# Patient Record
Sex: Female | Born: 1985
Health system: Southern US, Community
[De-identification: ages and names within clinical notes are randomized; demographics above are authoritative.]

## PROBLEM LIST (undated history)

## (undated) ENCOUNTER — Inpatient Hospital Stay (HOSPITAL_COMMUNITY): Payer: Self-pay

## (undated) DIAGNOSIS — K8020 Calculus of gallbladder without cholecystitis without obstruction: Secondary | ICD-10-CM

## (undated) DIAGNOSIS — F419 Anxiety disorder, unspecified: Secondary | ICD-10-CM

## (undated) DIAGNOSIS — O34219 Maternal care for unspecified type scar from previous cesarean delivery: Secondary | ICD-10-CM

## (undated) DIAGNOSIS — K5090 Crohn's disease, unspecified, without complications: Secondary | ICD-10-CM

## (undated) DIAGNOSIS — M2040 Other hammer toe(s) (acquired), unspecified foot: Secondary | ICD-10-CM

## (undated) DIAGNOSIS — Z98891 History of uterine scar from previous surgery: Secondary | ICD-10-CM

## (undated) DIAGNOSIS — R079 Chest pain, unspecified: Secondary | ICD-10-CM

## (undated) HISTORY — DX: Chest pain, unspecified: R07.9

## (undated) HISTORY — DX: Crohn's disease, unspecified, without complications: K50.90

---
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
## 2006-05-07 ENCOUNTER — Emergency Department (HOSPITAL_COMMUNITY): Admission: EM | Admit: 2006-05-07 | Discharge: 2006-05-08 | Payer: Self-pay | Admitting: Emergency Medicine

## 2006-05-09 ENCOUNTER — Emergency Department (HOSPITAL_COMMUNITY): Admission: EM | Admit: 2006-05-09 | Discharge: 2006-05-10 | Payer: Self-pay | Admitting: Emergency Medicine

## 2006-05-30 ENCOUNTER — Emergency Department (HOSPITAL_COMMUNITY): Admission: EM | Admit: 2006-05-30 | Discharge: 2006-05-30 | Payer: Self-pay | Admitting: Emergency Medicine

## 2006-09-07 ENCOUNTER — Emergency Department (HOSPITAL_COMMUNITY): Admission: EM | Admit: 2006-09-07 | Discharge: 2006-09-07 | Payer: Self-pay | Admitting: Emergency Medicine

## 2007-03-18 ENCOUNTER — Inpatient Hospital Stay (HOSPITAL_COMMUNITY): Admission: AD | Admit: 2007-03-18 | Discharge: 2007-03-18 | Payer: Self-pay | Admitting: Gynecology

## 2007-03-21 ENCOUNTER — Inpatient Hospital Stay (HOSPITAL_COMMUNITY): Admission: AD | Admit: 2007-03-21 | Discharge: 2007-03-21 | Payer: Self-pay | Admitting: Obstetrics & Gynecology

## 2009-06-21 ENCOUNTER — Inpatient Hospital Stay (HOSPITAL_COMMUNITY): Admission: AD | Admit: 2009-06-21 | Discharge: 2009-06-21 | Payer: Self-pay | Admitting: Obstetrics & Gynecology

## 2009-06-25 ENCOUNTER — Inpatient Hospital Stay (HOSPITAL_COMMUNITY): Admission: AD | Admit: 2009-06-25 | Discharge: 2009-06-25 | Payer: Self-pay | Admitting: Obstetrics & Gynecology

## 2009-06-25 ENCOUNTER — Emergency Department (HOSPITAL_COMMUNITY): Admission: EM | Admit: 2009-06-25 | Discharge: 2009-06-25 | Payer: Self-pay | Admitting: Emergency Medicine

## 2009-07-01 ENCOUNTER — Inpatient Hospital Stay (HOSPITAL_COMMUNITY): Admission: AD | Admit: 2009-07-01 | Discharge: 2009-07-01 | Payer: Self-pay | Admitting: Obstetrics & Gynecology

## 2009-07-08 ENCOUNTER — Inpatient Hospital Stay (HOSPITAL_COMMUNITY): Admission: AD | Admit: 2009-07-08 | Discharge: 2009-07-08 | Payer: Self-pay | Admitting: Obstetrics & Gynecology

## 2009-08-24 ENCOUNTER — Encounter: Payer: Self-pay | Admitting: Obstetrics and Gynecology

## 2009-08-24 ENCOUNTER — Ambulatory Visit: Payer: Self-pay | Admitting: Obstetrics and Gynecology

## 2009-08-24 LAB — CONVERTED CEMR LAB
Hemoglobin: 12.6 g/dL (ref 12.0–15.0)
Platelets: 268 10*3/uL (ref 150–400)
RBC: 4.37 M/uL (ref 3.87–5.11)
WBC: 6 10*3/uL (ref 4.0–10.5)
Yeast Wet Prep HPF POC: NONE SEEN
hCG, Beta Chain, Quant, S: <2 milliintl units/mL

## 2010-04-10 ENCOUNTER — Inpatient Hospital Stay (HOSPITAL_COMMUNITY): Admission: AD | Admit: 2010-04-10 | Discharge: 2010-04-10 | Payer: Self-pay | Admitting: Family Medicine

## 2010-05-01 ENCOUNTER — Inpatient Hospital Stay (HOSPITAL_COMMUNITY): Admission: AD | Admit: 2010-05-01 | Discharge: 2010-05-01 | Payer: Self-pay | Admitting: Obstetrics & Gynecology

## 2010-06-24 ENCOUNTER — Ambulatory Visit: Payer: Self-pay | Admitting: Nurse Practitioner

## 2010-06-24 ENCOUNTER — Inpatient Hospital Stay (HOSPITAL_COMMUNITY): Admission: AD | Admit: 2010-06-24 | Discharge: 2010-06-24 | Payer: Self-pay | Admitting: Obstetrics

## 2010-08-08 ENCOUNTER — Inpatient Hospital Stay (HOSPITAL_COMMUNITY): Admission: AD | Admit: 2010-08-08 | Discharge: 2010-08-08 | Payer: Self-pay | Admitting: Obstetrics

## 2010-09-02 ENCOUNTER — Inpatient Hospital Stay (HOSPITAL_COMMUNITY): Admission: AD | Admit: 2010-09-02 | Discharge: 2010-09-02 | Payer: Self-pay | Admitting: Obstetrics

## 2010-09-02 ENCOUNTER — Ambulatory Visit: Payer: Self-pay | Admitting: Gynecology

## 2010-10-09 ENCOUNTER — Inpatient Hospital Stay (HOSPITAL_COMMUNITY): Admission: AD | Admit: 2010-10-09 | Discharge: 2010-10-09 | Payer: Self-pay | Admitting: Obstetrics

## 2010-10-10 ENCOUNTER — Inpatient Hospital Stay (HOSPITAL_COMMUNITY): Admission: AD | Admit: 2010-10-10 | Discharge: 2010-10-12 | Payer: Self-pay | Admitting: Obstetrics

## 2011-02-28 LAB — CBC
HCT: 30.6 % — ABNORMAL LOW (ref 36.0–46.0)
HCT: 33.9 % — ABNORMAL LOW (ref 36.0–46.0)
Hemoglobin: 10.3 g/dL — ABNORMAL LOW (ref 12.0–15.0)
Hemoglobin: 11.5 g/dL — ABNORMAL LOW (ref 12.0–15.0)
MCH: 30.6 pg (ref 26.0–34.0)
MCH: 30.6 pg (ref 26.0–34.0)
MCHC: 33.7 g/dL (ref 30.0–36.0)
MCV: 90.3 fL (ref 78.0–100.0)
MCV: 90.8 fL (ref 78.0–100.0)
Platelets: 178 10*3/uL (ref 150–400)
Platelets: 205 10*3/uL (ref 150–400)
RBC: 3.37 MIL/uL — ABNORMAL LOW (ref 3.87–5.11)
RBC: 3.76 MIL/uL — ABNORMAL LOW (ref 3.87–5.11)
RDW: 13.2 % (ref 11.5–15.5)
WBC: 12.3 10*3/uL — ABNORMAL HIGH (ref 4.0–10.5)
WBC: 9.2 10*3/uL (ref 4.0–10.5)

## 2011-03-01 LAB — URINALYSIS, ROUTINE W REFLEX MICROSCOPIC
Glucose, UA: NEGATIVE mg/dL
Specific Gravity, Urine: 1.015 (ref 1.005–1.030)
pH: 6.5 (ref 5.0–8.0)

## 2011-03-01 LAB — GC/CHLAMYDIA PROBE AMP, GENITAL: GC Probe Amp, Genital: NEGATIVE

## 2011-03-01 LAB — WET PREP, GENITAL: Trich, Wet Prep: NONE SEEN

## 2011-03-02 LAB — URINALYSIS, ROUTINE W REFLEX MICROSCOPIC
Glucose, UA: NEGATIVE mg/dL
Ketones, ur: >80 mg/dL — AB
Protein, ur: NEGATIVE mg/dL
Urobilinogen, UA: 0.2 mg/dL (ref 0.0–1.0)
pH: 6 (ref 5.0–8.0)

## 2011-03-02 LAB — WET PREP, GENITAL
Trich, Wet Prep: NONE SEEN
Yeast Wet Prep HPF POC: NONE SEEN

## 2011-03-02 LAB — FETAL FIBRONECTIN: Fetal Fibronectin: NEGATIVE

## 2011-03-04 LAB — CBC
Hemoglobin: 10.1 g/dL — ABNORMAL LOW (ref 12.0–15.0)
MCH: 31.4 pg (ref 26.0–34.0)
MCV: 93.5 fL (ref 78.0–100.0)
RBC: 3.22 MIL/uL — ABNORMAL LOW (ref 3.87–5.11)
WBC: 8.5 10*3/uL (ref 4.0–10.5)

## 2011-03-04 LAB — URINALYSIS, ROUTINE W REFLEX MICROSCOPIC
Bilirubin Urine: NEGATIVE
Hgb urine dipstick: NEGATIVE
Ketones, ur: NEGATIVE mg/dL
Nitrite: NEGATIVE
Protein, ur: NEGATIVE mg/dL
Specific Gravity, Urine: >1.03 — ABNORMAL HIGH (ref 1.005–1.030)

## 2011-03-04 LAB — WET PREP, GENITAL: Clue Cells Wet Prep HPF POC: NONE SEEN

## 2011-03-04 LAB — GC/CHLAMYDIA PROBE AMP, GENITAL
Chlamydia, DNA Probe: NEGATIVE
GC Probe Amp, Genital: NEGATIVE

## 2011-03-05 LAB — URINALYSIS, ROUTINE W REFLEX MICROSCOPIC
Bilirubin Urine: NEGATIVE
Hgb urine dipstick: NEGATIVE
Ketones, ur: >80 mg/dL — AB
Specific Gravity, Urine: >1.03 — ABNORMAL HIGH (ref 1.005–1.030)
pH: 6 (ref 5.0–8.0)

## 2011-03-05 LAB — CBC
Hemoglobin: 11.8 g/dL — ABNORMAL LOW (ref 12.0–15.0)
MCHC: 34.6 g/dL (ref 30.0–36.0)
Platelets: 225 10*3/uL (ref 150–400)
RDW: 12.3 % (ref 11.5–15.5)

## 2011-03-05 LAB — ABO/RH: ABO/RH(D): A POS

## 2011-03-05 LAB — GC/CHLAMYDIA PROBE AMP, GENITAL: Chlamydia, DNA Probe: NEGATIVE

## 2011-03-05 LAB — WET PREP, GENITAL

## 2011-03-06 LAB — CBC
Hemoglobin: 11.9 g/dL — ABNORMAL LOW (ref 12.0–15.0)
MCV: 92.1 fL (ref 78.0–100.0)
RBC: 3.86 MIL/uL — ABNORMAL LOW (ref 3.87–5.11)
WBC: 8.6 10*3/uL (ref 4.0–10.5)

## 2011-03-06 LAB — URINALYSIS, ROUTINE W REFLEX MICROSCOPIC
Hgb urine dipstick: NEGATIVE
Nitrite: NEGATIVE
Protein, ur: NEGATIVE mg/dL
Specific Gravity, Urine: 1.01 (ref 1.005–1.030)
Urobilinogen, UA: 0.2 mg/dL (ref 0.0–1.0)

## 2011-03-06 LAB — WET PREP, GENITAL: Trich, Wet Prep: NONE SEEN

## 2011-03-06 LAB — POCT PREGNANCY, URINE: Preg Test, Ur: POSITIVE

## 2011-03-25 LAB — CBC
HCT: 33.5 % — ABNORMAL LOW (ref 36.0–46.0)
Hemoglobin: 11.7 g/dL — ABNORMAL LOW (ref 12.0–15.0)
Hemoglobin: 11.8 g/dL — ABNORMAL LOW (ref 12.0–15.0)
MCHC: 33.8 g/dL (ref 30.0–36.0)
MCHC: 34.9 g/dL (ref 30.0–36.0)
MCV: 88.8 fL (ref 78.0–100.0)
MCV: 89.1 fL (ref 78.0–100.0)
Platelets: 216 10*3/uL (ref 150–400)
RBC: 3.77 MIL/uL — ABNORMAL LOW (ref 3.87–5.11)
RBC: 3.92 MIL/uL (ref 3.87–5.11)
RDW: 14.2 % (ref 11.5–15.5)
RDW: 14.2 % (ref 11.5–15.5)
WBC: 6.3 10*3/uL (ref 4.0–10.5)

## 2011-03-25 LAB — BASIC METABOLIC PANEL
CO2: 19 mEq/L (ref 19–32)
Calcium: 9.6 mg/dL (ref 8.4–10.5)
Chloride: 109 mEq/L (ref 96–112)
Creatinine, Ser: 0.58 mg/dL (ref 0.4–1.2)
GFR calc Af Amer: >60 mL/min (ref >60)
Glucose, Bld: 94 mg/dL (ref 70–99)
Sodium: 138 mEq/L (ref 135–145)

## 2011-03-25 LAB — WET PREP, GENITAL
Trich, Wet Prep: NONE SEEN
Trich, Wet Prep: NONE SEEN
Yeast Wet Prep HPF POC: NONE SEEN
Yeast Wet Prep HPF POC: NONE SEEN

## 2011-03-25 LAB — URINALYSIS, ROUTINE W REFLEX MICROSCOPIC
Bilirubin Urine: NEGATIVE
Glucose, UA: NEGATIVE mg/dL
Glucose, UA: NEGATIVE mg/dL
Ketones, ur: NEGATIVE mg/dL
Nitrite: NEGATIVE
Protein, ur: NEGATIVE mg/dL
Specific Gravity, Urine: 1.025 (ref 1.005–1.030)
Urobilinogen, UA: 1 mg/dL (ref 0.0–1.0)
pH: 6 (ref 5.0–8.0)

## 2011-03-25 LAB — DIFFERENTIAL
Basophils Absolute: 0 10*3/uL (ref 0.0–0.1)
Basophils Relative: 0 % (ref 0–1)
Eosinophils Absolute: 0 10*3/uL (ref 0.0–0.7)
Monocytes Absolute: 0.5 10*3/uL (ref 0.1–1.0)
Monocytes Relative: 4 % (ref 3–12)
Neutro Abs: 9 10*3/uL — ABNORMAL HIGH (ref 1.7–7.7)

## 2011-03-25 LAB — GC/CHLAMYDIA PROBE AMP, GENITAL
Chlamydia, DNA Probe: NEGATIVE
GC Probe Amp, Genital: NEGATIVE
GC Probe Amp, Genital: NEGATIVE

## 2011-03-25 LAB — ABO/RH: ABO/RH(D): A POS

## 2011-03-25 LAB — COMPREHENSIVE METABOLIC PANEL
Albumin: 4.4 g/dL (ref 3.5–5.2)
BUN: 4 mg/dL — ABNORMAL LOW (ref 6–23)
Creatinine, Ser: 0.66 mg/dL (ref 0.4–1.2)
Glucose, Bld: 78 mg/dL (ref 70–99)
Total Protein: 7.4 g/dL (ref 6.0–8.3)

## 2011-03-25 LAB — HCG, QUANTITATIVE, PREGNANCY
hCG, Beta Chain, Quant, S: 115 m[IU]/mL — ABNORMAL HIGH (ref <5)
hCG, Beta Chain, Quant, S: 43615 m[IU]/mL — ABNORMAL HIGH (ref <5)

## 2011-03-25 LAB — POCT PREGNANCY, URINE: Preg Test, Ur: POSITIVE

## 2011-03-25 LAB — URINE MICROSCOPIC-ADD ON

## 2012-01-28 ENCOUNTER — Inpatient Hospital Stay (HOSPITAL_COMMUNITY)
Admission: AD | Admit: 2012-01-28 | Discharge: 2012-01-28 | Disposition: A | Discharge status: Home / Self Care | Payer: Medicaid Other | Source: Ambulatory Visit | Attending: Obstetrics | Admitting: Obstetrics

## 2012-01-28 ENCOUNTER — Inpatient Hospital Stay (HOSPITAL_COMMUNITY): Payer: Medicaid Other

## 2012-01-28 ENCOUNTER — Encounter (HOSPITAL_COMMUNITY): Payer: Self-pay | Admitting: *Deleted

## 2012-01-28 DIAGNOSIS — O26899 Other specified pregnancy related conditions, unspecified trimester: Secondary | ICD-10-CM

## 2012-01-28 DIAGNOSIS — R51 Headache: Secondary | ICD-10-CM | POA: Insufficient documentation

## 2012-01-28 DIAGNOSIS — R5381 Other malaise: Secondary | ICD-10-CM | POA: Insufficient documentation

## 2012-01-28 DIAGNOSIS — O99891 Other specified diseases and conditions complicating pregnancy: Secondary | ICD-10-CM | POA: Insufficient documentation

## 2012-01-28 DIAGNOSIS — Z1389 Encounter for screening for other disorder: Secondary | ICD-10-CM

## 2012-01-28 DIAGNOSIS — N898 Other specified noninflammatory disorders of vagina: Secondary | ICD-10-CM | POA: Insufficient documentation

## 2012-01-28 DIAGNOSIS — R102 Pelvic and perineal pain: Secondary | ICD-10-CM

## 2012-01-28 DIAGNOSIS — Z3490 Encounter for supervision of normal pregnancy, unspecified, unspecified trimester: Secondary | ICD-10-CM

## 2012-01-28 DIAGNOSIS — O98819 Other maternal infectious and parasitic diseases complicating pregnancy, unspecified trimester: Secondary | ICD-10-CM | POA: Insufficient documentation

## 2012-01-28 DIAGNOSIS — N949 Unspecified condition associated with female genital organs and menstrual cycle: Secondary | ICD-10-CM | POA: Insufficient documentation

## 2012-01-28 DIAGNOSIS — R109 Unspecified abdominal pain: Secondary | ICD-10-CM | POA: Insufficient documentation

## 2012-01-28 DIAGNOSIS — A599 Trichomoniasis, unspecified: Secondary | ICD-10-CM | POA: Insufficient documentation

## 2012-01-28 LAB — URINALYSIS, ROUTINE W REFLEX MICROSCOPIC
Bilirubin Urine: NEGATIVE
Glucose, UA: NEGATIVE mg/dL
Hgb urine dipstick: NEGATIVE
Protein, ur: NEGATIVE mg/dL
Urobilinogen, UA: 2 mg/dL — ABNORMAL HIGH (ref 0.0–1.0)

## 2012-01-28 LAB — CBC
HCT: 34.1 % — ABNORMAL LOW (ref 36.0–46.0)
Hemoglobin: 11.5 g/dL — ABNORMAL LOW (ref 12.0–15.0)
RBC: 3.98 MIL/uL (ref 3.87–5.11)

## 2012-01-28 LAB — ABO/RH: ABO/RH(D): A POS

## 2012-01-28 LAB — DIFFERENTIAL
Lymphocytes Relative: 47 % — ABNORMAL HIGH (ref 12–46)
Lymphs Abs: 3.6 10*3/uL (ref 0.7–4.0)
Monocytes Absolute: 0.5 10*3/uL (ref 0.1–1.0)
Monocytes Relative: 7 % (ref 3–12)
Neutro Abs: 3.5 10*3/uL (ref 1.7–7.7)
Neutrophils Relative %: 45 % (ref 43–77)

## 2012-01-28 LAB — WET PREP, GENITAL: Clue Cells Wet Prep HPF POC: NONE SEEN

## 2012-01-28 MED ORDER — METRONIDAZOLE 500 MG PO TABS: 500.0000 mg | ORAL_TABLET | Freq: Two times a day (BID) | ORAL | Status: AC

## 2012-01-28 NOTE — Discharge Instructions (Signed)
Trichomoniasis Trichomoniasis is an infection, caused by the Trichomonas organism, that affects both women and men. In women, the outer female genitalia and the vagina are affected. In men, the penis is mainly affected, but the prostate and other reproductive organs can also be involved. Trichomoniasis is a sexually transmitted disease (STD) and is most often passed to another person through sexual contact. The majority of people who get trichomoniasis do so from a sexual encounter and are also at risk for other STDs. CAUSES   Sexual intercourse with an infected partner.   It can be present in swimming pools or hot tubs.  SYMPTOMS   Abnormal gray-green frothy vaginal discharge in women.   Vaginal itching and irritation in women.   Itching and irritation of the area outside the vagina in women.   Penile discharge with or without pain in males.   Inflammation of the urethra (urethritis), causing painful urination.   Bleeding after sexual intercourse.  RELATED COMPLICATIONS  Pelvic inflammatory disease.   Infection of the uterus (endometritis).   Infertility.   Tubal (ectopic) pregnancy.   It can be associated with other STDs, including gonorrhea and chlamydia, hepatitis B, and HIV.  COMPLICATIONS DURING PREGNANCY  Early (premature) delivery.   Premature rupture of the membranes (PROM).   Low birth weight.  DIAGNOSIS   Visualization of Trichomonas under the microscope from the vagina discharge.   Ph of the vagina greater than 4.5, tested with a test tape.   Trich Rapid Test.   Culture of the organism, but this is not usually needed.   It may be found on a Pap test.   Having a "strawberry cervix,"which means the cervix looks very red like a strawberry.  TREATMENT   You may be given medication to fight the infection. Inform your caregiver if you could be or are pregnant. Some medications used to treat the infection should not be taken during pregnancy.    Over-the-counter medications or creams to decrease itching or irritation may be recommended.   Your sexual partner will need to be treated if infected.  HOME CARE INSTRUCTIONS   Take all medication prescribed by your caregiver.   Take over-the-counter medication for itching or irritation as directed by your caregiver.   Do not have sexual intercourse while you have the infection.   Do not douche or wear tampons.   Discuss your infection with your partner, as your partner may have acquired the infection from you. Or, your partner may have been the person who transmitted the infection to you.   Have your sex partner examined and treated if necessary.   Practice safe, informed, and protected sex.   See your caregiver for other STD testing.  SEEK MEDICAL CARE IF:   You still have symptoms after you finish the medication.   You have an oral temperature above 102 F (38.9 C).   You develop belly (abdominal) pain.   You have pain when you urinate.   You have bleeding after sexual intercourse.   You develop a rash.   The medication makes you sick or makes you throw up (vomit).  Document Released: 05/29/2001 Document Revised: 08/15/2011 Document Reviewed: 06/24/2009 [employer] Patient Information 2012 [employer]. 

## 2012-01-28 NOTE — ED Provider Notes (Signed)
History     CSN: [id number]  Arrival date & time 01/28/12  1855   None     Chief Complaint  Patient presents with  . Abdominal Pain    HPI Katherine Lindsey is a 26 y.o. female who presents to [facility] for low abdominal pain that started 2 days ago. Was getting Depo Provera for birth control, last injection was 9/13. Irregular periods so not sure how far pregnant. Positive HPT last week.  Current sex partner x 2 years. No history of STD's. Last pap smear less than one year and was normal. Plans care with Dr. Gaynell Face.  History reviewed. No pertinent past medical history.  History reviewed. No pertinent past surgical history.  History reviewed. No pertinent family history.  History  Substance Use Topics  . Smoking status: Never Smoker   . Smokeless tobacco: Never Used  . Alcohol Use: No    OB History    Grav Para Term Preterm Abortions TAB SAB Ect Mult Living   3 1   1  1   1       Review of Systems  Constitutional: Positive for fatigue. Negative for fever, chills and diaphoresis.  HENT: Negative for ear pain, congestion, sore throat, facial swelling, neck pain, neck stiffness, dental problem and sinus pressure.   Eyes: Negative for photophobia, pain and discharge.  Respiratory: Negative for cough, chest tightness and wheezing.   Cardiovascular: Negative.   Gastrointestinal: Positive for abdominal pain. Negative for nausea, vomiting, diarrhea, constipation and abdominal distention.  Genitourinary: Positive for vaginal discharge and pelvic pain. Negative for dysuria, urgency, frequency, flank pain and difficulty urinating. Vaginal bleeding: spotting yesterday.  Musculoskeletal: Negative for myalgias, back pain and gait problem.  Skin: Negative for color change and rash.  Neurological: Positive for headaches. Negative for dizziness, speech difficulty, weakness, light-headedness and numbness.  Psychiatric/Behavioral: Negative for confusion and agitation. The patient is not  nervous/anxious.     Allergies  Review of patient's allergies indicates no known allergies.  Home Medications  No current outpatient prescriptions on file.  BP 123/59  Pulse 70  Temp(Src) 98.9 F (37.2 C) (Oral)  Resp 18  Ht 5\' 8"  (1.727 m)  Wt 155 lb 4 oz (70.421 kg)  BMI 23.61 kg/m2  LMP 12/24/2011  Physical Exam  Nursing note and vitals reviewed. Constitutional: She is oriented to person, place, and time. She appears well-developed and well-nourished.  HENT:  Head: Normocephalic.  Eyes: EOM are normal.  Neck: Neck supple.  Cardiovascular: Normal rate.   Pulmonary/Chest: Effort normal.  Abdominal: Soft. There is no tenderness.       Unable to reproduce the pain the patient has experienced earlier.  Genitourinary:       External genitalia without lesions. White discharge vaginal vault. Cervix closed, long, no CMT, no adnexal tenderness or mass palpated. Uterus slightly enlarged.  Musculoskeletal: Normal range of motion.  Neurological: She is alert and oriented to person, place, and time. No cranial nerve deficit.  Skin: Skin is warm and dry.  Psychiatric: She has a normal mood and affect. Her behavior is normal. Judgment and thought content normal.   Assessment: First trimester pregnancy with abdominal pain  Plan:  Labs, ultrasound    ED Course: care turned over to [facility], [facility] @ 21:37 pm. Patient awaiting ultrasound.  Procedures  MDM          Kerrie Buffalo, NP 01/28/12 2138  I have assumed care of this pt from [facility], FNP.  US shows  single IUP with cardiac activity at 6.0wks with an EDC of 09/22/12.  Results for orders placed during the hospital encounter of 01/28/12 (from the past 24 hour(s))  URINALYSIS, ROUTINE W REFLEX MICROSCOPIC     Status: Abnormal   Collection Time   01/28/12  7:06 PM      Component Value Range   Color, Urine YELLOW  YELLOW    APPearance CLEAR  CLEAR    Specific Gravity, Urine 1.020  1.005 - 1.030    pH 7.5  5.0 - 8.0     Glucose, UA NEGATIVE  NEGATIVE (mg/dL)   Hgb urine dipstick NEGATIVE  NEGATIVE    Bilirubin Urine NEGATIVE  NEGATIVE    Ketones, ur NEGATIVE  NEGATIVE (mg/dL)   Protein, ur NEGATIVE  NEGATIVE (mg/dL)   Urobilinogen, UA 2.0 (*) 0.0 - 1.0 (mg/dL)   Nitrite NEGATIVE  NEGATIVE    Leukocytes, UA NEGATIVE  NEGATIVE   POCT PREGNANCY, URINE     Status: Abnormal   Collection Time   01/28/12  7:51 PM      Component Value Range   Preg Test, Ur POSITIVE (*) NEGATIVE   WET PREP, GENITAL     Status: Abnormal   Collection Time   01/28/12  9:00 PM      Component Value Range   Yeast Wet Prep HPF POC NONE SEEN  NONE SEEN    Trich, Wet Prep MANY (*) NONE SEEN    Clue Cells Wet Prep HPF POC NONE SEEN  NONE SEEN    WBC, Wet Prep HPF POC MANY (*) NONE SEEN   HCG, QUANTITATIVE, PREGNANCY     Status: Abnormal   Collection Time   01/28/12  9:07 PM      Component Value Range   hCG, Beta Chain, Quant, S 11820 (*) <5 (mIU/mL)  CBC     Status: Abnormal   Collection Time   01/28/12  9:12 PM      Component Value Range   WBC 7.7  4.0 - 10.5 (K/uL)   RBC 3.98  3.87 - 5.11 (MIL/uL)   Hemoglobin 11.5 (*) 12.0 - 15.0 (g/dL)   HCT 72.5 (*) 36.6 - 46.0 (%)   MCV 85.7  78.0 - 100.0 (fL)   MCH 28.9  26.0 - 34.0 (pg)   MCHC 33.7  30.0 - 36.0 (g/dL)   RDW 44.0  34.7 - 42.5 (%)   Platelets 241  150 - 400 (K/uL)  DIFFERENTIAL     Status: Abnormal   Collection Time   01/28/12  9:12 PM      Component Value Range   Neutrophils Relative 45  43 - 77 (%)   Neutro Abs 3.5  1.7 - 7.7 (K/uL)   Lymphocytes Relative 47 (*) 12 - 46 (%)   Lymphs Abs 3.6  0.7 - 4.0 (K/uL)   Monocytes Relative 7  3 - 12 (%)   Monocytes Absolute 0.5  0.1 - 1.0 (K/uL)   Eosinophils Relative 1  0 - 5 (%)   Eosinophils Absolute 0.1  0.0 - 0.7 (K/uL)   Basophils Relative 0  0 - 1 (%)   Basophils Absolute 0.0  0.0 - 0.1 (K/uL)  ABO/RH     Status: Normal   Collection Time   01/28/12  9:12 PM      Component Value Range   ABO/RH(D) A POS         A/P: IUP: discussed with pt at length. She will begin care with Dr. Gaynell Face. Discussed  diet, activity, risks, and precautions.  Trich: discussed with pt at length. Will tx with Flagyl. Warned of antabuse reaction. Discussed safe sex practices.  Henrietta Hoover, [place] 01/28/12 2215

## 2012-01-28 NOTE — Progress Notes (Signed)
Pt states she had +UPT.

## 2012-01-28 NOTE — Progress Notes (Signed)
Pt c/o lower abdominal cramping x1 week.

## 2012-02-15 HISTORY — PX: INDUCED ABORTION: SHX677

## 2012-03-23 ENCOUNTER — Emergency Department (HOSPITAL_COMMUNITY)
Admission: EM | Admit: 2012-03-23 | Discharge: 2012-03-23 | Disposition: A | Discharge status: Home / Self Care | Payer: Medicaid Other | Attending: Emergency Medicine | Admitting: Emergency Medicine

## 2012-03-23 ENCOUNTER — Emergency Department (HOSPITAL_COMMUNITY): Payer: Medicaid Other

## 2012-03-23 ENCOUNTER — Encounter (HOSPITAL_COMMUNITY): Payer: Self-pay | Admitting: *Deleted

## 2012-03-23 DIAGNOSIS — N760 Acute vaginitis: Secondary | ICD-10-CM | POA: Insufficient documentation

## 2012-03-23 DIAGNOSIS — B9689 Other specified bacterial agents as the cause of diseases classified elsewhere: Secondary | ICD-10-CM | POA: Insufficient documentation

## 2012-03-23 DIAGNOSIS — A499 Bacterial infection, unspecified: Secondary | ICD-10-CM | POA: Insufficient documentation

## 2012-03-23 DIAGNOSIS — R109 Unspecified abdominal pain: Secondary | ICD-10-CM | POA: Insufficient documentation

## 2012-03-23 DIAGNOSIS — IMO0001 Reserved for inherently not codable concepts without codable children: Secondary | ICD-10-CM | POA: Insufficient documentation

## 2012-03-23 LAB — WET PREP, GENITAL
Trich, Wet Prep: NONE SEEN
Yeast Wet Prep HPF POC: NONE SEEN

## 2012-03-23 MED ORDER — ACETAMINOPHEN 325 MG PO TABS
650.0000 mg | ORAL_TABLET | Freq: Once | ORAL | Status: AC
  Administered 2012-03-23: 650 mg via ORAL
  Filled 2012-03-23: qty 2

## 2012-03-23 MED ORDER — METRONIDAZOLE 500 MG PO TABS: 500.0000 mg | ORAL_TABLET | Freq: Two times a day (BID) | ORAL | Status: AC

## 2012-03-23 NOTE — ED Provider Notes (Signed)
Medical screening examination/treatment/procedure(s) were performed by non-physician practitioner and as supervising physician I was immediately available for consultation/collaboration.  Ethelda Chick, MD 03/23/12 [phone]

## 2012-03-23 NOTE — ED Provider Notes (Signed)
0700  port received from [facility] PA. Patient is awaiting ultrasound of the abdomen endovaginal ultrasound to evaluate post abortion 2 weeks ago. Patient of Dr. Gaynell Face coming in with lower abdominal pain and nonspecific pain to her upper extremities. GC chlamydia also pending. We'll decide patient when results come.  10:30 patient diagnosed with bacterial vaginitis. Given Rx for Flagyl. Ultrasound results discussed with Dr. Patria Mane. Patient will followup with Dr. Gaynell Face as soon as possible. For pain.  Jethro Bastos, NP 03/23/12 1144

## 2012-03-23 NOTE — ED Provider Notes (Addendum)
History     CSN: [id number]  Arrival date & time 03/23/12  0130   First MD Initiated Contact with Patient 03/23/12 [phone]      Chief Complaint  Patient presents with  . Abdominal Pain    "pt states she had an abortion two weeks ago"  . Generalized Body Aches    (Consider location/radiation/quality/duration/timing/severity/associated sxs/prior treatment) HPI Comments: Patient reports that she had an elective abortion 2 weeks ago done at [facility].  She comes in today stating that she has had intermittent abdominal pain for the past 3 days.  She describes the pain as a "tingling" pain.  She reports that she had some vaginal bleeding and passage of clots for the first 4-5 days after the abortion, but no vaginal bleeding since that time.  She also reports yellowish color vaginal discharge over the past couple of days.  Denies nausea, vomiting, and diarrhea.  Denies dysuria or hematuria.  Denies fever.  She is also complaining of generalized body aches over the past couple of days.  Patient is a 26 y.o. female presenting with abdominal pain. The history is provided by the patient.  Abdominal Pain The primary symptoms of the illness include abdominal pain and vaginal discharge. The primary symptoms of the illness do not include fever, nausea, vomiting, dysuria or vaginal bleeding.  The vaginal discharge is not associated with dysuria.  Symptoms associated with the illness do not include chills or hematuria.    No past medical history on file.  Past Surgical History  Procedure Date  . Induced abortion     2 weeks ago    History reviewed. No pertinent family history.  History  Substance Use Topics  . Smoking status: Never Smoker   . Smokeless tobacco: Never Used  . Alcohol Use: No    OB History    Grav Para Term Preterm Abortions TAB SAB Ect Mult Living   4 1   1  1   1       Review of Systems  Constitutional: Negative for fever and chills.  Gastrointestinal: Positive  for abdominal pain and abdominal distention. Negative for nausea and vomiting.  Genitourinary: Positive for vaginal discharge. Negative for dysuria, hematuria and vaginal bleeding.  Neurological: Negative for dizziness, syncope and light-headedness.    Allergies  Review of patient's allergies indicates no known allergies.  Home Medications   Current Outpatient Rx  Name Route Sig Dispense Refill  . IBUPROFEN 200 MG PO TABS Oral Take 400 mg by mouth every 6 (six) hours as needed. Patient used this medication for stomach pain, and a headache.      BP 120/71  Pulse 62  Temp(Src) 98.4 F (36.9 C) (Oral)  Resp 20  SpO2 98%  LMP 03/17/2012  Breastfeeding? Unknown  Physical Exam  Nursing note and vitals reviewed. Constitutional: She appears well-developed and well-nourished. No distress.  HENT:  Head: Normocephalic and atraumatic.  Cardiovascular: Normal rate, regular rhythm and normal heart sounds.   Pulmonary/Chest: Effort normal and breath sounds normal. No respiratory distress.  Abdominal: Soft. Bowel sounds are normal. She exhibits no distension and no mass. There is no tenderness. There is no rebound and no guarding.  Genitourinary: Uterus normal. Uterus is not tender. Cervix exhibits discharge. Cervix exhibits no motion tenderness. Right adnexum displays no mass, no tenderness and no fullness. Left adnexum displays no mass and no tenderness.       Cervical os closed  Musculoskeletal: Normal range of motion.  Neurological: She is alert.  Skin: Skin is warm and dry. She is not diaphoretic.  Psychiatric: She has a normal mood and affect.    ED Course  Procedures (including critical care time)   Labs Reviewed  GC/CHLAMYDIA PROBE AMP, GENITAL  WET PREP, GENITAL   No results found.   No diagnosis found.  7:25 AM Patient signed out to Remi Haggard who assumes care of patient.  Patient awaiting ultrasound.  MDM  Patient with intermittent abdominal pain s/p elective  abortion.  Pelvic exam unremarkable.  Patient currently awaiting ultrasound to evaluate for any products of conception.        Pascal Lux Holstein, PA-C 03/23/12 0716  Magnus Sinning, PA-C 03/23/12 0725

## 2012-03-23 NOTE — ED Provider Notes (Signed)
11:02 AM The patient presented with complaints of aches everywhere and tingling in her fingertips and occasional intermittent abdominal pain.  She is nontender in her abdomen currently.  She reports no abnormal bleeding.  She has an OB/GYN.  Her ultrasound findings today are very nonspecific and this does not appear clinically to be retained products of conception as her abortion was March 18 and she is otherwise well-appearing.  Her vital signs are normal.  She will call her OB/GYN for followup in the next several days.  She's been instructed to return to the ER for new or worsening symptoms including high fevers or development of worsening abdominal pain.  Results for orders placed during the hospital encounter of 03/23/12  WET PREP, GENITAL      Component Value Range   Yeast Wet Prep HPF POC NONE SEEN  NONE SEEN    Trich, Wet Prep NONE SEEN  NONE SEEN    Clue Cells Wet Prep HPF POC MANY (*) NONE SEEN    WBC, Wet Prep HPF POC FEW (*) NONE SEEN    US Transvaginal Non-ob  03/23/2012  *RADIOLOGY REPORT*  Clinical Data: Abdominal pain.  Elective abortion 2 weeks ago.  TRANSABDOMINAL AND TRANSVAGINAL ULTRASOUND OF PELVIS  Technique:  Both transabdominal and transvaginal ultrasound examinations of the pelvis were performed.  Transabdominal technique was performed for global imaging of the pelvis including uterus, ovaries, adnexal regions, and pelvic cul-de-sac.  It was necessary to proceed with endovaginal exam following the transabdominal exam to visualize the ovaries and endometrium.  Comparison:  None.  Findings: Uterus:  The uterus appears normal measuring 9.9 x 4.3 x 5.2 cm.  Endometrium: The endometrium measures 18 mm in thickness. Echogenic debris and fluid is noted within the endometrial cavity. This measures 0.8 x 1.6 cm.  Right ovary: Normal appearance/no adnexal mass  Left ovary: Normal appearance/no adnexal mass  Other Findings:  No free fluid  IMPRESSION:  1.  There is a low density fluid and  echogenic material within the uterine cavity.  Cannot rule out retained products of conception. 2.  Examination is otherwise normal.  Original Report Authenticated By: Rosealee Albee, M.D.   US Pelvis Complete  03/23/2012  *RADIOLOGY REPORT*  Clinical Data: Abdominal pain.  Elective abortion 2 weeks ago.  TRANSABDOMINAL AND TRANSVAGINAL ULTRASOUND OF PELVIS  Technique:  Both transabdominal and transvaginal ultrasound examinations of the pelvis were performed.  Transabdominal technique was performed for global imaging of the pelvis including uterus, ovaries, adnexal regions, and pelvic cul-de-sac.  It was necessary to proceed with endovaginal exam following the transabdominal exam to visualize the ovaries and endometrium.  Comparison:  None.  Findings: Uterus:  The uterus appears normal measuring 9.9 x 4.3 x 5.2 cm.  Endometrium: The endometrium measures 18 mm in thickness. Echogenic debris and fluid is noted within the endometrial cavity. This measures 0.8 x 1.6 cm.  Right ovary: Normal appearance/no adnexal mass  Left ovary: Normal appearance/no adnexal mass  Other Findings:  No free fluid  IMPRESSION:  1.  There is a low density fluid and echogenic material within the uterine cavity.  Cannot rule out retained products of conception. 2.  Examination is otherwise normal.  Original Report Authenticated By: Rosealee Albee, M.D.    Lyanne Co, MD 03/23/12 1104

## 2012-03-23 NOTE — Discharge Instructions (Signed)
Ms Fleek your u/s today was normal.  The labs show you have bacterial vaginosis.  We will treat this with flagyl.  Follow up with the gyn of your choice.  Free std clinic at the [facility] or [facility] in 5-7 days to be rechecked.  Do not have intercourse until rechecked. Have partner checked at clinic as well.    Bacterial Vaginosis Bacterial vaginosis is an infection of the vagina. A healthy vagina has many kinds of good germs (bacteria). Sometimes the number of good germs can change. This allows bad germs to move in and cause an infection. You may be given medicine (antibiotics) to treat the infection. Or, you may not need treatment at all. HOME CARE  Take your medicine as told. Finish them even if you start to feel better.   Do not have sex until you finish your medicine.   Do not douche.   Practice safe sex.   Tell your sex partner that you have an infection. They should see their doctor for treatment if they have problems.  GET HELP RIGHT AWAY IF:  You do not get better after 3 days of treatment.   You have grey fluid (discharge) coming from your vagina.   You have pain.   You have a temperature of 102 F (38.9 C) or higher.  MAKE SURE YOU:   Understand these instructions.   Will watch your condition.   Will get help right away if you are not doing well or get worse.  Document Released: 09/11/2008 Document Revised: 11/22/2011 Document Reviewed: 09/11/2008 [facility] Patient Information 2012 [place], [place].

## 2012-03-23 NOTE — ED Provider Notes (Signed)
Medical screening examination/treatment/procedure(s) were conducted as a shared visit with non-physician practitioner(s) and myself.  I personally evaluated the patient during the encounter    Please see my other note  Lyanne Co, MD 03/23/12 1148

## 2012-03-23 NOTE — ED Provider Notes (Signed)
Medical screening examination/treatment/procedure(s) were performed by non-physician practitioner and as supervising physician I was immediately available for consultation/collaboration.  Ethelda Chick, MD 03/23/12 [phone]

## 2012-03-24 ENCOUNTER — Encounter (HOSPITAL_COMMUNITY): Payer: Self-pay | Admitting: *Deleted

## 2012-03-24 ENCOUNTER — Emergency Department (HOSPITAL_COMMUNITY)
Admission: EM | Admit: 2012-03-24 | Discharge: 2012-03-24 | Disposition: A | Discharge status: Home / Self Care | Payer: Medicaid Other | Attending: Emergency Medicine | Admitting: Emergency Medicine

## 2012-03-24 DIAGNOSIS — M549 Dorsalgia, unspecified: Secondary | ICD-10-CM

## 2012-03-24 DIAGNOSIS — M545 Low back pain, unspecified: Secondary | ICD-10-CM | POA: Insufficient documentation

## 2012-03-24 DIAGNOSIS — M546 Pain in thoracic spine: Secondary | ICD-10-CM | POA: Insufficient documentation

## 2012-03-24 LAB — GC/CHLAMYDIA PROBE AMP, GENITAL
Chlamydia, DNA Probe: NEGATIVE
GC Probe Amp, Genital: NEGATIVE

## 2012-03-24 LAB — URINE MICROSCOPIC-ADD ON

## 2012-03-24 LAB — HCG, QUANTITATIVE, PREGNANCY: hCG, Beta Chain, Quant, S: 12 m[IU]/mL — ABNORMAL HIGH (ref <5)

## 2012-03-24 LAB — URINALYSIS, ROUTINE W REFLEX MICROSCOPIC
Glucose, UA: NEGATIVE mg/dL
Hgb urine dipstick: NEGATIVE
Ketones, ur: 15 mg/dL — AB
Protein, ur: 30 mg/dL — AB

## 2012-03-24 MED ORDER — IBUPROFEN 800 MG PO TABS: 800.0000 mg | ORAL_TABLET | Freq: Three times a day (TID) | ORAL | Status: AC

## 2012-03-24 MED ORDER — OXYCODONE-ACETAMINOPHEN 5-325 MG PO TABS
2.0000 | ORAL_TABLET | Freq: Once | ORAL | Status: AC
  Administered 2012-03-24: 1 via ORAL
  Filled 2012-03-24: qty 2

## 2012-03-24 MED ORDER — OXYCODONE-ACETAMINOPHEN 5-325 MG PO TABS: 1.0000 | ORAL_TABLET | Freq: Four times a day (QID) | ORAL | Status: AC | PRN

## 2012-03-24 NOTE — Discharge Instructions (Signed)
Back Pain, Adult Low back pain is very common. About 1 in 5 people have back pain.The cause of low back pain is rarely dangerous. The pain often gets better over time.About half of people with a sudden onset of back pain feel better in just 2 weeks. About 8 in 10 people feel better by 6 weeks.  CAUSES Some common causes of back pain include:  Strain of the muscles or ligaments supporting the spine.   Wear and tear (degeneration) of the spinal discs.   Arthritis.   Direct injury to the back.  DIAGNOSIS Most of the time, the direct cause of low back pain is not known.However, back pain can be treated effectively even when the exact cause of the pain is unknown.Answering your caregiver's questions about your overall health and symptoms is one of the most accurate ways to make sure the cause of your pain is not dangerous. If your caregiver needs more information, he or she may order lab work or imaging tests (X-rays or MRIs).However, even if imaging tests show changes in your back, this usually does not require surgery. HOME CARE INSTRUCTIONS For many people, back pain returns.Since low back pain is rarely dangerous, it is often a condition that people can learn to manageon their own.   Remain active. It is stressful on the back to sit or stand in one place. Do not sit, drive, or stand in one place for more than 30 minutes at a time. Take short walks on level surfaces as soon as pain allows.Try to increase the length of time you walk each day.   Do not stay in bed.Resting more than 1 or 2 days can delay your recovery.   Do not avoid exercise or work.Your body is made to move.It is not dangerous to be active, even though your back may hurt.Your back will likely heal faster if you return to being active before your pain is gone.   Pay attention to your body when you bend and lift. Many people have less discomfortwhen lifting if they bend their knees, keep the load close to their  bodies,and avoid twisting. Often, the most comfortable positions are those that put less stress on your recovering back.   Find a comfortable position to sleep. Use a firm mattress and lie on your side with your knees slightly bent. If you lie on your back, put a pillow under your knees.   Only take over-the-counter or prescription medicines as directed by your caregiver. Over-the-counter medicines to reduce pain and inflammation are often the most helpful.Your caregiver may prescribe muscle relaxant drugs.These medicines help dull your pain so you can more quickly return to your normal activities and healthy exercise.   Put ice on the injured area.   Put ice in a plastic bag.   Place a towel between your skin and the bag.   Leave the ice on for 15 to 20 minutes, 3 to 4 times a day for the first 2 to 3 days. After that, ice and heat may be alternated to reduce pain and spasms.   Ask your caregiver about trying back exercises and gentle massage. This may be of some benefit.   Avoid feeling anxious or stressed.Stress increases muscle tension and can worsen back pain.It is important to recognize when you are anxious or stressed and learn ways to manage it.Exercise is a great option.  SEEK MEDICAL CARE IF:  You have pain that is not relieved with rest or medicine.   You have   pain that does not improve in 1 week.   You have new symptoms.   You are generally not feeling well.  SEEK IMMEDIATE MEDICAL CARE IF:   You have pain that radiates from your back into your legs.   You develop new bowel or bladder control problems.   You have unusual weakness or numbness in your arms or legs.   You develop nausea or vomiting.   You develop abdominal pain.   You feel faint.  Document Released: 12/03/2005 Document Revised: 11/22/2011 Document Reviewed: 04/23/2011 [facility] Patient Information 2012 [place], [place].     It is important for you to follow up with Dr. Gaynell Face regarding your  ultrasound yesterday.  Take pain medication as needed.  Do not drive or operate heavy machinery while taking pain medication.

## 2012-03-24 NOTE — ED Notes (Addendum)
C/o back pain, sx onset 1700, also some nausea and abd pain, (denies: vd, fever or urinary sx). Seen at [facility] yesterday and dx'd with vaginitis. Here this morning d/t increased pain. "Back pain is new this am".

## 2012-03-24 NOTE — ED Notes (Signed)
Pt c/o sharp back pain since this afternoon described as sharp and constant. Also c/o intermittent abdominal pain. Denies injury. EDP at bedside for eval

## 2012-03-24 NOTE — ED Provider Notes (Signed)
History     CSN: [id number]  Arrival date & time 03/24/12  4098   First MD Initiated Contact with Patient 03/24/12 [phone]      Chief Complaint  Patient presents with  . Back Pain    (Consider location/radiation/quality/duration/timing/severity/associated sxs/prior treatment) HPI Comments: Patient comes in today with a chief complaint of back pain.  Pain located both mid and lower back pain.  She describes the pain as a deep sharp pain.  She took ibuprofen for the pain, which she did not feel helped.  PMH significant for Elective Abortion 03/03/12.  She was seen in the ED last evening and at that time an transvaginal ultrasound was done, which states that retained products of conception could not be excluded.  She was instructed to follow up with her Gynecologist right away.  Patient reports that she had vaginal bleeding for a couple of days after the abortion, but no vaginal bleeding since that time.  She denies any abdominal pain, vomiting, or passage of vaginal tissue.    The history is provided by the patient.    History reviewed. No pertinent past medical history.  Past Surgical History  Procedure Date  . Induced abortion     2 weeks ago    History reviewed. No pertinent family history.  History  Substance Use Topics  . Smoking status: Never Smoker   . Smokeless tobacco: Never Used  . Alcohol Use: No    OB History    Grav Para Term Preterm Abortions TAB SAB Ect Mult Living   4 1   1  1   1       Review of Systems  Constitutional: Negative for fever, chills and diaphoresis.  Gastrointestinal: Negative for nausea, vomiting and diarrhea.  Genitourinary: Negative for dysuria, hematuria, decreased urine volume, vaginal bleeding, difficulty urinating and vaginal pain.  Musculoskeletal: Positive for back pain. Negative for gait problem.  Skin: Negative for rash.    Allergies  Review of patient's allergies indicates no known allergies.  Home Medications   Current  Outpatient Rx  Name Route Sig Dispense Refill  . IBUPROFEN 200 MG PO TABS Oral Take 400 mg by mouth every 6 (six) hours as needed. Patient used this medication for stomach pain, and a headache.    Marland Kitchen METRONIDAZOLE 500 MG PO TABS Oral Take 1 tablet (500 mg total) by mouth 2 (two) times daily. 14 tablet 0    BP 109/62  Pulse 60  Temp(Src) 98.3 F (36.8 C) (Oral)  Resp 20  SpO2 100%  LMP 11/20/2011  Physical Exam  Nursing note and vitals reviewed. Constitutional: She appears well-developed and well-nourished. No distress.  HENT:  Head: Normocephalic and atraumatic.  Cardiovascular: Normal rate, regular rhythm and normal heart sounds.   Pulmonary/Chest: Effort normal and breath sounds normal.  Abdominal: Soft. Bowel sounds are normal. She exhibits no distension and no mass. There is no tenderness. There is no rebound and no guarding.  Genitourinary: Uterus normal. There is no rash, tenderness or lesion on the right labia. There is no rash, tenderness or lesion on the left labia. Cervix exhibits no motion tenderness. Right adnexum displays no mass and no tenderness. Left adnexum displays no mass and no tenderness. No tenderness or bleeding around the vagina. No vaginal discharge found.  Musculoskeletal: Normal range of motion.       Generalized tenderness to palpation of the entire back.  Neurological: She is alert.  Skin: Skin is warm and dry. She is not diaphoretic.  Psychiatric: She has a normal mood and affect.    ED Course  Procedures (including critical care time)   Labs Reviewed  URINALYSIS, ROUTINE W REFLEX MICROSCOPIC  HCG, QUANTITATIVE, PREGNANCY   US Transvaginal Non-ob  03/23/2012  *RADIOLOGY REPORT*  Clinical Data: Abdominal pain.  Elective abortion 2 weeks ago.  TRANSABDOMINAL AND TRANSVAGINAL ULTRASOUND OF PELVIS  Technique:  Both transabdominal and transvaginal ultrasound examinations of the pelvis were performed.  Transabdominal technique was performed for global  imaging of the pelvis including uterus, ovaries, adnexal regions, and pelvic cul-de-sac.  It was necessary to proceed with endovaginal exam following the transabdominal exam to visualize the ovaries and endometrium.  Comparison:  None.  Findings: Uterus:  The uterus appears normal measuring 9.9 x 4.3 x 5.2 cm.  Endometrium: The endometrium measures 18 mm in thickness. Echogenic debris and fluid is noted within the endometrial cavity. This measures 0.8 x 1.6 cm.  Right ovary: Normal appearance/no adnexal mass  Left ovary: Normal appearance/no adnexal mass  Other Findings:  No free fluid  IMPRESSION:  1.  There is a low density fluid and echogenic material within the uterine cavity.  Cannot rule out retained products of conception. 2.  Examination is otherwise normal.  Original Report Authenticated By: Rosealee Albee, M.D.   US Pelvis Complete  03/23/2012  *RADIOLOGY REPORT*  Clinical Data: Abdominal pain.  Elective abortion 2 weeks ago.  TRANSABDOMINAL AND TRANSVAGINAL ULTRASOUND OF PELVIS  Technique:  Both transabdominal and transvaginal ultrasound examinations of the pelvis were performed.  Transabdominal technique was performed for global imaging of the pelvis including uterus, ovaries, adnexal regions, and pelvic cul-de-sac.  It was necessary to proceed with endovaginal exam following the transabdominal exam to visualize the ovaries and endometrium.  Comparison:  None.  Findings: Uterus:  The uterus appears normal measuring 9.9 x 4.3 x 5.2 cm.  Endometrium: The endometrium measures 18 mm in thickness. Echogenic debris and fluid is noted within the endometrial cavity. This measures 0.8 x 1.6 cm.  Right ovary: Normal appearance/no adnexal mass  Left ovary: Normal appearance/no adnexal mass  Other Findings:  No free fluid  IMPRESSION:  1.  There is a low density fluid and echogenic material within the uterine cavity.  Cannot rule out retained products of conception. 2.  Examination is otherwise normal.  Original  Report Authenticated By: Rosealee Albee, M.D.     No diagnosis found.    MDM  Patient presenting with generalized back pain.  Patient s/p elective abortion on 03/03/12.  She was evaluated in the ED last evening and an ultrasound was performed to evaluate for retained products of conception.  Ultrasound results stated that this could not be excluded.  Patient instructed to follow up with Gynecology right away.  Patient denies any recent fever, vaginal bleeding, or vomiting.  Today there was no tenderness or obvious bleeding on bimanual exam.  Beta hcg was performed, which was 12.  Discussed patient with Dr. Anitra Lauth.  Patient given Rx for short course of pain medication and instruucted to follow up with GYN.        Pascal Lux [place], PA-C 03/25/12 1614  Pascal Lux [place], PA-C 03/25/12 1615

## 2012-03-24 NOTE — ED Notes (Signed)
Pt given discharge and follow up instructions without further questions. Discussed prescriptions. Pt verbalized understanding that she is unable to drive while taking meds. Denies further questions or needs at this time

## 2012-03-24 NOTE — ED Notes (Signed)
Pt refused 2nd percocet tablet stating "i don't want to be too drowsy" 2nd tablet wasted with heather, rn

## 2012-03-26 NOTE — ED Provider Notes (Signed)
Medical screening examination/treatment/procedure(s) were performed by non-physician practitioner and as supervising physician I was immediately available for consultation/collaboration.   Ithzel Fedorchak, MD 03/26/12 2349 

## 2012-04-09 ENCOUNTER — Encounter (HOSPITAL_COMMUNITY): Payer: Self-pay | Admitting: *Deleted

## 2012-04-09 ENCOUNTER — Emergency Department (HOSPITAL_COMMUNITY)
Admission: EM | Admit: 2012-04-09 | Discharge: 2012-04-09 | Disposition: A | Discharge status: Home / Self Care | Payer: Medicaid Other | Attending: Emergency Medicine | Admitting: Emergency Medicine

## 2012-04-09 DIAGNOSIS — M549 Dorsalgia, unspecified: Secondary | ICD-10-CM | POA: Insufficient documentation

## 2012-04-09 DIAGNOSIS — M62838 Other muscle spasm: Secondary | ICD-10-CM | POA: Insufficient documentation

## 2012-04-09 MED ORDER — DIAZEPAM 5 MG PO TABS
5.0000 mg | ORAL_TABLET | Freq: Once | ORAL | Status: DC
  Filled 2012-04-09: qty 1

## 2012-04-09 MED ORDER — CYCLOBENZAPRINE HCL 10 MG PO TABS: ORAL_TABLET | ORAL | Status: DC

## 2012-04-09 MED ORDER — ACETAMINOPHEN 325 MG PO TABS
650.0000 mg | ORAL_TABLET | Freq: Once | ORAL | Status: AC
  Administered 2012-04-09: 650 mg via ORAL
  Filled 2012-04-09: qty 2

## 2012-04-09 NOTE — ED Provider Notes (Signed)
Medical screening examination/treatment/procedure(s) were performed by non-physician practitioner and as supervising physician I was immediately available for consultation/collaboration.  Ivor Kishi, MD 04/09/12 1930 

## 2012-04-09 NOTE — ED Provider Notes (Signed)
History     CSN: [id number]  Arrival date & time 04/09/12  1806   First MD Initiated Contact with Patient 04/09/12 1852      Chief Complaint  Patient presents with  . Back Pain    (Consider location/radiation/quality/duration/timing/severity/associated sxs/prior treatment) HPI  Patient presents to emergency department complaining of acute onset back pain and muscle spasm. Patient states that just a few hours prior to arrival she was in her home and had acute onset tightening and pain from her upper back down to her lower back. Patient states the pain was like a muscle spasm "grabbing and made me lay on the ground and not move." Patient states pain is aggravated by movement. Patient states that severe pain decreased over a hour period but she has mild to moderate ongoing "achiness" in her entire back. Patient states pain is aggravated by touch and movement. Patient states that this pain is similar to pain that she had last week and the week prior. Patient states she stands on her feet all day for work but denies known injury to her back. Patient states that she has no known medical problems and takes no medicine on regular basis. She denies radiation of pain, extremity numbness/tingling/weakness, chest pain, shortness of breath, abdominal pain, difficulty ambulating, dysuria, hematuria, vaginal discharge, or flank pain. Patient states that she took 800 mg of ibuprofen at 5 PM with some improvement in symptoms.  History reviewed. No pertinent past medical history.  Past Surgical History  Procedure Date  . Induced abortion     2 weeks ago    History reviewed. No pertinent family history.  History  Substance Use Topics  . Smoking status: Never Smoker   . Smokeless tobacco: Never Used  . Alcohol Use: No    OB History    Grav Para Term Preterm Abortions TAB SAB Ect Mult Living   4 1   1  1   1       Review of Systems  All other systems reviewed and are negative.    Allergies    Review of patient's allergies indicates no known allergies.  Home Medications   Current Outpatient Rx  Name Route Sig Dispense Refill  . IBUPROFEN 200 MG PO TABS Oral Take 400 mg by mouth every 6 (six) hours as needed. For back pain.    . CYCLOBENZAPRINE HCL 10 MG PO TABS  Take one tablet by mouth every 6 hours as needed for muscle relaxation 20 tablet 0    BP 116/61  Pulse 55  Temp(Src) 98.1 F (36.7 C) (Oral)  Resp 18  SpO2 100%  LMP 11/20/2011  Physical Exam  Nursing note and vitals reviewed. Constitutional: She is oriented to person, place, and time. She appears well-developed and well-nourished. No distress.  HENT:  Head: Normocephalic and atraumatic.  Eyes: Conjunctivae are normal.  Neck: Normal range of motion. Neck supple.  Cardiovascular: Normal rate, regular rhythm, normal heart sounds and intact distal pulses.  Exam reveals no gallop and no friction rub.   No murmur heard. Pulmonary/Chest: Effort normal and breath sounds normal. No respiratory distress. She has no wheezes. She has no rales. She exhibits no tenderness.  Abdominal: Soft. Bowel sounds are normal. She exhibits no distension and no mass. There is no tenderness. There is no rebound and no guarding.  Musculoskeletal: Normal range of motion. She exhibits tenderness. She exhibits no edema.       Tenderness to palpation of soft tissue of entire back with some  muscle spasticity but no crepitus or skin changes. No midline spinal tenderness to palpation. Full range of motion of bilateral upper extremities and lower extremities with no pain with 5 out of 5 strength. Normal reflexes bilaterally.  Neurological: She is alert and oriented to person, place, and time.  Skin: Skin is warm and dry. No rash noted. She is not diaphoretic. No erythema.  Psychiatric: She has a normal mood and affect.    ED Course  Procedures (including critical care time)  By mouth Valium  Labs Reviewed - No data to display No results  found.   1. Muscle spasm   2. Back pain       MDM  Soft tissue tenderness to palpation and muscle spasticity without skin changes or crepitus. Patient has no signs or symptoms of central cord compression or cauda equina. Symptoms have dramatically improved since onset. Patient is no known medical problems and takes no medicines on a regular basis. Patient is agreeable to following up with her primary care provider in the next one to 2 weeks for recheck of ongoing recurrent symptoms but to return to emergency department for any emergent changing or worsening symptoms. Abdomen soft and nontender. She denies urinary symptoms.        Jenness [place], [place] 04/09/12 1916

## 2012-04-09 NOTE — ED Notes (Signed)
Reports sudden onset of back pain, lower back pain and radiates into upper back, denies injury to back, denies urinary symptoms. No acute distress noted at triage.

## 2012-04-09 NOTE — Discharge Instructions (Signed)
You may continue to use ibuprofen for inflammation and pain using extra strength Tylenol for  breakthrough pain. Take Flexeril as directed for muscle relaxation. Followup with your primary care provider in the next 1 to 2 weeks for recheck of ongoing back pain. You may also use ice for additional spasm and pain relief. Return to emergency department for any emergent changing or worsening symptoms.  Back Pain, Adult Low back pain is very common. About 1 in 5 people have back pain.The cause of low back pain is rarely dangerous. The pain often gets better over time.About half of people with a sudden onset of back pain feel better in just 2 weeks. About 8 in 10 people feel better by 6 weeks.  CAUSES Some common causes of back pain include:  Strain of the muscles or ligaments supporting the spine.   Wear and tear (degeneration) of the spinal discs.   Arthritis.   Direct injury to the back.  DIAGNOSIS Most of the time, the direct cause of low back pain is not known.However, back pain can be treated effectively even when the exact cause of the pain is unknown.Answering your caregiver's questions about your overall health and symptoms is one of the most accurate ways to make sure the cause of your pain is not dangerous. If your caregiver needs more information, he or she may order lab work or imaging tests (X-rays or MRIs).However, even if imaging tests show changes in your back, this usually does not require surgery. HOME CARE INSTRUCTIONS For many people, back pain returns.Since low back pain is rarely dangerous, it is often a condition that people can learn to [facility] their own.   Remain active. It is stressful on the back to sit or stand in one place. Do not sit, drive, or stand in one place for more than 30 minutes at a time. Take short walks on level surfaces as soon as pain allows.Try to increase the length of time you walk each day.   Do not stay in bed.Resting more than 1 or 2 days  can delay your recovery.   Do not avoid exercise or work.Your body is made to move.It is not dangerous to be active, even though your back may hurt.Your back will likely heal faster if you return to being active before your pain is gone.   Pay attention to your body when you bend and lift. Many people have less discomfortwhen lifting if they bend their knees, keep the load close to their bodies,and avoid twisting. Often, the most comfortable positions are those that put less stress on your recovering back.   Find a comfortable position to sleep. Use a firm mattress and lie on your side with your knees slightly bent. If you lie on your back, put a pillow under your knees.   Only take over-the-counter or prescription medicines as directed by your caregiver. Over-the-counter medicines to reduce pain and inflammation are often the most helpful.Your caregiver may prescribe muscle relaxant drugs.These medicines help dull your pain so you can more quickly return to your normal activities and healthy exercise.   Put ice on the injured area.   Put ice in a plastic bag.   Place a towel between your skin and the bag.   Leave the ice on for 15 to 20 minutes, 3 to 4 times a day for the first 2 to 3 days. After that, ice and heat may be alternated to reduce pain and spasms.   Ask your caregiver about trying  back exercises and gentle massage. This may be of some benefit.   Avoid feeling anxious or stressed.Stress increases muscle tension and can worsen back pain.It is important to recognize when you are anxious or stressed and learn ways to manage it.Exercise is a great option.  SEEK MEDICAL CARE IF:  You have pain that is not relieved with rest or medicine.   You have pain that does not improve in 1 week.   You have new symptoms.   You are generally not feeling well.  SEEK IMMEDIATE MEDICAL CARE IF:   You have pain that radiates from your back into your legs.   You develop new bowel or  bladder control problems.   You have unusual weakness or numbness in your arms or legs.   You develop nausea or vomiting.   You develop abdominal pain.   You feel faint.  Document Released: 12/03/2005 Document Revised: 11/22/2011 Document Reviewed: 04/23/2011 [facility] Patient Information 2012 [place], [place].  Muscle Cramps Muscle cramps are due to sudden involuntary muscle contraction. This means you have no control over the tightening of a muscle (or muscles). Often there are no obvious causes. Muscle cramps may occur with overexertion. They may also occur with chilling of the muscles. An example of a muscle chilling activity is swimming. It is uncommon for cramps to be due to a serious underlying disorder. In most cases, muscle cramps improve (or leave) within minutes. CAUSES  Some common causes are: Injury.  Infections, especially viral.  Abnormal levels of the salts and ions in your blood (electrolytes). This could happen if you are taking water pills (diuretics).  Blood vessel disease where not enough blood is getting to the muscles (intermittent claudication).  Some uncommon causes are: Side effects of some medicine (such as lithium).  Alcohol abuse.  Diseases where there is soreness (inflammation) of the muscular system.  HOME CARE INSTRUCTIONS  It may be helpful to massage, stretch, and relax the affected muscle.  Taking a dose of over-the-counter diphenhydramine is helpful for night leg cramps.  SEEK MEDICAL CARE IF:  Cramps are frequent and not relieved with medicine. MAKE SURE YOU:  Understand these instructions.  Will watch your condition.  Will get help right away if you are not doing well or get worse.  Document Released: 05/25/2002 Document Revised: 11/22/2011 Document Reviewed: 11/24/2008 [facility] Patient Information 2012 [place], [place].

## 2012-04-09 NOTE — ED Notes (Signed)
Patient states had an abortion on march 18, states since having the ab she has had "episodes" where her whole back hurts . States comes in waves. States her menses have been wnl and regular. Denies vag discharge or urinary sx. Denies cough or fever. Lungs clear. Denies injury or heavy lifting . States bowels are moving wnl. States did not have these episodes until after the ab. States with 1 episode she broke into a sweat and had to take her clothes off

## 2012-06-09 ENCOUNTER — Ambulatory Visit: Payer: Medicaid Other | Admitting: Advanced Practice Midwife

## 2012-07-04 ENCOUNTER — Emergency Department (HOSPITAL_COMMUNITY)
Admission: EM | Admit: 2012-07-04 | Discharge: 2012-07-04 | Disposition: A | Discharge status: Home / Self Care | Payer: Medicaid Other | Attending: Emergency Medicine | Admitting: Emergency Medicine

## 2012-07-04 ENCOUNTER — Encounter (HOSPITAL_COMMUNITY): Payer: Self-pay | Admitting: *Deleted

## 2012-07-04 DIAGNOSIS — M549 Dorsalgia, unspecified: Secondary | ICD-10-CM

## 2012-07-04 DIAGNOSIS — M546 Pain in thoracic spine: Secondary | ICD-10-CM | POA: Insufficient documentation

## 2012-07-04 LAB — URINALYSIS, ROUTINE W REFLEX MICROSCOPIC
Bilirubin Urine: NEGATIVE
Ketones, ur: NEGATIVE mg/dL
Nitrite: NEGATIVE
Urobilinogen, UA: 0.2 mg/dL (ref 0.0–1.0)
pH: 6 (ref 5.0–8.0)

## 2012-07-04 LAB — URINE MICROSCOPIC-ADD ON

## 2012-07-04 MED ORDER — IBUPROFEN 800 MG PO TABS: 800.0000 mg | ORAL_TABLET | Freq: Three times a day (TID) | ORAL | Status: AC

## 2012-07-04 MED ORDER — KETOROLAC TROMETHAMINE 60 MG/2ML IM SOLN
60.0000 mg | Freq: Once | INTRAMUSCULAR | Status: AC
  Administered 2012-07-04: 60 mg via INTRAMUSCULAR
  Filled 2012-07-04: qty 2

## 2012-07-04 MED ORDER — CYCLOBENZAPRINE HCL 10 MG PO TABS: 10.0000 mg | ORAL_TABLET | Freq: Two times a day (BID) | ORAL | Status: AC | PRN

## 2012-07-04 NOTE — ED Notes (Addendum)
Pt is here with mid back pain (flank) which began 2 days ago and was not associated with any injury.  Pt denies any numbness or tingling in her legs.  Pt denies any sob with this, pain does not increase when she takes a deep breath or with movement.

## 2012-07-04 NOTE — ED Notes (Signed)
Pt denies any pain or questions upon discharge. 

## 2012-07-04 NOTE — ED Provider Notes (Signed)
History     CSN: [id number]  Arrival date & time 07/04/12  0113   First MD Initiated Contact with Patient 07/04/12 0501      Chief Complaint  Patient presents with  . Back Pain    (Consider location/radiation/quality/duration/timing/severity/associated sxs/prior treatment) HPI Back pain and feels like muscle spasms. History provided by patient. Had similar symptoms about a month ago and was evaluated in emergency department given medications to help. She took ibuprofen and Percocet which she ran out of medications symptoms returned. She's having trouble sleeping due to symptoms feels like she is cramping up. No trauma. Works at [employer] Has been trying heat without relief. No neck pain. No fevers. No rash. No recent illness. For difficulty urinating. No low back pain. Location MID bilateral back. No weakness or numbness. No difficulty ambulating. No family history of spinal problems. No primary care physician. Requesting referrals. History reviewed. No pertinent past medical history.  Past Surgical History  Procedure Date  . Induced abortion     2 weeks ago    No family history on file.  History  Substance Use Topics  . Smoking status: Never Smoker   . Smokeless tobacco: Never Used  . Alcohol Use: No    OB History    Grav Para Term Preterm Abortions TAB SAB Ect Mult Living   4 1   1  1   1       Review of Systems  Constitutional: Negative for fever and chills.  HENT: Negative for neck pain and neck stiffness.   Eyes: Negative for pain.  Respiratory: Negative for shortness of breath.   Cardiovascular: Negative for chest pain.  Gastrointestinal: Negative for abdominal pain.  Genitourinary: Negative for dysuria.  Musculoskeletal: Positive for back pain.  Skin: Negative for rash.  Neurological: Negative for headaches.  All other systems reviewed and are negative.    Allergies  Review of patient's allergies indicates no known allergies.  Home Medications    Current Outpatient Rx  Name Route Sig Dispense Refill  . IBUPROFEN 200 MG PO TABS Oral Take 400 mg by mouth every 6 (six) hours as needed. For back pain.      BP 117/61  Pulse 58  Temp 97.8 F (36.6 C) (Oral)  Resp 16  SpO2 100%  LMP 06/25/2012  Physical Exam  Constitutional: She is oriented to person, place, and time. She appears well-developed and well-nourished.  HENT:  Head: Normocephalic and atraumatic.  Eyes: Conjunctivae and EOM are normal. Pupils are equal, round, and reactive to light.  Neck: Trachea normal. Neck supple. No thyromegaly present.  Cardiovascular: Normal rate, regular rhythm, S1 normal, S2 normal and normal pulses.     No systolic murmur is present   No diastolic murmur is present  Pulses:      Radial pulses are 2+ on the right side, and 2+ on the left side.  Pulmonary/Chest: Effort normal and breath sounds normal. She has no wheezes. She has no rhonchi. She has no rales. She exhibits no tenderness.  Abdominal: Soft. Normal appearance and bowel sounds are normal. There is no tenderness. There is no CVA tenderness and negative Murphy's sign.  Musculoskeletal:       No midline cervical thoracic or lumbar tenderness. Does have bilateral parathoracic tenderness with mild spasm. No upper extremity or lower extremity weakness or deficits. Grips, triceps biceps and dorsi plantar flexion equal and intact.   Neurological: She is alert and oriented to person, place, and time. She has normal  strength. No cranial nerve deficit or sensory deficit. GCS eye subscore is 4. GCS verbal subscore is 5. GCS motor subscore is 6.  Skin: Skin is warm and dry. No rash noted. She is not diaphoretic.  Psychiatric: Her speech is normal.       Cooperative and appropriate    ED Course  Procedures (including critical care time)  Results for orders placed during the hospital encounter of 07/04/12  URINALYSIS, ROUTINE W REFLEX MICROSCOPIC      Component Value Range   Color, Urine  YELLOW  YELLOW   APPearance CLEAR  CLEAR   Specific Gravity, Urine 1.035 (*) 1.005 - 1.030   pH 6.0  5.0 - 8.0   Glucose, UA NEGATIVE  NEGATIVE mg/dL   Hgb urine dipstick NEGATIVE  NEGATIVE   Bilirubin Urine NEGATIVE  NEGATIVE   Ketones, ur NEGATIVE  NEGATIVE mg/dL   Protein, ur 161 (*) NEGATIVE mg/dL   Urobilinogen, UA 0.2  0.0 - 1.0 mg/dL   Nitrite NEGATIVE  NEGATIVE   Leukocytes, UA NEGATIVE  NEGATIVE  POCT PREGNANCY, URINE      Component Value Range   Preg Test, Ur NEGATIVE  NEGATIVE  URINE MICROSCOPIC-ADD ON      Component Value Range   Squamous Epithelial / LPF RARE  RARE   WBC, UA 0-2  <3 WBC/hpf   RBC / HPF 0-2  <3 RBC/hpf   Bacteria, UA RARE  RARE   Casts HYALINE CASTS (*) NEGATIVE   Urine-Other MUCOUS PRESENT     'IM Toradol  Recheck after medications pain resolved patient felt much better. Exam unchanged.  Back pain likely musculoskeletal in nature. Prescriptions provided and plan outpatient followup. MDM     Nursing notes reviewed. Old records reviewed. Vital signs reviewed. Urinalysis obtained and reviewed as above. Outpatient referral provided.       Sunnie Nielsen, MD 07/04/12 [phone]

## 2012-07-04 NOTE — ED Notes (Signed)
Patient with back pain, generalized through out her back.  Patient states that it has gotten worse in the last few days.  Patient states that it has been going on for two days duration.  Patient did take some ibuprofen with no relief.

## 2012-07-04 NOTE — ED Notes (Signed)
Pt describes this pain as in bilateral flank areas of back with radiation to abdomen and chest

## 2012-07-14 ENCOUNTER — Ambulatory Visit: Payer: Medicaid Other | Admitting: Family

## 2012-07-29 ENCOUNTER — Encounter (HOSPITAL_COMMUNITY): Payer: Self-pay | Admitting: Emergency Medicine

## 2012-07-29 ENCOUNTER — Emergency Department (HOSPITAL_COMMUNITY)
Admission: EM | Admit: 2012-07-29 | Discharge: 2012-07-29 | Disposition: A | Discharge status: Home / Self Care | Payer: Medicaid Other | Attending: Emergency Medicine | Admitting: Emergency Medicine

## 2012-07-29 DIAGNOSIS — G8929 Other chronic pain: Secondary | ICD-10-CM | POA: Insufficient documentation

## 2012-07-29 DIAGNOSIS — R197 Diarrhea, unspecified: Secondary | ICD-10-CM | POA: Insufficient documentation

## 2012-07-29 DIAGNOSIS — M549 Dorsalgia, unspecified: Secondary | ICD-10-CM

## 2012-07-29 DIAGNOSIS — R112 Nausea with vomiting, unspecified: Secondary | ICD-10-CM | POA: Insufficient documentation

## 2012-07-29 LAB — URINALYSIS, DIPSTICK ONLY
Glucose, UA: NEGATIVE mg/dL
Leukocytes, UA: NEGATIVE
Protein, ur: 30 mg/dL — AB

## 2012-07-29 LAB — POCT I-STAT, CHEM 8
Calcium, Ion: 1.25 mmol/L — ABNORMAL HIGH (ref 1.12–1.23)
Chloride: 105 mEq/L (ref 96–112)
HCT: 41 % (ref 36.0–46.0)
Hemoglobin: 13.9 g/dL (ref 12.0–15.0)
TCO2: 23 mmol/L (ref 0–100)

## 2012-07-29 LAB — POCT PREGNANCY, URINE: Preg Test, Ur: NEGATIVE

## 2012-07-29 MED ORDER — ONDANSETRON 4 MG PO TBDP
ORAL_TABLET | ORAL | Status: AC
  Filled 2012-07-29: qty 1

## 2012-07-29 MED ORDER — HYDROCODONE-ACETAMINOPHEN 5-325 MG PO TABS
2.0000 | ORAL_TABLET | Freq: Once | ORAL | Status: AC
  Administered 2012-07-29: 2 via ORAL
  Filled 2012-07-29: qty 2

## 2012-07-29 MED ORDER — ONDANSETRON 4 MG PO TBDP
4.0000 mg | ORAL_TABLET | Freq: Once | ORAL | Status: AC
  Administered 2012-07-29: 4 mg via ORAL

## 2012-07-29 NOTE — ED Notes (Signed)
Back pain, with nausea/vomiting/diarrhea-- started at 2am . Hurts to stand, no known injury

## 2012-07-29 NOTE — ED Provider Notes (Signed)
History     CSN: [id number]  Arrival date & time 07/29/12  0945   First MD Initiated Contact with Patient 07/29/12 1046      Chief Complaint  Patient presents with  . Back Pain  . Nausea  . Diarrhea    (Consider location/radiation/quality/duration/timing/severity/associated sxs/prior treatment) HPI Complains of midthoracic back pain since March 2013 constant becoming worse this morning. Treated with ibuprofen and Flexeril this morning without relief. After taking ibuprofen she developed vomiting 3 episodes of diarrhea 3 episodes. Denies nausea at present. No fever no cough no shortness of breath no chest pain no abdominal pain no other complaint pain is not made better or worse by anything is moderate at present, nonradiating History reviewed. No pertinent past medical history.  Past Surgical History  Procedure Date  . Induced abortion     2 weeks ago    No family history on file.  History  Substance Use Topics  . Smoking status: Never Smoker   . Smokeless tobacco: Never Used  . Alcohol Use: No    OB History    Grav Para Term Preterm Abortions TAB SAB Ect Mult Living   4 1   1  1   1       Review of Systems  Constitutional: Negative.   HENT: Negative.   Respiratory: Negative.   Cardiovascular: Negative.   Gastrointestinal: Positive for vomiting and diarrhea.  Musculoskeletal: Positive for back pain.  Skin: Negative.   Neurological: Negative.   Hematological: Negative.   Psychiatric/Behavioral: Negative.   All other systems reviewed and are negative.    Allergies  Review of patient's allergies indicates no known allergies.  Home Medications   Current Outpatient Rx  Name Route Sig Dispense Refill  . CYCLOBENZAPRINE HCL 10 MG PO TABS Oral Take 10 mg by mouth 2 (two) times daily as needed. Spasms    . IBUPROFEN 200 MG PO TABS Oral Take 600 mg by mouth every 6 (six) hours as needed. For back pain.    . IBUPROFEN 800 MG PO TABS Oral Take 800 mg by mouth  every 8 (eight) hours as needed. Back pain      BP 119/70  Pulse 61  Temp 97.5 F (36.4 C) (Oral)  Resp 16  SpO2 100%  LMP 07/14/2012  Physical Exam  Nursing note and vitals reviewed. Constitutional: She appears well-developed and well-nourished.  HENT:  Head: Normocephalic and atraumatic.  Eyes: Conjunctivae are normal. Pupils are equal, round, and reactive to light.  Neck: Neck supple. No tracheal deviation present. No thyromegaly present.  Cardiovascular: Normal rate and regular rhythm.   No murmur heard. Pulmonary/Chest: Effort normal and breath sounds normal.  Abdominal: Soft. Bowel sounds are normal. She exhibits no distension. There is no tenderness.  Musculoskeletal: Normal range of motion. She exhibits no edema and no tenderness.       No point tenderness along spine. Pain at the thoracolumbar area when she sits up from a supine position  Neurological: She is alert. She has normal reflexes. Coordination normal.       Motor strength 5 over 5 overall gait normal  Skin: Skin is warm and dry. No rash noted.  Psychiatric: She has a normal mood and affect.    ED Course  Procedures (including critical care time) 4:40 PM feels improved after treatment with hydrocosoodne-apap Labs Reviewed  URINALYSIS, DIPSTICK ONLY - Abnormal; Notable for the following:    Ketones, ur TRACE (*)     Protein, ur 30 (*)  All other components within normal limits  POCT I-STAT, CHEM 8 - Abnormal; Notable for the following:    Calcium, Ion 1.25 (*)     All other components within normal limits  POCT PREGNANCY, URINE   No results found.   No diagnosis found.  Results for orders placed during the hospital encounter of 07/29/12  URINALYSIS, DIPSTICK ONLY      Component Value Range   Specific Gravity, Urine 1.029  1.005 - 1.030   pH 6.5  5.0 - 8.0   Glucose, UA NEGATIVE  NEGATIVE mg/dL   Hgb urine dipstick NEGATIVE  NEGATIVE   Bilirubin Urine NEGATIVE  NEGATIVE   Ketones, ur TRACE  (*) NEGATIVE mg/dL   Protein, ur 30 (*) NEGATIVE mg/dL   Urobilinogen, UA 1.0  0.0 - 1.0 mg/dL   Nitrite NEGATIVE  NEGATIVE   Leukocytes, UA NEGATIVE  NEGATIVE  POCT PREGNANCY, URINE      Component Value Range   Preg Test, Ur NEGATIVE  NEGATIVE  POCT I-STAT, CHEM 8      Component Value Range   Sodium 140  135 - 145 mEq/L   Potassium 3.6  3.5 - 5.1 mEq/L   Chloride 105  96 - 112 mEq/L   BUN 10  6 - 23 mg/dL   Creatinine, Ser 5.78  0.50 - 1.10 mg/dL   Glucose, Bld 86  70 - 99 mg/dL   Calcium, Ion 4.69 (*) 1.12 - 1.23 mmol/L   TCO2 23  0 - 100 mmol/L   Hemoglobin 13.9  12.0 - 15.0 g/dL   HCT 62.9  52.8 - 41.3 %   No results found.   MDM  Plan Tylenol when necessary pain avoid ibuprofen. Ibuprofen may have contributed to vomiting. Follow  Diagnosis #1 chronic back pain #2 nausea and vomiting and diarrhea        Doug Sou, MD 07/29/12 1246

## 2012-07-29 NOTE — ED Notes (Signed)
PT vomiting in room. Dr. Shela Commons informed and instructed to wait to see how pt respond first.

## 2012-08-03 ENCOUNTER — Emergency Department (HOSPITAL_COMMUNITY)
Admission: EM | Admit: 2012-08-03 | Discharge: 2012-08-03 | Disposition: A | Discharge status: Home / Self Care | Payer: Medicaid Other | Attending: Emergency Medicine | Admitting: Emergency Medicine

## 2012-08-03 ENCOUNTER — Emergency Department (HOSPITAL_COMMUNITY): Payer: Medicaid Other

## 2012-08-03 ENCOUNTER — Encounter (HOSPITAL_COMMUNITY): Payer: Self-pay

## 2012-08-03 DIAGNOSIS — R109 Unspecified abdominal pain: Secondary | ICD-10-CM

## 2012-08-03 DIAGNOSIS — K8020 Calculus of gallbladder without cholecystitis without obstruction: Secondary | ICD-10-CM | POA: Insufficient documentation

## 2012-08-03 DIAGNOSIS — R112 Nausea with vomiting, unspecified: Secondary | ICD-10-CM | POA: Insufficient documentation

## 2012-08-03 LAB — COMPREHENSIVE METABOLIC PANEL
ALT: 11 U/L (ref 0–35)
AST: 17 U/L (ref 0–37)
Alkaline Phosphatase: 66 U/L (ref 39–117)
CO2: 25 mEq/L (ref 19–32)
GFR calc Af Amer: >90 mL/min (ref >90)
GFR calc non Af Amer: >90 mL/min (ref >90)
Glucose, Bld: 99 mg/dL (ref 70–99)
Potassium: 3.8 mEq/L (ref 3.5–5.1)
Sodium: 137 mEq/L (ref 135–145)
Total Protein: 8.1 g/dL (ref 6.0–8.3)

## 2012-08-03 LAB — URINALYSIS, ROUTINE W REFLEX MICROSCOPIC
Bilirubin Urine: NEGATIVE
Hgb urine dipstick: NEGATIVE
Protein, ur: NEGATIVE mg/dL
Urobilinogen, UA: 1 mg/dL (ref 0.0–1.0)

## 2012-08-03 LAB — POCT I-STAT, CHEM 8
BUN: 6 mg/dL (ref 6–23)
Chloride: 105 mEq/L (ref 96–112)
HCT: 40 % (ref 36.0–46.0)
Sodium: 140 mEq/L (ref 135–145)

## 2012-08-03 LAB — CBC WITH DIFFERENTIAL/PLATELET
Basophils Absolute: 0 10*3/uL (ref 0.0–0.1)
HCT: 37.2 % (ref 36.0–46.0)
Lymphocytes Relative: 18 % (ref 12–46)
Neutro Abs: 6 10*3/uL (ref 1.7–7.7)
Neutrophils Relative %: 77 % (ref 43–77)
Platelets: 246 10*3/uL (ref 150–400)
RDW: 12.5 % (ref 11.5–15.5)
WBC: 7.8 10*3/uL (ref 4.0–10.5)

## 2012-08-03 LAB — PREGNANCY, URINE: Preg Test, Ur: NEGATIVE

## 2012-08-03 LAB — LIPASE, BLOOD: Lipase: 25 U/L (ref 11–59)

## 2012-08-03 MED ORDER — HYDROMORPHONE HCL PF 1 MG/ML IJ SOLN
1.0000 mg | Freq: Once | INTRAMUSCULAR | Status: AC
  Administered 2012-08-03: 1 mg via INTRAVENOUS
  Filled 2012-08-03: qty 1

## 2012-08-03 MED ORDER — ONDANSETRON HCL 4 MG/2ML IJ SOLN: 4.0000 mg | Freq: Once | INTRAMUSCULAR | Status: DC

## 2012-08-03 MED ORDER — ONDANSETRON HCL 4 MG/2ML IJ SOLN
4.0000 mg | Freq: Once | INTRAMUSCULAR | Status: AC
  Administered 2012-08-03: 4 mg via INTRAVENOUS
  Filled 2012-08-03: qty 2

## 2012-08-03 MED ORDER — OXYCODONE-ACETAMINOPHEN 5-325 MG PO TABS: 2.0000 | ORAL_TABLET | ORAL | Status: AC | PRN

## 2012-08-03 MED ORDER — ONDANSETRON HCL 4 MG PO TABS: 4.0000 mg | ORAL_TABLET | Freq: Four times a day (QID) | ORAL | Status: AC

## 2012-08-03 MED ORDER — HYDROMORPHONE HCL PF 1 MG/ML IJ SOLN: 1.0000 mg | Freq: Once | INTRAMUSCULAR | Status: DC

## 2012-08-03 MED ORDER — SODIUM CHLORIDE 0.9 % IV BOLUS (SEPSIS)
1000.0000 mL | Freq: Once | INTRAVENOUS | Status: AC
  Administered 2012-08-03: 1000 mL via INTRAVENOUS

## 2012-08-03 MED ORDER — SODIUM CHLORIDE 0.9 % IV BOLUS (SEPSIS): 1000.0000 mL | Freq: Once | INTRAVENOUS | Status: DC

## 2012-08-03 NOTE — ED Provider Notes (Signed)
History     CSN: [id number]  Arrival date & time 08/03/12  0534   First MD Initiated Contact with Patient 08/03/12 [phone]      Chief Complaint  Patient presents with  . Flank Pain    (Consider location/radiation/quality/duration/timing/severity/associated sxs/prior treatment) Patient is a 26 y.o. female presenting with abdominal pain. The history is provided by the patient. No language interpreter was used.  Abdominal Pain The primary symptoms of the illness include abdominal pain, nausea, vomiting and diarrhea. The primary symptoms of the illness do not include fever, shortness of breath, dysuria, vaginal discharge or vaginal bleeding. The current episode started more than 2 days ago. The onset of the illness was gradual. The problem has been gradually worsening.  The patient states that she believes she is currently not pregnant. The patient has had a change in bowel habit. Additional symptoms associated with the illness include hematuria and back pain. Symptoms associated with the illness do not include chills, diaphoresis, constipation or frequency. Significant associated medical issues do not include GERD, diabetes, gallstones, liver disease or substance abuse.   26 year old female coming in for the fourth time in 4 months complaining of R flank pain and right upper quadrant pain. States the pain started around 12 midnight and when she laid down to go to sleep after she ate she had flank pain that radiated to her right upper quadrant. States she has vomited had several times and  Had diarrhea with this. She's been treated for back pain in the past couple times she came to the ER after induced abortion in April.  Denies vaginal discharge or bleeding.  There was no blood in her urine. Will proceed with the ultrasound of the abdomen to rule out cholecystitis.   Patient works at [employer]. She has been taking vicodin and flexeril with no relief.  + Murphy sign    History reviewed. No pertinent  past medical history.  Past Surgical History  Procedure Date  . Induced abortion     2 weeks ago    History reviewed. No pertinent family history.  History  Substance Use Topics  . Smoking status: Never Smoker   . Smokeless tobacco: Never Used  . Alcohol Use: No    OB History    Grav Para Term Preterm Abortions TAB SAB Ect Mult Living   4 1   1  1   1       Review of Systems  Constitutional: Negative.  Negative for fever, chills and diaphoresis.  HENT: Negative.   Eyes: Negative.   Respiratory: Negative.  Negative for shortness of breath.   Cardiovascular: Negative.   Gastrointestinal: Positive for nausea, vomiting, abdominal pain and diarrhea. Negative for constipation, blood in stool and abdominal distention.  Genitourinary: Positive for hematuria and flank pain. Negative for dysuria, frequency, vaginal bleeding, vaginal discharge, difficulty urinating and dyspareunia.  Musculoskeletal: Positive for back pain.  Neurological: Negative.  Negative for dizziness and light-headedness.  Psychiatric/Behavioral: Negative.   All other systems reviewed and are negative.    Allergies  Review of patient's allergies indicates no known allergies.  Home Medications   Current Outpatient Rx  Name Route Sig Dispense Refill  . CYCLOBENZAPRINE HCL 10 MG PO TABS Oral Take 10 mg by mouth 2 (two) times daily as needed. Spasms    . IBUPROFEN 200 MG PO TABS Oral Take 600 mg by mouth every 6 (six) hours as needed. For back pain.      BP 128/64  Pulse 60  Temp 98.8 F (37.1 C) (Oral)  Resp 16  SpO2 100%  LMP 07/20/2012  Physical Exam  Nursing note and vitals reviewed. Constitutional: She is oriented to person, place, and time. She appears well-developed and well-nourished.  HENT:  Head: Normocephalic and atraumatic.  Eyes: Conjunctivae and EOM are normal. Pupils are equal, round, and reactive to light.  Neck: Normal range of motion. Neck supple.  Cardiovascular: Normal rate.     Pulmonary/Chest: Effort normal and breath sounds normal. No respiratory distress.  Abdominal: Soft.  Musculoskeletal: Normal range of motion. She exhibits no edema and no tenderness.  Neurological: She is alert and oriented to person, place, and time. She has normal reflexes.  Skin: Skin is warm and dry.  Psychiatric: She has a normal mood and affect.    ED Course  Procedures (including critical care time)   0915am Patient reassessed.  U/s results reviewed by myself .  Nursing notes reviewed. Gallbladder sludge and stones.   Labs Reviewed  POCT I-STAT, CHEM 8 - Abnormal; Notable for the following:    Glucose, Bld 100 (*)     Calcium, Ion 1.26 (*)     All other components within normal limits  CBC WITH DIFFERENTIAL  COMPREHENSIVE METABOLIC PANEL  LIPASE, BLOOD   No results found.   No diagnosis found.    MDM  26 yo female with intermittant R flank/ruq pain x 4 months.  U/s shows gallbladder dz. Pain and nausea controlled in ER. Rx for percocet and zofran.   Patient will follow up with [facility]  this week. I discussed with patient s & s that  she will return to the ER for including  n/v/ pain that  is  uncontrolled.  No blood in urine.  No kidney stones on u/s.     Liver enzymes unremarkable.  Lipase normal.          Remi Haggard, NP 08/03/12 [phone]

## 2012-08-03 NOTE — ED Provider Notes (Signed)
Medical screening examination/treatment/procedure(s) were performed by non-physician practitioner and as supervising physician I was immediately available for consultation/collaboration.   Hanley Seamen, MD 08/03/12 [phone]

## 2012-08-03 NOTE — ED Notes (Signed)
Pt states multiple cases of Rt flank pain undiagnosed.  Diarrhea present.  Nausea x 2 days with vomiting 2 days ago.

## 2012-08-08 ENCOUNTER — Encounter (HOSPITAL_COMMUNITY): Payer: Self-pay | Admitting: Emergency Medicine

## 2012-08-08 ENCOUNTER — Emergency Department (HOSPITAL_COMMUNITY)
Admission: EM | Admit: 2012-08-08 | Discharge: 2012-08-09 | Disposition: A | Discharge status: Home / Self Care | Payer: Medicaid Other | Attending: Emergency Medicine | Admitting: Emergency Medicine

## 2012-08-08 DIAGNOSIS — R1011 Right upper quadrant pain: Secondary | ICD-10-CM | POA: Insufficient documentation

## 2012-08-08 DIAGNOSIS — K8020 Calculus of gallbladder without cholecystitis without obstruction: Secondary | ICD-10-CM | POA: Insufficient documentation

## 2012-08-08 DIAGNOSIS — K8050 Calculus of bile duct without cholangitis or cholecystitis without obstruction: Secondary | ICD-10-CM

## 2012-08-08 HISTORY — DX: Calculus of gallbladder without cholecystitis without obstruction: K80.20

## 2012-08-08 LAB — CBC WITH DIFFERENTIAL/PLATELET
Basophils Absolute: 0 10*3/uL (ref 0.0–0.1)
Basophils Relative: 0 % (ref 0–1)
Eosinophils Relative: 0 % (ref 0–5)
HCT: 39.2 % (ref 36.0–46.0)
MCHC: 36 g/dL (ref 30.0–36.0)
MCV: 82.2 fL (ref 78.0–100.0)
Monocytes Absolute: 0.6 10*3/uL (ref 0.1–1.0)
RDW: 12.5 % (ref 11.5–15.5)

## 2012-08-08 LAB — COMPREHENSIVE METABOLIC PANEL
AST: 112 U/L — ABNORMAL HIGH (ref 0–37)
Albumin: 5.3 g/dL — ABNORMAL HIGH (ref 3.5–5.2)
Calcium: 10.3 mg/dL (ref 8.4–10.5)
Creatinine, Ser: 0.62 mg/dL (ref 0.50–1.10)

## 2012-08-08 NOTE — ED Notes (Signed)
Pt states she is having pain on her right side and in her back  Pt states last time she was here she had an ultrasound that showed gallstones   Pt states it feels like something inside tearing at her  Pt has nausea and diarrhea today  States had some vomiting a couple days ago

## 2012-08-08 NOTE — ED Notes (Signed)
Pt sts pain in UQ abd pain. Pt sts that she believes she is having a gallstones attack.

## 2012-08-08 NOTE — ED Notes (Signed)
Pt states she has an appt with dr Maisie Fus at [facility] next week

## 2012-08-09 ENCOUNTER — Emergency Department (HOSPITAL_COMMUNITY): Payer: Medicaid Other

## 2012-08-09 DIAGNOSIS — K8020 Calculus of gallbladder without cholecystitis without obstruction: Secondary | ICD-10-CM

## 2012-08-09 LAB — URINE MICROSCOPIC-ADD ON

## 2012-08-09 LAB — URINALYSIS, ROUTINE W REFLEX MICROSCOPIC
Glucose, UA: NEGATIVE mg/dL
Leukocytes, UA: NEGATIVE
Protein, ur: 30 mg/dL — AB
pH: 6 (ref 5.0–8.0)

## 2012-08-09 LAB — PREGNANCY, URINE: Preg Test, Ur: NEGATIVE

## 2012-08-09 MED ORDER — OXYCODONE-ACETAMINOPHEN 5-325 MG PO TABS
ORAL_TABLET | ORAL | Status: AC
Start: 2012-08-09 — End: 2012-08-19

## 2012-08-09 MED ORDER — ONDANSETRON HCL 4 MG PO TABS: 4.0000 mg | ORAL_TABLET | Freq: Three times a day (TID) | ORAL | Status: AC | PRN

## 2012-08-09 MED ORDER — SODIUM CHLORIDE 0.9 % IV SOLN: INTRAVENOUS | Status: DC

## 2012-08-09 MED ORDER — MORPHINE SULFATE 4 MG/ML IJ SOLN: 4.0000 mg | INTRAMUSCULAR | Status: DC | PRN

## 2012-08-09 MED ORDER — ONDANSETRON HCL 4 MG/2ML IJ SOLN: 4.0000 mg | INTRAMUSCULAR | Status: DC | PRN

## 2012-08-09 NOTE — ED Notes (Signed)
Patient transported to Ultrasound 

## 2012-08-09 NOTE — ED Notes (Signed)
MD at bedside. 

## 2012-08-09 NOTE — Consult Note (Signed)
Reason for Consult:cholelithiasis Referring Physician: Dr. Mcmanus  Katherine Lindsey is an 26 y.o. female.  HPI: this patient presents to the emergency room for evaluation of abdominal pain and back pain. She says that her current episode of pain began this afternoon after he hotdog for lunch. She says it began as back pain which feels "sharp" and radiates to her right side. She says that she's been having back pain since March but has been having more frequent episodes of abdominal pain and side pain as well as the back pain more recently. She is presented to the emergency room several times recently for similar complaints. She had some nausea and vomiting last week but not today with this episode. She had an ultrasound which demonstrated cholelithiasis without evidence of choledocholithiasis or evidence of cholecystitis. She is currently pain-free while talking to her. She says her bowels are normal and denies any blood in the stools or melena she denies any heartburn or urinary symptoms. She denies any fevers or chills. She had been seen previously in the emergency room and was diagnosed with cholelithiasis than. She was scheduled to see one of my partners in the clinic later this week for consultation and surgical evaluation.  Past Medical History  Diagnosis Date  . Gallstones     Past Surgical History  Procedure Date  . Induced abortion     2 weeks ago    History reviewed. No pertinent family history.  Social History:  reports that she has never smoked. She has never used smokeless tobacco. She reports that she does not drink alcohol or use illicit drugs.  Allergies: No Known Allergies  Medications: I have reviewed the patient's current medications.  Results for orders placed during the hospital encounter of 08/08/12 (from the past 48 hour(s))  CBC WITH DIFFERENTIAL     Status: Normal   Collection Time   08/08/12 11:14 PM      Component Value Range Comment   WBC 8.8  4.0 -  10.5 K/uL    RBC 4.77  3.87 - 5.11 MIL/uL    Hemoglobin 14.1  12.0 - 15.0 g/dL    HCT 39.2  36.0 - 46.0 %    MCV 82.2  78.0 - 100.0 fL    MCH 29.6  26.0 - 34.0 pg    MCHC 36.0  30.0 - 36.0 g/dL    RDW 12.5  11.5 - 15.5 %    Platelets 259  150 - 400 K/uL    Neutrophils Relative 75  43 - 77 %    Neutro Abs 6.6  1.7 - 7.7 K/uL    Lymphocytes Relative 19  12 - 46 %    Lymphs Abs 1.7  0.7 - 4.0 K/uL    Monocytes Relative 6  3 - 12 %    Monocytes Absolute 0.6  0.1 - 1.0 K/uL    Eosinophils Relative 0  0 - 5 %    Eosinophils Absolute 0.0  0.0 - 0.7 K/uL    Basophils Relative 0  0 - 1 %    Basophils Absolute 0.0  0.0 - 0.1 K/uL   COMPREHENSIVE METABOLIC PANEL     Status: Abnormal   Collection Time   08/08/12 11:14 PM      Component Value Range Comment   Sodium 135  135 - 145 mEq/L    Potassium 3.8  3.5 - 5.1 mEq/L    Chloride 99  96 - 112 mEq/L    CO2 24  19 -   32 mEq/L    Glucose, Bld 99  70 - 99 mg/dL    BUN 9  6 - 23 mg/dL    Creatinine, Ser 0.62  0.50 - 1.10 mg/dL    Calcium 10.3  8.4 - 10.5 mg/dL    Total Protein 9.2 (*) 6.0 - 8.3 g/dL    Albumin 5.3 (*) 3.5 - 5.2 g/dL    AST 112 (*) 0 - 37 U/L    ALT 61 (*) 0 - 35 U/L    Alkaline Phosphatase 96  39 - 117 U/L    Total Bilirubin 0.7  0.3 - 1.2 mg/dL    GFR calc non Af Amer >90  >90 mL/min    GFR calc Af Amer >90  >90 mL/min   LIPASE, BLOOD     Status: Normal   Collection Time   08/08/12 11:14 PM      Component Value Range Comment   Lipase 28  11 - 59 U/L   URINALYSIS, ROUTINE W REFLEX MICROSCOPIC     Status: Abnormal   Collection Time   08/08/12 11:53 PM      Component Value Range Comment   Color, Urine YELLOW  YELLOW    APPearance CLEAR  CLEAR    Specific Gravity, Urine 1.018  1.005 - 1.030    pH 6.0  5.0 - 8.0    Glucose, UA NEGATIVE  NEGATIVE mg/dL    Hgb urine dipstick NEGATIVE  NEGATIVE    Bilirubin Urine NEGATIVE  NEGATIVE    Ketones, ur 40 (*) NEGATIVE mg/dL    Protein, ur 30 (*) NEGATIVE mg/dL    Urobilinogen,  UA 1.0  0.0 - 1.0 mg/dL    Nitrite NEGATIVE  NEGATIVE    Leukocytes, UA NEGATIVE  NEGATIVE   PREGNANCY, URINE     Status: Normal   Collection Time   08/08/12 11:53 PM      Component Value Range Comment   Preg Test, Ur NEGATIVE  NEGATIVE   URINE MICROSCOPIC-ADD ON     Status: Abnormal   Collection Time   08/08/12 11:53 PM      Component Value Range Comment   Squamous Epithelial / LPF FEW (*) RARE    WBC, UA 0-2  <3 WBC/hpf    Urine-Other MUCOUS PRESENT       Us Abdomen Limited  08/09/2012  *RADIOLOGY REPORT*  Clinical Data: Abdominal pain  LIMITED ABDOMINAL ULTRASOUND  Comparison:  08/03/2012  Findings: Multiple gallstones and sludge.  No gallbladder wall thickening.  Negative sonographic Murphy's sign.  Normal caliber CBD, measuring 4 mm.  The distal most aspect of the duct is obscured by overlying bowel gas artifact.  Normal sonographic appearance to the liver.  IMPRESSION: Cholelithiasis and sludge.  No sonographic evidence for acute cholecystitis.  The distal CBD is obscured.  Where visualized, the CBD is of normal caliber.   Original Report Authenticated By: ANDREW J. DELGAIZO, M.D.     All other review of systems negative or noncontributory except as stated in the HPI  Blood pressure 113/62, pulse 64, temperature 97.6 F (36.4 C), temperature source Oral, resp. rate 22, last menstrual period 07/20/2012, SpO2 100.00%. General appearance: alert, cooperative and no distress Head: Normocephalic, without obvious abnormality, atraumatic Eyes: negative Neck: no JVD and supple, symmetrical, trachea midline Back: symmetric, no curvature. ROM normal. No CVA tenderness. Resp: clear to auscultation bilaterally Cardio: regular rate and rhythm, S1, S2 normal, no murmur, click, rub or gallop GI: soft, non-tender; bowel sounds normal; no   masses,  no organomegaly and no Murphy's and no tenderness now Extremities: extremities normal, atraumatic, no cyanosis or edema Pulses: 2+ and symmetric Skin:  Skin color, texture, turgor normal. No rashes or lesions Neurologic: Grossly normal  Assessment/Plan: Symptomatic cholelithiasis I think that her symptoms are most likely consistent with symptomatic cholelithiasis. Since this has been recurring frequently, I offered to admit her to the hospital and perform cholecystectomy as soon as available possibly later today. We discussed the surgery and the options and the risks and the benefits. The risks of infection, bleeding, pain, persistent symptoms, scarring, injury to bowel or bile ducts, retained stone, diarrhea, need for additional procedures, and need for open surgery discussed with the patient. She is understanding and would like to proceed with surgery. However, she says that she "needs time to prepare herself" and would like to schedule surgery. Since she is currently pain-free and looks well with no at its of cholecystitis on imaging, labs, or exam, I think it's reasonable to schedule this electively. Since she does not want to have the surgery later today, I will send a message to my office and have them schedule her for elective cholecystectomy as soon as available. I recommended that she return to the emergency room if she has persistent symptoms with unrelenting pain or fevers or chills or jaundice.   Jie Stickels DAVID 08/09/2012, 4:11 AM       

## 2012-08-09 NOTE — ED Provider Notes (Signed)
History     CSN: [id number]  Arrival date & time 08/08/12  2125   First MD Initiated Contact with Patient 08/09/12 0009      Chief Complaint  Patient presents with  . Abdominal Pain    HPI Pt was seen at 0020.  Per pt, c/o gradual onset and persistence of constant RUQ abd "pain" since approx 1500 yesterday PTA.  Pt has had intermittent RUQ pain for the past several months, has worsened over the past several days and become constant since yesterday.  States the pain radiates into her right back area.  Has been associated with multiple intermittent episodes of N/V/D.  Pt states her pain worsened today after she ate eggs with butter sausage for breakfast and ate hotdog for lunch.  Pt states she took percocet without relief.  Pt has been eval in the ED 5 days ago for same, dx cholelithiasis, and is scheduled to follow up with General Surgery this week.  Denies fevers, no rash, no CP/SOB, no cough, no black or blood in stools or emesis.       Past Medical History  Diagnosis Date  . Gallstones     Past Surgical History  Procedure Date  . Induced abortion     2 weeks ago    History  Substance Use Topics  . Smoking status: Never Smoker   . Smokeless tobacco: Never Used  . Alcohol Use: No    OB History    Grav Para Term Preterm Abortions TAB SAB Ect Mult Living   4 1   1  1   1       Review of Systems ROS: Statement: All systems negative except as marked or noted in the HPI; Constitutional: Negative for fever and chills. ; ; Eyes: Negative for eye pain, redness and discharge. ; ; ENMT: Negative for ear pain, hoarseness, nasal congestion, sinus pressure and sore throat. ; ; Cardiovascular: Negative for chest pain, palpitations, diaphoresis, dyspnea and peripheral edema. ; ; Respiratory: Negative for cough, wheezing and stridor. ; ; Gastrointestinal: +abd pain, N/V/D. Negative for blood in stool, hematemesis, jaundice and rectal bleeding. . ; ; Genitourinary: Negative for dysuria,  flank pain and hematuria. ; ; Musculoskeletal: Negative for back pain and neck pain. Negative for swelling and trauma.; ; Skin: Negative for pruritus, rash, abrasions, blisters, bruising and skin lesion.; ; Neuro: Negative for headache, lightheadedness and neck stiffness. Negative for weakness, altered level of consciousness , altered mental status, extremity weakness, paresthesias, involuntary movement, seizure and syncope.        Allergies  Review of patient's allergies indicates no known allergies.  Home Medications   Current Outpatient Rx  Name Route Sig Dispense Refill  . IBUPROFEN 200 MG PO TABS Oral Take 600 mg by mouth every 6 (six) hours as needed. For back pain.    Marland Kitchen ONDANSETRON HCL 4 MG PO TABS Oral Take 1 tablet (4 mg total) by mouth every 6 (six) hours. 12 tablet 0  . OXYCODONE-ACETAMINOPHEN 5-325 MG PO TABS Oral Take 2 tablets by mouth every 4 (four) hours as needed for pain. 15 tablet 0    BP 132/73  Pulse 79  Temp 97.6 F (36.4 C) (Oral)  Resp 20  SpO2 100%  LMP 07/20/2012  Physical Exam 0025: Physical examination:  Nursing notes reviewed; Vital signs and O2 SAT reviewed;  Constitutional: Well developed, Well nourished, Well hydrated, Uncomfortable appearing; Head:  Normocephalic, atraumatic; Eyes: EOMI, PERRL, No scleral icterus; ENMT: Mouth and pharynx normal,  Mucous membranes moist; Neck: Supple, Full range of motion, No lymphadenopathy; Cardiovascular: Regular rate and rhythm, No murmur, rub, or gallop; Respiratory: Breath sounds clear & equal bilaterally, No rales, rhonchi, wheezes.  Speaking full sentences with ease, Normal respiratory effort/excursion; Chest: Nontender, Movement normal; Abdomen: Soft, +RUQ and mid-epigastric areas tender to palp.  No rebound. Nondistended, Normal bowel sounds;; Extremities: Pulses normal, No tenderness, No edema, No calf edema or asymmetry.; Neuro: AA&Ox3, Major CN grossly intact.  Speech clear. No gross focal motor or sensory  deficits in extremities.; Skin: Color normal, Warm, Dry.   ED Course  Procedures    MDM  MDM Reviewed: previous chart, nursing note and vitals Reviewed previous: labs and ultrasound Interpretation: labs and ultrasound     Results for orders placed during the hospital encounter of 08/08/12  CBC WITH DIFFERENTIAL      Component Value Range   WBC 8.8  4.0 - 10.5 K/uL   RBC 4.77  3.87 - 5.11 MIL/uL   Hemoglobin 14.1  12.0 - 15.0 g/dL   HCT 16.1  09.6 - 04.5 %   MCV 82.2  78.0 - 100.0 fL   MCH 29.6  26.0 - 34.0 pg   MCHC 36.0  30.0 - 36.0 g/dL   RDW 40.9  81.1 - 91.4 %   Platelets 259  150 - 400 K/uL   Neutrophils Relative 75  43 - 77 %   Neutro Abs 6.6  1.7 - 7.7 K/uL   Lymphocytes Relative 19  12 - 46 %   Lymphs Abs 1.7  0.7 - 4.0 K/uL   Monocytes Relative 6  3 - 12 %   Monocytes Absolute 0.6  0.1 - 1.0 K/uL   Eosinophils Relative 0  0 - 5 %   Eosinophils Absolute 0.0  0.0 - 0.7 K/uL   Basophils Relative 0  0 - 1 %   Basophils Absolute 0.0  0.0 - 0.1 K/uL  COMPREHENSIVE METABOLIC PANEL      Component Value Range   Sodium 135  135 - 145 mEq/L   Potassium 3.8  3.5 - 5.1 mEq/L   Chloride 99  96 - 112 mEq/L   CO2 24  19 - 32 mEq/L   Glucose, Bld 99  70 - 99 mg/dL   BUN 9  6 - 23 mg/dL   Creatinine, Ser 7.82  0.50 - 1.10 mg/dL   Calcium 95.6  8.4 - 21.3 mg/dL   Total Protein 9.2 (*) 6.0 - 8.3 g/dL   Albumin 5.3 (*) 3.5 - 5.2 g/dL   AST 086 (*) 0 - 37 U/L   ALT 61 (*) 0 - 35 U/L   Alkaline Phosphatase 96  39 - 117 U/L   Total Bilirubin 0.7  0.3 - 1.2 mg/dL   GFR calc non Af Amer >90  >90 mL/min   GFR calc Af Amer >90  >90 mL/min  LIPASE, BLOOD      Component Value Range   Lipase 28  11 - 59 U/L  URINALYSIS, ROUTINE W REFLEX MICROSCOPIC      Component Value Range   Color, Urine YELLOW  YELLOW   APPearance CLEAR  CLEAR   Specific Gravity, Urine 1.018  1.005 - 1.030   pH 6.0  5.0 - 8.0   Glucose, UA NEGATIVE  NEGATIVE mg/dL   Hgb urine dipstick NEGATIVE  NEGATIVE     Bilirubin Urine NEGATIVE  NEGATIVE   Ketones, ur 40 (*) NEGATIVE mg/dL   Protein, ur 30 (*)  NEGATIVE mg/dL   Urobilinogen, UA 1.0  0.0 - 1.0 mg/dL   Nitrite NEGATIVE  NEGATIVE   Leukocytes, UA NEGATIVE  NEGATIVE  PREGNANCY, URINE      Component Value Range   Preg Test, Ur NEGATIVE  NEGATIVE  URINE MICROSCOPIC-ADD ON      Component Value Range   Squamous Epithelial / LPF FEW (*) RARE   WBC, UA 0-2  <3 WBC/hpf   Urine-Other MUCOUS PRESENT      US Abdomen Limited 08/09/2012  *RADIOLOGY REPORT*  Clinical Data: Abdominal pain  LIMITED ABDOMINAL ULTRASOUND  Comparison:  08/03/2012  Findings: Multiple gallstones and sludge.  No gallbladder wall thickening.  Negative sonographic Murphy's sign.  Normal caliber CBD, measuring 4 mm.  The distal most aspect of the duct is obscured by overlying bowel gas artifact.  Normal sonographic appearance to the liver.  IMPRESSION: Cholelithiasis and sludge.  No sonographic evidence for acute cholecystitis.  The distal CBD is obscured.  Where visualized, the CBD is of normal caliber.   Original Report Authenticated By: Waneta Martins, M.D.    Results for MELANY, WIESMAN (MRN [id number]) as of 08/10/2012 18:18  Ref. Range 08/03/2012 06:18 08/08/2012 23:14  AST Latest Range: 0-37 U/L 17 112 (H)  ALT Latest Range: 0-35 U/L 11 61 (H)    0230:  Pain improved after meds. No vomiting or stooling while in the ED.  Pt non-compliant with diet and pt has been seen in the ED twice in the past 5 days for same.  Pt states she is scheduled for "pre-op testing" this Thursday to "have my GB taken out."  Dx testing d/w pt and family.  Questions answered.  Verb understanding, agreeable to talk with General Surgeon in the ED.  T/C to Dr. Biagio Quint, case discussed, including:  HPI, pertinent PM/SHx, VS/PE, dx testing, ED course and treatment:  Agreeable to consult in the ED for possible admit.   0400: General Surgeon Dr. Darylene Price eval:  Pt now does not want to be admitted to have  cholecystectomy because she "needs time to prepare myself for it."  Surgeon to f/u this week, requests EDP to d/c.       Laray Anger, DO 08/10/12 Rickey Primus

## 2012-08-11 ENCOUNTER — Telehealth (INDEPENDENT_AMBULATORY_CARE_PROVIDER_SITE_OTHER): Payer: Self-pay | Admitting: General Surgery

## 2012-08-11 NOTE — Telephone Encounter (Signed)
Patient called in saying she spoke to Dr. Biagio Quint while at the hospital and was told that she does not have to come in to our office to be seen in order to have surgery scheduled since he saw her at the hospital. Confirmed she has an appointment with Dr. Maisie Fus on 08/14/12 and advised to keep that appointment. Advised that because of insurance and that I did not see a notation from Dr. Biagio Quint in her chart she needs to plan on coming in on 08/14/12 to make sure that she is compliant with her insurance. Advised her that the message will be forwarded to Dr. Biagio Quint, Dr. Maisie Fus and Dr. Delice Lesch nurse to determine what is next. Patient having abdominal pain due to gallstones.

## 2012-08-12 ENCOUNTER — Other Ambulatory Visit (INDEPENDENT_AMBULATORY_CARE_PROVIDER_SITE_OTHER): Payer: Self-pay | Admitting: General Surgery

## 2012-08-12 ENCOUNTER — Telehealth (INDEPENDENT_AMBULATORY_CARE_PROVIDER_SITE_OTHER): Payer: Self-pay | Admitting: General Surgery

## 2012-08-12 DIAGNOSIS — K8020 Calculus of gallbladder without cholecystitis without obstruction: Secondary | ICD-10-CM

## 2012-08-12 NOTE — Telephone Encounter (Signed)
Patient called in wanting to obtain status of any discussion between Dr. Biagio Quint and Dr. Maisie Fus. Advised Dr. Biagio Quint in the office this afternoon and the message from yesterday was already forwarded, will request he contact her this afternoon between patients. Patient agreed.

## 2012-08-13 ENCOUNTER — Ambulatory Visit (INDEPENDENT_AMBULATORY_CARE_PROVIDER_SITE_OTHER): Payer: Medicaid Other | Admitting: General Surgery

## 2012-08-14 ENCOUNTER — Ambulatory Visit (INDEPENDENT_AMBULATORY_CARE_PROVIDER_SITE_OTHER): Payer: Medicaid Other | Admitting: General Surgery

## 2012-08-15 ENCOUNTER — Ambulatory Visit (INDEPENDENT_AMBULATORY_CARE_PROVIDER_SITE_OTHER): Payer: Medicaid Other | Admitting: General Surgery

## 2012-08-25 ENCOUNTER — Ambulatory Visit (INDEPENDENT_AMBULATORY_CARE_PROVIDER_SITE_OTHER): Payer: Medicaid Other | Admitting: General Surgery

## 2012-08-26 ENCOUNTER — Encounter (HOSPITAL_COMMUNITY): Payer: Self-pay

## 2012-08-26 ENCOUNTER — Encounter (HOSPITAL_COMMUNITY)
Admission: RE | Admit: 2012-08-26 | Discharge: 2012-08-26 | Disposition: A | Discharge status: Home / Self Care | Payer: Medicaid Other | Source: Ambulatory Visit | Attending: General Surgery | Admitting: General Surgery

## 2012-08-26 VITALS — HR 70 | Temp 98.5°F | Resp 20 | Ht 68.0 in | Wt 141.3 lb

## 2012-08-26 DIAGNOSIS — K8020 Calculus of gallbladder without cholecystitis without obstruction: Secondary | ICD-10-CM

## 2012-08-26 LAB — CBC
Platelets: 246 10*3/uL (ref 150–400)
RBC: 4.24 MIL/uL (ref 3.87–5.11)
RDW: 12.7 % (ref 11.5–15.5)
WBC: 5 10*3/uL (ref 4.0–10.5)

## 2012-08-26 LAB — SURGICAL PCR SCREEN
MRSA, PCR: NEGATIVE
Staphylococcus aureus: NEGATIVE

## 2012-08-26 LAB — HCG, SERUM, QUALITATIVE: Preg, Serum: NEGATIVE

## 2012-08-26 NOTE — Pre-Procedure Instructions (Signed)
20 Katherine Lindsey  08/26/2012   Your procedure is scheduled on:  September 12  Report to [facility] at 05:30 AM.  Call this number if you have problems the morning of surgery: [phone]   Remember:   Do not eat or drink food:After Midnight.  Take these medicines the morning of surgery with A SIP OF WATER: Percocet    STOP Ibuprofen  Do not wear jewelry, make-up or nail polish.  Do not wear lotions, powders, or perfumes. You may wear deodorant.  Do not shave 48 hours prior to surgery. Men may shave face and neck.  Do not bring valuables to the hospital.  Contacts, dentures or bridgework may not be worn into surgery.  Leave suitcase in the car. After surgery it may be brought to your room.  For patients admitted to the hospital, checkout time is 11:00 AM the day of discharge.   Patients discharged the day of surgery will not be allowed to drive home.  Name and phone number of your driver: Charlott Rakes   Special Instructions: CHG Shower Use Special Wash: 1/2 bottle night before surgery and 1/2 bottle morning of surgery.   Please read over the following fact sheets that you were given: Pain Booklet, Coughing and Deep Breathing and Surgical Site Infection Prevention

## 2012-08-26 NOTE — Progress Notes (Signed)
Called and left patient message to call back concerning PAT visit

## 2012-08-27 MED ORDER — CEFAZOLIN SODIUM-DEXTROSE 2-3 GM-% IV SOLR
2.0000 g | INTRAVENOUS | Status: AC
  Administered 2012-08-28: 2 g via INTRAVENOUS
  Filled 2012-08-27: qty 50

## 2012-08-28 ENCOUNTER — Encounter (HOSPITAL_COMMUNITY)
Admission: RE | Disposition: A | Discharge status: Home / Self Care | Payer: Self-pay | Source: Ambulatory Visit | Attending: General Surgery

## 2012-08-28 ENCOUNTER — Encounter (HOSPITAL_COMMUNITY): Payer: Self-pay | Admitting: *Deleted

## 2012-08-28 ENCOUNTER — Encounter (HOSPITAL_COMMUNITY): Payer: Self-pay | Admitting: Anesthesiology

## 2012-08-28 ENCOUNTER — Ambulatory Visit (HOSPITAL_COMMUNITY): Payer: Medicaid Other

## 2012-08-28 ENCOUNTER — Ambulatory Visit (HOSPITAL_COMMUNITY): Payer: Medicaid Other | Admitting: Anesthesiology

## 2012-08-28 ENCOUNTER — Ambulatory Visit (HOSPITAL_COMMUNITY)
Admission: RE | Admit: 2012-08-28 | Discharge: 2012-08-28 | Disposition: A | Discharge status: Home / Self Care | Payer: Medicaid Other | Source: Ambulatory Visit | Attending: General Surgery | Admitting: General Surgery

## 2012-08-28 DIAGNOSIS — Z01812 Encounter for preprocedural laboratory examination: Secondary | ICD-10-CM | POA: Insufficient documentation

## 2012-08-28 DIAGNOSIS — K8020 Calculus of gallbladder without cholecystitis without obstruction: Secondary | ICD-10-CM | POA: Insufficient documentation

## 2012-08-28 DIAGNOSIS — K8010 Calculus of gallbladder with chronic cholecystitis without obstruction: Secondary | ICD-10-CM

## 2012-08-28 HISTORY — PX: CHOLECYSTECTOMY: SHX55

## 2012-08-28 SURGERY — LAPAROSCOPIC CHOLECYSTECTOMY WITH INTRAOPERATIVE CHOLANGIOGRAM
Anesthesia: General | Wound class: Contaminated

## 2012-08-28 MED ORDER — MEPERIDINE HCL 25 MG/ML IJ SOLN
INTRAMUSCULAR | Status: AC
  Administered 2012-08-28: 12.5 mg via INTRAVENOUS
  Filled 2012-08-28: qty 1

## 2012-08-28 MED ORDER — ACETAMINOPHEN 10 MG/ML IV SOLN
INTRAVENOUS | Status: AC
  Filled 2012-08-28: qty 100

## 2012-08-28 MED ORDER — LIDOCAINE HCL (CARDIAC) 20 MG/ML IV SOLN
INTRAVENOUS | Status: DC | PRN
  Administered 2012-08-28: 100 mg via INTRAVENOUS

## 2012-08-28 MED ORDER — FENTANYL CITRATE 0.05 MG/ML IJ SOLN
25.0000 ug | INTRAMUSCULAR | Status: DC | PRN
  Administered 2012-08-28 (×2): 25 ug via INTRAVENOUS

## 2012-08-28 MED ORDER — FENTANYL CITRATE 0.05 MG/ML IJ SOLN
INTRAMUSCULAR | Status: DC | PRN
  Administered 2012-08-28 (×3): 50 ug via INTRAVENOUS
  Administered 2012-08-28: 150 ug via INTRAVENOUS
  Administered 2012-08-28: 50 ug via INTRAVENOUS

## 2012-08-28 MED ORDER — OXYCODONE-ACETAMINOPHEN 5-325 MG PO TABS: 1.0000 | ORAL_TABLET | ORAL | Status: AC | PRN

## 2012-08-28 MED ORDER — OXYCODONE-ACETAMINOPHEN 5-325 MG PO TABS
ORAL_TABLET | ORAL | Status: AC
  Filled 2012-08-28: qty 1

## 2012-08-28 MED ORDER — SODIUM CHLORIDE 0.9 % IR SOLN
Status: DC | PRN
  Administered 2012-08-28: 1000 mL

## 2012-08-28 MED ORDER — ACETAMINOPHEN 10 MG/ML IV SOLN
INTRAVENOUS | Status: DC | PRN
  Administered 2012-08-28: 1000 mg via INTRAVENOUS

## 2012-08-28 MED ORDER — LIDOCAINE-EPINEPHRINE (PF) 1 %-1:200000 IJ SOLN
INTRAMUSCULAR | Status: AC
  Filled 2012-08-28: qty 10

## 2012-08-28 MED ORDER — LACTATED RINGERS IV SOLN: INTRAVENOUS | Status: DC

## 2012-08-28 MED ORDER — FENTANYL CITRATE 0.05 MG/ML IJ SOLN
INTRAMUSCULAR | Status: AC
  Filled 2012-08-28: qty 2

## 2012-08-28 MED ORDER — 0.9 % SODIUM CHLORIDE (POUR BTL) OPTIME
TOPICAL | Status: DC | PRN
  Administered 2012-08-28: 2000 mL

## 2012-08-28 MED ORDER — NEOSTIGMINE METHYLSULFATE 1 MG/ML IJ SOLN
INTRAMUSCULAR | Status: DC | PRN
  Administered 2012-08-28: 5 mg via INTRAVENOUS

## 2012-08-28 MED ORDER — BUPIVACAINE HCL (PF) 0.25 % IJ SOLN
INTRAMUSCULAR | Status: AC
  Filled 2012-08-28: qty 30

## 2012-08-28 MED ORDER — LACTATED RINGERS IV SOLN
INTRAVENOUS | Status: DC | PRN
  Administered 2012-08-28 (×3): via INTRAVENOUS

## 2012-08-28 MED ORDER — SODIUM CHLORIDE 0.9 % IV SOLN
INTRAVENOUS | Status: DC | PRN
  Administered 2012-08-28: 09:00:00

## 2012-08-28 MED ORDER — ROCURONIUM BROMIDE 100 MG/10ML IV SOLN
INTRAVENOUS | Status: DC | PRN
  Administered 2012-08-28: 40 mg via INTRAVENOUS

## 2012-08-28 MED ORDER — OXYCODONE-ACETAMINOPHEN 5-325 MG PO TABS
1.0000 | ORAL_TABLET | ORAL | Status: DC | PRN
  Administered 2012-08-28 (×2): 1 via ORAL

## 2012-08-28 MED ORDER — DEXAMETHASONE SODIUM PHOSPHATE 4 MG/ML IJ SOLN
INTRAMUSCULAR | Status: DC | PRN
  Administered 2012-08-28: 8 mg via INTRAVENOUS

## 2012-08-28 MED ORDER — ONDANSETRON HCL 4 MG/2ML IJ SOLN
INTRAMUSCULAR | Status: DC | PRN
  Administered 2012-08-28 (×2): 4 mg via INTRAVENOUS

## 2012-08-28 MED ORDER — LIDOCAINE-EPINEPHRINE (PF) 1 %-1:200000 IJ SOLN
INTRAMUSCULAR | Status: DC | PRN
  Administered 2012-08-28: 09:00:00 via SUBCUTANEOUS

## 2012-08-28 MED ORDER — MEPERIDINE HCL 25 MG/ML IJ SOLN
6.2500 mg | INTRAMUSCULAR | Status: DC | PRN
  Administered 2012-08-28: 12.5 mg via INTRAVENOUS

## 2012-08-28 MED ORDER — PROMETHAZINE HCL 25 MG/ML IJ SOLN: 6.2500 mg | INTRAMUSCULAR | Status: DC | PRN

## 2012-08-28 MED ORDER — GLYCOPYRROLATE 0.2 MG/ML IJ SOLN
INTRAMUSCULAR | Status: DC | PRN
  Administered 2012-08-28: .8 mg via INTRAVENOUS

## 2012-08-28 MED ORDER — PROPOFOL 10 MG/ML IV BOLUS
INTRAVENOUS | Status: DC | PRN
  Administered 2012-08-28: 150 mg via INTRAVENOUS
  Administered 2012-08-28 (×2): 50 mg via INTRAVENOUS

## 2012-08-28 MED ORDER — MIDAZOLAM HCL 5 MG/5ML IJ SOLN
INTRAMUSCULAR | Status: DC | PRN
  Administered 2012-08-28: 2 mg via INTRAVENOUS

## 2012-08-28 SURGICAL SUPPLY — 51 items
ADH SKN CLS APL DERMABOND .7 (GAUZE/BANDAGES/DRESSINGS) ×1
APPLIER CLIP ROT 10 11.4 M/L (STAPLE) ×2
APR CLP MED LRG 11.4X10 (STAPLE) ×1
BAG SPEC RTRVL LRG 6X4 10 (ENDOMECHANICALS) ×1
BLADE SURG ROTATE 9660 (MISCELLANEOUS) IMPLANT
CANISTER SUCTION 2500CC (MISCELLANEOUS) ×2 IMPLANT
CATH REDDICK CHOLANGI 4FR 50CM (CATHETERS) ×2 IMPLANT
CHLORAPREP W/TINT 26ML (MISCELLANEOUS) ×2 IMPLANT
CLIP APPLIE ROT 10 11.4 M/L (STAPLE) ×1 IMPLANT
CLOTH BEACON ORANGE TIMEOUT ST (SAFETY) ×2 IMPLANT
COVER SURGICAL LIGHT HANDLE (MISCELLANEOUS) ×2 IMPLANT
DECANTER SPIKE VIAL GLASS SM (MISCELLANEOUS) ×4 IMPLANT
DERMABOND ADVANCED (GAUZE/BANDAGES/DRESSINGS) ×1
DERMABOND ADVANCED .7 DNX12 (GAUZE/BANDAGES/DRESSINGS) ×1 IMPLANT
DRAPE C-ARM 42X72 X-RAY (DRAPES) ×2 IMPLANT
ELECT CAUTERY BLADE 6.4 (BLADE) ×2 IMPLANT
ELECT REM PT RETURN 9FT ADLT (ELECTROSURGICAL) ×2
ELECTRODE REM PT RTRN 9FT ADLT (ELECTROSURGICAL) ×1 IMPLANT
ENDOLOOP SUT PDS II  0 18 (SUTURE) ×1
ENDOLOOP SUT PDS II 0 18 (SUTURE) IMPLANT
GLOVE BIOGEL PI IND STRL 6.5 (GLOVE) IMPLANT
GLOVE BIOGEL PI IND STRL 7.0 (GLOVE) IMPLANT
GLOVE BIOGEL PI IND STRL 7.5 (GLOVE) IMPLANT
GLOVE BIOGEL PI INDICATOR 6.5 (GLOVE) ×1
GLOVE BIOGEL PI INDICATOR 7.0 (GLOVE) ×1
GLOVE BIOGEL PI INDICATOR 7.5 (GLOVE) ×1
GLOVE SURG SS PI 6.5 STRL IVOR (GLOVE) ×1 IMPLANT
GLOVE SURG SS PI 7.0 STRL IVOR (GLOVE) ×1 IMPLANT
GLOVE SURG SS PI 7.5 STRL IVOR (GLOVE) ×4 IMPLANT
GOWN PREVENTION PLUS XLARGE (GOWN DISPOSABLE) ×2 IMPLANT
GOWN STRL NON-REIN LRG LVL3 (GOWN DISPOSABLE) ×5 IMPLANT
IV CATH 14GX2 1/4 (CATHETERS) ×2 IMPLANT
KIT BASIN OR (CUSTOM PROCEDURE TRAY) ×2 IMPLANT
KIT ROOM TURNOVER OR (KITS) ×2 IMPLANT
NS IRRIG 1000ML POUR BTL (IV SOLUTION) ×2 IMPLANT
PAD ARMBOARD 7.5X6 YLW CONV (MISCELLANEOUS) ×2 IMPLANT
PENCIL BUTTON HOLSTER BLD 10FT (ELECTRODE) ×2 IMPLANT
POUCH SPECIMEN RETRIEVAL 10MM (ENDOMECHANICALS) ×2 IMPLANT
SCISSORS LAP 5X35 DISP (ENDOMECHANICALS) IMPLANT
SET IRRIG TUBING LAPAROSCOPIC (IRRIGATION / IRRIGATOR) ×2 IMPLANT
SLEEVE ENDOPATH XCEL 5M (ENDOMECHANICALS) ×2 IMPLANT
SPECIMEN JAR SMALL (MISCELLANEOUS) ×2 IMPLANT
SUT MNCRL AB 4-0 PS2 18 (SUTURE) ×4 IMPLANT
SUT VICRYL 0 UR6 27IN ABS (SUTURE) ×2 IMPLANT
TOWEL OR 17X24 6PK STRL BLUE (TOWEL DISPOSABLE) ×2 IMPLANT
TOWEL OR 17X26 10 PK STRL BLUE (TOWEL DISPOSABLE) ×2 IMPLANT
TRAY FOLEY CATH 14FR (SET/KITS/TRAYS/PACK) IMPLANT
TRAY LAPAROSCOPIC (CUSTOM PROCEDURE TRAY) ×2 IMPLANT
TROCAR BALLN 12MMX100 BLUNT (TROCAR) ×2 IMPLANT
TROCAR XCEL NON-BLD 11X100MML (ENDOMECHANICALS) ×2 IMPLANT
TROCAR XCEL NON-BLD 5MMX100MML (ENDOMECHANICALS) ×2 IMPLANT

## 2012-08-28 NOTE — H&P (View-Only) (Signed)
Reason for Consult:cholelithiasis Referring Physician: Dr. Audie Clear is an 26 y.o. female.  HPI: this patient presents to the emergency room for evaluation of abdominal pain and back pain. She says that her current episode of pain began this afternoon after he hotdog for lunch. She says it began as back pain which feels "sharp" and radiates to her right side. She says that she's been having back pain since March but has been having more frequent episodes of abdominal pain and side pain as well as the back pain more recently. She is presented to the emergency room several times recently for similar complaints. She had some nausea and vomiting last week but not today with this episode. She had an ultrasound which demonstrated cholelithiasis without evidence of choledocholithiasis or evidence of cholecystitis. She is currently pain-free while talking to her. She says her bowels are normal and denies any blood in the stools or melena she denies any heartburn or urinary symptoms. She denies any fevers or chills. She had been seen previously in the emergency room and was diagnosed with cholelithiasis than. She was scheduled to see one of my partners in the clinic later this week for consultation and surgical evaluation.  Past Medical History  Diagnosis Date  . Gallstones     Past Surgical History  Procedure Date  . Induced abortion     2 weeks ago    History reviewed. No pertinent family history.  Social History:  reports that she has never smoked. She has never used smokeless tobacco. She reports that she does not drink alcohol or use illicit drugs.  Allergies: No Known Allergies  Medications: I have reviewed the patient's current medications.  Results for orders placed during the hospital encounter of 08/08/12 (from the past 48 hour(s))  CBC WITH DIFFERENTIAL     Status: Normal   Collection Time   08/08/12 11:14 PM      Component Value Range Comment   WBC 8.8  4.0 -  10.5 K/uL    RBC 4.77  3.87 - 5.11 MIL/uL    Hemoglobin 14.1  12.0 - 15.0 g/dL    HCT 46.9  62.9 - 52.8 %    MCV 82.2  78.0 - 100.0 fL    MCH 29.6  26.0 - 34.0 pg    MCHC 36.0  30.0 - 36.0 g/dL    RDW 41.3  24.4 - 01.0 %    Platelets 259  150 - 400 K/uL    Neutrophils Relative 75  43 - 77 %    Neutro Abs 6.6  1.7 - 7.7 K/uL    Lymphocytes Relative 19  12 - 46 %    Lymphs Abs 1.7  0.7 - 4.0 K/uL    Monocytes Relative 6  3 - 12 %    Monocytes Absolute 0.6  0.1 - 1.0 K/uL    Eosinophils Relative 0  0 - 5 %    Eosinophils Absolute 0.0  0.0 - 0.7 K/uL    Basophils Relative 0  0 - 1 %    Basophils Absolute 0.0  0.0 - 0.1 K/uL   COMPREHENSIVE METABOLIC PANEL     Status: Abnormal   Collection Time   08/08/12 11:14 PM      Component Value Range Comment   Sodium 135  135 - 145 mEq/L    Potassium 3.8  3.5 - 5.1 mEq/L    Chloride 99  96 - 112 mEq/L    CO2 24  19 -  32 mEq/L    Glucose, Bld 99  70 - 99 mg/dL    BUN 9  6 - 23 mg/dL    Creatinine, Ser 9.14  0.50 - 1.10 mg/dL    Calcium 78.2  8.4 - 10.5 mg/dL    Total Protein 9.2 (*) 6.0 - 8.3 g/dL    Albumin 5.3 (*) 3.5 - 5.2 g/dL    AST 956 (*) 0 - 37 U/L    ALT 61 (*) 0 - 35 U/L    Alkaline Phosphatase 96  39 - 117 U/L    Total Bilirubin 0.7  0.3 - 1.2 mg/dL    GFR calc non Af Amer >90  >90 mL/min    GFR calc Af Amer >90  >90 mL/min   LIPASE, BLOOD     Status: Normal   Collection Time   08/08/12 11:14 PM      Component Value Range Comment   Lipase 28  11 - 59 U/L   URINALYSIS, ROUTINE W REFLEX MICROSCOPIC     Status: Abnormal   Collection Time   08/08/12 11:53 PM      Component Value Range Comment   Color, Urine YELLOW  YELLOW    APPearance CLEAR  CLEAR    Specific Gravity, Urine 1.018  1.005 - 1.030    pH 6.0  5.0 - 8.0    Glucose, UA NEGATIVE  NEGATIVE mg/dL    Hgb urine dipstick NEGATIVE  NEGATIVE    Bilirubin Urine NEGATIVE  NEGATIVE    Ketones, ur 40 (*) NEGATIVE mg/dL    Protein, ur 30 (*) NEGATIVE mg/dL    Urobilinogen,  UA 1.0  0.0 - 1.0 mg/dL    Nitrite NEGATIVE  NEGATIVE    Leukocytes, UA NEGATIVE  NEGATIVE   PREGNANCY, URINE     Status: Normal   Collection Time   08/08/12 11:53 PM      Component Value Range Comment   Preg Test, Ur NEGATIVE  NEGATIVE   URINE MICROSCOPIC-ADD ON     Status: Abnormal   Collection Time   08/08/12 11:53 PM      Component Value Range Comment   Squamous Epithelial / LPF FEW (*) RARE    WBC, UA 0-2  <3 WBC/hpf    Urine-Other MUCOUS PRESENT       US Abdomen Limited  08/09/2012  *RADIOLOGY REPORT*  Clinical Data: Abdominal pain  LIMITED ABDOMINAL ULTRASOUND  Comparison:  08/03/2012  Findings: Multiple gallstones and sludge.  No gallbladder wall thickening.  Negative sonographic Murphy's sign.  Normal caliber CBD, measuring 4 mm.  The distal most aspect of the duct is obscured by overlying bowel gas artifact.  Normal sonographic appearance to the liver.  IMPRESSION: Cholelithiasis and sludge.  No sonographic evidence for acute cholecystitis.  The distal CBD is obscured.  Where visualized, the CBD is of normal caliber.   Original Report Authenticated By: Waneta Martins, M.D.     All other review of systems negative or noncontributory except as stated in the HPI  Blood pressure 113/62, pulse 64, temperature 97.6 F (36.4 C), temperature source Oral, resp. rate 22, last menstrual period 07/20/2012, SpO2 100.00%. General appearance: alert, cooperative and no distress Head: Normocephalic, without obvious abnormality, atraumatic Eyes: negative Neck: no JVD and supple, symmetrical, trachea midline Back: symmetric, no curvature. ROM normal. No CVA tenderness. Resp: clear to auscultation bilaterally Cardio: regular rate and rhythm, S1, S2 normal, no murmur, click, rub or gallop GI: soft, non-tender; bowel sounds normal; no  masses,  no organomegaly and no Murphy's and no tenderness now Extremities: extremities normal, atraumatic, no cyanosis or edema Pulses: 2+ and symmetric Skin:  Skin color, texture, turgor normal. No rashes or lesions Neurologic: Grossly normal  Assessment/Plan: Symptomatic cholelithiasis I think that her symptoms are most likely consistent with symptomatic cholelithiasis. Since this has been recurring frequently, I offered to admit her to the hospital and perform cholecystectomy as soon as available possibly later today. We discussed the surgery and the options and the risks and the benefits. The risks of infection, bleeding, pain, persistent symptoms, scarring, injury to bowel or bile ducts, retained stone, diarrhea, need for additional procedures, and need for open surgery discussed with the patient. She is understanding and would like to proceed with surgery. However, she says that she "needs time to prepare herself" and would like to schedule surgery. Since she is currently pain-free and looks well with no at its of cholecystitis on imaging, labs, or exam, I think it's reasonable to schedule this electively. Since she does not want to have the surgery later today, I will send a message to my office and have them schedule her for elective cholecystectomy as soon as available. I recommended that she return to the emergency room if she has persistent symptoms with unrelenting pain or fevers or chills or jaundice.   [name] DAVID 08/09/2012, 4:11 AM

## 2012-08-28 NOTE — Preoperative (Signed)
Beta Blockers   Reason not to administer Beta Blockers:Not Applicable 

## 2012-08-28 NOTE — Op Note (Signed)
NAME:  Katherine Lindsey, Katherine Lindsey NO.:  [id number]  MEDICAL RECORD NO.:  [id number]  LOCATION:  [facility]                         FACILITY:  [facility]  PHYSICIAN:  [name], MD       DATE OF BIRTH:  May 31, 1986  DATE OF PROCEDURE:  08/28/2012 DATE OF DISCHARGE:                              OPERATIVE REPORT   PREOPERATIVE DIAGNOSIS:  Symptomatic cholelithiasis.  POSTOPERATIVE DIAGNOSIS:  Symptomatic cholelithiasis.  PROCEDURE:  Laparoscopic cholecystectomy with intraoperative cholangiogram.  SURGEON:  [name], MD  ASSISTANT:  None.  ANESTHESIA:  General endotracheal anesthesia with 20 mL of 1% lidocaine with epinephrine.  FLUIDS:  2 L crystalloid.  ESTIMATED BLOOD LOSS:  Minimal.  DRAINS:  None.  SPECIMENS:  Gallbladder and contents sent to Pathology for permanent sectioning.  COMPLICATIONS:  None apparent.  INDICATION FOR PROCEDURE:  Katherine Lindsey is a 26 year old female with several episodes of back pain and upper abdominal discomfort.  She has actually been to the emergency room twice before for evaluation.  She was offered cholecystectomy at her last visit in the emergency room, but she declined and wanted to pursue this electively.  Since then, she has had 1 episode of similar discomfort.  OPERATIVE DETAILS:  Katherine Lindsey was seen and evaluated in the preoperative area and risks and benefits of procedure were again discussed in lay terms.  Informed consent was obtained.  Prophylactic antibiotics were given.  She was taken to the operating room, placed on the table in supine position.  General endotracheal tube anesthesia was obtained.  Her abdomen was prepped and draped in a standard surgical fashion.  Infraumbilical incision was made in the skin and dissection carried down to the umbilical stalk.  Umbilical stalk was elevated and sharply incised.  Peritoneum was entered bluntly and a 12-mm balloon port was placed and pneumoperitoneum was obtained.   Laparoscope was introduced.  There was no evidence of bowel injury upon entry.  An 11-mm epigastric port and two 5-mm right upper quadrant ports were placed under direct visualization and the gallbladder was retracted cephalad. She also had some adhesions from the liver to the abdominal wall consistent with Fitz-Hugh-Curtis syndrome.  The gallbladder was retracted cephalad and the omental adhesions were taken down bluntly. Then I skeletonized the cystic artery which was easily identified coursing up on the gallbladder.  We dissected around the artery above the lymph node of Calot and clips were placed on the artery and the artery was divided.  This opened up the triangle of Calot and I was able to skeletonize the cystic duct and visualized the single cystic duct coursing up on the gallbladder and liver parenchyma through the triangle of Calot.  Clips placed on the gallbladder side and a cystic ductotomy was made.  Cholangiogram catheter was passed through the abdominal wall through a 14-gauge angiocatheter and cholangiogram was performed after milking of some gallstones retrograde through the cystic duct back up into the gallbladder prior to clipping.  Cholangiogram demonstrated free flow of bile into the duodenum and normal right and left hepatic ducts without any filling defects in the common bile duct.  Catheter was removed and a clip was placed on the cystic duct stump and 0 PDS  Endoloop was also placed around the cystic duct stump.  Then the gallbladder was removed from the gallbladder fossa using Bovie electrocautery.  Gallbladder was entered during the dissection and she had a fairly intrahepatic gallbladder.  There was some spillage of bile and this was suctioned at the end of the case.  Gallbladder was completely removed from the gallbladder fossa and  placed in an EndoCatch bag and it was removed from the umbilical port site and passed off the table and sent to Pathology for  permanent sectioning.  She did have several small gallstones in the gallbladder.  The right upper quadrant was inspected for hemostasis which was noted to be adequate. Some clips were placed on the omentum which was taken down from the gallbladder.  Very small amount of oozing and this seemed to control the bleeding.  The right upper quadrant was irrigated with 2 L of sterile saline solution and irrigation returned clear.  Gallbladder fossa was noted to be hemostatic and clips were in good position.  The omentum was hemostatic.  I suctioned fluid from the pelvis and the remainder of the abdomen appeared hemostatic without any evidence of bowel injury.  The right upper quadrant trocars were removed under direct visualization until the abdominal wall was noted to be hemostatic.  The umbilical trocar was removed and the fascia was approximated with interrupted 0 Vicryl sutures in open fashion.  The gas was removed and the epigastric trocar was removed and the skin incisions were injected with 20 mL of 1% lidocaine with epinephrine and 0.25% Marcaine in a 50:50 mixture.  Skin edges were approximated with 4-0 Monocryl subcuticular suture.  Skin was washed and dried and Dermabond was applied.  All sponge, needle and instrument counts were correct at the end of the case.  The patient tolerated the procedure well without apparent complications.          ______________________________ [name], MD     BL/MEDQ  D:  08/28/2012  T:  08/28/2012  Job:  [id number]

## 2012-08-28 NOTE — Transfer of Care (Signed)
Immediate Anesthesia Transfer of Care Note  Patient: Katherine Lindsey  Procedure(s) Performed: Procedure(s) (LRB) with comments: LAPAROSCOPIC CHOLECYSTECTOMY WITH INTRAOPERATIVE CHOLANGIOGRAM (N/A)  Patient Location: PACU  Anesthesia Type: General  Level of Consciousness: sedated and patient cooperative  Airway & Oxygen Therapy: Patient Spontanous Breathing and Patient connected to face mask oxygen  Post-op Assessment: Report given to PACU RN, Post -op Vital signs reviewed and stable and Patient moving all extremities X 4  Post vital signs: Reviewed and stable  Complications: No apparent anesthesia complications

## 2012-08-28 NOTE — Anesthesia Preprocedure Evaluation (Addendum)
Anesthesia Evaluation  Patient identified by MRN, date of birth, ID band Patient awake    Reviewed: Allergy & Precautions, H&P , NPO status , Patient's Chart, lab work & pertinent test results  Airway Mallampati: II TM Distance: >3 FB Neck ROM: Full    Dental No notable dental hx. (+) Dental Advisory Given and Teeth Intact   Pulmonary neg pulmonary ROS,  breath sounds clear to auscultation  Pulmonary exam normal       Cardiovascular negative cardio ROS  Rhythm:Regular Rate:Normal     Neuro/Psych negative neurological ROS  negative psych ROS   GI/Hepatic negative GI ROS, Neg liver ROS,   Endo/Other  negative endocrine ROS  Renal/GU negative Renal ROS  negative genitourinary   Musculoskeletal negative musculoskeletal ROS (+)   Abdominal   Peds negative pediatric ROS (+)  Hematology negative hematology ROS (+)   Anesthesia Other Findings   Reproductive/Obstetrics negative OB ROS                          Anesthesia Physical Anesthesia Plan  ASA: II  Anesthesia Plan: General   Post-op Pain Management:    Induction: Intravenous  Airway Management Planned: Oral ETT  Additional Equipment:   Intra-op Plan:   Post-operative Plan: Extubation in OR  Informed Consent: I have reviewed the patients History and Physical, chart, labs and discussed the procedure including the risks, benefits and alternatives for the proposed anesthesia with the patient or authorized representative who has indicated his/her understanding and acceptance.   Dental advisory given  Plan Discussed with: CRNA  Anesthesia Plan Comments:         Anesthesia Quick Evaluation

## 2012-08-28 NOTE — Anesthesia Postprocedure Evaluation (Signed)
  Anesthesia Post-op Note  Patient: Katherine Lindsey  Procedure(s) Performed: Procedure(s) (LRB): LAPAROSCOPIC CHOLECYSTECTOMY WITH INTRAOPERATIVE CHOLANGIOGRAM (N/A)  Patient Location: PACU  Anesthesia Type: General  Level of Consciousness: awake and alert   Airway and Oxygen Therapy: Patient Spontanous Breathing  Post-op Pain: mild  Post-op Assessment: Post-op Vital signs reviewed, Patient's Cardiovascular Status Stable, Respiratory Function Stable, Patent Airway and No signs of Nausea or vomiting  Post-op Vital Signs: stable  Complications: No apparent anesthesia complications

## 2012-08-28 NOTE — Brief Op Note (Signed)
08/28/2012  9:37 AM  PATIENT:  Katherine Lindsey  26 y.o. female  PRE-OPERATIVE DIAGNOSIS:  GALLSTONES  POST-OPERATIVE DIAGNOSIS:  GALLSTONES  PROCEDURE:  Procedure(s) (LRB) with comments: LAPAROSCOPIC CHOLECYSTECTOMY WITH INTRAOPERATIVE CHOLANGIOGRAM (N/A)  SURGEON:  Surgeon(s) and Role:    * [name], DO - Primary  PHYSICIAN ASSISTANT:   ASSISTANTS: none   ANESTHESIA:   general  EBL:  Total I/O In: 2000 [I.V.:2000] Out: -   BLOOD ADMINISTERED:none  DRAINS: none   LOCAL MEDICATIONS USED:  MARCAINE    and LIDOCAINE   SPECIMEN:  Source of Specimen:  gallbladder  DISPOSITION OF SPECIMEN:  PATHOLOGY  COUNTS:  YES  TOURNIQUET:  * No tourniquets in log *  DICTATION: .Other Dictation: Dictation Number [id number]  PLAN OF CARE: Discharge to home after PACU  PATIENT DISPOSITION:  PACU - hemodynamically stable.   Delay start of Pharmacological VTE agent (>24hrs) due to surgical blood loss or risk of bleeding: no

## 2012-08-28 NOTE — Interval H&P Note (Signed)
History and Physical Interval Note:  08/28/2012 7:21 AM  Katherine Lindsey  has presented today for surgery, with the diagnosis of GALLSTONES  The various methods of treatment have been discussed with the patient and family. After consideration of risks, benefits and other options for treatment, the patient has consented to  Procedure(s) (LRB) with comments: LAPAROSCOPIC CHOLECYSTECTOMY WITH INTRAOPERATIVE CHOLANGIOGRAM (N/A) as a surgical intervention .  The patient's history has been reviewed, patient examined, no change in status, stable for surgery.  I have reviewed the patient's chart and labs.  Questions were answered to the patient's satisfaction.  She was seen and evaluated in the preop area.  She denies any changes from our last visit in the ER.  She has had one episode of discomfort since then but it was less severe than the others.  We again discussed the risks of the procedure.  The risks of infection, bleeding, pain, persistent symptoms, scarring, injury to bowel or bile ducts, retained stone, diarrhea, need for additional procedures, and need for open surgery discussed with the patient.  She desires to proceed with laparoscopic cholecystectomy, possible open, possible IOC.    [name] DAVID

## 2012-08-28 NOTE — Anesthesia Procedure Notes (Signed)
Procedure Name: Intubation Date/Time: 08/28/2012 7:40 AM Performed by: Sherie Don Pre-anesthesia Checklist: Patient identified, Emergency Drugs available, Suction available, Patient being monitored and Timeout performed Patient Re-evaluated:Patient Re-evaluated prior to inductionOxygen Delivery Method: Circle system utilized Preoxygenation: Pre-oxygenation with 100% oxygen Intubation Type: IV induction Ventilation: Mask ventilation without difficulty Laryngoscope Size: Mac and 3 Grade View: Grade I Tube type: Oral Tube size: 7.0 mm Number of attempts: 1 Airway Equipment and Method: Stylet Placement Confirmation: ETT inserted through vocal cords under direct vision,  positive ETCO2 and breath sounds checked- equal and bilateral Secured at: 23 cm Tube secured with: Tape Dental Injury: Teeth and Oropharynx as per pre-operative assessment

## 2012-09-01 ENCOUNTER — Encounter (HOSPITAL_COMMUNITY): Payer: Self-pay | Admitting: General Surgery

## 2012-09-05 ENCOUNTER — Ambulatory Visit (INDEPENDENT_AMBULATORY_CARE_PROVIDER_SITE_OTHER): Payer: Medicaid Other | Admitting: General Surgery

## 2012-09-12 ENCOUNTER — Ambulatory Visit (INDEPENDENT_AMBULATORY_CARE_PROVIDER_SITE_OTHER): Payer: Medicaid Other | Admitting: General Surgery

## 2012-09-12 ENCOUNTER — Encounter (INDEPENDENT_AMBULATORY_CARE_PROVIDER_SITE_OTHER): Payer: Self-pay | Admitting: General Surgery

## 2012-09-12 VITALS — BP 126/70 | HR 59 | Temp 98.6°F | Resp 18 | Ht 68.0 in | Wt 141.0 lb

## 2012-09-12 DIAGNOSIS — Z4889 Encounter for other specified surgical aftercare: Secondary | ICD-10-CM

## 2012-09-12 DIAGNOSIS — Z5189 Encounter for other specified aftercare: Secondary | ICD-10-CM

## 2012-09-12 NOTE — Progress Notes (Signed)
Subjective:     Patient ID: Katherine Lindsey, female   DOB: 07/16/1986, 26 y.o.   MRN: [id number]  HPI This patient follows up status post microscopic cholecystectomy for symptomatic cholelithiasis. She has not had any episodes of the severe pain that she had preoperatively but she still has some persistent back pain which is similar to pain that she had preoperatively. Her pathology was benign. She has not had any food intolerances in has not had any diarrhea in fact she says that she has been more constipated than loose. She denies any fevers or chills  Review of Systems     Objective:   Physical Exam No distress and nontoxic-appearing sitting comfortably in the bed Her incisions are healing well without sign of infection. Her abdomen is soft and nontender on exam it is nondistended    Assessment:     Status post laparoscopic cholecystectomy She seems to be doing well without any evidence of any postoperative complications. I am uncertain as to the cause of her back pain but she says that this is similar to preoperatively. She may have a separate issue other than the gallbladder. I recommended that she followup with her regular physician for evaluation of the back pain. Her bowels should regulate out to normal.    Plan:     She seems to be doing fairly well and appropriate for this stage postoperatively. I recommended that she follow up with me in a few weeks if her symptoms persist otherwise she can follow up on a when necessary basis

## 2012-10-08 ENCOUNTER — Ambulatory Visit (INDEPENDENT_AMBULATORY_CARE_PROVIDER_SITE_OTHER): Payer: Medicaid Other | Admitting: Obstetrics & Gynecology

## 2012-10-08 ENCOUNTER — Encounter: Payer: Self-pay | Admitting: Advanced Practice Midwife

## 2012-10-08 VITALS — BP 128/75 | HR 73 | Temp 97.1°F | Ht 67.5 in | Wt 138.1 lb

## 2012-10-08 DIAGNOSIS — Z3201 Encounter for pregnancy test, result positive: Secondary | ICD-10-CM

## 2012-10-08 MED ORDER — PRENATAL VITAMINS 0.8 MG PO TABS: 1.0000 | ORAL_TABLET | Freq: Every day | ORAL | Status: DC

## 2012-10-08 NOTE — Progress Notes (Signed)
  Subjective:    Patient ID: Katherine Lindsey, female    DOB: 09-19-1986, 26 y.o.   MRN: [id number]  HPIPatient's last menstrual period was 09/12/2012. [id number] Here for annual. Last took OCP in Sept. Pos pregnancy test in clinic now.    Review of Systems No pain, bleeding, nausea.    Objective:   Physical Exam  Constitutional: She appears well-developed and well-nourished.  Psychiatric: She has a normal mood and affect. Her behavior is normal.   Filed Vitals:   10/08/12 1600  BP: 128/75  Pulse: 73  Temp: 97.1 F (36.2 C)          Assessment & Plan:  Early pregnancy. Discussed options for [facility] including [facility]. She saw Dr. Gaynell Face last pregnancy  ARNOLD,JAMES 10/08/2012 4:21 PM

## 2012-10-08 NOTE — Progress Notes (Signed)
Here for yearly exam/pap. C/o intermittent sharp pains in abdomen/pelvis for about a month. C/o leg cramps at times. Would like to restart depoprovera

## 2012-10-17 ENCOUNTER — Inpatient Hospital Stay (HOSPITAL_COMMUNITY)
Admission: AD | Admit: 2012-10-17 | Discharge: 2012-10-17 | Disposition: A | Discharge status: Home / Self Care | Payer: Medicaid Other | Source: Ambulatory Visit | Attending: Obstetrics & Gynecology | Admitting: Obstetrics & Gynecology

## 2012-10-17 ENCOUNTER — Inpatient Hospital Stay (HOSPITAL_COMMUNITY): Payer: Medicaid Other

## 2012-10-17 ENCOUNTER — Encounter (HOSPITAL_COMMUNITY): Payer: Self-pay | Admitting: *Deleted

## 2012-10-17 DIAGNOSIS — N939 Abnormal uterine and vaginal bleeding, unspecified: Secondary | ICD-10-CM | POA: Diagnosis present

## 2012-10-17 DIAGNOSIS — Z3490 Encounter for supervision of normal pregnancy, unspecified, unspecified trimester: Secondary | ICD-10-CM

## 2012-10-17 DIAGNOSIS — O26859 Spotting complicating pregnancy, unspecified trimester: Secondary | ICD-10-CM | POA: Insufficient documentation

## 2012-10-17 HISTORY — DX: Abnormal uterine and vaginal bleeding, unspecified: N93.9

## 2012-10-17 LAB — CBC
HCT: 36.2 % (ref 36.0–46.0)
Hemoglobin: 12.4 g/dL (ref 12.0–15.0)
MCV: 87.2 fL (ref 78.0–100.0)
RBC: 4.15 MIL/uL (ref 3.87–5.11)
RDW: 13.5 % (ref 11.5–15.5)
WBC: 7.2 10*3/uL (ref 4.0–10.5)

## 2012-10-17 NOTE — MAU Note (Signed)
Pt states had +upt at clinic 10/08/12, spotting began last night, has intermittent back pain. Has been feeling dizzy and light headed at times. Intermittent nausea.

## 2012-10-17 NOTE — MAU Note (Signed)
Pt had labs, u/s, and seen by CNM and d/c home.

## 2012-10-17 NOTE — MAU Provider Note (Signed)
  History     CSN: [id number]  Arrival date and time: 10/17/12 1519   None     Chief Complaint  Patient presents with  . Vaginal Bleeding  . Back Pain   HPI  Pt is [redacted] weeks pregnant [id number] with LMP 09/12/2012.  She was seen in the OP clinic on 10/08/12 for AEXwith positive pregnancy test  Past Medical History  Diagnosis Date  . Gallstones     Past Surgical History  Procedure Date  . Induced abortion 02/2012  . Cholecystectomy 08/28/2012    Procedure: LAPAROSCOPIC CHOLECYSTECTOMY WITH INTRAOPERATIVE CHOLANGIOGRAM;  Surgeon: [name], DO;  Location: [facility] OR;  Service: General;  Laterality: N/A;    No family history on file.  History  Substance Use Topics  . Smoking status: Never Smoker   . Smokeless tobacco: Never Used  . Alcohol Use: Yes     report 1 drink every two weeks or so    Allergies: No Known Allergies  Prescriptions prior to admission  Medication Sig Dispense Refill  . oxyCODONE-acetaminophen (PERCOCET) 5-325 MG per tablet Take 1 tablet by mouth every 4 (four) hours as needed.      . Prenatal Multivit-Min-Fe-FA (PRENATAL VITAMINS) 0.8 MG tablet Take 1 tablet by mouth daily.  30 tablet  12    ROS Physical Exam   Blood pressure 127/66, pulse 72, temperature 97.8 F (36.6 C), temperature source Oral, resp. rate 16, height 5' 7.5" (1.715 m), last menstrual period 09/12/2012.  Physical Exam  MAU Course  Procedures   Assessment and Plan    LINEBERRY,SUSAN 10/17/2012, 4:52 PM   O: BP 127/66  Pulse 72  Temp 97.8 F (36.6 C) (Oral)  Resp 16  Ht 5' 7.5" (1.715 m)  LMP 09/12/2012  General: pt is alert, oriented, and in no apparent distress  Pelvic exam deferred--pt needs to leave MAU before exam completed.  Results for orders placed during the hospital encounter of 10/17/12 (from the past 24 hour(s))  CBC     Status: Normal   Collection Time   10/17/12  3:42 PM      Component Value Range   WBC 7.2  4.0 - 10.5 K/uL   RBC 4.15  3.87 - 5.11  MIL/uL   Hemoglobin 12.4  12.0 - 15.0 g/dL   HCT 14.7  82.9 - 56.2 %   MCV 87.2  78.0 - 100.0 fL   MCH 29.9  26.0 - 34.0 pg   MCHC 34.3  30.0 - 36.0 g/dL   RDW 13.0  86.5 - 78.4 %   Platelets 273  150 - 400 K/uL  HCG, QUANTITATIVE, PREGNANCY     Status: Abnormal   Collection Time   10/17/12  3:42 PM      Component Value Range   hCG, Beta Chain, Quant, S 2485 (*) <5 mIU/mL   A: IUP [redacted]w[redacted]d by LMP, confirmed by U/S Vaginal spotting  P: D/C home Drink plenty of fluids Pregnancy verification letter given List of providers given and pt encouraged to begin early prenatal care Bleeding precautions given Return to MAU as needed  Sharen Counter Certified Nurse-Midwife

## 2012-10-27 ENCOUNTER — Telehealth (INDEPENDENT_AMBULATORY_CARE_PROVIDER_SITE_OTHER): Payer: Self-pay

## 2012-10-27 NOTE — Telephone Encounter (Signed)
Operative report faxed to Dr. Bruna Potter @ [phone]

## 2012-10-27 NOTE — Telephone Encounter (Signed)
Message copied by Maryan Puls on Mon Oct 27, 2012  5:23 PM ------      Message from: Marin Shutter      Created: Thu Oct 23, 2012  1:49 PM      Regarding: Dr. Biagio Quint       Martha/Dr. Blount's office called and would like the sx notes 9/12. Fax # [phone]. Thx

## 2012-11-26 ENCOUNTER — Encounter (HOSPITAL_COMMUNITY): Payer: Self-pay

## 2012-11-26 ENCOUNTER — Inpatient Hospital Stay (HOSPITAL_COMMUNITY)
Admission: AD | Admit: 2012-11-26 | Discharge: 2012-11-26 | Disposition: A | Discharge status: Home / Self Care | Payer: Medicaid Other | Source: Ambulatory Visit | Attending: Obstetrics & Gynecology | Admitting: Obstetrics & Gynecology

## 2012-11-26 DIAGNOSIS — O26899 Other specified pregnancy related conditions, unspecified trimester: Secondary | ICD-10-CM

## 2012-11-26 DIAGNOSIS — O99891 Other specified diseases and conditions complicating pregnancy: Secondary | ICD-10-CM | POA: Insufficient documentation

## 2012-11-26 DIAGNOSIS — R109 Unspecified abdominal pain: Secondary | ICD-10-CM | POA: Insufficient documentation

## 2012-11-26 LAB — URINALYSIS, ROUTINE W REFLEX MICROSCOPIC
Ketones, ur: NEGATIVE mg/dL
Leukocytes, UA: NEGATIVE
Nitrite: NEGATIVE
Protein, ur: NEGATIVE mg/dL
Urobilinogen, UA: 0.2 mg/dL (ref 0.0–1.0)

## 2012-11-26 MED ORDER — ACETAMINOPHEN 325 MG PO TABS
650.0000 mg | ORAL_TABLET | Freq: Once | ORAL | Status: AC
  Administered 2012-11-26: 650 mg via ORAL
  Filled 2012-11-26: qty 2

## 2012-11-26 NOTE — MAU Note (Signed)
Patient states she started having left sided pain last night that is worse today. Denies any bleeding or discharge. Has had nausea but no vomiting.

## 2012-11-26 NOTE — MAU Provider Note (Signed)
Attestation of Attending Supervision of Advanced Practitioner (PA/CNM/NP): Evaluation and management procedures were performed by the Advanced Practitioner under my supervision and collaboration.  I have reviewed the Advanced Practitioner's note and chart, and I agree with the management and plan.  Latiesha Harada, MD, FACOG Attending Obstetrician & Gynecologist Faculty Practice, [facility]  

## 2012-11-26 NOTE — MAU Provider Note (Signed)
History     CSN: [id number]  Arrival date and time: 11/26/12 1031   First Provider Initiated Contact with Patient 11/26/12 1144      Chief Complaint  Patient presents with  . Abdominal Pain   HPI Comments: Katherine Lindsey is a 26 y.o. [phone] female with an estimated gestational age of [redacted]w[redacted]d by LMP of 09/12/12 who is here today with a one day history of left sided abdominal pain. The pain started last night and is worse this morning, she describes it as a dull constant ache with intermittent worsening and suprapubic cramping. She also reports nausea.. No urinary complaints, had a regular BM this morning. Has been able to eat some, but says she has less of an appetite. She denies any fever, chills, dysuria, or  vaginal discharge or bleeding. Has not been with a new partner recently.  No complications with her previous pregnancy. She is scheduled for her first prenatal appt. in the [facility] clinic on 12/17.      Past Medical History  Diagnosis Date  . Gallstones     Past Surgical History  Procedure Date  . Induced abortion 02/2012  . Cholecystectomy 08/28/2012    Procedure: LAPAROSCOPIC CHOLECYSTECTOMY WITH INTRAOPERATIVE CHOLANGIOGRAM;  Surgeon: [name], DO;  Location: [facility] OR;  Service: General;  Laterality: N/A;  . Dilation and curettage of uterus     Family History  Problem Relation Age of Onset  . Other Neg Hx     History  Substance Use Topics  . Smoking status: Never Smoker   . Smokeless tobacco: Never Used  . Alcohol Use: No    Allergies: No Known Allergies  Prescriptions prior to admission  Medication Sig Dispense Refill  . ibuprofen (ADVIL,MOTRIN) 200 MG tablet Take 400 mg by mouth every 6 (six) hours as needed. Takes for pain      . Prenatal Multivit-Min-Fe-FA (PRENATAL VITAMINS) 0.8 MG tablet Take 1 tablet by mouth daily.  30 tablet  12    Review of Systems  Constitutional: Negative for fever and chills.  Gastrointestinal: Positive for  nausea and abdominal pain (left sided, with intermittent suprapubic cramping). Negative for heartburn, vomiting, diarrhea and constipation.  Genitourinary: Negative for dysuria, urgency, frequency and hematuria.  Neurological: Positive for dizziness (on and off). Negative for headaches.   Physical Exam   Blood pressure 128/64, pulse 81, temperature 98.2 F (36.8 C), temperature source Oral, resp. rate 16, height 5\' 7"  (1.702 m), weight 62.234 kg (137 lb 3.2 oz), last menstrual period 09/12/2012, SpO2 100.00%.  Physical Exam  Nursing note and vitals reviewed. Constitutional: She is oriented to person, place, and time. She appears well-developed and well-nourished. No distress.  GI: Soft. She exhibits no distension and no mass. There is tenderness (very mild left sided tenderness). There is no guarding.  Genitourinary:       Cervical check revealed a long, thick, closed cervix.  Neurological: She is alert and oriented to person, place, and time.  Skin: Skin is warm and dry.  Psychiatric: She has a normal mood and affect. Her behavior is normal.     Results for orders placed during the hospital encounter of 11/26/12 (from the past 24 hour(s))  URINALYSIS, ROUTINE W REFLEX MICROSCOPIC     Status: Normal   Collection Time   11/26/12 11:00 AM      Component Value Range   Color, Urine YELLOW  YELLOW   APPearance CLEAR  CLEAR   Specific Gravity, Urine 1.025  1.005 -  1.030   pH 6.0  5.0 - 8.0   Glucose, UA NEGATIVE  NEGATIVE mg/dL   Hgb urine dipstick NEGATIVE  NEGATIVE   Bilirubin Urine NEGATIVE  NEGATIVE   Ketones, ur NEGATIVE  NEGATIVE mg/dL   Protein, ur NEGATIVE  NEGATIVE mg/dL   Urobilinogen, UA 0.2  0.0 - 1.0 mg/dL   Nitrite NEGATIVE  NEGATIVE   Leukocytes, UA NEGATIVE  NEGATIVE    MAU Course  Procedures Fetal heartbeat checked Cervical check performed with Nolene Bernheim, NP   Assessment and Plan  25 y.o. [phone] with estimated gestational age of [redacted]w[redacted]d with abdominal  pain  Plan:    Tylenol 650 mg given in MAU  Encouraged patient to drink plenty of fluids as she may be dehydrated  Encouraged patient to take Tylenol, not Ibuprofen for pain  Patient will be discharged home today, first prenatal appt. In clinic on the 17th  I have supervised the student and agree with the exam and documentation.  Nolene Bernheim, RN, FNP  [name] 11/26/2012, 12:23 PM

## 2012-11-26 NOTE — MAU Note (Signed)
Pt states left sided lower abdominal pain since last pm, denies abnormal vaginal discharge or bleeding.

## 2012-12-02 ENCOUNTER — Encounter: Payer: Medicaid Other | Admitting: Obstetrics & Gynecology

## 2012-12-22 ENCOUNTER — Encounter: Payer: Self-pay | Admitting: Obstetrics and Gynecology

## 2012-12-22 ENCOUNTER — Encounter: Payer: Self-pay | Admitting: *Deleted

## 2012-12-22 ENCOUNTER — Other Ambulatory Visit (HOSPITAL_COMMUNITY)
Admission: RE | Admit: 2012-12-22 | Discharge: 2012-12-22 | Disposition: A | Discharge status: Home / Self Care | Payer: Medicaid Other | Source: Ambulatory Visit | Attending: Obstetrics and Gynecology | Admitting: Obstetrics and Gynecology

## 2012-12-22 ENCOUNTER — Ambulatory Visit (INDEPENDENT_AMBULATORY_CARE_PROVIDER_SITE_OTHER): Payer: Medicaid Other | Admitting: Obstetrics and Gynecology

## 2012-12-22 VITALS — BP 126/73 | Wt 135.6 lb

## 2012-12-22 DIAGNOSIS — Z01419 Encounter for gynecological examination (general) (routine) without abnormal findings: Secondary | ICD-10-CM | POA: Insufficient documentation

## 2012-12-22 DIAGNOSIS — Z3490 Encounter for supervision of normal pregnancy, unspecified, unspecified trimester: Secondary | ICD-10-CM

## 2012-12-22 DIAGNOSIS — Z3480 Encounter for supervision of other normal pregnancy, unspecified trimester: Secondary | ICD-10-CM

## 2012-12-22 DIAGNOSIS — Z113 Encounter for screening for infections with a predominantly sexual mode of transmission: Secondary | ICD-10-CM | POA: Insufficient documentation

## 2012-12-22 LAB — POCT URINALYSIS DIP (DEVICE)
Bilirubin Urine: NEGATIVE
Glucose, UA: NEGATIVE mg/dL
Hgb urine dipstick: NEGATIVE
Specific Gravity, Urine: 1.025 (ref 1.005–1.030)
Urobilinogen, UA: 0.2 mg/dL (ref 0.0–1.0)
pH: 6 (ref 5.0–8.0)

## 2012-12-22 NOTE — Progress Notes (Signed)
   Subjective:    Katherine Lindsey is a [id number] [redacted]w[redacted]d being seen today for her first obstetrical visit.  Her obstetrical history is significant for previous term pregnancy without complications. Patient does not intend to breast feed. Pregnancy history fully reviewed.  Patient reports no complaints.  Filed Vitals:   12/22/12 1015  BP: 126/73  Weight: 135 lb 9.6 oz (61.508 kg)    HISTORY: OB History    Grav Para Term Preterm Abortions TAB SAB Ect Mult Living   4 1 1  2 1 1   1      # Outc Date GA Lbr Len/2nd Wgt Sex Del Anes PTL Lv   1 TRM 10/11 [redacted]w[redacted]d   F SVD   Yes   2 TAB            3 SAB            4 CUR              Past Medical History  Diagnosis Date  . Gallstones    Past Surgical History  Procedure Date  . Induced abortion 02/2012  . Cholecystectomy 08/28/2012    Procedure: LAPAROSCOPIC CHOLECYSTECTOMY WITH INTRAOPERATIVE CHOLANGIOGRAM;  Surgeon: [name], DO;  Location: [facility] OR;  Service: General;  Laterality: N/A;   Family History  Problem Relation Age of Onset  . Other Neg Hx      Exam    Uterus:     Pelvic Exam:    Perineum: No Hemorrhoids, Normal Perineum   Vulva: normal   Vagina:  normal mucosa, normal discharge   pH:    Cervix: closed and long   Adnexa: no mass, fullness, tenderness   Bony Pelvis: android  System: Breast:  normal appearance, no masses or tenderness   Skin: normal coloration and turgor, no rashes    Neurologic: oriented, grossly non-focal   Extremities: normal strength, tone, and muscle mass   HEENT extra ocular movement intact   Mouth/Teeth mucous membranes moist, pharynx normal without lesions   Neck supple and no masses   Cardiovascular: regular rate and rhythm   Respiratory:  chest clear, no wheezing, crepitations, rhonchi, normal symmetric air entry   Abdomen: soft, non-tender; bowel sounds normal; no masses,  no organomegaly   Urinary:       Assessment:    Pregnancy: [id number] Patient Active Problem List    Diagnosis  . Positive pregnancy test  . Normal IUP (intrauterine pregnancy) on prenatal ultrasound  . Vaginal spotting        Plan:     Initial labs drawn. Prenatal vitamins. Problem list reviewed and updated. Genetic Screening discussed Quad Screen: requested.  Ultrasound discussed; fetal survey: requested.  Follow up in 4 weeks. 50% of 30 min visit spent on counseling and coordination of care.     Anikin Prosser 12/22/2012

## 2012-12-22 NOTE — Progress Notes (Signed)
Pulse: 78 Having trouble with her PNV, makes her nauseated. Discussed recommended weight gain 25-35lbs.

## 2012-12-22 NOTE — Progress Notes (Addendum)
Nutrition Note: 1st visit consult Pt has gained 0.6# @ [redacted]w[redacted]d, which is < expected. Pt reports eating only 2x/d due to decreased appetite. Pt reports drinking water & milk daily. Pt is not taking PNV due to N&V when taking PNV but reports no N&V at other times. Pt received written & verbal education on general nutrition during pregnancy. Disc tips to increase appetite & encouraged pt to consume protein with all meals & snacks. Encouraged 2 chewable multivitamins/d.  Disc importance of BF. Disc wt gain goals of 25-35# or 1#/wk.  Pt agrees to take equivalent of PNV & try to eat more often.  Pt does not receive WIC but plans to apply. Pt is unsure about BF. F/u in 4-6 wks Blondell Reveal, MS, RD, LDN

## 2012-12-23 LAB — OBSTETRIC PANEL
Antibody Screen: NEGATIVE
Basophils Absolute: 0 10*3/uL (ref 0.0–0.1)
Basophils Relative: 0 % (ref 0–1)
HCT: 34.8 % — ABNORMAL LOW (ref 36.0–46.0)
MCHC: 34.8 g/dL (ref 30.0–36.0)
Monocytes Absolute: 0.4 10*3/uL (ref 0.1–1.0)
Neutro Abs: 3.9 10*3/uL (ref 1.7–7.7)
Neutrophils Relative %: 63 % (ref 43–77)
Platelets: 263 10*3/uL (ref 150–400)
RDW: 13.3 % (ref 11.5–15.5)
WBC: 6.1 10*3/uL (ref 4.0–10.5)

## 2012-12-24 LAB — CULTURE, OB URINE: Colony Count: 45000

## 2012-12-24 LAB — HEMOGLOBINOPATHY EVALUATION
Hgb A2 Quant: 2.7 % (ref 2.2–3.2)
Hgb F Quant: 0 % (ref 0.0–2.0)
Hgb S Quant: 0 %

## 2013-01-21 ENCOUNTER — Encounter: Payer: Medicaid Other | Admitting: Family Medicine

## 2013-01-28 ENCOUNTER — Encounter: Payer: Medicaid Other | Admitting: Family Medicine

## 2013-02-04 ENCOUNTER — Ambulatory Visit (INDEPENDENT_AMBULATORY_CARE_PROVIDER_SITE_OTHER): Payer: Medicaid Other | Admitting: Family Medicine

## 2013-02-04 VITALS — BP 113/68 | Temp 97.8°F | Wt 142.0 lb

## 2013-02-04 DIAGNOSIS — N898 Other specified noninflammatory disorders of vagina: Secondary | ICD-10-CM

## 2013-02-04 DIAGNOSIS — Z3400 Encounter for supervision of normal first pregnancy, unspecified trimester: Secondary | ICD-10-CM

## 2013-02-04 DIAGNOSIS — N939 Abnormal uterine and vaginal bleeding, unspecified: Secondary | ICD-10-CM

## 2013-02-04 DIAGNOSIS — Z3402 Encounter for supervision of normal first pregnancy, second trimester: Secondary | ICD-10-CM

## 2013-02-04 LAB — POCT URINALYSIS DIP (DEVICE)
Bilirubin Urine: NEGATIVE
Hgb urine dipstick: NEGATIVE
Ketones, ur: NEGATIVE mg/dL
pH: 6.5 (ref 5.0–8.0)

## 2013-02-04 NOTE — Patient Instructions (Addendum)
Pregnancy - Second Trimester The second trimester of pregnancy (3 to 6 months) is a period of rapid growth for you and your baby. At the end of the sixth month, your baby is about 9 inches long and weighs 1 1/2 pounds. You will begin to feel the baby move between 18 and 20 weeks of the pregnancy. This is called quickening. Weight gain is faster. A clear fluid (colostrum) may leak out of your breasts. You may feel small contractions of the womb (uterus). This is known as false labor or Braxton-Hicks contractions. This is like a practice for labor when the baby is ready to be born. Usually, the problems with morning sickness have usually passed by the end of your first trimester. Some women develop small dark blotches (called cholasma, mask of pregnancy) on their face that usually goes away after the baby is born. Exposure to the sun makes the blotches worse. Acne may also develop in some pregnant women and pregnant women who have acne, may find that it goes away. PRENATAL EXAMS  Blood work may continue to be done during prenatal exams. These tests are done to check on your health and the probable health of your baby. Blood work is used to follow your blood levels (hemoglobin). Anemia (low hemoglobin) is common during pregnancy. Iron and vitamins are given to help prevent this. You will also be checked for diabetes between 24 and 28 weeks of the pregnancy. Some of the previous blood tests may be repeated.  The size of the uterus is measured during each visit. This is to make sure that the baby is continuing to grow properly according to the dates of the pregnancy.  Your blood pressure is checked every prenatal visit. This is to make sure you are not getting toxemia.  Your urine is checked to make sure you do not have an infection, diabetes or protein in the urine.  Your weight is checked often to make sure gains are happening at the suggested rate. This is to ensure that both you and your baby are  growing normally.  Sometimes, an ultrasound is performed to confirm the proper growth and development of the baby. This is a test which bounces harmless sound waves off the baby so your caregiver can more accurately determine due dates. Sometimes, a specialized test is done on the amniotic fluid surrounding the baby. This test is called an amniocentesis. The amniotic fluid is obtained by sticking a needle into the belly (abdomen). This is done to check the chromosomes in instances where there is a concern about possible genetic problems with the baby. It is also sometimes done near the end of pregnancy if an early delivery is required. In this case, it is done to help make sure the baby's lungs are mature enough for the baby to live outside of the womb. CHANGES OCCURING IN THE SECOND TRIMESTER OF PREGNANCY Your body goes through many changes during pregnancy. They vary from person to person. Talk to your caregiver about changes you notice that you are concerned about.  During the second trimester, you will likely have an increase in your appetite. It is normal to have cravings for certain foods. This varies from person to person and pregnancy to pregnancy.  Your lower abdomen will begin to bulge.  You may have to urinate more often because the uterus and baby are pressing on your bladder. It is also common to get more bladder infections during pregnancy (pain with urination). You can help this by   drinking lots of fluids and emptying your bladder before and after intercourse.  You may begin to get stretch marks on your hips, abdomen, and breasts. These are normal changes in the body during pregnancy. There are no exercises or medications to take that prevent this change.  You may begin to develop swollen and bulging veins (varicose veins) in your legs. Wearing support hose, elevating your feet for 15 minutes, 3 to 4 times a day and limiting salt in your diet helps lessen the problem.  Heartburn may  develop as the uterus grows and pushes up against the stomach. Antacids recommended by your caregiver helps with this problem. Also, eating smaller meals 4 to 5 times a day helps.  Constipation can be treated with a stool softener or adding bulk to your diet. Drinking lots of fluids, vegetables, fruits, and whole grains are helpful.  Exercising is also helpful. If you have been very active up until your pregnancy, most of these activities can be continued during your pregnancy. If you have been less active, it is helpful to start an exercise program such as walking.  Hemorrhoids (varicose veins in the rectum) may develop at the end of the second trimester. Warm sitz baths and hemorrhoid cream recommended by your caregiver helps hemorrhoid problems.  Backaches may develop during this time of your pregnancy. Avoid heavy lifting, wear low heal shoes and practice good posture to help with backache problems.  Some pregnant women develop tingling and numbness of their hand and fingers because of swelling and tightening of ligaments in the wrist (carpel tunnel syndrome). This goes away after the baby is born.  As your breasts enlarge, you may have to get a bigger bra. Get a comfortable, cotton, support bra. Do not get a nursing bra until the last month of the pregnancy if you will be nursing the baby.  You may get a dark line from your belly button to the pubic area called the linea nigra.  You may develop rosy cheeks because of increase blood flow to the face.  You may develop spider looking lines of the face, neck, arms and chest. These go away after the baby is born. HOME CARE INSTRUCTIONS   It is extremely important to avoid all smoking, herbs, alcohol, and unprescribed drugs during your pregnancy. These chemicals affect the formation and growth of the baby. Avoid these chemicals throughout the pregnancy to ensure the delivery of a healthy infant.  Most of your home care instructions are the same  as suggested for the first trimester of your pregnancy. Keep your caregiver's appointments. Follow your caregiver's instructions regarding medication use, exercise and diet.  During pregnancy, you are providing food for you and your baby. Continue to eat regular, well-balanced meals. Choose foods such as meat, fish, milk and other low fat dairy products, vegetables, fruits, and whole-grain breads and cereals. Your caregiver will tell you of the ideal weight gain.  A physical sexual relationship may be continued up until near the end of pregnancy if there are no other problems. Problems could include early (premature) leaking of amniotic fluid from the membranes, vaginal bleeding, abdominal pain, or other medical or pregnancy problems.  Exercise regularly if there are no restrictions. Check with your caregiver if you are unsure of the safety of some of your exercises. The greatest weight gain will occur in the last 2 trimesters of pregnancy. Exercise will help you:  Control your weight.  Get you in shape for labor and delivery.  Lose weight   after you have the baby.  Wear a good support or jogging bra for breast tenderness during pregnancy. This may help if worn during sleep. Pads or tissues may be used in the bra if you are leaking colostrum.  Do not use hot tubs, steam rooms or saunas throughout the pregnancy.  Wear your seat belt at all times when driving. This protects you and your baby if you are in an accident.  Avoid raw meat, uncooked cheese, cat litter boxes and soil used by cats. These carry germs that can cause birth defects in the baby.  The second trimester is also a good time to visit your dentist for your dental health if this has not been done yet. Getting your teeth cleaned is OK. Use a soft toothbrush. Brush gently during pregnancy.  It is easier to loose urine during pregnancy. Tightening up and strengthening the pelvic muscles will help with this problem. Practice stopping  your urination while you are going to the bathroom. These are the same muscles you need to strengthen. It is also the muscles you would use as if you were trying to stop from passing gas. You can practice tightening these muscles up 10 times a set and repeating this about 3 times per day. Once you know what muscles to tighten up, do not perform these exercises during urination. It is more likely to contribute to an infection by backing up the urine.  Ask for help if you have financial, counseling or nutritional needs during pregnancy. Your caregiver will be able to offer counseling for these needs as well as refer you for other special needs.  Your skin may become oily. If so, wash your face with mild soap, use non-greasy moisturizer and oil or cream based makeup. MEDICATIONS AND DRUG USE IN PREGNANCY  Take prenatal vitamins as directed. The vitamin should contain 1 milligram of folic acid. Keep all vitamins out of reach of children. Only a couple vitamins or tablets containing iron may be fatal to a baby or young child when ingested.  Avoid use of all medications, including herbs, over-the-counter medications, not prescribed or suggested by your caregiver. Only take over-the-counter or prescription medicines for pain, discomfort, or fever as directed by your caregiver. Do not use aspirin.  Let your caregiver also know about herbs you may be using.  Alcohol is related to a number of birth defects. This includes fetal alcohol syndrome. All alcohol, in any form, should be avoided completely. Smoking will cause low birth rate and premature babies.  Street or illegal drugs are very harmful to the baby. They are absolutely forbidden. A baby born to an addicted mother will be addicted at birth. The baby will go through the same withdrawal an adult does. SEEK MEDICAL CARE IF:  You have any concerns or worries during your pregnancy. It is better to call with your questions if you feel they cannot wait,  rather than worry about them. SEEK IMMEDIATE MEDICAL CARE IF:   An unexplained oral temperature above 102 F (38.9 C) develops, or as your caregiver suggests.  You have leaking of fluid from the vagina (birth canal). If leaking membranes are suspected, take your temperature and tell your caregiver of this when you call.  There is vaginal spotting, bleeding, or passing clots. Tell your caregiver of the amount and how many pads are used. Light spotting in pregnancy is common, especially following intercourse.  You develop a bad smelling vaginal discharge with a change in the color from clear   to white.  You continue to feel sick to your stomach (nauseated) and have no relief from remedies suggested. You vomit blood or coffee ground-like materials.  You lose more than 2 pounds of weight or gain more than 2 pounds of weight over 1 week, or as suggested by your caregiver.  You notice swelling of your face, hands, feet, or legs.  You get exposed to German measles and have never had them.  You are exposed to fifth disease or chickenpox.  You develop belly (abdominal) pain. Round ligament discomfort is a common non-cancerous (benign) cause of abdominal pain in pregnancy. Your caregiver still must evaluate you.  You develop a bad headache that does not go away.  You develop fever, diarrhea, pain with urination, or shortness of breath.  You develop visual problems, blurry, or double vision.  You fall or are in a car accident or any kind of trauma.  There is mental or physical violence at home. Document Released: 11/27/2001 Document Revised: 02/25/2012 Document Reviewed: 06/01/2009 [employer] Patient Information 2013 [employer].  Breastfeeding Deciding to breastfeed is one of the best choices you can make for you and your baby. The information that follows gives a brief overview of the benefits of breastfeeding as well as common topics surrounding breastfeeding. BENEFITS OF  BREASTFEEDING For the baby  The first milk (colostrum) helps the baby's digestive system function better.   There are antibodies in the mother's milk that help the baby fight off infections.   The baby has a lower incidence of asthma, allergies, and sudden infant death syndrome (SIDS).   The nutrients in breast milk are better for the baby than infant formulas, and breast milk helps the baby's brain grow better.   Babies who breastfeed have less gas, colic, and constipation.  For the mother  Breastfeeding helps develop a very special bond between the mother and her baby.   Breastfeeding is convenient, always available at the correct temperature, and costs nothing.   Breastfeeding burns calories in the mother and helps her lose weight that was gained during pregnancy.   Breastfeeding makes the uterus contract back down to normal size faster and slows bleeding following delivery.   Breastfeeding mothers have a lower risk of developing breast cancer.  BREASTFEEDING FREQUENCY  A healthy, full-term baby may breastfeed as often as every hour or space his or her feedings to every 3 hours.   Watch your baby for signs of hunger. Nurse your baby if he or she shows signs of hunger. How often you nurse will vary from baby to baby.   Nurse as often as the baby requests, or when you feel the need to reduce the fullness of your breasts.   Awaken the baby if it has been 3 4 hours since the last feeding.   Frequent feeding will help the mother make more milk and will help prevent problems, such as sore nipples and engorgement of the breasts.  BABY'S POSITION AT THE BREAST  Whether lying down or sitting, be sure that the baby's tummy is facing your tummy.   Support the breast with 4 fingers underneath the breast and the thumb above. Make sure your fingers are well away from the nipple and baby's mouth.   Stroke the baby's lips gently with your finger or nipple.   When the  baby's mouth is open wide enough, place all of your nipple and as much of the areola as possible into your baby's mouth.   Pull the baby in   close so the tip of the nose and the baby's cheeks touch the breast during the feeding.  FEEDINGS AND SUCTION  The length of each feeding varies from baby to baby and from feeding to feeding.   The baby must suck about 2 3 minutes for your milk to get to him or her. This is called a "let down." For this reason, allow the baby to feed on each breast as long as he or she wants. Your baby will end the feeding when he or she has received the right balance of nutrients.   To break the suction, put your finger into the corner of the baby's mouth and slide it between his or her gums before removing your breast from his or her mouth. This will help prevent sore nipples.  HOW TO TELL WHETHER YOUR BABY IS GETTING ENOUGH BREAST MILK. Wondering whether or not your baby is getting enough milk is a common concern among mothers. You can be assured that your baby is getting enough milk if:   Your baby is actively sucking and you hear swallowing.   Your baby seems relaxed and satisfied after a feeding.   Your baby nurses at least 8 12 times in a 24 hour time period. Nurse your baby until he or she unlatches or falls asleep at the first breast (at least 10 20 minutes), then offer the second side.   Your baby is wetting 5 6 disposable diapers (6 8 cloth diapers) in a 24 hour period by 5 6 days of age.   Your baby is having at least 3 4 stools every 24 hours for the first 6 weeks. The stool should be soft and yellow.   Your baby should gain 4 7 ounces per week after he or she is 4 days old.   Your breasts feel softer after nursing.  REDUCING BREAST ENGORGEMENT  In the first week after your baby is born, you may experience signs of breast engorgement. When breasts are engorged, they feel heavy, warm, full, and may be tender to the touch. You can reduce  engorgement if you:   Nurse frequently, every 2 3 hours. Mothers who breastfeed early and often have fewer problems with engorgement.   Place light ice packs on your breasts for 10 20 minutes between feedings. This reduces swelling. Wrap the ice packs in a lightweight towel to protect your skin. Bags of frozen vegetables work well for this purpose.   Take a warm shower or apply warm, moist heat to your breast for 5 10 minutes just before each feeding. This increases circulation and helps the milk flow.   Gently massage your breast before and during the feeding. Using your finger tips, massage from the chest wall towards your nipple in a circular motion.   Make sure that the baby empties at least one breast at every feeding before switching sides.   Use a breast pump to empty the breasts if your baby is sleepy or not nursing well. You may also want to pump if you are returning to work oryou feel you are getting engorged.   Avoid bottle feeds, pacifiers, or supplemental feedings of water or juice in place of breastfeeding. Breast milk is all the food your baby needs. It is not necessary for your baby to have water or formula. In fact, to help your breasts make more milk, it is best not to give your baby supplemental feedings during the early weeks.   Be sure the baby is latched   on and positioned properly while breastfeeding.   Wear a supportive bra, avoiding underwire styles.   Eat a balanced diet with enough fluids.   Rest often, relax, and take your prenatal vitamins to prevent fatigue, stress, and anemia.  If you follow these suggestions, your engorgement should improve in 24 48 hours. If you are still experiencing difficulty, call your lactation consultant or caregiver.  CARING FOR YOURSELF Take care of your breasts  Bathe or shower daily.   Avoid using soap on your nipples.   Start feedings on your left breast at one feeding and on your right breast at the next  feeding.   You will notice an increase in your milk supply 2 5 days after delivery. You may feel some discomfort from engorgement, which makes your breasts very firm and often tender. Engorgement "peaks" out within 24 48 hours. In the meantime, apply warm moist towels to your breasts for 5 10 minutes before feeding. Gentle massage and expression of some milk before feeding will soften your breasts, making it easier for your baby to latch on.   Wear a well-fitting nursing bra, and air dry your nipples for a 3 4minutes after each feeding.   Only use cotton bra pads.   Only use pure lanolin on your nipples after nursing. You do not need to wash it off before feeding the baby again. Another option is to express a few drops of breast milk and gently massage it into your nipples.  Take care of yourself  Eat well-balanced meals and nutritious snacks.   Drinking milk, fruit juice, and water to satisfy your thirst (about 8 glasses a day).   Get plenty of rest.  Avoid foods that you notice affect the baby in a bad way.  SEEK MEDICAL CARE IF:   You have difficulty with breastfeeding and need help.   You have a hard, red, sore area on your breast that is accompanied by a fever.   Your baby is too sleepy to eat well or is having trouble sleeping.   Your baby is wetting less than 6 diapers a day, by 5 days of age.   Your baby's skin or white part of his or her eyes is more yellow than it was in the hospital.   You feel depressed.  Document Released: 12/03/2005 Document Revised: 06/03/2012 Document Reviewed: 03/02/2012 [employer] Patient Information 2013 [employer].  

## 2013-02-04 NOTE — Progress Notes (Signed)
Pulse: 71

## 2013-02-04 NOTE — Progress Notes (Signed)
Patient reports that for a month and a half she has been feeling dizzy upon standing, no fainting or LOC. She also complains of SOB after mild exertion. Recovers well. No history of asthma. No vaginal bleeding or discharge. No contractions no abdominal pains. No nausea or vomiting. No headaches no dysuria. Will increase water intake.

## 2013-02-06 ENCOUNTER — Encounter: Payer: Self-pay | Admitting: Family Medicine

## 2013-02-06 ENCOUNTER — Ambulatory Visit (HOSPITAL_COMMUNITY)
Admission: RE | Admit: 2013-02-06 | Discharge: 2013-02-06 | Disposition: A | Discharge status: Home / Self Care | Payer: Medicaid Other | Source: Ambulatory Visit | Attending: Family Medicine | Admitting: Family Medicine

## 2013-02-06 DIAGNOSIS — Z1389 Encounter for screening for other disorder: Secondary | ICD-10-CM | POA: Insufficient documentation

## 2013-02-06 DIAGNOSIS — Z363 Encounter for antenatal screening for malformations: Secondary | ICD-10-CM | POA: Insufficient documentation

## 2013-02-06 DIAGNOSIS — Z3402 Encounter for supervision of normal first pregnancy, second trimester: Secondary | ICD-10-CM

## 2013-02-06 DIAGNOSIS — O358XX0 Maternal care for other (suspected) fetal abnormality and damage, not applicable or unspecified: Secondary | ICD-10-CM | POA: Insufficient documentation

## 2013-02-06 NOTE — Progress Notes (Signed)
Agree with student note. Recommend good hydration. No other significant concerns. Quad screen and anatomy scan ordered today.

## 2013-02-11 ENCOUNTER — Encounter: Payer: Self-pay | Admitting: *Deleted

## 2013-02-11 ENCOUNTER — Encounter: Payer: Self-pay | Admitting: Family Medicine

## 2013-03-04 ENCOUNTER — Encounter: Payer: Medicaid Other | Admitting: Obstetrics and Gynecology

## 2013-03-11 ENCOUNTER — Encounter: Payer: Medicaid Other | Admitting: Obstetrics and Gynecology

## 2013-04-08 ENCOUNTER — Other Ambulatory Visit: Payer: Self-pay | Admitting: Family

## 2013-04-08 ENCOUNTER — Encounter: Payer: Self-pay | Admitting: Obstetrics & Gynecology

## 2013-04-08 ENCOUNTER — Ambulatory Visit (INDEPENDENT_AMBULATORY_CARE_PROVIDER_SITE_OTHER): Payer: Medicaid Other | Admitting: Obstetrics & Gynecology

## 2013-04-08 VITALS — BP 120/64 | Wt 145.0 lb

## 2013-04-08 DIAGNOSIS — Z3490 Encounter for supervision of normal pregnancy, unspecified, unspecified trimester: Secondary | ICD-10-CM

## 2013-04-08 DIAGNOSIS — Z3400 Encounter for supervision of normal first pregnancy, unspecified trimester: Secondary | ICD-10-CM

## 2013-04-08 LAB — POCT URINALYSIS DIP (DEVICE)
Ketones, ur: NEGATIVE mg/dL
Protein, ur: NEGATIVE mg/dL
Specific Gravity, Urine: 1.02 (ref 1.005–1.030)
pH: 7 (ref 5.0–8.0)

## 2013-04-08 LAB — CBC
HCT: 31.3 % — ABNORMAL LOW (ref 36.0–46.0)
MCH: 29.3 pg (ref 26.0–34.0)
MCHC: 33.2 g/dL (ref 30.0–36.0)
MCV: 88.2 fL (ref 78.0–100.0)
RDW: 14.4 % (ref 11.5–15.5)

## 2013-04-08 NOTE — Progress Notes (Signed)
Pt with no problems.  +FM, No VB, no ctx, No LOF  1hr GTT, RPR, hct, HIV  today

## 2013-04-08 NOTE — Progress Notes (Signed)
Patient reports nose bleeds.  Sometimes when patient is at work she gets light headed and has hot flashes.  Feels like she might faint.  She rests and drinks water and it sometimes makes her feel better.

## 2013-04-08 NOTE — Patient Instructions (Addendum)
Glucose Tolerance Test During Pregnancy The glucose tolerance test (GTT) or 3-hour glucose test can be used to determine if a woman has diabetes that first begins or is first recognized during pregnancy (gestational diabetes). Typically, a GTT is done after you have had a 1-hour glucose test with results that indicate you possibly have gestational diabetes.  The test takes about 3 hours. There will be a series of blood tests after you drink the sugar water solution. You must remain at the testing location to make sure that your blood is drawn on time.  LET YOUR CAREGIVER KNOW ABOUT:  Allergies to food or medicine.  Medicines taken, including vitamins, herbs, eyedrops, over-the-counter medicines, and creams.  Any recent illnesses or infections. BEFORE THE PROCEDURE The GTT is a fasting test, meaning you must stop eating for a certain amount of time. The test will be the most accurate if you have not eaten for 8 12 hours before the test. For this reason, it is recommended that you have this test done in the morning before you have breakfast. PROCEDURE  Do not eat or drink anything but water during the test. When you arrive at the lab, a sample of your blood is taken to get your fasting blood glucose level. After your fasting glucose level is determined, you will be given a sugar water solution to drink. You will be asked to wait in a certain area until your next blood test. The blood tests are done each hour for 3 hours. Stay close to the lab so your blood samples can be taken on time. This is important. If the blood samples are not taken on time, the test will need to be done again on another day.  AFTER THE PROCEDURE  You can eat and drink as usual.   Ask when your test results will be ready. Make sure you get your test results. A positive test is considered when two of the four blood test values are equal or above the normal blood glucose level. Document Released: 06/03/2012 Document Reviewed:  03/27/2012 [employer] Patient Information 2013 [employer].   

## 2013-04-09 LAB — GLUCOSE TOLERANCE, 1 HOUR (50G) W/O FASTING: Glucose, 1 Hour GTT: 85 mg/dL (ref 70–140)

## 2013-04-09 LAB — HIV ANTIBODY (ROUTINE TESTING W REFLEX): HIV: NONREACTIVE

## 2013-04-10 ENCOUNTER — Encounter: Payer: Self-pay | Admitting: Obstetrics & Gynecology

## 2013-04-10 DIAGNOSIS — O99013 Anemia complicating pregnancy, third trimester: Secondary | ICD-10-CM | POA: Insufficient documentation

## 2013-04-14 ENCOUNTER — Telehealth: Payer: Self-pay | Admitting: *Deleted

## 2013-04-14 MED ORDER — FERROUS SULFATE 325 (65 FE) MG PO TABS: 325.0000 mg | ORAL_TABLET | Freq: Two times a day (BID) | ORAL | Status: DC

## 2013-04-14 NOTE — Telephone Encounter (Addendum)
Message copied by Jill Side on Tue Apr 14, 2013  3:17 PM ------      Message from: Willodean Rosenthal      Created: Fri Apr 10, 2013 12:07 PM       Please call pt.  She needs to take FeSO4 bid due to anemia in pregnancy.            Thx,            clh-S ------Called pt and left message on her personal voice mail that her test results indicate she has low iron level in her blood. Please begin taking iron supplement twice daily. A prescription has been sent to your pharmacy.

## 2013-04-24 ENCOUNTER — Encounter (HOSPITAL_COMMUNITY): Payer: Self-pay | Admitting: *Deleted

## 2013-04-24 ENCOUNTER — Emergency Department (HOSPITAL_COMMUNITY)
Admission: EM | Admit: 2013-04-24 | Discharge: 2013-04-24 | Disposition: A | Discharge status: Home / Self Care | Payer: Medicaid Other | Attending: Emergency Medicine | Admitting: Emergency Medicine

## 2013-04-24 DIAGNOSIS — R51 Headache: Secondary | ICD-10-CM | POA: Insufficient documentation

## 2013-04-24 DIAGNOSIS — Z8719 Personal history of other diseases of the digestive system: Secondary | ICD-10-CM | POA: Insufficient documentation

## 2013-04-24 DIAGNOSIS — O331 Maternal care for disproportion due to generally contracted pelvis: Secondary | ICD-10-CM | POA: Insufficient documentation

## 2013-04-24 DIAGNOSIS — T148XXA Other injury of unspecified body region, initial encounter: Secondary | ICD-10-CM

## 2013-04-24 DIAGNOSIS — O9989 Other specified diseases and conditions complicating pregnancy, childbirth and the puerperium: Secondary | ICD-10-CM | POA: Insufficient documentation

## 2013-04-24 DIAGNOSIS — M549 Dorsalgia, unspecified: Secondary | ICD-10-CM | POA: Insufficient documentation

## 2013-04-24 DIAGNOSIS — Y92410 Unspecified street and highway as the place of occurrence of the external cause: Secondary | ICD-10-CM | POA: Insufficient documentation

## 2013-04-24 DIAGNOSIS — Y9389 Activity, other specified: Secondary | ICD-10-CM | POA: Insufficient documentation

## 2013-04-24 MED ORDER — ACETAMINOPHEN 325 MG PO TABS
650.0000 mg | ORAL_TABLET | Freq: Once | ORAL | Status: AC
  Administered 2013-04-24: 650 mg via ORAL
  Filled 2013-04-24: qty 2
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
## 2013-04-24 NOTE — ED Notes (Signed)
Dammen, PA at bedside evaluating patient. Rapid Response OB nurse paged.

## 2013-04-24 NOTE — ED Notes (Signed)
Monitoring by [facility] nurse complete. Patient asleep on stretcher in no acute distress.

## 2013-04-24 NOTE — ED Provider Notes (Signed)
Medical screening examination/treatment/procedure(s) were performed by non-physician practitioner and as supervising physician I was immediately available for consultation/collaboration.  John-Adam Derl Abalos, M.D.   John-Adam Shahin Knierim, MD 04/24/13 [id number] 

## 2013-04-24 NOTE — ED Notes (Signed)
Rapid response OB nurse Olegario Messier at bedside.

## 2013-04-24 NOTE — ED Notes (Signed)
Patient's ride called for transportation home.
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
## 2013-04-24 NOTE — ED Notes (Signed)
Patient ambulated to bathroom with OB nurse for stand-by assist; gait steady.

## 2013-04-24 NOTE — Progress Notes (Signed)
Dr. Emelda Fear notified of pt @ [facility] and pt status. Pt requires 4hr monitoring, per Dr. Emelda Fear. Discussed w/EDMD; will complete monitoring @ [facility].
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
## 2013-04-29 ENCOUNTER — Ambulatory Visit (INDEPENDENT_AMBULATORY_CARE_PROVIDER_SITE_OTHER): Payer: Medicaid Other | Admitting: Obstetrics and Gynecology

## 2013-04-29 VITALS — BP 120/72 | Temp 97.1°F | Wt 159.0 lb

## 2013-04-29 DIAGNOSIS — D509 Iron deficiency anemia, unspecified: Secondary | ICD-10-CM

## 2013-04-29 DIAGNOSIS — O99019 Anemia complicating pregnancy, unspecified trimester: Secondary | ICD-10-CM

## 2013-04-29 DIAGNOSIS — R55 Syncope and collapse: Secondary | ICD-10-CM

## 2013-04-29 DIAGNOSIS — O99013 Anemia complicating pregnancy, third trimester: Secondary | ICD-10-CM

## 2013-04-29 LAB — POCT URINALYSIS DIP (DEVICE)
Ketones, ur: NEGATIVE mg/dL
Protein, ur: 30 mg/dL — AB
Specific Gravity, Urine: 1.025 (ref 1.005–1.030)
pH: 6.5 (ref 5.0–8.0)

## 2013-04-29 NOTE — Patient Instructions (Signed)
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
## 2013-04-29 NOTE — Progress Notes (Signed)
Iron defic anemia> discussed diet and FeSO4 1 tab bid.

## 2013-04-29 NOTE — Progress Notes (Signed)
Pulse- 74 Patient reports occasional contractions & pelvic pressure

## 2013-05-06 ENCOUNTER — Inpatient Hospital Stay (HOSPITAL_COMMUNITY)
Admission: AD | Admit: 2013-05-06 | Discharge: 2013-05-06 | Disposition: A | Discharge status: Home / Self Care | Payer: Medicaid Other | Source: Ambulatory Visit | Attending: Family Medicine | Admitting: Family Medicine

## 2013-05-06 ENCOUNTER — Encounter (HOSPITAL_COMMUNITY): Payer: Self-pay | Admitting: *Deleted

## 2013-05-06 DIAGNOSIS — O99013 Anemia complicating pregnancy, third trimester: Secondary | ICD-10-CM

## 2013-05-06 DIAGNOSIS — M549 Dorsalgia, unspecified: Secondary | ICD-10-CM | POA: Insufficient documentation

## 2013-05-06 DIAGNOSIS — O99891 Other specified diseases and conditions complicating pregnancy: Secondary | ICD-10-CM | POA: Insufficient documentation

## 2013-05-06 DIAGNOSIS — O99019 Anemia complicating pregnancy, unspecified trimester: Secondary | ICD-10-CM

## 2013-05-06 LAB — URINALYSIS, ROUTINE W REFLEX MICROSCOPIC
Glucose, UA: NEGATIVE mg/dL
Ketones, ur: 15 mg/dL — AB
Leukocytes, UA: NEGATIVE
pH: 6 (ref 5.0–8.0)

## 2013-05-06 MED ORDER — OXYCODONE-ACETAMINOPHEN 5-325 MG PO TABS
1.0000 | ORAL_TABLET | Freq: Once | ORAL | Status: AC
  Administered 2013-05-06: 1 via ORAL
  Filled 2013-05-06: qty 1

## 2013-05-06 MED ORDER — TRAMADOL HCL 50 MG PO TABS
100.0000 mg | ORAL_TABLET | Freq: Once | ORAL | Status: AC
  Administered 2013-05-06: 100 mg via ORAL
  Filled 2013-05-06: qty 2

## 2013-05-06 NOTE — MAU Provider Note (Signed)
  History     CSN: [id number]  Arrival date and time: 05/06/13 0436   None     Chief Complaint  Patient presents with  . Back Pain   HPI [facility] is a 27yo [id number] at 33.5wks who presents with complaint of back pain. Denies leak, bldg, dysuria, N/V/D. No reg ctx. Her preg has been followed by the [facility] and has been remarkable for echogenic focus LV.  OB History   Grav Para Term Preterm Abortions TAB SAB Ect Mult Living   4 1 1  2 1 1   1       Past Medical History  Diagnosis Date  . Gallstones     Past Surgical History  Procedure Laterality Date  . Induced abortion  02/2012  . Cholecystectomy  08/28/2012    Procedure: LAPAROSCOPIC CHOLECYSTECTOMY WITH INTRAOPERATIVE CHOLANGIOGRAM;  Surgeon: [name], DO;  Location: [facility] OR;  Service: General;  Laterality: N/A;    Family History  Problem Relation Age of Onset  . Other Neg Hx   . Diabetes Neg Hx   . Heart disease Neg Hx   . Hyperlipidemia Neg Hx   . Hypertension Neg Hx   . Stroke Neg Hx     History  Substance Use Topics  . Smoking status: Never Smoker   . Smokeless tobacco: Never Used  . Alcohol Use: No    Allergies: No Known Allergies  Prescriptions prior to admission  Medication Sig Dispense Refill  . acetaminophen (TYLENOL) 500 MG tablet Take 1,000 mg by mouth every 6 (six) hours as needed for pain.      . Prenatal Multivit-Min-Fe-FA (PRENATAL VITAMINS) 0.8 MG tablet Take 1 tablet by mouth daily.  30 tablet  12  . [DISCONTINUED] ferrous sulfate 325 (65 FE) MG tablet Take 1 tablet (325 mg total) by mouth 2 (two) times daily.  60 tablet  3    ROS Physical Exam   Blood pressure 121/61, pulse 79, temperature 98 F (36.7 C), temperature source Oral, resp. rate 18, last menstrual period 09/12/2012.  Physical Exam  Constitutional: She is oriented to person, place, and time. She appears well-developed.  HENT:  Head: Normocephalic.  Cardiovascular: Normal rate.   Respiratory: Effort normal.  GI: Soft.   FHR 125 +accels, no decels Rare ctx per toco; mild  Genitourinary: Vagina normal.  Cx C/L/post  Musculoskeletal: Normal range of motion.  Neurological: She is alert and oriented to person, place, and time.  Skin: Skin is warm and dry.  Psychiatric: She has a normal mood and affect. Her behavior is normal. Thought content normal.   Urinalysis    Component Value Date/Time   COLORURINE YELLOW 05/06/2013 0445   APPEARANCEUR CLEAR 05/06/2013 0445   LABSPEC 1.010 05/06/2013 0445   PHURINE 6.0 05/06/2013 0445   GLUCOSEU NEGATIVE 05/06/2013 0445   HGBUR NEGATIVE 05/06/2013 0445   BILIRUBINUR NEGATIVE 05/06/2013 0445   KETONESUR 15* 05/06/2013 0445   PROTEINUR NEGATIVE 05/06/2013 0445   UROBILINOGEN 1.0 05/06/2013 0445   NITRITE NEGATIVE 05/06/2013 0445   LEUKOCYTESUR NEGATIVE 05/06/2013 0445      MAU Course  Procedures  Given Tramadol without relief, then Percocet #1  Assessment and Plan  IUP at 33.5wks Back pain  Discomfort relieved with medication- ready for discharge D/C home with comfort tips rev'd F/U as scheduled at [facility] 05/06/2013, 6:44 AM

## 2013-05-06 NOTE — MAU Note (Signed)
Pt reports that she was sleeping and then awoke with back pain which comes and goes

## 2013-05-07 NOTE — MAU Provider Note (Signed)
Chart reviewed and agree with management and plan.  

## 2013-05-13 ENCOUNTER — Encounter: Payer: Self-pay | Admitting: Obstetrics and Gynecology

## 2013-05-13 ENCOUNTER — Encounter: Payer: Self-pay | Admitting: *Deleted

## 2013-05-13 ENCOUNTER — Ambulatory Visit (INDEPENDENT_AMBULATORY_CARE_PROVIDER_SITE_OTHER): Payer: Medicaid Other | Admitting: Obstetrics and Gynecology

## 2013-05-13 VITALS — BP 121/68 | Temp 98.0°F | Wt 163.5 lb

## 2013-05-13 DIAGNOSIS — O99013 Anemia complicating pregnancy, third trimester: Secondary | ICD-10-CM

## 2013-05-13 DIAGNOSIS — O99019 Anemia complicating pregnancy, unspecified trimester: Secondary | ICD-10-CM

## 2013-05-13 LAB — POCT URINALYSIS DIP (DEVICE)
Ketones, ur: NEGATIVE mg/dL
Protein, ur: NEGATIVE mg/dL
Specific Gravity, Urine: 1.02 (ref 1.005–1.030)

## 2013-05-13 NOTE — Progress Notes (Signed)
Pulse- 84 Patient reports abdominal pressure & back pain and occasional contractions

## 2013-05-13 NOTE — Progress Notes (Signed)
Seen MAU last week for LBP> was given Percocet but vomited it. Pain better now. Not feeling UCs. Encouraged to try breastfeeding.

## 2013-05-13 NOTE — Patient Instructions (Signed)
Breastfeeding A change in hormones during your pregnancy causes growth of your breast tissue and an increase in number and size of milk ducts. The hormone prolactin allows proteins, sugars, and fats from your blood supply to make breast milk in your milk-producing glands. The hormone progesterone prevents breast milk from being released before the birth of your baby. After the birth of your baby, your progesterone level decreases allowing breast milk to be released. Thoughts of your baby, as well as his or her sucking or crying, can stimulate the release of milk from the milk-producing glands. Deciding to breastfeed (nurse) is one of the best choices you can make for you and your baby. The information that follows gives a brief review of the benefits, as well as other important skills to know about breastfeeding. BENEFITS OF BREASTFEEDING For your baby  The first milk (colostrum) helps your baby's digestive system function better.   There are antibodies in your milk that help your baby fight off infections.   Your baby has a lower incidence of asthma, allergies, and sudden infant death syndrome (SIDS).   The nutrients in breast milk are better for your baby than infant formulas.  Breast milk improves your baby's brain development.   Your baby will have less gas, colic, and constipation.  Your baby is less likely to develop other conditions, such as childhood obesity, asthma, or diabetes mellitus. For you  Breastfeeding helps develop a very special bond between you and your baby.   Breastfeeding is convenient, always available at the correct temperature, and costs nothing.   Breastfeeding helps to burn calories and helps you lose the weight gained during pregnancy.   Breastfeeding makes your uterus contract back down to normal size faster and slows bleeding following delivery.   Breastfeeding mothers have a lower risk of developing osteoporosis or breast or ovarian cancer later  in life.  BREASTFEEDING FREQUENCY  A healthy, full-term baby may breastfeed as often as every hour or space his or her feedings to every 3 hours. Breastfeeding frequency will vary from baby to baby.   Newborns should be fed no less than every 2 3 hours during the day and every 4 5 hours during the night. You should breastfeed a minimum of 8 feedings in a 24 hour period.  Awaken your baby to breastfeed if it has been 3 4 hours since the last feeding.  Breastfeed when you feel the need to reduce the fullness of your breasts or when your newborn shows signs of hunger. Signs that your baby may be hungry include:  Increased alertness or activity.  Stretching.  Movement of the head from side to side.  Movement of the head and opening of the mouth when the corner of the mouth or cheek is stroked (rooting).  Increased sucking sounds, smacking lips, cooing, sighing, or squeaking.  Hand-to-mouth movements.  Increased sucking of fingers or hands.  Fussing.  Intermittent crying.  Signs of extreme hunger will require calming and consoling before you try to feed your baby. Signs of extreme hunger may include:  Restlessness.  A loud, strong cry.  Screaming.  Frequent feeding will help you make more milk and will help prevent problems, such as sore nipples and engorgement of the breasts.  BREASTFEEDING   Whether lying down or sitting, be sure that the baby's abdomen is facing your abdomen.   Support your breast with 4 fingers under your breast and your thumb above your nipple. Make sure your fingers are well away from   your nipple and your baby's mouth.   Stroke your baby's lips gently with your finger or nipple.   When your baby's mouth is open wide enough, place all of your nipple and as much of the colored area around your nipple (areola) as possible into your baby's mouth.  More areola should be visible above his or her upper lip than below his or her lower lip.  Your  baby's tongue should be between his or her lower gum and your breast.  Ensure that your baby's mouth is correctly positioned around the nipple (latched). Your baby's lips should create a seal on your breast.  Signs that your baby has effectively latched onto your nipple include:  Tugging or sucking without pain.  Swallowing heard between sucks.  Absent click or smacking sound.  Muscle movement above and in front of his or her ears with sucking.  Your baby must suck about 2 3 minutes in order to get your milk. Allow your baby to feed on each breast as long as he or she wants. Nurse your baby until he or she unlatches or falls asleep at the first breast, then offer the second breast.  Signs that your baby is full and satisfied include:  A gradual decrease in the number of sucks or complete cessation of sucking.  Falling asleep.  Extension or relaxation of his or her body.  Retention of a small amount of milk in his or her mouth.  Letting go of your breast by himself or herself.  Signs of effective breastfeeding in you include:  Breasts that have increased firmness, weight, and size prior to feeding.  Breasts that are softer after nursing.  Increased milk volume, as well as a change in milk consistency and color by the 5th day of breastfeeding.  Breast fullness relieved by breastfeeding.  Nipples are not sore, cracked, or bleeding.  If needed, break the suction by putting your finger into the corner of your baby's mouth and sliding your finger between his or her gums. Then, remove your breast from his or her mouth.  It is common for babies to spit up a small amount after a feeding.  Babies often swallow air during feeding. This can make babies fussy. Burping your baby between breasts can help with this.  Vitamin D supplements are recommended for babies who get only breast milk.  Avoid using a pacifier during your baby's first 4 6 weeks.  Avoid supplemental feedings of  water, formula, or juice in place of breastfeeding. Breast milk is all the food your baby needs. It is not necessary for your baby to have water or formula. Your breasts will make more milk if supplemental feedings are avoided during the early weeks. HOW TO TELL WHETHER YOUR BABY IS GETTING ENOUGH BREAST MILK Wondering whether or not your baby is getting enough milk is a common concern among mothers. You can be assured that your baby is getting enough milk if:   Your baby is actively sucking and you hear swallowing.   Your baby seems relaxed and satisfied after a feeding.   Your baby nurses at least 8 12 times in a 24 hour time period.  During the first 3 5 days of age:  Your baby is wetting at least 3 5 diapers in a 24 hour period. The urine should be clear and pale yellow.  Your baby is having at least 3 4 stools in a 24 hour period. The stool should be soft and yellow.  At   5 7 days of age, your baby is having at least 3 6 stools in a 24 hour period. The stool should be seedy and yellow by 5 days of age.  Your baby has a weight loss less than 7 10% during the first 3 days of age.  Your baby does not lose weight after 3 7 days of age.  Your baby gains 4 7 ounces each week after he or she is 4 days of age.  Your baby gains weight by 5 days of age and is back to birth weight within 2 weeks. ENGORGEMENT In the first week after your baby is born, you may experience extremely full breasts (engorgement). When engorged, your breasts may feel heavy, warm, or tender to the touch. Engorgement peaks within 24 48 hours after delivery of your baby.  Engorgement may be reduced by:  Continuing to breastfeed.  Increasing the frequency of breastfeeding.  Taking warm showers or applying warm, moist heat to your breasts just before each feeding. This increases circulation and helps the milk flow.   Gently massaging your breast before and during the feedings. With your fingertips, massage from  your chest wall towards your nipple in a circular motion.   Ensuring that your baby empties at least one breast at every feeding. It also helps to start the next feeding on the opposite breast.   Expressing breast milk by hand or by using a breast pump to empty the breasts if your baby is sleepy, or not nursing well. You may also want to express milk if you are returning to work oryou feel you are getting engorged.  Ensuring your baby is latched on and positioned properly while breastfeeding. If you follow these suggestions, your engorgement should improve in 24 48 hours. If you are still experiencing difficulty, call your lactation consultant or caregiver.  CARING FOR YOURSELF Take care of your breasts.  Bathe or shower daily.   Avoid using soap on your nipples.   Wear a supportive bra. Avoid wearing underwire style bras.  Air dry your nipples for a 3 4minutes after each feeding.   Use only cotton bra pads to absorb breast milk leakage. Leaking of breast milk between feedings is normal.   Use only pure lanolin on your nipples after nursing. You do not need to wash it off before feeding your baby again. Another option is to express a few drops of breast milk and gently massage that milk into your nipples.  Continue breast self-awareness checks. Take care of yourself.  Eat healthy foods. Alternate 3 meals with 3 snacks.  Avoid foods that you notice affect your baby in a bad way.  Drink milk, fruit juice, and water to satisfy your thirst (about 8 glasses a day).   Rest often, relax, and take your prenatal vitamins to prevent fatigue, stress, and anemia.  Avoid chewing and smoking tobacco.  Avoid alcohol and drug use.  Take over-the-counter and prescribed medicine only as directed by your caregiver or pharmacist. You should always check with your caregiver or pharmacist before taking any new medicine, vitamin, or herbal supplement.  Know that pregnancy is possible while  breastfeeding. If desired, talk to your caregiver about family planning and safe birth control methods that may be used while breastfeeding. SEEK MEDICAL CARE IF:   You feel like you want to stop breastfeeding or have become frustrated with breastfeeding.  You have painful breasts or nipples.  Your nipples are cracked or bleeding.  Your breasts are red, tender,   or warm.  You have a swollen area on either breast.  You have a fever or chills.  You have nausea or vomiting.  You have drainage from your nipples.  Your breasts do not become full before feedings by the 5th day after delivery.  You feel sad and depressed.  Your baby is too sleepy to eat well.  Your baby is having trouble sleeping.   Your baby is wetting less than 3 diapers in a 24 hour period.  Your baby has less than 3 stools in a 24 hour period.  Your baby's skin or the white part of his or her eyes becomes more yellow.   Your baby is not gaining weight by 5 days of age. MAKE SURE YOU:   Understand these instructions.  Will watch your condition.  Will get help right away if you are not doing well or get worse. Document Released: 12/03/2005 Document Revised: 08/27/2012 Document Reviewed: 07/09/2012 [employer] Patient Information 2014 [employer].  

## 2013-05-14 ENCOUNTER — Encounter: Payer: Self-pay | Admitting: *Deleted

## 2013-05-27 ENCOUNTER — Ambulatory Visit (INDEPENDENT_AMBULATORY_CARE_PROVIDER_SITE_OTHER): Payer: Medicaid Other | Admitting: Obstetrics and Gynecology

## 2013-05-27 VITALS — BP 132/66 | Temp 98.2°F | Wt 166.8 lb

## 2013-05-27 DIAGNOSIS — Z3483 Encounter for supervision of other normal pregnancy, third trimester: Secondary | ICD-10-CM

## 2013-05-27 DIAGNOSIS — O99019 Anemia complicating pregnancy, unspecified trimester: Secondary | ICD-10-CM

## 2013-05-27 LAB — POCT URINALYSIS DIP (DEVICE)
Bilirubin Urine: NEGATIVE
Glucose, UA: NEGATIVE mg/dL
Hgb urine dipstick: NEGATIVE
Leukocytes, UA: NEGATIVE
Nitrite: NEGATIVE

## 2013-05-27 LAB — OB RESULTS CONSOLE GBS: GBS: NEGATIVE

## 2013-05-27 NOTE — Progress Notes (Signed)
Cultures done. On vits and iron. LBP resolved.

## 2013-05-27 NOTE — Progress Notes (Signed)
Pulse: 72

## 2013-05-28 LAB — GC/CHLAMYDIA PROBE AMP
CT Probe RNA: NEGATIVE
GC Probe RNA: NEGATIVE

## 2013-05-30 LAB — CULTURE, BETA STREP (GROUP B ONLY)

## 2013-06-03 ENCOUNTER — Encounter: Payer: Self-pay | Admitting: Advanced Practice Midwife

## 2013-06-03 ENCOUNTER — Encounter: Payer: Medicaid Other | Admitting: Advanced Practice Midwife

## 2013-06-03 ENCOUNTER — Ambulatory Visit (INDEPENDENT_AMBULATORY_CARE_PROVIDER_SITE_OTHER): Payer: Medicaid Other | Admitting: Advanced Practice Midwife

## 2013-06-03 VITALS — BP 111/68 | Temp 99.1°F | Wt 169.0 lb

## 2013-06-03 DIAGNOSIS — Z3402 Encounter for supervision of normal first pregnancy, second trimester: Secondary | ICD-10-CM

## 2013-06-03 DIAGNOSIS — O99019 Anemia complicating pregnancy, unspecified trimester: Secondary | ICD-10-CM

## 2013-06-03 DIAGNOSIS — O99013 Anemia complicating pregnancy, third trimester: Secondary | ICD-10-CM

## 2013-06-03 LAB — POCT URINALYSIS DIP (DEVICE)
Bilirubin Urine: NEGATIVE
Glucose, UA: NEGATIVE mg/dL
Ketones, ur: NEGATIVE mg/dL

## 2013-06-03 NOTE — Progress Notes (Signed)
Doing ok but ready to have this baby. Reviewed signs of labor.

## 2013-06-03 NOTE — Progress Notes (Signed)
Pulse: 71

## 2013-06-03 NOTE — Progress Notes (Signed)
Pulse: 79

## 2013-06-03 NOTE — Patient Instructions (Signed)
Vaginal Delivery  Your caregiver must first be sure you are in labor. Signs of labor include:   You may pass what is called "the mucus plug" before labor begins. This is a small amount of blood stained mucus.   Regular uterine contractions.   The time between contractions get closer together.   The discomfort and pain gradually gets more intense.   Pains are mostly located in the back.   Pains get worse when walking.   The cervix (the opening of the uterus) becomes thinner (begins to efface) and opens up (dilates).  Once you are in labor and admitted into the hospital or care center, your caregiver will do the following:   A complete physical examination.   Check your vital signs (blood pressure, pulse, temperature and the fetal heart rate).   Do a vaginal examination (using a sterile glove and lubricant) to determine:   The position (presentation) of the baby (head [vertex] or buttock first).   The level (station) of the baby's head in the birth canal.   The effacement and dilatation of the cervix.   You may have your pubic hair shaved and be given an enema depending on your caregiver and the circumstance.   An electronic monitor is usually placed on your abdomen. The monitor follows the length and intensity of the contractions, as well as the baby's heart rate.   Usually, your caregiver will insert an IV in your arm with a bottle of sugar water. This is done as a precaution so that medications can be given to you quickly during labor or delivery.  NORMAL LABOR AND DELIVERY IS DIVIDED UP INTO 3 STAGES:  First Stage  This is when regular contractions begin and the cervix begins to efface and dilate. This stage can last from 3 to 15 hours. The end of the first stage is when the cervix is 100% effaced and 10 centimeters dilated. Pain medications may be given by    Injection (morphine, demerol, etc.)    Regional anesthesia (spinal, caudal or epidural, anesthetics given in different locations of the spine). Paracervical pain medication may be given, which is an injection of and anesthetic on each side of the cervix.  A pregnant woman may request to have "Natural Childbirth" which is not to have any medications or anesthesia during her labor and delivery.  Second Stage  This is when the baby comes down through the birth canal (vagina) and is born. This can take 1 to 4 hours. As the baby's head comes down through the birth canal, you may feel like you are going to have a bowel movement. You will get the urge to bear down and push until the baby is delivered. As the baby's head is being delivered, the caregiver will decide if an episiotomy (a cut in the perineum and vagina area) is needed to prevent tearing of the tissue in this area. The episiotomy is sewn up after the delivery of the baby and placenta. Sometimes a mask with nitrous oxide is given for the mother to breath during the delivery of the baby to help if there is too much pain. The end of Stage 2 is when the baby is fully delivered. Then when the umbilical cord stops pulsating it is clamped and cut.  Third Stage  The third stage begins after the baby is completely delivered and ends after the placenta (afterbirth) is delivered. This usually takes 5 to 30 minutes. After the placenta is delivered, a medication is given   either by intravenous or injection to help contract the uterus and prevent bleeding. The third stage is not painful and pain medication is usually not necessary. If an episiotomy was done, it is repaired at this time.  After the delivery, the mother is watched and monitored closely for 1 to 2 hours to make sure there is no postpartum bleeding (hemorrhage). If there is a lot of bleeding, medication is given to contract the uterus and stop the bleeding.  Document Released: 09/11/2008 Document Revised: 08/27/2012 Document Reviewed: 09/11/2008   Exit[employer] Patient Information 2014 [employer].

## 2013-06-10 ENCOUNTER — Ambulatory Visit (INDEPENDENT_AMBULATORY_CARE_PROVIDER_SITE_OTHER): Payer: Medicaid Other | Admitting: Advanced Practice Midwife

## 2013-06-10 ENCOUNTER — Encounter: Payer: Self-pay | Admitting: Advanced Practice Midwife

## 2013-06-10 VITALS — BP 118/71 | Temp 97.4°F | Wt 170.5 lb

## 2013-06-10 DIAGNOSIS — O99013 Anemia complicating pregnancy, third trimester: Secondary | ICD-10-CM

## 2013-06-10 DIAGNOSIS — O99019 Anemia complicating pregnancy, unspecified trimester: Secondary | ICD-10-CM

## 2013-06-10 LAB — POCT URINALYSIS DIP (DEVICE)
Bilirubin Urine: NEGATIVE
Glucose, UA: NEGATIVE mg/dL
Ketones, ur: NEGATIVE mg/dL
Leukocytes, UA: NEGATIVE
Specific Gravity, Urine: 1.02 (ref 1.005–1.030)

## 2013-06-10 NOTE — Patient Instructions (Signed)
Vaginal Delivery Your caregiver must first be sure you are in labor. Signs of labor include:  You may pass what is called "the mucus plug" before labor begins. This is a small amount of blood stained mucus.  Regular uterine contractions.  The time between contractions get closer together.  The discomfort and pain gradually gets more intense.  Pains are mostly located in the back.  Pains get worse when walking.  The cervix (the opening of the uterus) becomes thinner (begins to efface) and opens up (dilates). Once you are in labor and admitted into the hospital or care center, your caregiver will do the following:  A complete physical examination.  Check your vital signs (blood pressure, pulse, temperature and the fetal heart rate).  Do a vaginal examination (using a sterile glove and lubricant) to determine:  The position (presentation) of the baby (head [vertex] or buttock first).  The level (station) of the baby's head in the birth canal.  The effacement and dilatation of the cervix.  An electronic monitor is usually placed on your abdomen. The monitor follows the length and intensity of the contractions, as well as the baby's heart rate.  Usually, your caregiver will insert an IV in your arm with a bottle of sugar water. This is done as a precaution so that medications can be given to you quickly during labor or delivery. NORMAL LABOR AND DELIVERY IS DIVIDED UP INTO 3 STAGES: First Stage This is when regular contractions begin and the cervix begins to efface and dilate. This stage can last from 3 to 15 hours. The end of the first stage is when the cervix is 100% effaced and 10 centimeters dilated. Pain medications may be given by   Injection (morphine, demerol, etc.)  Regional anesthesia (spinal, caudal or epidural, anesthetics given in different locations of the spine). Paracervical pain medication may be given, which is an injection of and anesthetic on each side of the  cervix. A pregnant woman may request to have "Natural Childbirth" which is not to have any medications or anesthesia during her labor and delivery. Second Stage This is when the baby comes down through the birth canal (vagina) and is born. This can take 1 to 4 hours. As the baby's head comes down through the birth canal, you may feel like you are going to have a bowel movement. You will get the urge to bear down and push until the baby is delivered. As the baby's head is being delivered, the caregiver will decide if an episiotomy (a cut in the perineum and vagina area) is needed to prevent tearing of the tissue in this area. The episiotomy is sewn up after the delivery of the baby and placenta. Sometimes a mask with nitrous oxide is given for the mother to breath during the delivery of the baby to help if there is too much pain. The end of Stage 2 is when the baby is fully delivered. Then when the umbilical cord stops pulsating it is clamped and cut. Third Stage The third stage begins after the baby is completely delivered and ends after the placenta (afterbirth) is delivered. This usually takes 5 to 30 minutes. After the placenta is delivered, a medication is given either by intravenous or injection to help contract the uterus and prevent bleeding. The third stage is not painful and pain medication is usually not necessary. If an episiotomy was done, it is repaired at this time. After the delivery, the mother is watched and monitored closely for   1 to 2 hours to make sure there is no postpartum bleeding (hemorrhage). If there is a lot of bleeding, medication is given to contract the uterus and stop the bleeding. Document Released: 09/11/2008 Document Revised: 08/27/2012 Document Reviewed: 09/11/2008 [employer] Patient Information 2014 [employer].  

## 2013-06-10 NOTE — Progress Notes (Signed)
Pulse: 79

## 2013-06-10 NOTE — Progress Notes (Signed)
Doing well. Intermittent contractions. Cervical exam deferred.

## 2013-06-14 ENCOUNTER — Encounter (HOSPITAL_COMMUNITY): Payer: Self-pay | Admitting: Obstetrics and Gynecology

## 2013-06-14 ENCOUNTER — Inpatient Hospital Stay (HOSPITAL_COMMUNITY)
Admission: AD | Admit: 2013-06-14 | Discharge: 2013-06-14 | Disposition: A | Discharge status: Home / Self Care | Payer: Medicaid Other | Source: Ambulatory Visit | Attending: Obstetrics & Gynecology | Admitting: Obstetrics & Gynecology

## 2013-06-14 DIAGNOSIS — K5900 Constipation, unspecified: Secondary | ICD-10-CM | POA: Insufficient documentation

## 2013-06-14 DIAGNOSIS — O99019 Anemia complicating pregnancy, unspecified trimester: Secondary | ICD-10-CM

## 2013-06-14 DIAGNOSIS — O99013 Anemia complicating pregnancy, third trimester: Secondary | ICD-10-CM

## 2013-06-14 DIAGNOSIS — O99891 Other specified diseases and conditions complicating pregnancy: Secondary | ICD-10-CM | POA: Insufficient documentation

## 2013-06-14 DIAGNOSIS — K625 Hemorrhage of anus and rectum: Secondary | ICD-10-CM | POA: Insufficient documentation

## 2013-06-14 MED ORDER — HYDROCORTISONE 2.5 % RE CREA: TOPICAL_CREAM | Freq: Two times a day (BID) | RECTAL | Status: DC

## 2013-06-14 MED ORDER — POLYETHYLENE GLYCOL 3350 17 GM/SCOOP PO POWD: 17.0000 g | Freq: Every day | ORAL | Status: DC

## 2013-06-14 MED ORDER — DOCUSATE SODIUM 100 MG PO CAPS: 100.0000 mg | ORAL_CAPSULE | Freq: Two times a day (BID) | ORAL | Status: DC

## 2013-06-14 MED ORDER — FLEET ENEMA 7-19 GM/118ML RE ENEM
1.0000 | ENEMA | Freq: Once | RECTAL | Status: AC
  Administered 2013-06-14: 1 via RECTAL

## 2013-06-14 NOTE — MAU Note (Addendum)
Katherine Lindsey is [redacted]w[redacted]d here today with complaints of constipation. She has not had a BM in over 1 week. She has not called her Dr. And has not tried anything over the counter, the only thing she has tried is drinking more water. She noticed some blood on the toilet paper today after straining to have a BM

## 2013-06-14 NOTE — MAU Note (Signed)
Pt reports increased abd pressure. Felt like she had to have aBM feels constipated and she had blood when she wiped. Good fetal movement felt.

## 2013-06-14 NOTE — MAU Provider Note (Signed)
  History     CSN: [id number]  Arrival date and time: 06/14/13 1818   First Provider Initiated Contact with Patient 06/14/13 2008      Chief Complaint  Patient presents with  . Labor Eval   HPI ORPHA DAIN is a 27 y.o. [phone] at [redacted]w[redacted]d who presents with constipation and rectal bleeding.   Patient reports that she has not had a bowel movement x 1 week.  Today, she noticed some bleeding with wiping after straining on the toilet.  No other complaints currently.  She does report occasional contractions.  No loss of fluid or vaginal bleeding.   Past Medical History  Diagnosis Date  . Gallstones     Past Surgical History  Procedure Laterality Date  . Induced abortion  02/2012  . Cholecystectomy  08/28/2012    Procedure: LAPAROSCOPIC CHOLECYSTECTOMY WITH INTRAOPERATIVE CHOLANGIOGRAM;  Surgeon: [name], DO;  Location: [facility] OR;  Service: General;  Laterality: N/A;    Family History  Problem Relation Age of Onset  . Other Neg Hx   . Diabetes Neg Hx   . Heart disease Neg Hx   . Hyperlipidemia Neg Hx   . Hypertension Neg Hx   . Stroke Neg Hx     History  Substance Use Topics  . Smoking status: Never Smoker   . Smokeless tobacco: Never Used  . Alcohol Use: No    Allergies: No Known Allergies  Prescriptions prior to admission  Medication Sig Dispense Refill  . acetaminophen (TYLENOL) 500 MG tablet Take 1,000 mg by mouth every 6 (six) hours as needed for pain.      . Prenatal Multivit-Min-Fe-FA (PRENATAL VITAMINS) 0.8 MG tablet Take 1 tablet by mouth daily.  30 tablet  12    ROS Per HPI Physical Exam   Blood pressure 123/60, pulse 76, temperature 98.4 F (36.9 C), temperature source Oral, resp. rate 18, height 5\' 8"  (1.727 m), weight 78.926 kg (174 lb), last menstrual period 09/12/2012.  Physical Exam Gen: well appearing, NAD.  Heart: RRR Lungs: CTAB. Abd: gravid but otherwise soft, nontender to palpation Ext: no appreciable lower extremity edema  bilaterally.  Neuro: no focal deficits. Rectal: external hemorrhoids noted. No fissures appreciated. GU: normal appearing external genitalia Cervical exam: Dilation: Closed Effacement (%): Thick Exam by:: Adriana Simas MD  FHR: baseline 120, mod variability, 15x15 accels, no decels Toco: 2-8 min   MAU Course  Procedures  Assessment and Plan  TERRILYNN POSTELL is a 27 y.o. [id number] at [redacted]w[redacted]d who presents with constipation. - Cervix closed. Cat 1 FHR - Fleets enema x 1.  Will discharge home with Miralax and colace.   Everlene Other 06/14/2013, 8:14 PM   I saw and examined patient and agree with above resident note. I reviewed history, imaging, labs, and vitals. I personally reviewed the fetal heart tracing, and it is reactive. Napoleon Form, MD

## 2013-06-15 ENCOUNTER — Inpatient Hospital Stay (HOSPITAL_COMMUNITY)
Admission: AD | Admit: 2013-06-15 | Discharge: 2013-06-19 | DRG: 766 | Disposition: A | Discharge status: Home / Self Care | Payer: Medicaid Other | Source: Ambulatory Visit | Attending: Obstetrics and Gynecology | Admitting: Obstetrics and Gynecology

## 2013-06-15 DIAGNOSIS — O324XX0 Maternal care for high head at term, not applicable or unspecified: Secondary | ICD-10-CM | POA: Diagnosis present

## 2013-06-15 DIAGNOSIS — O99013 Anemia complicating pregnancy, third trimester: Secondary | ICD-10-CM

## 2013-06-16 ENCOUNTER — Inpatient Hospital Stay (HOSPITAL_COMMUNITY): Payer: Medicaid Other | Admitting: Anesthesiology

## 2013-06-16 ENCOUNTER — Encounter (HOSPITAL_COMMUNITY)
Admission: AD | Disposition: A | Discharge status: Home / Self Care | Payer: Self-pay | Source: Ambulatory Visit | Attending: Obstetrics and Gynecology

## 2013-06-16 ENCOUNTER — Encounter (HOSPITAL_COMMUNITY): Payer: Self-pay | Admitting: *Deleted

## 2013-06-16 ENCOUNTER — Encounter (HOSPITAL_COMMUNITY): Payer: Self-pay | Admitting: Anesthesiology

## 2013-06-16 DIAGNOSIS — O324XX0 Maternal care for high head at term, not applicable or unspecified: Secondary | ICD-10-CM

## 2013-06-16 LAB — RPR: RPR Ser Ql: NONREACTIVE

## 2013-06-16 LAB — CBC
Hemoglobin: 12.4 g/dL (ref 12.0–15.0)
Platelets: 173 10*3/uL (ref 150–400)
RBC: 4.14 MIL/uL (ref 3.87–5.11)
WBC: 9.9 10*3/uL (ref 4.0–10.5)

## 2013-06-16 SURGERY — Surgical Case
Anesthesia: Epidural | Site: Abdomen | Wound class: Clean Contaminated

## 2013-06-16 MED ORDER — MORPHINE SULFATE 0.5 MG/ML IJ SOLN
INTRAMUSCULAR | Status: AC
  Filled 2013-06-16: qty 10

## 2013-06-16 MED ORDER — SIMETHICONE 80 MG PO CHEW
80.0000 mg | CHEWABLE_TABLET | Freq: Three times a day (TID) | ORAL | Status: DC
  Administered 2013-06-17 – 2013-06-19 (×8): 80 mg via ORAL

## 2013-06-16 MED ORDER — DIPHENHYDRAMINE HCL 50 MG/ML IJ SOLN: 12.5000 mg | INTRAMUSCULAR | Status: DC | PRN

## 2013-06-16 MED ORDER — MEPERIDINE HCL 25 MG/ML IJ SOLN: 6.2500 mg | INTRAMUSCULAR | Status: DC | PRN

## 2013-06-16 MED ORDER — BUPIVACAINE HCL (PF) 0.25 % IJ SOLN
INTRAMUSCULAR | Status: DC | PRN
  Administered 2013-06-16: 30 mL

## 2013-06-16 MED ORDER — PHENYLEPHRINE 40 MCG/ML (10ML) SYRINGE FOR IV PUSH (FOR BLOOD PRESSURE SUPPORT)
80.0000 ug | PREFILLED_SYRINGE | INTRAVENOUS | Status: AC | PRN
  Administered 2013-06-16: 40 ug via INTRAVENOUS
  Administered 2013-06-16: 80 ug via INTRAVENOUS
  Administered 2013-06-16 (×2): 40 ug via INTRAVENOUS
  Administered 2013-06-16: 80 ug via INTRAVENOUS
  Filled 2013-06-16: qty 5

## 2013-06-16 MED ORDER — EPHEDRINE SULFATE 50 MG/ML IJ SOLN
INTRAMUSCULAR | Status: DC | PRN
  Administered 2013-06-16 (×2): 10 mg via INTRAVENOUS

## 2013-06-16 MED ORDER — OXYCODONE-ACETAMINOPHEN 5-325 MG PO TABS
1.0000 | ORAL_TABLET | ORAL | Status: DC | PRN
Start: 2013-06-16 — End: 2013-06-16

## 2013-06-16 MED ORDER — ONDANSETRON HCL 4 MG/2ML IJ SOLN
INTRAMUSCULAR | Status: DC | PRN
  Administered 2013-06-16: 4 mg via INTRAVENOUS

## 2013-06-16 MED ORDER — CEFAZOLIN SODIUM-DEXTROSE 2-3 GM-% IV SOLR
2.0000 g | Freq: Once | INTRAVENOUS | Status: AC
  Administered 2013-06-16: 2 g via INTRAVENOUS

## 2013-06-16 MED ORDER — NALOXONE HCL 1 MG/ML IJ SOLN: 1.0000 ug/kg/h | INTRAVENOUS | Status: DC | PRN

## 2013-06-16 MED ORDER — LIDOCAINE HCL (PF) 1 % IJ SOLN
30.0000 mL | INTRAMUSCULAR | Status: DC | PRN
  Filled 2013-06-16: qty 30

## 2013-06-16 MED ORDER — SODIUM BICARBONATE 8.4 % IV SOLN
INTRAVENOUS | Status: DC | PRN
  Administered 2013-06-16: 5 mL via EPIDURAL

## 2013-06-16 MED ORDER — EPHEDRINE 5 MG/ML INJ
10.0000 mg | INTRAVENOUS | Status: DC | PRN
  Filled 2013-06-16: qty 4

## 2013-06-16 MED ORDER — PHENYLEPHRINE 40 MCG/ML (10ML) SYRINGE FOR IV PUSH (FOR BLOOD PRESSURE SUPPORT): 80.0000 ug | PREFILLED_SYRINGE | INTRAVENOUS | Status: DC | PRN

## 2013-06-16 MED ORDER — LIDOCAINE HCL (PF) 1 % IJ SOLN
INTRAMUSCULAR | Status: DC | PRN
  Administered 2013-06-16 (×2): 8 mL

## 2013-06-16 MED ORDER — ONDANSETRON HCL 4 MG/2ML IJ SOLN
4.0000 mg | Freq: Four times a day (QID) | INTRAMUSCULAR | Status: DC | PRN
  Administered 2013-06-16: 4 mg via INTRAVENOUS
  Filled 2013-06-16: qty 2

## 2013-06-16 MED ORDER — LACTATED RINGERS IV SOLN: 500.0000 mL | INTRAVENOUS | Status: DC | PRN

## 2013-06-16 MED ORDER — SCOPOLAMINE 1 MG/3DAYS TD PT72
MEDICATED_PATCH | TRANSDERMAL | Status: AC
  Administered 2013-06-16: 1.5 mg via TRANSDERMAL
  Filled 2013-06-16: qty 1

## 2013-06-16 MED ORDER — SCOPOLAMINE 1 MG/3DAYS TD PT72: 1.0000 | MEDICATED_PATCH | Freq: Once | TRANSDERMAL | Status: DC

## 2013-06-16 MED ORDER — OXYTOCIN 40 UNITS IN LACTATED RINGERS INFUSION - SIMPLE MED
1.0000 m[IU]/min | INTRAVENOUS | Status: DC
  Administered 2013-06-16: 2 m[IU]/min via INTRAVENOUS

## 2013-06-16 MED ORDER — OXYTOCIN 10 UNIT/ML IJ SOLN
INTRAMUSCULAR | Status: AC
  Filled 2013-06-16: qty 4

## 2013-06-16 MED ORDER — ACETAMINOPHEN 325 MG PO TABS: 650.0000 mg | ORAL_TABLET | ORAL | Status: DC | PRN

## 2013-06-16 MED ORDER — SODIUM BICARBONATE 8.4 % IV SOLN
INTRAVENOUS | Status: AC
  Filled 2013-06-16: qty 50

## 2013-06-16 MED ORDER — ONDANSETRON HCL 4 MG/2ML IJ SOLN
INTRAMUSCULAR | Status: AC
  Filled 2013-06-16: qty 2

## 2013-06-16 MED ORDER — FENTANYL CITRATE 0.05 MG/ML IJ SOLN
INTRAMUSCULAR | Status: AC
  Filled 2013-06-16: qty 2

## 2013-06-16 MED ORDER — MORPHINE SULFATE (PF) 0.5 MG/ML IJ SOLN
INTRAMUSCULAR | Status: DC | PRN
  Administered 2013-06-16: 3 mg via EPIDURAL

## 2013-06-16 MED ORDER — LACTATED RINGERS IV SOLN
INTRAVENOUS | Status: DC | PRN
  Administered 2013-06-16: 13:00:00 via INTRAVENOUS

## 2013-06-16 MED ORDER — DIPHENHYDRAMINE HCL 50 MG/ML IJ SOLN: 25.0000 mg | INTRAMUSCULAR | Status: DC | PRN

## 2013-06-16 MED ORDER — LIDOCAINE-EPINEPHRINE (PF) 2 %-1:200000 IJ SOLN
INTRAMUSCULAR | Status: AC
  Filled 2013-06-16: qty 20

## 2013-06-16 MED ORDER — EPHEDRINE 5 MG/ML INJ: 10.0000 mg | INTRAVENOUS | Status: DC | PRN

## 2013-06-16 MED ORDER — OXYTOCIN 10 UNIT/ML IJ SOLN
40.0000 [IU] | INTRAVENOUS | Status: DC | PRN
  Administered 2013-06-16: 40 [IU] via INTRAVENOUS

## 2013-06-16 MED ORDER — ONDANSETRON HCL 4 MG PO TABS: 4.0000 mg | ORAL_TABLET | ORAL | Status: DC | PRN

## 2013-06-16 MED ORDER — IBUPROFEN 600 MG PO TABS: 600.0000 mg | ORAL_TABLET | Freq: Four times a day (QID) | ORAL | Status: DC | PRN

## 2013-06-16 MED ORDER — ZOLPIDEM TARTRATE 5 MG PO TABS: 5.0000 mg | ORAL_TABLET | Freq: Every evening | ORAL | Status: DC | PRN

## 2013-06-16 MED ORDER — NALBUPHINE SYRINGE 5 MG/0.5 ML: 5.0000 mg | INJECTION | INTRAMUSCULAR | Status: DC | PRN

## 2013-06-16 MED ORDER — OXYTOCIN 40 UNITS IN LACTATED RINGERS INFUSION - SIMPLE MED: 62.5000 mL/h | INTRAVENOUS | Status: AC

## 2013-06-16 MED ORDER — PHENYLEPHRINE 40 MCG/ML (10ML) SYRINGE FOR IV PUSH (FOR BLOOD PRESSURE SUPPORT)
PREFILLED_SYRINGE | INTRAVENOUS | Status: AC
  Filled 2013-06-16: qty 10

## 2013-06-16 MED ORDER — FENTANYL CITRATE 0.05 MG/ML IJ SOLN: 25.0000 ug | INTRAMUSCULAR | Status: DC | PRN

## 2013-06-16 MED ORDER — TERBUTALINE SULFATE 1 MG/ML IJ SOLN: 0.2500 mg | Freq: Once | INTRAMUSCULAR | Status: DC | PRN

## 2013-06-16 MED ORDER — NALOXONE HCL 0.4 MG/ML IJ SOLN: 0.4000 mg | INTRAMUSCULAR | Status: DC | PRN

## 2013-06-16 MED ORDER — KETOROLAC TROMETHAMINE 30 MG/ML IJ SOLN: 30.0000 mg | Freq: Four times a day (QID) | INTRAMUSCULAR | Status: AC | PRN

## 2013-06-16 MED ORDER — OXYCODONE-ACETAMINOPHEN 5-325 MG PO TABS
1.0000 | ORAL_TABLET | ORAL | Status: DC | PRN
  Administered 2013-06-18 – 2013-06-19 (×2): 1 via ORAL
  Filled 2013-06-16 (×3): qty 1

## 2013-06-16 MED ORDER — MEASLES, MUMPS & RUBELLA VAC ~~LOC~~ INJ
0.5000 mL | INJECTION | Freq: Once | SUBCUTANEOUS | Status: DC
  Filled 2013-06-16: qty 0.5

## 2013-06-16 MED ORDER — NALBUPHINE HCL 10 MG/ML IJ SOLN
5.0000 mg | INTRAMUSCULAR | Status: DC | PRN
  Administered 2013-06-16: 5 mg via INTRAVENOUS
  Filled 2013-06-16 (×2): qty 1

## 2013-06-16 MED ORDER — OXYTOCIN 40 UNITS IN LACTATED RINGERS INFUSION - SIMPLE MED
62.5000 mL/h | INTRAVENOUS | Status: DC
  Filled 2013-06-16: qty 1000

## 2013-06-16 MED ORDER — SODIUM CHLORIDE 0.9 % IJ SOLN: 3.0000 mL | INTRAMUSCULAR | Status: DC | PRN

## 2013-06-16 MED ORDER — SENNOSIDES-DOCUSATE SODIUM 8.6-50 MG PO TABS
2.0000 | ORAL_TABLET | Freq: Every day | ORAL | Status: DC
  Administered 2013-06-16 – 2013-06-18 (×3): 2 via ORAL

## 2013-06-16 MED ORDER — PRENATAL MULTIVITAMIN CH
1.0000 | ORAL_TABLET | Freq: Every day | ORAL | Status: DC
  Administered 2013-06-18 – 2013-06-19 (×2): 1 via ORAL
  Filled 2013-06-16 (×2): qty 1

## 2013-06-16 MED ORDER — ONDANSETRON HCL 4 MG/2ML IJ SOLN: 4.0000 mg | Freq: Three times a day (TID) | INTRAMUSCULAR | Status: DC | PRN

## 2013-06-16 MED ORDER — LACTATED RINGERS IV SOLN
INTRAVENOUS | Status: DC
  Administered 2013-06-16 (×2): via INTRAVENOUS

## 2013-06-16 MED ORDER — KETOROLAC TROMETHAMINE 60 MG/2ML IM SOLN: 60.0000 mg | Freq: Once | INTRAMUSCULAR | Status: AC | PRN

## 2013-06-16 MED ORDER — DIPHENHYDRAMINE HCL 25 MG PO CAPS
25.0000 mg | ORAL_CAPSULE | ORAL | Status: DC | PRN
  Filled 2013-06-16: qty 1

## 2013-06-16 MED ORDER — LACTATED RINGERS IV SOLN: 500.0000 mL | Freq: Once | INTRAVENOUS | Status: DC

## 2013-06-16 MED ORDER — DIBUCAINE 1 % RE OINT: 1.0000 "application " | TOPICAL_OINTMENT | RECTAL | Status: DC | PRN

## 2013-06-16 MED ORDER — KETOROLAC TROMETHAMINE 60 MG/2ML IM SOLN
INTRAMUSCULAR | Status: AC
  Administered 2013-06-16: 60 mg via INTRAMUSCULAR
  Filled 2013-06-16: qty 2

## 2013-06-16 MED ORDER — NALBUPHINE HCL 10 MG/ML IJ SOLN
5.0000 mg | INTRAMUSCULAR | Status: DC | PRN
  Filled 2013-06-16: qty 1

## 2013-06-16 MED ORDER — LANOLIN HYDROUS EX OINT: 1.0000 "application " | TOPICAL_OINTMENT | CUTANEOUS | Status: DC | PRN

## 2013-06-16 MED ORDER — IBUPROFEN 600 MG PO TABS
600.0000 mg | ORAL_TABLET | Freq: Four times a day (QID) | ORAL | Status: DC
  Administered 2013-06-17 – 2013-06-19 (×9): 600 mg via ORAL
  Filled 2013-06-16 (×9): qty 1

## 2013-06-16 MED ORDER — CITRIC ACID-SODIUM CITRATE 334-500 MG/5ML PO SOLN
30.0000 mL | ORAL | Status: DC | PRN
  Administered 2013-06-16: 30 mL via ORAL
  Filled 2013-06-16 (×2): qty 15

## 2013-06-16 MED ORDER — METOCLOPRAMIDE HCL 5 MG/ML IJ SOLN: 10.0000 mg | Freq: Three times a day (TID) | INTRAMUSCULAR | Status: DC | PRN

## 2013-06-16 MED ORDER — MENTHOL 3 MG MT LOZG: 1.0000 | LOZENGE | OROMUCOSAL | Status: DC | PRN

## 2013-06-16 MED ORDER — CEFAZOLIN SODIUM-DEXTROSE 2-3 GM-% IV SOLR
INTRAVENOUS | Status: AC
  Filled 2013-06-16: qty 50

## 2013-06-16 MED ORDER — WITCH HAZEL-GLYCERIN EX PADS: 1.0000 "application " | MEDICATED_PAD | CUTANEOUS | Status: DC | PRN

## 2013-06-16 MED ORDER — SIMETHICONE 80 MG PO CHEW
80.0000 mg | CHEWABLE_TABLET | ORAL | Status: DC | PRN
  Administered 2013-06-16: 80 mg via ORAL

## 2013-06-16 MED ORDER — OXYTOCIN BOLUS FROM INFUSION: 500.0000 mL | INTRAVENOUS | Status: DC

## 2013-06-16 MED ORDER — FENTANYL 2.5 MCG/ML BUPIVACAINE 1/10 % EPIDURAL INFUSION (WH - ANES)
14.0000 mL/h | INTRAMUSCULAR | Status: DC | PRN
  Filled 2013-06-16: qty 125

## 2013-06-16 MED ORDER — TETANUS-DIPHTH-ACELL PERTUSSIS 5-2.5-18.5 LF-MCG/0.5 IM SUSP: 0.5000 mL | Freq: Once | INTRAMUSCULAR | Status: DC

## 2013-06-16 MED ORDER — FENTANYL CITRATE 0.05 MG/ML IJ SOLN
INTRAMUSCULAR | Status: DC | PRN
  Administered 2013-06-16 (×2): 50 ug via INTRAVENOUS

## 2013-06-16 MED ORDER — ONDANSETRON HCL 4 MG/2ML IJ SOLN: 4.0000 mg | INTRAMUSCULAR | Status: DC | PRN

## 2013-06-16 MED ORDER — NALBUPHINE HCL 10 MG/ML IJ SOLN
10.0000 mg | Freq: Once | INTRAMUSCULAR | Status: DC
  Filled 2013-06-16: qty 1

## 2013-06-16 MED ORDER — DIPHENHYDRAMINE HCL 25 MG PO CAPS: 25.0000 mg | ORAL_CAPSULE | Freq: Four times a day (QID) | ORAL | Status: DC | PRN

## 2013-06-16 MED ORDER — BUPIVACAINE HCL (PF) 0.25 % IJ SOLN
INTRAMUSCULAR | Status: AC
  Filled 2013-06-16: qty 30

## 2013-06-16 MED ORDER — FENTANYL 2.5 MCG/ML BUPIVACAINE 1/10 % EPIDURAL INFUSION (WH - ANES)
INTRAMUSCULAR | Status: DC | PRN
  Administered 2013-06-16: 14 mL/h via EPIDURAL

## 2013-06-16 MED ORDER — DEXTROSE IN LACTATED RINGERS 5 % IV SOLN
INTRAVENOUS | Status: DC
  Administered 2013-06-17: 09:00:00 via INTRAVENOUS

## 2013-06-16 SURGICAL SUPPLY — 35 items
APL SKNCLS STERI-STRIP NONHPOA (GAUZE/BANDAGES/DRESSINGS) ×1
BENZOIN TINCTURE PRP APPL 2/3 (GAUZE/BANDAGES/DRESSINGS) ×1 IMPLANT
CLAMP CORD UMBIL (MISCELLANEOUS) IMPLANT
CLOTH BEACON ORANGE TIMEOUT ST (SAFETY) ×2 IMPLANT
DRAPE LG THREE QUARTER DISP (DRAPES) ×2 IMPLANT
DRSG OPSITE POSTOP 4X10 (GAUZE/BANDAGES/DRESSINGS) ×2 IMPLANT
DURAPREP 26ML APPLICATOR (WOUND CARE) ×2 IMPLANT
ELECT REM PT RETURN 9FT ADLT (ELECTROSURGICAL) ×2
ELECTRODE REM PT RTRN 9FT ADLT (ELECTROSURGICAL) ×1 IMPLANT
EXTRACTOR VACUUM M CUP 4 TUBE (SUCTIONS) IMPLANT
GLOVE BIOGEL PI IND STRL 7.0 (GLOVE) ×1 IMPLANT
GLOVE BIOGEL PI INDICATOR 7.0 (GLOVE) ×1
GLOVE ECLIPSE 7.0 STRL STRAW (GLOVE) ×4 IMPLANT
GLOVE SURG SS PI 7.0 STRL IVOR (GLOVE) ×1 IMPLANT
GOWN PREVENTION PLUS XLARGE (GOWN DISPOSABLE) ×2 IMPLANT
GOWN STRL REIN XL XLG (GOWN DISPOSABLE) ×4 IMPLANT
KIT ABG SYR 3ML LUER SLIP (SYRINGE) IMPLANT
NDL HYPO 25X5/8 SAFETYGLIDE (NEEDLE) IMPLANT
NEEDLE HYPO 22GX1.5 SAFETY (NEEDLE) ×2 IMPLANT
NEEDLE HYPO 25X5/8 SAFETYGLIDE (NEEDLE) IMPLANT
NS IRRIG 1000ML POUR BTL (IV SOLUTION) ×2 IMPLANT
PACK C SECTION WH (CUSTOM PROCEDURE TRAY) ×2 IMPLANT
PAD ABD 7.5X8 STRL (GAUZE/BANDAGES/DRESSINGS) ×1 IMPLANT
PAD OB MATERNITY 4.3X12.25 (PERSONAL CARE ITEMS) ×2 IMPLANT
RTRCTR C-SECT PINK 25CM LRG (MISCELLANEOUS) ×1 IMPLANT
STAPLER VISISTAT 35W (STAPLE) IMPLANT
STRIP CLOSURE SKIN 1/2X4 (GAUZE/BANDAGES/DRESSINGS) ×1 IMPLANT
SUT VIC AB 0 CTX 36 (SUTURE) ×6
SUT VIC AB 0 CTX36XBRD ANBCTRL (SUTURE) ×3 IMPLANT
SUT VIC AB 4-0 KS 27 (SUTURE) ×1 IMPLANT
SYR 30ML LL (SYRINGE) ×2 IMPLANT
TAPE HYPAFIX 6X30 (GAUZE/BANDAGES/DRESSINGS) ×1 IMPLANT
TOWEL OR 17X24 6PK STRL BLUE (TOWEL DISPOSABLE) ×6 IMPLANT
TRAY FOLEY CATH 14FR (SET/KITS/TRAYS/PACK) ×2 IMPLANT
WATER STERILE IRR 1000ML POUR (IV SOLUTION) ×2 IMPLANT

## 2013-06-16 NOTE — H&P (Signed)
Katherine Lindsey is a 27 y.o. female presenting for labor evaluation.  Maternal Medical History:  Reason for admission: Contractions.  Nausea.  Contractions: Onset was 3-5 hours ago.   Frequency: regular.   Perceived severity is moderate.    Fetal activity: Perceived fetal activity is normal.   Last perceived fetal movement was within the past hour.      OB History   Grav Para Term Preterm Abortions TAB SAB Ect Mult Living   3 1 1  1 1  0   1     Past Medical History  Diagnosis Date  . Gallstones    Past Surgical History  Procedure Laterality Date  . Induced abortion  02/2012  . Cholecystectomy  08/28/2012    Procedure: LAPAROSCOPIC CHOLECYSTECTOMY WITH INTRAOPERATIVE CHOLANGIOGRAM;  Surgeon: [name], DO;  Location: [facility] OR;  Service: General;  Laterality: N/A;   Family History: family history is negative for Other, and Diabetes, and Heart disease, and Hyperlipidemia, and Hypertension, and Stroke, . Social History:  reports that she has never smoked. She has never used smokeless tobacco. She reports that she does not drink alcohol or use illicit drugs.   Prenatal Transfer Tool  Maternal Diabetes: No Genetic Screening: Normal Maternal Ultrasounds/Referrals: Normal Fetal Ultrasounds or other Referrals:  None Maternal Substance Abuse:  No Significant Maternal Medications:  None Significant Maternal Lab Results:  None Other Comments:  None  Review of Systems  Constitutional: Negative for fever.  Eyes: Negative for blurred vision.  Cardiovascular: Negative for chest pain.  Gastrointestinal: Positive for abdominal pain (contractions). Negative for nausea, vomiting, diarrhea and constipation.  Genitourinary: Negative for dysuria, urgency and frequency.  Neurological: Negative for headaches.    Dilation: 4 Effacement (%): 70 Station: -1 Exam by:: L. Munford RN Blood pressure 124/68, pulse 69, temperature 98 F (36.7 C), temperature source Oral, resp. rate 18,  height 5\' 8"  (1.727 m), weight 79.606 kg (175 lb 8 oz), last menstrual period 09/12/2012. Maternal Exam:  Uterine Assessment: Contraction strength is moderate.  Contraction duration is 60 seconds. Abdomen: Patient reports no abdominal tenderness. Introitus: Normal vulva. Ferning test: not done.  Nitrazine test: not done.  Pelvis: adequate for delivery.   Cervix: Cervix evaluated by digital exam.     Fetal Exam Fetal Monitor Review: Mode: ultrasound.   Baseline rate: 145.  Variability: moderate (6-25 bpm).   Pattern: accelerations present and no decelerations.    Fetal State Assessment: Category I - tracings are normal.     Physical Exam  Nursing note and vitals reviewed. Constitutional: She is oriented to person, place, and time. She appears well-developed and well-nourished. No distress.  Cardiovascular: Normal rate.   Respiratory: Effort normal.  GI: Soft.  Neurological: She is alert and oriented to person, place, and time.  Skin: Skin is warm and dry.  Psychiatric: She has a normal mood and affect.    Prenatal labs: ABO, Rh: A/POS/-- (01/06 1101) Antibody: NEG (01/06 1101) Rubella: 6.66 (01/06 1101) RPR: NON REAC (04/23 1352)  HBsAg: NEGATIVE (01/06 1101)  HIV: NON REACTIVE (04/23 1352)  GBS:   Negative   Assessment/Plan: Active labor at term, reassuring M-F status  Admit to labor and delivery  Routine orders Epidural when desired Anticipate NSVB   Tawnya Crook 06/16/2013, 2:00 AM

## 2013-06-16 NOTE — Anesthesia Postprocedure Evaluation (Signed)
  Anesthesia Post-op Note  Anesthesia Post Note  Patient: Katherine Lindsey  Procedure(s) Performed: Procedure(s) (LRB): CESAREAN SECTION (N/A)  Anesthesia type: Epidural  Patient location: PACU  Post pain: Pain level controlled  Post assessment: Post-op Vital signs reviewed  Last Vitals:  Filed Vitals:   06/16/13 1430  BP: 112/57  Pulse: 65  Temp:   Resp: 16    Post vital signs: stable  Level of consciousness: awake  Complications: No apparent anesthesia complications

## 2013-06-16 NOTE — Anesthesia Preprocedure Evaluation (Signed)
Anesthesia Evaluation  Patient identified by MRN, date of birth, ID band Patient awake    Reviewed: Allergy & Precautions, H&P , NPO status , Patient's Chart, lab work & pertinent test results  Airway Mallampati: I TM Distance: >3 FB Neck ROM: full    Dental no notable dental hx. (+) Teeth Intact   Pulmonary neg pulmonary ROS,    Pulmonary exam normal       Cardiovascular negative cardio ROS      Neuro/Psych negative neurological ROS  negative psych ROS   GI/Hepatic negative GI ROS, Neg liver ROS,   Endo/Other  negative endocrine ROS  Renal/GU negative Renal ROS  negative genitourinary   Musculoskeletal negative musculoskeletal ROS (+)   Abdominal Normal abdominal exam  (+)   Peds negative pediatric ROS (+)  Hematology negative hematology ROS (+)   Anesthesia Other Findings   Reproductive/Obstetrics negative OB ROS (+) Pregnancy                           Anesthesia Physical Anesthesia Plan  ASA: II  Anesthesia Plan: Epidural   Post-op Pain Management:    Induction:   Airway Management Planned:   Additional Equipment:   Intra-op Plan:   Post-operative Plan:   Informed Consent: I have reviewed the patients History and Physical, chart, labs and discussed the procedure including the risks, benefits and alternatives for the proposed anesthesia with the patient or authorized representative who has indicated his/her understanding and acceptance.     Plan Discussed with:   Anesthesia Plan Comments:         Anesthesia Quick Evaluation  

## 2013-06-16 NOTE — H&P (Signed)
Attestation of Attending Supervision of Advanced Practitioner (CNM/NP): Evaluation and management procedures were performed by the Advanced Practitioner under my supervision and collaboration.  I have reviewed the Advanced Practitioner's note and chart, and I agree with the management and plan.  Damico Partin 06/16/2013 8:00 AM   

## 2013-06-16 NOTE — Progress Notes (Signed)
SVE, large bulging BOW, no leaking at this time.

## 2013-06-16 NOTE — MAU Note (Signed)
PT SAYS SHE STARTED HURTING ABD AT 2200.     WAS HERE LAST NIGHT - VE - CLOSED.   DNIES HSV AND MRSA

## 2013-06-16 NOTE — Anesthesia Procedure Notes (Signed)
Epidural Patient location during procedure: OB Start time: 06/16/2013 3:29 AM End time: 06/16/2013 3:35 AM  Staffing Anesthesiologist: Sandrea Hughs Performed by: anesthesiologist   Preanesthetic Checklist Completed: patient identified, surgical consent, pre-op evaluation, timeout performed, IV checked, risks and benefits discussed and monitors and equipment checked  Epidural Patient position: sitting Prep: site prepped and draped and DuraPrep Patient monitoring: continuous pulse ox and blood pressure Approach: midline Injection technique: LOR air  Needle:  Needle type: Tuohy  Needle gauge: 17 G Needle length: 9 cm and 9 Needle insertion depth: 7 cm Catheter type: closed end flexible Catheter size: 19 Gauge Catheter at skin depth: 12 cm Test dose: negative and Other  Assessment Sensory level: T9 Events: blood not aspirated, injection not painful, no injection resistance, negative IV test and no paresthesia  Additional Notes Reason for block:procedure for pain

## 2013-06-16 NOTE — Transfer of Care (Signed)
Immediate Anesthesia Transfer of Care Note  Patient: Katherine Lindsey  Procedure(s) Performed: Procedure(s): CESAREAN SECTION (N/A)  Patient Location: PACU  Anesthesia Type:Epidural  Level of Consciousness: awake, alert  and oriented  Airway & Oxygen Therapy: Patient Spontanous Breathing  Post-op Assessment: Report given to PACU RN and Post -op Vital signs reviewed and stable  Post vital signs: stable  Complications: No apparent anesthesia complications

## 2013-06-16 NOTE — Interval H&P Note (Signed)
History and Physical Interval Note:  06/16/2013 1:04 PM  Katherine Lindsey  has presented today for surgery, with the diagnosis of Arrest of descent failed VAVD and patient request. The various methods of treatment have been discussed with the patient and family. After consideration of risks, benefits and other options for treatment, the patient has consented to  Procedure(s): CESAREAN SECTION (N/A) as a surgical intervention .  The patient's history has been reviewed, patient examined, no change in status, stable for surgery.  I have reviewed the patient's chart and labs.  Questions were answered to the patient's satisfaction.     Onalee Steinbach S

## 2013-06-16 NOTE — Op Note (Signed)
Preoperative Diagnosis:  IUP @ [redacted]w[redacted]d, Arrest of Descent, Failed Vacuum, Pt. Request.  Postoperative Diagnosis:  Same  Procedure: Primary low transverse cesarean section  Surgeon: Tinnie Gens, M.D.  Assistant: None  Findings: Viable female infant, APGAR (1 MIN): 7   APGAR (5 MINS): 8   vtx presentation, ROA position, extension of hysterotomy bilaterally  Estimated blood loss: 1200 cc  Complications: None known  Specimens: Placenta to labor and delivery  Reason for procedure: Briefly, the patient is a 27 y.o. [id number] [redacted]w[redacted]d who presents for active labor.  Became complete and labored down for several hours.  Began pushing and pushed x 2 hours.  LOP position.  Kiwi placed and pulled with 3 contractions with no descent. Vacuum removed.  Head rotated to ROA manually.  Pt.'s epidural had been turned off and she refused further attempts at vaginal deliver.  Procedure: Patient is a to the OR where spinal analgesia was administered. She was then placed in a supine position with left lateral tilt. She received 2 g of Ancef and SCDs were in place. A timeout was performed. She was prepped and draped in the usual sterile fashion. A Foley catheter was present in the bladder. A knife was then used to make a Pfannenstiel incision. This incision was carried out to underlying fascia which was divided in the midline with the knife. The incision was extended laterally, sharply.  The rectus was divided in the midline.  The peritoneal cavity was entered bluntly.  Alexis retractor was placed inside the incision.  A knife was used to make a low transverse incision on the uterus. This incision was carried down to the amniotic cavity was entered. Fetus was in ROA position and was brought up out of the incision without difficulty. Cord was clamped x 2 and cut. Infant taken to waiting pediatrician.  Cord blood was obtained. Placenta was delivered from the uterus.  Uterus was cleaned with dry lap pads. Uterine incision  closed with 0 Vicryl suture in a locked running fashion. A second layer of 0 Vicryl in an imbricating fashion was used to achieve hemostasis.  Extension of the uterine incision bilaterally inferiorly was noted and closed.  Further bleeding noted at right corner and a Figure of eight placed here in the broad ligament superficially.  Alexis retractor was removed from the abdomen. Peritoneal closure was done with 0 Vicryl suture.  Fascia is closed with 0 Vicryl suture in a running fashion. Subcutaneous tissue infused with 30cc 0.25% Marcaine. Skin closed using 3-0 Vicryl on a Keith needle.  Steri strips applied, followed by pressure dressing.  All instrument, needle and lap counts were correct x 2.  Patient was awake and taken to PACU stable.  Infant to Newborn Nursery, stable.   PRATT,TANYA SMD 06/16/2013 2:04 PM

## 2013-06-16 NOTE — Progress Notes (Signed)
Patient ID: Katherine Lindsey, female   DOB: July 31, 1986, 27 y.o.   MRN: [id number] Pt. Complete and pushing x 2 hours.  Her epidural was off.  She asked for VAVD which she had previously.  Bladder drained with RR catheter.  Infant at +3 LOP.  Kiwi Vacuum applied and pulled x 3 contractions with very little descent.  Vacuum removed and attempt at manual rotation done and then pt. Stopped pushing.  Stated that she could not do this nad she demanded a C-section.  Epidural turned back on for several contractions and pt. Continued to refuse further attempts at vaginal delivery.  Having pushing variables but FHR recovered during periods of no contractions.  Written and verbal informed consent for abdominal delivery done.

## 2013-06-17 ENCOUNTER — Encounter (HOSPITAL_COMMUNITY): Payer: Self-pay

## 2013-06-17 ENCOUNTER — Encounter (HOSPITAL_COMMUNITY): Payer: Self-pay | Admitting: Family Medicine

## 2013-06-17 ENCOUNTER — Encounter: Payer: Medicaid Other | Admitting: Advanced Practice Midwife

## 2013-06-17 LAB — CBC
Hemoglobin: 8.2 g/dL — ABNORMAL LOW (ref 12.0–15.0)
RBC: 2.76 MIL/uL — ABNORMAL LOW (ref 3.87–5.11)

## 2013-06-17 NOTE — Anesthesia Postprocedure Evaluation (Signed)
Anesthesia Post Note  Patient: Katherine Lindsey  Procedure(s) Performed: * No procedures listed *  Anesthesia type: Epidural  Patient location: Mother/Baby  Post pain: Pain level controlled  Post assessment: Post-op Vital signs reviewed  Last Vitals:  Filed Vitals:   06/17/13 0559  BP: 114/54  Pulse: 78  Temp: 37.1 C  Resp: 18    Post vital signs: Reviewed  Level of consciousness: awake  Complications: No apparent anesthesia complications Anesthesia Post-op Note  Patient: Katherine Lindsey  Procedure(s) Performed: * No procedures listed *  Patient Location: PACU and Mother/Baby  Anesthesia Type:Epidural  Level of Consciousness: awake, alert  and oriented  Airway and Oxygen Therapy: Patient Spontanous Breathing  Post-op Pain: none  Post-op Assessment: Post-op Vital signs reviewed  Post-op Vital Signs: Reviewed and stable  Complications: No apparent anesthesia complications

## 2013-06-17 NOTE — Progress Notes (Signed)
I have seen and examined this patient and I agree with the above. Cam Hai 9:19 AM 06/17/2013

## 2013-06-17 NOTE — Progress Notes (Addendum)
Subjective: Postpartum Day 1: Cesarean Delivery Patient reports nausea, vomiting and + flatus.  Vomited last night. Has been drinking fluids today. Out of bed last night. Pain well controlled. Denies dizziness or s/s anemia.  Objective: Vital signs in last 24 hours: Temp:  [97.3 F (36.3 C)-98.7 F (37.1 C)] 98.7 F (37.1 C) (07/02 0559) Pulse Rate:  [61-90] 78 (07/02 0559) Resp:  [15-18] 18 (07/02 0559) BP: (105-141)/(52-82) 114/54 mmHg (07/02 0559) SpO2:  [99 %-100 %] 100 % (07/02 0559)  Physical Exam:  General: alert and no distress Heart: Normal S1/S2, regular rate and rhythm. Lung: Clear to auscultation bilaterally Lochia: appropriate Uterine Fundus: firm Abdomen: Positive bowel sounds. Some tenderness to palpation. Incision: healing well, no significant drainage. Minimal dried blood on dressing. DVT Evaluation: No significant calf/ankle edema.   Recent Labs  06/16/13 0230 06/17/13 0545  HGB 12.4 8.2*  HCT 35.4* 23.8*    Assessment/Plan: Status post Cesarean section. Doing well postoperatively.  Continue current care.  Planning on bottle/breastfeeding. Depo for birth control  Tawni Carnes 06/17/2013, 7:26 AM

## 2013-06-17 NOTE — Progress Notes (Signed)
UR chart review completed.  

## 2013-06-18 NOTE — Progress Notes (Signed)
Subjective: Postpartum Day 2: Cesarean Delivery Patient reports tolerating PO and + flatus.  Has not had  BM yet. She is not dizzy/lightheaded when out of bed. Reports no pain but did have a few abdominal cramps. No bleeding. Has tried breastfeeding yesterday and will try again today.   Objective: Vital signs in last 24 hours: Temp:  [98 F (36.7 C)-98.6 F (37 C)] 98 F (36.7 C) (07/03 0540) Pulse Rate:  [62-90] 62 (07/03 0540) Resp:  [17-18] 17 (07/03 0540) BP: (102-114)/(58-68) 111/68 mmHg (07/03 0540) SpO2:  [100 %] 100 % (07/02 1335)  Physical Exam:  General: cooperative, no distress and sleeping when first arrived Heart: normal S1/S2, regular rate and rhythm Abdomen: bowel sounds present, non-tender to palpation Lochia: appropriate Uterine Fundus: firm Incision: healing well, no significant drainage, no significant erythema DVT Evaluation: No evidence of DVT seen on physical exam.   Recent Labs  06/16/13 0230 06/17/13 0545  HGB 12.4 8.2*  HCT 35.4* 23.8*    Assessment/Plan: Status post Cesarean section. Doing well postoperatively.  Continue current care. Plan for discharge tomorrow. Depo for birth control  Tawni Carnes 06/18/2013, 7:35 AM  I have seen this patient and agree with the above resident's note.  LEFTWICH-KIRBY, Sebasthian Stailey Certified Nurse-Midwife

## 2013-06-19 MED ORDER — FERROUS SULFATE 325 (65 FE) MG PO TABS: 325.0000 mg | ORAL_TABLET | Freq: Every day | ORAL | Status: DC

## 2013-06-19 MED ORDER — OXYCODONE-ACETAMINOPHEN 5-325 MG PO TABS: 1.0000 | ORAL_TABLET | ORAL | Status: DC | PRN

## 2013-06-19 MED ORDER — IBUPROFEN 600 MG PO TABS: 600.0000 mg | ORAL_TABLET | Freq: Four times a day (QID) | ORAL | Status: DC

## 2013-06-19 NOTE — Discharge Summary (Signed)
Attestation of Attending Supervision of Advanced Practitioner (CNM/NP): Evaluation and management procedures were performed by the Advanced Practitioner under my supervision and collaboration. I have reviewed the Advanced Practitioner's note and chart, and I agree with the management and plan.  [name]. 10:29 AM   

## 2013-06-19 NOTE — Discharge Summary (Signed)
Obstetric Discharge Summary Reason for Admission: onset of labor Prenatal Procedures: none Intrapartum Procedures: cesarean: low cervical, transverse after failure to descend with vacuum extraction attempted Postpartum Procedures: none Complications-Operative and Postpartum: none  Hemoglobin  Date Value Range Status  06/17/2013 8.2* 12.0 - 15.0 g/dL Final     DELTA CHECK NOTED     REPEATED TO VERIFY     HCT  Date Value Range Status  06/17/2013 23.8* 36.0 - 46.0 % Final    Physical Exam:  General: alert, cooperative, appears stated age and no distress Lochia: appropriate Uterine Fundus: firm Incision: healing well, no significant drainage, no dehiscence, no significant erythema DVT Evaluation: No evidence of DVT seen on physical exam. No cords or calf tenderness. No significant calf/ankle edema. CV: RRR PULM: CTAB, no weheezes or crackles  Discharge Diagnoses: Term Pregnancy-delivered  Discharge Information: Date: 06/19/2013 Activity: pelvic rest Diet: routine Medications: Percocet Condition: stable Instructions: refer to practice specific booklet Discharge to: home  Status post Cesarean section. Doing well postoperatively.  Depo for birth control Breast/bottle feeding Low hemoglobin after delivery - Hgb 8.2, asymptomatic. Will start Fe 325mg  daily and can f/u as outpatient.  Newborn Data: Live born female  Birth Weight: 6 lb 12.1 oz (3065 g) APGAR: 7, 8  Home with mother.  Katherine Lindsey 06/19/2013, 7:51 AM  I saw and examined patient and agree with above resident note. I reviewed history, delivery summary, labs and vitals. Napoleon Form, MD

## 2013-06-21 ENCOUNTER — Emergency Department (HOSPITAL_COMMUNITY)
Admission: EM | Admit: 2013-06-21 | Discharge: 2013-06-21 | Disposition: A | Discharge status: Home / Self Care | Payer: Medicaid Other | Attending: Emergency Medicine | Admitting: Emergency Medicine

## 2013-06-21 ENCOUNTER — Encounter (HOSPITAL_COMMUNITY): Payer: Self-pay | Admitting: Nurse Practitioner

## 2013-06-21 DIAGNOSIS — Z8719 Personal history of other diseases of the digestive system: Secondary | ICD-10-CM | POA: Insufficient documentation

## 2013-06-21 DIAGNOSIS — Z9089 Acquired absence of other organs: Secondary | ICD-10-CM | POA: Insufficient documentation

## 2013-06-21 DIAGNOSIS — T40605A Adverse effect of unspecified narcotics, initial encounter: Secondary | ICD-10-CM | POA: Insufficient documentation

## 2013-06-21 DIAGNOSIS — K5900 Constipation, unspecified: Secondary | ICD-10-CM | POA: Insufficient documentation

## 2013-06-21 DIAGNOSIS — R1084 Generalized abdominal pain: Secondary | ICD-10-CM | POA: Insufficient documentation

## 2013-06-21 DIAGNOSIS — Z79899 Other long term (current) drug therapy: Secondary | ICD-10-CM | POA: Insufficient documentation

## 2013-06-21 DIAGNOSIS — T394X5A Adverse effect of antirheumatics, not elsewhere classified, initial encounter: Secondary | ICD-10-CM | POA: Insufficient documentation

## 2013-06-21 DIAGNOSIS — Z9889 Other specified postprocedural states: Secondary | ICD-10-CM | POA: Insufficient documentation

## 2013-06-21 MED ORDER — GLYCERIN (LAXATIVE) 2 G RE SUPP: 1.0000 | Freq: Once | RECTAL | Status: DC | PRN

## 2013-06-21 NOTE — ED Notes (Signed)
Pt to restroom- asked to pull call bell in bathroom before getting up due to burning around incision site and pt walked a little humped over.

## 2013-06-21 NOTE — ED Notes (Signed)
PA and RN at bedside disimpacting patient.

## 2013-06-21 NOTE — ED Provider Notes (Signed)
Medical screening examination/treatment/procedure(s) were performed by non-physician practitioner and as supervising physician I was immediately available for consultation/collaboration.  Anarely Nicholls, MD 06/21/13 1655 

## 2013-06-21 NOTE — ED Notes (Addendum)
Per pt;  Had a c-section on Tuesday and has not had a bowel movement since then.   Pt has been taking laxatives.

## 2013-06-21 NOTE — ED Provider Notes (Signed)
History    CSN: [id number] Arrival date & time 06/21/13  1051  First MD Initiated Contact with Patient 06/21/13 1108     Chief Complaint  Patient presents with  . Constipation   (Consider location/radiation/quality/duration/timing/severity/associated sxs/prior Treatment) HPI Comments: Patient with history of cesarean section 5 days ago presents with complaint of constipation. She has not had a bowel movement in 6 days. Patient is complaining of generalized abdominal pain. She's been taking MiraLax only without relief of symptoms. She's currently on Percocet and ibuprofen for pain. She states it feels like she has stool in her rectum that won't come out. She is afraid to bear down because of her surgical incision. Patient denies fever or vomiting. Onset of symptoms gradual. Course is constant. Nothing makes symptoms better or worse.  Patient is a 27 y.o. female presenting with constipation. The history is provided by the patient.  Constipation Associated symptoms: abdominal pain   Associated symptoms: no diarrhea, no dysuria, no fever, no nausea and no vomiting    Past Medical History  Diagnosis Date  . Gallstones    Past Surgical History  Procedure Laterality Date  . Induced abortion  02/2012  . Cholecystectomy  08/28/2012    Procedure: LAPAROSCOPIC CHOLECYSTECTOMY WITH INTRAOPERATIVE CHOLANGIOGRAM;  Surgeon: [name], DO;  Location: [facility];  Service: General;  Laterality: N/A;  . Cesarean section N/A 06/16/2013    Procedure: CESAREAN SECTION;  Surgeon: Reva Bores, MD;  Location: [facility];  Service: Obstetrics;  Laterality: N/A;   Family History  Problem Relation Age of Onset  . Other Neg Hx   . Diabetes Neg Hx   . Heart disease Neg Hx   . Hyperlipidemia Neg Hx   . Hypertension Neg Hx   . Stroke Neg Hx    History  Substance Use Topics  . Smoking status: Never Smoker   . Smokeless tobacco: Never Used  . Alcohol Use: No   OB History   Grav Para Term Preterm Abortions TAB  SAB Ect Mult Living   3 2 2  1 1  0   2     Review of Systems  Constitutional: Negative for fever.  HENT: Negative for sore throat and rhinorrhea.   Eyes: Negative for redness.  Respiratory: Negative for cough.   Cardiovascular: Negative for chest pain.  Gastrointestinal: Positive for abdominal pain and constipation. Negative for nausea, vomiting and diarrhea.  Genitourinary: Negative for dysuria.  Musculoskeletal: Negative for myalgias.  Skin: Negative for rash.  Neurological: Negative for headaches.    Allergies  Review of patient's allergies indicates no known allergies.  Home Medications   Current Outpatient Rx  Name  Route  Sig  Dispense  Refill  . acetaminophen (TYLENOL) 500 MG tablet   Oral   Take 1,000 mg by mouth every 6 (six) hours as needed for pain.         Marland Kitchen docusate sodium (COLACE) 100 MG capsule   Oral   Take 1 capsule (100 mg total) by mouth 2 (two) times daily.   60 capsule   0   . hydrocortisone (ANUSOL-HC) 2.5 % rectal cream   Rectal   Place rectally 2 (two) times daily.   30 g   0   . ibuprofen (ADVIL,MOTRIN) 600 MG tablet   Oral   Take 1 tablet (600 mg total) by mouth every 6 (six) hours.   30 tablet   0   . oxyCODONE-acetaminophen (PERCOCET/ROXICET) 5-325 MG per tablet   Oral  Take 1-2 tablets by mouth every 4 (four) hours as needed.   15 tablet   0   . polyethylene glycol powder (GLYCOLAX/MIRALAX) powder   Oral   Take 17 g by mouth daily. Increase in increments of 17 g to achieve a normal, soft bowel daily bowel movement.   255 g   0   . scopolamine (TRANSDERM-SCOP) 1.5 MG   Transdermal   Place 1 patch onto the skin every 3 (three) days.         . ferrous sulfate 325 (65 FE) MG tablet   Oral   Take 1 tablet (325 mg total) by mouth daily with breakfast.   30 tablet   3    BP 113/54  Pulse 72  Temp(Src) 98 F (36.7 C) (Oral)  Resp 16  Ht 5\' 8"  (1.727 m)  SpO2 99% Physical Exam  Nursing note and vitals  reviewed. Constitutional: She appears well-developed and well-nourished.  HENT:  Head: Normocephalic and atraumatic.  Eyes: Conjunctivae are normal. Right eye exhibits no discharge. Left eye exhibits no discharge.  Neck: Normal range of motion. Neck supple.  Cardiovascular: Normal rate, regular rhythm and normal heart sounds.   Pulmonary/Chest: Effort normal and breath sounds normal.  Abdominal: Soft. Bowel sounds are normal. She exhibits no distension. There is tenderness (lower abd, dressing in place over c-section wounds. Healed laporotomy scars on abdominal wall. ). There is no rebound and no guarding.  Neurological: She is alert.  Skin: Skin is warm and dry.  Psychiatric: She has a normal mood and affect.    ED Course  Procedures (including critical care time) Labs Reviewed - No data to display No results found. 1. Constipation due to pain medication, initial encounter     11:39 AM Patient seen and examined. Will perform disimpaction.   Vital signs reviewed and are as follows: Filed Vitals:   06/21/13 1101  BP: 113/54  Pulse: 72  Temp: 98 F (36.7 C)  Resp: 16   12:30 PM Disimpaction performed by PA student under my direct supervision with Ardine Eng at bedside. Slightly hard stool in rectal vault.   Patient given rx for glycerin suppository, encouraged to use at home enema, continue miralax and good hydration.   Encouraged to keep post-op f/u and decrease narcotics use to lowest level needed to control pain.    MDM  Patient with constipation after c-section. Will maximize medical therapy. Disimpaction performed. No masses or obstructions. Patient does not have obstructive symptoms.   Renne Crigler, PA-C 06/21/13 [phone]

## 2013-06-22 ENCOUNTER — Telehealth: Payer: Self-pay | Admitting: *Deleted

## 2013-06-22 NOTE — Telephone Encounter (Signed)
Patient called in and asked to speak to a nurse. I talked with her about her incision. She was inquiring on how to care for the incision, she still had on the dressing and the home care instructions she got was for vaginal delivery. I informed her that she can remove the bandage and wash the area with water. Patient states understanding. She also said that the doctor told her that someone would call her to make a depo appt. i advised her that I could help her with this. Pt scheduled for Wednesday at 2 pm for the depo provera injection. Pt stated that she has no further questions.

## 2013-06-24 ENCOUNTER — Ambulatory Visit (INDEPENDENT_AMBULATORY_CARE_PROVIDER_SITE_OTHER): Payer: Medicaid Other | Admitting: Advanced Practice Midwife

## 2013-06-24 ENCOUNTER — Encounter: Payer: Self-pay | Admitting: Advanced Practice Midwife

## 2013-06-24 VITALS — BP 122/75 | HR 70 | Temp 97.9°F | Ht 68.0 in | Wt 156.9 lb

## 2013-06-24 DIAGNOSIS — Z09 Encounter for follow-up examination after completed treatment for conditions other than malignant neoplasm: Secondary | ICD-10-CM

## 2013-06-24 DIAGNOSIS — Z98891 History of uterine scar from previous surgery: Secondary | ICD-10-CM

## 2013-06-24 DIAGNOSIS — Z3049 Encounter for surveillance of other contraceptives: Secondary | ICD-10-CM

## 2013-06-24 MED ORDER — MEDROXYPROGESTERONE ACETATE 150 MG/ML IM SUSP
150.0000 mg | Freq: Once | INTRAMUSCULAR | Status: AC
  Administered 2013-06-24: 150 mg via INTRAMUSCULAR

## 2013-06-24 NOTE — Progress Notes (Signed)
Subjective:     Patient ID: Katherine Lindsey, female   DOB: 1986-06-18, 27 y.o.   MRN: [id number]  HPI  Katherine Lindsey is a 27 y.o. [id number] who is here for a depo injection, and for incision check.  Review of Systems     Objective:   Physical Exam Abdomen soft, NT Incision C/D/I: steri-strips removed    Assessment:     S/P c-section Desires contraception    Plan:     Depo today FU in 4 weeks

## 2013-06-24 NOTE — Addendum Note (Signed)
Addended by: Jill Side on: 06/24/2013 03:06 PM   Modules accepted: Orders

## 2013-06-24 NOTE — Progress Notes (Signed)
Pt reports she is having some pain and burning sensation on her Rt side and it feels like pins and needles.

## 2013-07-23 ENCOUNTER — Encounter: Payer: Self-pay | Admitting: Obstetrics & Gynecology

## 2013-07-23 ENCOUNTER — Ambulatory Visit (INDEPENDENT_AMBULATORY_CARE_PROVIDER_SITE_OTHER): Payer: Medicaid Other | Admitting: Obstetrics & Gynecology

## 2013-07-23 NOTE — Progress Notes (Signed)
Patient ID: JAVAYAH MAGAW, female   DOB: August 18, 1986, 27 y.o.   MRN: [id number] Subjective:     ATHALEE ESTERLINE is a 27 y.o. female who presents for a postpartum visit. She is 5 weeks postpartum following a low cervical transverse Cesarean section. I have fully reviewed the prenatal and intrapartum course. The delivery was at term. Outcome: primary cesarean section, low transverse incision. Anesthesia: epidural. Postpartum course has been uncomplicated . Baby's course has been unremarkable. Baby is feeding by bottle. Bleeding no bleeding. Bowel function is normal. Bladder function is normal. Patient is not sexually active. Contraception method is abstinence and depo provera. Postpartum depression screening: negative.  The following portions of the patient's history were reviewed and updated as appropriate: allergies, current medications, past family history, past medical history, past social history, past surgical history and problem list.  Review of Systems Pertinent items are noted in HPI.   Objective:    BP 124/74  Pulse 61  Temp(Src) 97.4 F (36.3 C)  Ht 5\' 8"  (1.727 m)  Wt 147 lb 1.6 oz (66.724 kg)  BMI 22.37 kg/m2  Breastfeeding? No  General:  alert and no distress           Abdomen: soft, non-tender; bowel sounds normal; no masses,  no organomegaly and incision clean, dry and intact.  healing well                          Assessment:     5 weeks postpartum exam. Pap smear not done at today's visit.   Plan:    1. Contraception: Depo-Provera injections 2. D/w pt normal post op expections may gradually increase activities 3. Follow up in: 9 weeks or as needed.

## 2013-07-23 NOTE — Patient Instructions (Signed)

## 2013-07-23 NOTE — Progress Notes (Signed)
Here for postpartum visit.  States still has constipation and abd pain at times, abd hurts if you touch it.

## 2013-09-09 ENCOUNTER — Ambulatory Visit: Payer: Medicaid Other

## 2013-09-23 ENCOUNTER — Ambulatory Visit (INDEPENDENT_AMBULATORY_CARE_PROVIDER_SITE_OTHER): Payer: Medicaid Other | Admitting: *Deleted

## 2013-09-23 ENCOUNTER — Ambulatory Visit: Payer: Medicaid Other

## 2013-09-23 VITALS — BP 128/81 | HR 67 | Temp 97.4°F | Ht 68.0 in | Wt 145.5 lb

## 2013-09-23 DIAGNOSIS — Z3049 Encounter for surveillance of other contraceptives: Secondary | ICD-10-CM

## 2013-09-23 MED ORDER — MEDROXYPROGESTERONE ACETATE 104 MG/0.65ML ~~LOC~~ SUSP
104.0000 mg | Freq: Once | SUBCUTANEOUS | Status: AC
  Administered 2013-09-23: 104 mg via SUBCUTANEOUS

## 2013-12-23 ENCOUNTER — Ambulatory Visit: Payer: Medicaid Other

## 2013-12-30 ENCOUNTER — Ambulatory Visit (INDEPENDENT_AMBULATORY_CARE_PROVIDER_SITE_OTHER): Payer: Medicaid Other | Admitting: *Deleted

## 2013-12-30 VITALS — BP 134/86 | HR 66

## 2013-12-30 DIAGNOSIS — Z3049 Encounter for surveillance of other contraceptives: Secondary | ICD-10-CM

## 2013-12-30 MED ORDER — MEDROXYPROGESTERONE ACETATE 104 MG/0.65ML ~~LOC~~ SUSP
104.0000 mg | Freq: Once | SUBCUTANEOUS | Status: AC
  Administered 2013-12-30: 104 mg via SUBCUTANEOUS

## 2014-01-16 ENCOUNTER — Encounter (HOSPITAL_COMMUNITY): Payer: Self-pay | Admitting: Emergency Medicine

## 2014-01-16 ENCOUNTER — Emergency Department (HOSPITAL_COMMUNITY)
Admission: EM | Admit: 2014-01-16 | Discharge: 2014-01-16 | Disposition: A | Discharge status: Home / Self Care | Payer: Medicaid Other | Attending: Emergency Medicine | Admitting: Emergency Medicine

## 2014-01-16 DIAGNOSIS — R519 Headache, unspecified: Secondary | ICD-10-CM

## 2014-01-16 DIAGNOSIS — R51 Headache: Secondary | ICD-10-CM

## 2014-01-16 DIAGNOSIS — R110 Nausea: Secondary | ICD-10-CM | POA: Insufficient documentation

## 2014-01-16 DIAGNOSIS — J329 Chronic sinusitis, unspecified: Secondary | ICD-10-CM

## 2014-01-16 DIAGNOSIS — Z8719 Personal history of other diseases of the digestive system: Secondary | ICD-10-CM | POA: Insufficient documentation

## 2014-01-16 MED ORDER — AMOXICILLIN-POT CLAVULANATE 875-125 MG PO TABS: 1.0000 | ORAL_TABLET | Freq: Two times a day (BID) | ORAL | Status: DC

## 2014-01-16 MED ORDER — IBUPROFEN 800 MG PO TABS
800.0000 mg | ORAL_TABLET | Freq: Once | ORAL | Status: AC
  Administered 2014-01-16: 800 mg via ORAL
  Filled 2014-01-16: qty 1

## 2014-01-16 NOTE — ED Notes (Addendum)
Patient reports to ED for intermittent HA for the past few weeks. Patient states that productive cough with green sputum and cold have also been going of for the past 2 weeks. Pt states that she came in today because with nose blowing she has started having scant amount of blood in mucus as well as pressure feeling in face. Pt states that nausea and headaches occurred after receiving new type of depo shot.

## 2014-01-16 NOTE — Discharge Instructions (Signed)
Use homemade netty pot with salt solution/ warm water. If you were given medicines take as directed.  If you are on coumadin or contraceptives realize their levels and effectiveness is altered by many different medicines.  If you have any reaction (rash, tongues swelling, other) to the medicines stop taking and see a physician.   Please follow up as directed and return to the ER or see a physician for new or worsening symptoms or if no improvement in one week  Thank you.

## 2014-01-16 NOTE — ED Notes (Signed)
She tells me she has had frequent "migraine headaches" since receiving a Depo Provera injection the middle of Jan. This year.  She further tells me that for the past three or four days she's had facial/sinus area pain and pressure and she has blown out some bloody nasal drng.  She is in no distress.

## 2014-01-16 NOTE — ED Provider Notes (Signed)
CSN: [id number]     Arrival date & time 01/16/14  1316 History   First MD Initiated Contact with Patient 01/16/14 1331     Chief Complaint  Patient presents with  . Headache  . Nausea  . Cough   (Consider location/radiation/quality/duration/timing/severity/associated sxs/prior Treatment) HPI Comments: 28 yo female with intermittent gradual onset HA for two weeks, pt has had congestion/ productive cough recently, mild epistaxis recently.  No fevers, neck stiffness, blood clot hx, recent surgery or travel.  Pt has been on depot for years, no issues.  HA frontal sinus region.   Patient is a 28 y.o. female presenting with headaches and cough. The history is provided by the patient.  Headache Associated symptoms: congestion and cough   Associated symptoms: no abdominal pain, no back pain, no fever, no neck pain, no neck stiffness and no vomiting   Cough Associated symptoms: headaches   Associated symptoms: no chest pain, no chills, no fever, no rash and no shortness of breath     Past Medical History  Diagnosis Date  . Gallstones    Past Surgical History  Procedure Laterality Date  . Induced abortion  02/2012  . Cholecystectomy  08/28/2012    Procedure: LAPAROSCOPIC CHOLECYSTECTOMY WITH INTRAOPERATIVE CHOLANGIOGRAM;  Surgeon: Lodema PilotBrian Layton, DO;  Location: [facility];  Service: General;  Laterality: N/A;  . Cesarean section N/A 06/16/2013    Procedure: CESAREAN SECTION;  Surgeon: Reva Boresanya S Pratt, MD;  Location: [facility];  Service: Obstetrics;  Laterality: N/A;   Family History  Problem Relation Age of Onset  . Other Neg Hx   . Diabetes Neg Hx   . Heart disease Neg Hx   . Hyperlipidemia Neg Hx   . Hypertension Neg Hx   . Stroke Neg Hx    History  Substance Use Topics  . Smoking status: Never Smoker   . Smokeless tobacco: Never Used  . Alcohol Use: No   OB History   Grav Para Term Preterm Abortions TAB SAB Ect Mult Living   3 2 2  1 1  0   2     Review of Systems  Constitutional:  Negative for fever and chills.  HENT: Positive for congestion and nosebleeds.   Eyes: Negative for visual disturbance.  Respiratory: Positive for cough. Negative for shortness of breath.   Cardiovascular: Negative for chest pain.  Gastrointestinal: Negative for vomiting and abdominal pain.  Genitourinary: Negative for dysuria and flank pain.  Musculoskeletal: Negative for back pain, neck pain and neck stiffness.  Skin: Negative for rash.  Neurological: Positive for headaches. Negative for light-headedness.    Allergies  Review of patient's allergies indicates no known allergies.  Home Medications   Current Outpatient Rx  Name  Route  Sig  Dispense  Refill  . medroxyPROGESTERone (DEPO-SUBQ PROVERA) 104 MG/0.65ML injection   Subcutaneous   Inject 104 mg into the skin every 3 (three) months.         Marland Kitchen. amoxicillin-clavulanate (AUGMENTIN) 875-125 MG per tablet   Oral   Take 1 tablet by mouth 2 (two) times daily. One po bid x 7 days   14 tablet   0    BP 109/70  Pulse 69  Temp(Src) 98.3 F (36.8 C) (Oral)  Resp 16  SpO2 100% Physical Exam  Nursing note and vitals reviewed. Constitutional: She is oriented to person, place, and time. She appears well-developed and well-nourished.  HENT:  Head: Normocephalic and atraumatic.  Dried blood left nare, no active bleeding, no hematoma, Congested,  mild maxillary sinus tenderness  Eyes: Conjunctivae are normal. Right eye exhibits no discharge. Left eye exhibits no discharge.  Neck: Normal range of motion. Neck supple. No tracheal deviation present.  Cardiovascular: Normal rate and regular rhythm.   Pulmonary/Chest: Effort normal and breath sounds normal.  Abdominal: Soft. She exhibits no distension. There is no tenderness. There is no guarding.  Musculoskeletal: She exhibits no edema and no tenderness.  Neurological: She is alert and oriented to person, place, and time.  Skin: Skin is warm. No rash noted.  Psychiatric: She has a  normal mood and affect.    ED Course  Procedures (including critical care time) Labs Review Labs Reviewed - No data to display Imaging Review No results found.  EKG Interpretation   None       MDM   1. Sinusitis   2. Headache    No meningismus.  Considered sinus venous thrombosis however pt has been on depot for years and sxs consistent with sinusitis/ bronchitis.  Ibuprofen in ED.   Results and differential diagnosis were discussed with the patient. Close follow up outpatient was discussed, patient comfortable with the plan.         Enid Skeens, MD 01/16/14 [phone]

## 2014-02-03 ENCOUNTER — Emergency Department (HOSPITAL_COMMUNITY)
Admission: EM | Admit: 2014-02-03 | Discharge: 2014-02-03 | Disposition: A | Discharge status: Home / Self Care | Payer: Medicaid Other | Attending: Emergency Medicine | Admitting: Emergency Medicine

## 2014-02-03 ENCOUNTER — Encounter (HOSPITAL_COMMUNITY): Payer: Self-pay | Admitting: Emergency Medicine

## 2014-02-03 DIAGNOSIS — E860 Dehydration: Secondary | ICD-10-CM

## 2014-02-03 DIAGNOSIS — R42 Dizziness and giddiness: Secondary | ICD-10-CM

## 2014-02-03 DIAGNOSIS — R110 Nausea: Secondary | ICD-10-CM | POA: Insufficient documentation

## 2014-02-03 DIAGNOSIS — R197 Diarrhea, unspecified: Secondary | ICD-10-CM | POA: Insufficient documentation

## 2014-02-03 DIAGNOSIS — R51 Headache: Secondary | ICD-10-CM | POA: Insufficient documentation

## 2014-02-03 DIAGNOSIS — Z3202 Encounter for pregnancy test, result negative: Secondary | ICD-10-CM | POA: Insufficient documentation

## 2014-02-03 DIAGNOSIS — Z8719 Personal history of other diseases of the digestive system: Secondary | ICD-10-CM | POA: Insufficient documentation

## 2014-02-03 LAB — COMPREHENSIVE METABOLIC PANEL
ALBUMIN: 4.3 g/dL (ref 3.5–5.2)
ALK PHOS: 71 U/L (ref 39–117)
ALT: 14 U/L (ref 0–35)
AST: 20 U/L (ref 0–37)
BILIRUBIN TOTAL: 0.2 mg/dL — AB (ref 0.3–1.2)
BUN: 14 mg/dL (ref 6–23)
CHLORIDE: 105 meq/L (ref 96–112)
CO2: 24 mEq/L (ref 19–32)
Calcium: 9.6 mg/dL (ref 8.4–10.5)
Creatinine, Ser: 0.68 mg/dL (ref 0.50–1.10)
GFR calc Af Amer: >90 mL/min (ref >90)
GFR calc non Af Amer: >90 mL/min (ref >90)
Glucose, Bld: 76 mg/dL (ref 70–99)
POTASSIUM: 3.9 meq/L (ref 3.7–5.3)
SODIUM: 141 meq/L (ref 137–147)
TOTAL PROTEIN: 8.2 g/dL (ref 6.0–8.3)

## 2014-02-03 LAB — URINE MICROSCOPIC-ADD ON

## 2014-02-03 LAB — CBC WITH DIFFERENTIAL/PLATELET
BASOS ABS: 0 10*3/uL (ref 0.0–0.1)
BASOS PCT: 0 % (ref 0–1)
EOS ABS: 0.1 10*3/uL (ref 0.0–0.7)
Eosinophils Relative: 2 % (ref 0–5)
HCT: 36.3 % (ref 36.0–46.0)
HEMOGLOBIN: 12.2 g/dL (ref 12.0–15.0)
Lymphocytes Relative: 42 % (ref 12–46)
Lymphs Abs: 3 10*3/uL (ref 0.7–4.0)
MCH: 28.4 pg (ref 26.0–34.0)
MCHC: 33.6 g/dL (ref 30.0–36.0)
MCV: 84.6 fL (ref 78.0–100.0)
Monocytes Absolute: 0.4 10*3/uL (ref 0.1–1.0)
Monocytes Relative: 6 % (ref 3–12)
NEUTROS ABS: 3.6 10*3/uL (ref 1.7–7.7)
NEUTROS PCT: 50 % (ref 43–77)
PLATELETS: 255 10*3/uL (ref 150–400)
RBC: 4.29 MIL/uL (ref 3.87–5.11)
RDW: 13.2 % (ref 11.5–15.5)
WBC: 7.1 10*3/uL (ref 4.0–10.5)

## 2014-02-03 LAB — URINALYSIS, ROUTINE W REFLEX MICROSCOPIC
BILIRUBIN URINE: NEGATIVE
GLUCOSE, UA: NEGATIVE mg/dL
Hgb urine dipstick: NEGATIVE
KETONES UR: NEGATIVE mg/dL
LEUKOCYTES UA: NEGATIVE
NITRITE: NEGATIVE
PH: 6 (ref 5.0–8.0)
Protein, ur: 30 mg/dL — AB
SPECIFIC GRAVITY, URINE: 1.034 — AB (ref 1.005–1.030)
Urobilinogen, UA: 1 mg/dL (ref 0.0–1.0)

## 2014-02-03 LAB — LIPASE, BLOOD: LIPASE: 24 U/L (ref 11–59)

## 2014-02-03 LAB — POCT PREGNANCY, URINE: Preg Test, Ur: NEGATIVE

## 2014-02-03 MED ORDER — SODIUM CHLORIDE 0.9 % IV BOLUS (SEPSIS): 1000.0000 mL | Freq: Once | INTRAVENOUS | Status: DC

## 2014-02-03 MED ORDER — ONDANSETRON 4 MG PO TBDP: 4.0000 mg | ORAL_TABLET | Freq: Three times a day (TID) | ORAL | Status: DC | PRN

## 2014-02-03 NOTE — Discharge Instructions (Signed)
Call for a follow up appointment with a Family or Primary Care Provider.  [facility], for further evaluation of your Depo-shots. Return if Symptoms worsen.   Take medication as prescribed.  Drink plenty of water and eat a healthy diet.

## 2014-02-03 NOTE — ED Notes (Signed)
MD at bedside evaluating.

## 2014-02-03 NOTE — ED Provider Notes (Signed)
CSN: [id number]     Arrival date & time 02/03/14  1320 History   First MD Initiated Contact with Patient 02/03/14 1654     Chief Complaint  Patient presents with  . Nausea  . Weakness  . Diarrhea     (Consider location/radiation/quality/duration/timing/severity/associated sxs/prior Treatment) HPI Comments: 28 yo G4P2 black female with a PMH of Cesarean section, Cholecystectomy, and D&C presents with chief complaint of weakness and diarrhea starting 2 days ago. She states that she has had 2 episodes of loose stool this morning without any blood in her stool. Patient also complains of lightheaded and nauseated without emesis for several weeks, worsened by standing from a seated position. She also reports a band-like headache off/on for 2-3 days. She describes the headache as "less than a migraine" and ranks it 8/10. She has taken 200 mg Ibuprofen X2 with minimal relief. Patient states that she has been receiving the Depo Provera shot for birth control and states that she was last sexually active in November. Her LMP was on February 1st and states that she had a 10 day period.  Patient denies vomiting, abdominal pain, chest pain, SOB, vision changes, and dysphagia.   Patient is a 28 y.o. female presenting with weakness and diarrhea. The history is provided by the patient. No language interpreter was used.  Weakness Associated symptoms include headaches, nausea and weakness. Pertinent negatives include no abdominal pain, chest pain, chills, coughing, fatigue, fever, sore throat or vomiting.  Diarrhea Associated symptoms: headaches   Associated symptoms: no abdominal pain, no chills, no fever and no vomiting     Past Medical History  Diagnosis Date  . Gallstones    Past Surgical History  Procedure Laterality Date  . Induced abortion  02/2012  . Cholecystectomy  08/28/2012    Procedure: LAPAROSCOPIC CHOLECYSTECTOMY WITH INTRAOPERATIVE CHOLANGIOGRAM;  Surgeon: Lodema PilotBrian Layton, DO;  Location: [facility];   Service: General;  Laterality: N/A;  . Cesarean section N/A 06/16/2013    Procedure: CESAREAN SECTION;  Surgeon: Reva Boresanya S Pratt, MD;  Location: [facility];  Service: Obstetrics;  Laterality: N/A;   Family History  Problem Relation Age of Onset  . Other Neg Hx   . Diabetes Neg Hx   . Heart disease Neg Hx   . Hyperlipidemia Neg Hx   . Hypertension Neg Hx   . Stroke Neg Hx    History  Substance Use Topics  . Smoking status: Never Smoker   . Smokeless tobacco: Never Used  . Alcohol Use: No   OB History   Grav Para Term Preterm Abortions TAB SAB Ect Mult Living   3 2 2  1 1  0   2     Review of Systems  Constitutional: Negative for fever, chills and fatigue.  HENT: Negative for ear pain, rhinorrhea, sore throat and trouble swallowing.   Eyes: Negative for discharge and visual disturbance.  Respiratory: Negative for cough, chest tightness and shortness of breath.   Cardiovascular: Negative for chest pain.  Gastrointestinal: Positive for nausea and diarrhea. Negative for vomiting, abdominal pain and blood in stool.  Genitourinary: Negative for dysuria, urgency, frequency, vaginal bleeding, difficulty urinating and pelvic pain.  Neurological: Positive for weakness, light-headedness and headaches.      Allergies  Review of patient's allergies indicates no known allergies.  Home Medications   Current Outpatient Rx  Name  Route  Sig  Dispense  Refill  . ibuprofen (ADVIL,MOTRIN) 200 MG tablet   Oral   Take 400 mg by mouth  every 6 (six) hours as needed for moderate pain.          BP 138/74  Pulse 67  Temp(Src) 97.7 F (36.5 C) (Oral)  Resp 16  SpO2 100%  LMP 01/18/2014 Physical Exam  Nursing note and vitals reviewed. Constitutional: She is oriented to person, place, and time. She appears well-developed and well-nourished. No distress.  HENT:  Head: Normocephalic and atraumatic.  Mouth/Throat: Oropharynx is clear and moist. No oropharyngeal exudate.  Eyes: Pupils are equal,  round, and reactive to light. Right eye exhibits no discharge. Left eye exhibits no discharge.  Neck: Normal range of motion. Neck supple.  Cardiovascular: Normal rate, regular rhythm and normal heart sounds.   No murmur heard. Pulmonary/Chest: Effort normal and breath sounds normal. No respiratory distress. She has no wheezes. She has no rales.  Abdominal: Soft. Bowel sounds are normal. She exhibits no distension. There is no tenderness. There is no rebound and no guarding.  Musculoskeletal: Normal range of motion. She exhibits no edema.  Lymphadenopathy:    She has no cervical adenopathy.  Neurological: She is alert and oriented to person, place, and time. No cranial nerve deficit.  Skin: Skin is warm and dry. She is not diaphoretic.  Psychiatric: She has a normal mood and affect. Her behavior is normal. Thought content normal.    ED Course  Procedures (including critical care time) Labs Review Labs Reviewed  COMPREHENSIVE METABOLIC PANEL - Abnormal; Notable for the following:    Total Bilirubin 0.2 (*)    All other components within normal limits  URINALYSIS, ROUTINE W REFLEX MICROSCOPIC - Abnormal; Notable for the following:    Specific Gravity, Urine 1.034 (*)    Protein, ur 30 (*)    All other components within normal limits  CBC WITH DIFFERENTIAL  LIPASE, BLOOD  URINE MICROSCOPIC-ADD ON  POCT PREGNANCY, URINE   Imaging Review No results found.  EKG Interpretation   None       MDM   Final diagnoses:  Dehydration  Lightheadedness   Pt with multiple episode of loose stool, and episodes of ightheadedness worsened by standing from a seated position.  CBC no anemia, CMP WNL, UA shows concentrated urine. Negative pregnancy.  Likely orthostatic hypotension and dehydration. Discussed lab results, and treatment plan with the patient. Return precautions given. Reports understanding and no other concerns at this time.  Patient is stable for discharge at this  time.      Clabe Seal, PA-C 02/05/14 2107

## 2014-02-03 NOTE — ED Notes (Addendum)
Pt reports feel bad for 2-3 days. Feeling nauseated, weak, no appetite with headache also diarrhea. Pt denies any pain. Pt is a x 4. In NAD. Reports depo injection.

## 2014-02-07 NOTE — ED Provider Notes (Signed)
Medical screening examination/treatment/procedure(s) were performed by non-physician practitioner and as supervising physician I was immediately available for consultation/collaboration.   Candyce Churn III, MD 02/07/14 [phone]

## 2014-02-20 ENCOUNTER — Encounter (HOSPITAL_COMMUNITY): Payer: Self-pay | Admitting: Emergency Medicine

## 2014-02-20 ENCOUNTER — Emergency Department (HOSPITAL_COMMUNITY)
Admission: EM | Admit: 2014-02-20 | Discharge: 2014-02-20 | Disposition: A | Discharge status: Home / Self Care | Payer: Medicaid Other | Attending: Emergency Medicine | Admitting: Emergency Medicine

## 2014-02-20 DIAGNOSIS — Z8719 Personal history of other diseases of the digestive system: Secondary | ICD-10-CM | POA: Insufficient documentation

## 2014-02-20 DIAGNOSIS — R609 Edema, unspecified: Secondary | ICD-10-CM | POA: Insufficient documentation

## 2014-02-20 DIAGNOSIS — K047 Periapical abscess without sinus: Secondary | ICD-10-CM

## 2014-02-20 DIAGNOSIS — K0381 Cracked tooth: Secondary | ICD-10-CM | POA: Insufficient documentation

## 2014-02-20 DIAGNOSIS — K029 Dental caries, unspecified: Secondary | ICD-10-CM | POA: Insufficient documentation

## 2014-02-20 MED ORDER — TRAMADOL HCL 50 MG PO TABS: 50.0000 mg | ORAL_TABLET | Freq: Four times a day (QID) | ORAL | Status: DC | PRN

## 2014-02-20 MED ORDER — PENICILLIN V POTASSIUM 500 MG PO TABS: 500.0000 mg | ORAL_TABLET | Freq: Four times a day (QID) | ORAL | Status: AC

## 2014-02-20 MED ORDER — TRAMADOL HCL 50 MG PO TABS
50.0000 mg | ORAL_TABLET | Freq: Once | ORAL | Status: AC
  Administered 2014-02-20: 50 mg via ORAL
  Filled 2014-02-20: qty 1

## 2014-02-20 NOTE — ED Notes (Signed)
Pt c/o dental pain and rt sided jaw swelling since yesterday.

## 2014-02-20 NOTE — ED Provider Notes (Signed)
Medical screening examination/treatment/procedure(s) were performed by non-physician practitioner and as supervising physician I was immediately available for consultation/collaboration.   EKG Interpretation None        Rolan Bucco, MD 02/20/14 1105

## 2014-02-20 NOTE — ED Provider Notes (Signed)
CSN: [id number]     Arrival date & time 02/20/14  1751 History   First MD Initiated Contact with Patient 02/20/14 1007     Chief Complaint  Patient presents with  . Dental Pain   (Consider location/radiation/quality/duration/timing/severity/associated sxs/prior Treatment) Patient is a 28 y.o. female presenting with tooth pain. The history is provided by the patient and medical records.  Dental Pain Associated symptoms: facial swelling    This is a 28 y.o.F with PMH significant for gallstones presenting to the ED for right lower dental pain and swelling.  Pt states tooth starting hurting yesterday, she took ibuprofen with some improvement but awoke this morning to right facial swelling.  Denies any difficulty swallowing or breathing, no trismus.  Pt does have a dentist that she see's regularly-- has not contacted them yet.  No fevers or chills.   Past Medical History  Diagnosis Date  . Gallstones    Past Surgical History  Procedure Laterality Date  . Induced abortion  02/2012  . Cholecystectomy  08/28/2012    Procedure: LAPAROSCOPIC CHOLECYSTECTOMY WITH INTRAOPERATIVE CHOLANGIOGRAM;  Surgeon: [name], DO;  Location: [facility];  Service: General;  Laterality: N/A;  . Cesarean section N/A 06/16/2013    Procedure: CESAREAN SECTION;  Surgeon: Reva Bores, MD;  Location: [facility];  Service: Obstetrics;  Laterality: N/A;   Family History  Problem Relation Age of Onset  . Other Neg Hx   . Diabetes Neg Hx   . Heart disease Neg Hx   . Hyperlipidemia Neg Hx   . Hypertension Neg Hx   . Stroke Neg Hx    History  Substance Use Topics  . Smoking status: Never Smoker   . Smokeless tobacco: Never Used  . Alcohol Use: No   OB History   Grav Para Term Preterm Abortions TAB SAB Ect Mult Living   3 2 2  1 1  0   2     Review of Systems  HENT: Positive for dental problem and facial swelling.   All other systems reviewed and are negative.      Allergies  Review of patient's allergies  indicates no known allergies.  Home Medications   Current Outpatient Rx  Name  Route  Sig  Dispense  Refill  . ibuprofen (ADVIL,MOTRIN) 200 MG tablet   Oral   Take 400 mg by mouth every 6 (six) hours as needed for moderate pain.         Marland Kitchen ondansetron (ZOFRAN ODT) 4 MG disintegrating tablet   Oral   Take 1 tablet (4 mg total) by mouth every 8 (eight) hours as needed for nausea or vomiting.   10 tablet   0   . penicillin v potassium (VEETID) 500 MG tablet   Oral   Take 1 tablet (500 mg total) by mouth 4 (four) times daily.   40 tablet   0   . traMADol (ULTRAM) 50 MG tablet   Oral   Take 1 tablet (50 mg total) by mouth every 6 (six) hours as needed.   15 tablet   0    BP 124/76  Pulse 75  Temp(Src) 99.7 F (37.6 C) (Oral)  Resp 18  SpO2 100%  LMP 01/18/2014  Physical Exam  Nursing note and vitals reviewed. Constitutional: She is oriented to person, place, and time. She appears well-developed and well-nourished. No distress.  HENT:  Head: Normocephalic and atraumatic.  Mouth/Throat: Uvula is midline, oropharynx is clear and moist and mucous membranes are normal. No  trismus in the jaw. Normal dentition. Dental abscesses and dental caries present. No oropharyngeal exudate, posterior oropharyngeal edema, posterior oropharyngeal erythema or tonsillar abscesses.    Teeth largely in fair dentition, few dental caries, right lower molar broken and flush with gumline, surrounding gingiva swollen and erythematous, mild swelling of right mandible without extension into neck; handling secretions appropriately, no difficulty swallowing, no trismus  Eyes: Conjunctivae and EOM are normal. Pupils are equal, round, and reactive to light.  Neck: Normal range of motion. Neck supple.  Cardiovascular: Normal rate, regular rhythm and normal heart sounds.   Pulmonary/Chest: Breath sounds normal. No respiratory distress. She has no wheezes.  Musculoskeletal: Normal range of motion.    Neurological: She is alert and oriented to person, place, and time.  Skin: Skin is warm and dry. She is not diaphoretic.  Psychiatric: She has a normal mood and affect.    ED Course  Procedures (including critical care time) Labs Review Labs Reviewed - No data to display Imaging Review No results found.   EKG Interpretation None      MDM   Final diagnoses:  Dental abscess   Right lower dental pain with signs of dental abscess.  Pt will be started on tramadol and penicillin. She is instructed to follow up with her dentist next week.  Discussed plan with pt, they agreed.  Return precautions advised.  Garlon HatchetLisa M Naquisha Whitehair, PA-C 02/20/14 1056

## 2014-02-20 NOTE — Discharge Instructions (Signed)
Take the prescribed medication as directed-- advise taking with tylenol for added relief. Follow-up with your dentist-- call and schedule appt. Return to the ED for new or worsening symptoms.

## 2014-03-24 ENCOUNTER — Ambulatory Visit: Payer: Medicaid Other

## 2014-03-25 ENCOUNTER — Encounter: Payer: Self-pay | Admitting: Family Medicine

## 2014-03-25 ENCOUNTER — Ambulatory Visit (INDEPENDENT_AMBULATORY_CARE_PROVIDER_SITE_OTHER): Payer: Medicaid Other | Admitting: Family Medicine

## 2014-03-25 VITALS — BP 119/71 | HR 64 | Temp 98.4°F | Ht 68.0 in | Wt 153.0 lb

## 2014-03-25 DIAGNOSIS — Z0000 Encounter for general adult medical examination without abnormal findings: Secondary | ICD-10-CM | POA: Insufficient documentation

## 2014-03-25 DIAGNOSIS — Z01419 Encounter for gynecological examination (general) (routine) without abnormal findings: Secondary | ICD-10-CM | POA: Insufficient documentation

## 2014-03-25 DIAGNOSIS — Z309 Encounter for contraceptive management, unspecified: Secondary | ICD-10-CM

## 2014-03-25 MED ORDER — MEDROXYPROGESTERONE ACETATE 150 MG/ML IM SUSP
150.0000 mg | Freq: Once | INTRAMUSCULAR | Status: AC
  Administered 2014-03-25: 150 mg via INTRAMUSCULAR

## 2014-03-25 NOTE — Patient Instructions (Signed)
Ms. Laird, it was great to meet you today!  For your exercise: The goal is to work out approx 3 times a week in the next several months and slowly increase exercise Include both weight bearing activities and cardio  For your skin: circular motions with vaseline and mederma otc can help to reduce keloid formation; always use sunscreen when skin exposed for prolonged periods of time.  I will see you back in clinic in 4months-1year or sooner as needed Please schedule nursing visits for depo injections  [facility], MD

## 2014-03-25 NOTE — Progress Notes (Signed)
Patient ID: Katherine BalesBarbara A Lindsey, female   DOB: 01/17/1986, 28 y.o.   MRN: [id number] [facility] Katherine FerrettiMelanie C Melvina Pangelinan, MD Phone: [phone]  Subjective:  Katherine Lindsey is a new patient to me here to establish care  Previously followed by Dr. Bruna PotterBlount, transitioning care.  [facility] 2014. Has otherwise been well no current complaints. LMP 03/17/14; usually lasting anywhere from 7-10 days on depo subq; had previously used IM injections and was almost amenorrheic. Would like to switch back. Has current side effects to subq injections including dizziness, presyncope, fatigue.    Previously told she had Crohn's, diagnosed by GI in [place] during college. However subsequent scopes and appointment had confirmed this was "cured" Had endorsed symptoms including abdominal discomfort, epigastric pain, hemorrhoids, GI bleeds. Has not experienced since 2004.   All systems were reviewed and were negative unless otherwise noted in the HPI  Past Medical History Patient Active Problem List   Diagnosis Date Noted  . Encounter for supervision of normal first pregnancy in second trimester 02/04/2013  . Normal IUP (intrauterine pregnancy) on prenatal ultrasound 10/17/2012  . Vaginal spotting 10/17/2012  . Positive pregnancy test 10/08/2012   Reviewed problem list.  Medications- reviewed and updated Chief complaint-noted No additions to family history- lives at home with 2 daughters ages 413 and 8 months.  Social history- patient is a never smoker  Objective: BP 119/71  Pulse 64  Temp(Src) 98.4 F (36.9 C) (Oral)  Ht 5\' 8"  (1.727 m)  Wt 153 lb (69.4 kg)  BMI 23.27 kg/m2  LMP 03/24/2014  Breastfeeding? No Gen: NAD, alert, cooperative with exam HEENT: NCAT, EOMI, PERRL, TMs nml, no petechial involvement, no ulceration; dental caries Neck: FROM, supple, no lymphadenopathy CV: RRR, good S1/S2, no murmur, cap refill <3 Resp: CTABL, no wheezes,  non-labored Abd: SNTND, BS present, no guarding or organomegaly; prior c-section scar and chole scars from port placement with slight keloid formation Ext: No edema, warm, normal tone, moves UE/LE spontaneously Neuro: Alert and oriented, No gross deficits Skin: no rashes, keloids (see above)  Assessment/Plan: See problem based a/p

## 2014-03-25 NOTE — Assessment & Plan Note (Addendum)
Last had pap in september 2014- nml Not currently attempting to become pregnant Desires depo injections Normotensive No alcohol abuse; social drinker Plan to increase physical activity and include more proteins/fibers in diet; 3 month goal for 3 day a week work outs

## 2014-06-16 ENCOUNTER — Ambulatory Visit: Payer: Medicaid Other

## 2014-06-23 ENCOUNTER — Ambulatory Visit (INDEPENDENT_AMBULATORY_CARE_PROVIDER_SITE_OTHER): Payer: Medicaid Other | Admitting: *Deleted

## 2014-06-23 DIAGNOSIS — Z3049 Encounter for surveillance of other contraceptives: Secondary | ICD-10-CM

## 2014-06-23 DIAGNOSIS — Z3042 Encounter for surveillance of injectable contraceptive: Secondary | ICD-10-CM

## 2014-06-23 MED ORDER — MEDROXYPROGESTERONE ACETATE 150 MG/ML IM SUSP
150.0000 mg | Freq: Once | INTRAMUSCULAR | Status: AC
  Administered 2014-06-23: 150 mg via INTRAMUSCULAR

## 2014-06-23 NOTE — Progress Notes (Signed)
   Pt in for Depo Provera injection.  Pt tolerated Depo injection. Depo given left upper outer quadrant.  Next injection due Sept 23 - Sep 22, 2014.  Reminder card given. Clovis PuMartin, Chalon Zobrist L, RN

## 2014-09-15 ENCOUNTER — Ambulatory Visit: Payer: Medicaid Other

## 2014-09-21 ENCOUNTER — Ambulatory Visit: Payer: Medicaid Other

## 2014-09-21 ENCOUNTER — Encounter: Payer: Self-pay | Admitting: Family Medicine

## 2014-09-21 ENCOUNTER — Ambulatory Visit (INDEPENDENT_AMBULATORY_CARE_PROVIDER_SITE_OTHER): Payer: Medicaid Other | Admitting: Family Medicine

## 2014-09-21 VITALS — BP 105/63 | HR 82 | Temp 98.3°F | Resp 16 | Wt 152.0 lb

## 2014-09-21 DIAGNOSIS — Z3042 Encounter for surveillance of injectable contraceptive: Secondary | ICD-10-CM

## 2014-09-21 DIAGNOSIS — M2040 Other hammer toe(s) (acquired), unspecified foot: Secondary | ICD-10-CM

## 2014-09-21 HISTORY — DX: Other hammer toe(s) (acquired), unspecified foot: M20.40

## 2014-09-21 MED ORDER — MEDROXYPROGESTERONE ACETATE 150 MG/ML IM SUSP
150.0000 mg | Freq: Once | INTRAMUSCULAR | Status: AC
  Administered 2014-09-21: 150 mg via INTRAMUSCULAR

## 2014-09-21 NOTE — Patient Instructions (Signed)
Ms Napolitan It was great to see you today!  I am pleased to hear that things are going well for you.  Let me know should you change your mind about depo and desire another form of contraception  The orthopedics office should call you for an appointment Looking forward to seeing you soon [name], MD

## 2014-09-21 NOTE — Progress Notes (Signed)
Patient ID: Katherine Lindsey, female   DOB: 04/09/1986, 28 y.o.   MRN: [id number]   [facility] Katherine Ferretti, MD Phone: [phone]  Subjective:  Katherine Lindsey is a 28 y.o F who present today for   # Hammertoe -on both feet 2nd toe -continues to bother her and is getting worse -now noticing some numbness in her toes -no recent injuries -occasionally will wear foot pads to protect   #Contraception -depo shot -no weight changes with this  -does note some spotting however  All relevant systems were reviewed and were negative unless otherwise noted in the HPI  Past Medical History Reviewed problem list.  Medications- reviewed and updated Current Outpatient Prescriptions  Medication Sig Dispense Refill  . ibuprofen (ADVIL,MOTRIN) 200 MG tablet Take 400 mg by mouth every 6 (six) hours as needed for moderate pain.      . medroxyPROGESTERone (DEPO-SUBQ PROVERA) 104 MG/0.65ML injection Inject 104 mg into the skin every 3 (three) months.      . ondansetron (ZOFRAN ODT) 4 MG disintegrating tablet Take 1 tablet (4 mg total) by mouth every 8 (eight) hours as needed for nausea or vomiting.  10 tablet  0  . traMADol (ULTRAM) 50 MG tablet Take 1 tablet (50 mg total) by mouth every 6 (six) hours as needed.  15 tablet  0   No current facility-administered medications for this visit.   Chief complaint-noted No additions to family history Social history- patient is a never smoker  Objective: BP 105/63  Pulse 82  Temp(Src) 98.3 F (36.8 C) (Oral)  Resp 16  Wt 152 lb (68.947 kg)  SpO2 100%  Breastfeeding? No Ext: No edema, warm, normal tone, moves UE/LE spontaneously, hammer toe on bilat 2nd toes ; DPs/TPs 2+ bilaterally equal and symmetric  Neuro: Alert and oriented, sensation intact to light touch and proprioception  Skin: no rashes shallow callus on top of toe  Assessment/Plan: See problem based a/p

## 2014-09-21 NOTE — Assessment & Plan Note (Signed)
Bilateral Interested in surgical intervention Now having callus formation on top of foot and nerve sx Will refer to ortho for eval

## 2014-09-22 ENCOUNTER — Telehealth: Payer: Self-pay | Admitting: *Deleted

## 2014-09-22 NOTE — Telephone Encounter (Signed)
Attempted to contact again. [name], Katherine Lindsey

## 2014-09-22 NOTE — Telephone Encounter (Signed)
Attempted to call pt.  No answer and VM was busy will try back later today.  Appointment is as follows  09/30/14 @ 9:45am Dr. Theodoro [facility]ist [facility] [address] [place], [place], [place] [address] [phone]

## 2014-09-23 NOTE — Telephone Encounter (Signed)
Patient called back yesterday and was informed at that time. [name], Katherine Lindsey

## 2014-10-18 ENCOUNTER — Encounter: Payer: Self-pay | Admitting: Family Medicine

## 2014-11-05 ENCOUNTER — Encounter: Payer: Self-pay | Admitting: Family Medicine

## 2014-11-05 ENCOUNTER — Ambulatory Visit (INDEPENDENT_AMBULATORY_CARE_PROVIDER_SITE_OTHER): Payer: Medicaid Other | Admitting: Family Medicine

## 2014-11-05 ENCOUNTER — Other Ambulatory Visit (HOSPITAL_COMMUNITY)
Admission: RE | Admit: 2014-11-05 | Discharge: 2014-11-05 | Disposition: A | Discharge status: Home / Self Care | Payer: Medicaid Other | Source: Ambulatory Visit | Attending: Family Medicine | Admitting: Family Medicine

## 2014-11-05 VITALS — BP 131/76 | HR 87 | Temp 98.0°F | Ht 68.0 in | Wt 159.7 lb

## 2014-11-05 DIAGNOSIS — N939 Abnormal uterine and vaginal bleeding, unspecified: Secondary | ICD-10-CM | POA: Insufficient documentation

## 2014-11-05 DIAGNOSIS — N898 Other specified noninflammatory disorders of vagina: Secondary | ICD-10-CM

## 2014-11-05 LAB — POCT WET PREP (WET MOUNT): Clue Cells Wet Prep Whiff POC: POSITIVE

## 2014-11-05 MED ORDER — NORGESTIM-ETH ESTRAD TRIPHASIC 0.18/0.215/0.25 MG-35 MCG PO TABS: 1.0000 | ORAL_TABLET | Freq: Every day | ORAL | Status: DC

## 2014-11-05 NOTE — Progress Notes (Signed)
   Subjective:   Katherine Lindsey is a 28 y.o. female with a history of depo provera use here for vaginal spotting.  Patient reports she's been on Depo Provera since 2011. Previously she would have no vaginal bleeding until it was time to get her next 3 month Depo injection.    Previously she was getting IM Depo injections. In January and April, she [facility]. As she was experiencing spotting and migraines on the subcutaneous version, the patient [facility] our clinic[facility]  Her spotting improved with her first IM injection in July, but recurred before her October injection. Since that time she's had continuous spotting with intermittent bright red blood. Occasionally she will have 2-5 days without any bleeding, but then the spotting recurs.  She would like to stay on Depakote if possible. But she is also considered the Mirena IUD.  Review of Systems:  Per HPI. All other systems reviewed and are negative.   PMH, PSH, Medications, Allergies, and FmHx reviewed and updated in EMR.  Social History: never smoker  Objective:  There were no vitals taken for this visit.  Gen:  28 y.o. female in NAD HEENT: NCAT CV: Regular rate Resp: Non-labored Abd: Soft, NTND, BS present, no guarding or organomegaly GU/Gyn: Normal external female genitalia. Vagina moist with no lesions. Cervix nonfriable with no blood or discharge from cervical os. No lesions noted on cervix. No cervical motion tenderness. Neuro: Alert and oriented, speech normal    Assessment:     Katherine Lindsey is a 28 y.o. female here for vaginal spotting on Depo Provera.    Plan:     See problem list for problem-specific plans.   Shirlee LatchAngela Bacigalupo, MD PGY-1,  [facility] 11/05/2014  3:04 PM

## 2014-11-05 NOTE — Patient Instructions (Addendum)
It was great to meet you today!  For your spotting,  - Get your Depo shot in January when previously scheduled - Take the birth control pills and I'm prescribing for the next 3 months - I hope that this will improve the spotting that you are experiencing currently - If the spotting does not improve, make an appointment to see your PCP - You could consider the Mirena IUD in the future  We are checking some labs today, and someone will call you if they are abnormal. If you do not hear from me with a call or letter in 2 weeks, please call us as I may have been unable to reach you.  Shirlee Latch, MD ([facility]

## 2014-11-05 NOTE — Assessment & Plan Note (Signed)
Likely related to Depo-Provera. - Will try 3 months course of OCP use in addition to Depo-Provera as regularly scheduled in hopes of leveling out hormone levels - Wet prep and GC chlamydia sent. Will follow-up on results - Can consider Mirena IUD in the future if continues to have spotting on Depo

## 2014-11-07 ENCOUNTER — Telehealth: Payer: Self-pay | Admitting: Family Medicine

## 2014-11-07 DIAGNOSIS — B9689 Other specified bacterial agents as the cause of diseases classified elsewhere: Secondary | ICD-10-CM

## 2014-11-07 DIAGNOSIS — N760 Acute vaginitis: Principal | ICD-10-CM

## 2014-11-07 DIAGNOSIS — B3731 Acute candidiasis of vulva and vagina: Secondary | ICD-10-CM

## 2014-11-07 DIAGNOSIS — B373 Candidiasis of vulva and vagina: Secondary | ICD-10-CM

## 2014-11-07 MED ORDER — FLUCONAZOLE 150 MG PO TABS: 150.0000 mg | ORAL_TABLET | Freq: Once | ORAL | Status: DC

## 2014-11-07 MED ORDER — METRONIDAZOLE 500 MG PO TABS: 500.0000 mg | ORAL_TABLET | Freq: Two times a day (BID) | ORAL | Status: DC

## 2014-11-07 NOTE — Telephone Encounter (Signed)
Wet prep results reviewed.  Discussed with patient.  Rx for flagyl 500 BID x7 days for treatment of BV and Diflucan 150mg  once for yeast vaginitis.  Advised patient to take full course of flagyl before taking Diflucan.  PAtient expressed understanding.  Shirlee Latch, MD, MPH PGY-1,  [facility] 11/07/2014 9:56 AM

## 2014-11-08 LAB — CERVICOVAGINAL ANCILLARY ONLY
Chlamydia: NEGATIVE
Neisseria Gonorrhea: NEGATIVE

## 2014-11-15 ENCOUNTER — Telehealth: Payer: Self-pay | Admitting: Family Medicine

## 2014-11-15 NOTE — Telephone Encounter (Signed)
Pt returning call again, says if you call her back within the next hour to call her at [phone]. If longer than one hour call her at [phone]

## 2014-11-15 NOTE — Telephone Encounter (Signed)
Pateint informed of negative results.

## 2014-11-15 NOTE — Telephone Encounter (Signed)
Left message on voicemail for patient to return call. 

## 2014-11-15 NOTE — Telephone Encounter (Signed)
GC/Chlamydia negative  Shirlee Latch, MD, MPH PGY-1,  [facility] 11/15/2014 1:46 PM

## 2014-11-15 NOTE — Telephone Encounter (Signed)
Pt returning call, gave alternate number to be reached.

## 2015-05-18 ENCOUNTER — Emergency Department (HOSPITAL_COMMUNITY)
Admission: EM | Admit: 2015-05-18 | Discharge: 2015-05-19 | Disposition: A | Discharge status: Home / Self Care | Payer: Medicaid Other | Attending: Emergency Medicine | Admitting: Emergency Medicine

## 2015-05-18 ENCOUNTER — Encounter (HOSPITAL_COMMUNITY): Payer: Self-pay | Admitting: Emergency Medicine

## 2015-05-18 DIAGNOSIS — R51 Headache: Secondary | ICD-10-CM | POA: Insufficient documentation

## 2015-05-18 DIAGNOSIS — R42 Dizziness and giddiness: Secondary | ICD-10-CM | POA: Insufficient documentation

## 2015-05-18 DIAGNOSIS — Z792 Long term (current) use of antibiotics: Secondary | ICD-10-CM | POA: Insufficient documentation

## 2015-05-18 DIAGNOSIS — Z793 Long term (current) use of hormonal contraceptives: Secondary | ICD-10-CM | POA: Insufficient documentation

## 2015-05-18 DIAGNOSIS — Z8719 Personal history of other diseases of the digestive system: Secondary | ICD-10-CM | POA: Insufficient documentation

## 2015-05-18 DIAGNOSIS — Z3202 Encounter for pregnancy test, result negative: Secondary | ICD-10-CM | POA: Insufficient documentation

## 2015-05-18 DIAGNOSIS — R519 Headache, unspecified: Secondary | ICD-10-CM

## 2015-05-18 LAB — POC URINE PREG, ED: Preg Test, Ur: NEGATIVE

## 2015-05-18 LAB — CBC WITH DIFFERENTIAL/PLATELET
Basophils Absolute: 0 10*3/uL (ref 0.0–0.1)
Basophils Relative: 0 % (ref 0–1)
Eosinophils Absolute: 0.2 10*3/uL (ref 0.0–0.7)
Eosinophils Relative: 2 % (ref 0–5)
HCT: 40.4 % (ref 36.0–46.0)
HEMOGLOBIN: 13.6 g/dL (ref 12.0–15.0)
Lymphocytes Relative: 47 % — ABNORMAL HIGH (ref 12–46)
Lymphs Abs: 3.7 10*3/uL (ref 0.7–4.0)
MCH: 29.2 pg (ref 26.0–34.0)
MCHC: 33.7 g/dL (ref 30.0–36.0)
MCV: 86.7 fL (ref 78.0–100.0)
MONOS PCT: 8 % (ref 3–12)
Monocytes Absolute: 0.7 10*3/uL (ref 0.1–1.0)
Neutro Abs: 3.4 10*3/uL (ref 1.7–7.7)
Neutrophils Relative %: 43 % (ref 43–77)
PLATELETS: 290 10*3/uL (ref 150–400)
RBC: 4.66 MIL/uL (ref 3.87–5.11)
RDW: 12.6 % (ref 11.5–15.5)
WBC: 8 10*3/uL (ref 4.0–10.5)

## 2015-05-18 LAB — COMPREHENSIVE METABOLIC PANEL
ALK PHOS: 83 U/L (ref 38–126)
ALT: 15 U/L (ref 14–54)
ANION GAP: 11 (ref 5–15)
AST: 20 U/L (ref 15–41)
Albumin: 4.7 g/dL (ref 3.5–5.0)
BILIRUBIN TOTAL: 0.4 mg/dL (ref 0.3–1.2)
BUN: 9 mg/dL (ref 6–20)
CALCIUM: 9.7 mg/dL (ref 8.9–10.3)
CHLORIDE: 103 mmol/L (ref 101–111)
CO2: 24 mmol/L (ref 22–32)
Creatinine, Ser: 0.72 mg/dL (ref 0.44–1.00)
GFR calc Af Amer: >60 mL/min (ref >60)
Glucose, Bld: 97 mg/dL (ref 65–99)
Potassium: 3.8 mmol/L (ref 3.5–5.1)
SODIUM: 138 mmol/L (ref 135–145)
Total Protein: 8.1 g/dL (ref 6.5–8.1)

## 2015-05-18 LAB — URINALYSIS, ROUTINE W REFLEX MICROSCOPIC
Bilirubin Urine: NEGATIVE
GLUCOSE, UA: NEGATIVE mg/dL
HGB URINE DIPSTICK: NEGATIVE
Ketones, ur: NEGATIVE mg/dL
Leukocytes, UA: NEGATIVE
NITRITE: NEGATIVE
PROTEIN: NEGATIVE mg/dL
SPECIFIC GRAVITY, URINE: 1.021 (ref 1.005–1.030)
UROBILINOGEN UA: 1 mg/dL (ref 0.0–1.0)
pH: 6 (ref 5.0–8.0)

## 2015-05-18 NOTE — ED Provider Notes (Signed)
CSN: [id number]     Arrival date & time 05/18/15  2222 History  This chart was scribed for Marisa Severin, MD by Annye Asa, ED Scribe. This patient was seen in room D32C/D32C and the patient's care was started at 2:16 AM.    No chief complaint on file.  The history is provided by the patient. No language interpreter was used.     HPI Comments: Katherine Lindsey is a 28 y.o. female who presents to the Emergency Department complaining of 4 days of intermittent, gradually worsening dull frontal headache with associated dizziness (describes, "sometimes when I close my eyes, it feels like everything is spinning"). Her dizziness is unaffected by position or change in position. Patient states she ate only one meal today; "I could be eating and drinking more." She has taken Aleve for her headache with mild relief. She complains of a headache at present, rated 6/10. Patient denies any head injury. She denies fever, chills, congestion, rhinorrhea, sore throat, otalgia, hearing disturbances.  denies any medication or lifestyle changes, denies any personal or familial history of headache issues, denies personal history of anemia or heavy bleeding.   PCP Dr. Shirlee Latch  Past Medical History  Diagnosis Date  . Gallstones   . Crohn's disease in remission     Hasn't had symptoms since 2004   Past Surgical History  Procedure Laterality Date  . Induced abortion  02/2012  . Cholecystectomy  08/28/2012    Procedure: LAPAROSCOPIC CHOLECYSTECTOMY WITH INTRAOPERATIVE CHOLANGIOGRAM;  Surgeon: [name], DO;  Location: [facility] OR;  Service: General;  Laterality: N/A;  . Cesarean section N/A 06/16/2013    Procedure: CESAREAN SECTION;  Surgeon: Reva Bores, MD;  Location: [facility];  Service: Obstetrics;  Laterality: N/A;   Family History  Problem Relation Age of Onset  . Other Neg Hx   . Diabetes Neg Hx   . Heart disease Neg Hx   . Hyperlipidemia Neg Hx   . Hypertension Neg Hx   . Stroke Neg Hx    History   Substance Use Topics  . Smoking status: Never Smoker   . Smokeless tobacco: Never Used  . Alcohol Use: 0.6 oz/week    1 Glasses of wine per week   OB History    Gravida Para Term Preterm AB TAB SAB Ectopic Multiple Living   0   2     Review of Systems  Constitutional: Negative for fever and chills.  HENT: Negative for congestion, ear pain, rhinorrhea and sore throat.   Gastrointestinal: Negative for vomiting.  Neurological: Positive for dizziness and headaches.  All other systems reviewed and are negative.  Allergies  Review of patient's allergies indicates no known allergies.  Home Medications   Prior to Admission medications   Medication Sig Start Date End Date Taking? Authorizing Provider  fluconazole (DIFLUCAN) 150 MG tablet Take 1 tablet (150 mg total) by mouth once. 11/07/14   Erasmo Downer, MD  ibuprofen (ADVIL,MOTRIN) 200 MG tablet Take 400 mg by mouth every 6 (six) hours as needed for moderate pain.    Historical Provider, MD  medroxyPROGESTERone (DEPO-SUBQ PROVERA) 104 MG/0.65ML injection Inject 104 mg into the skin every 3 (three) months.    Historical Provider, MD  metroNIDAZOLE (FLAGYL) 500 MG tablet Take 1 tablet (500 mg total) by mouth 2 (two) times daily. 11/07/14   Erasmo Downer, MD  Norgestimate-Ethinyl Estradiol Triphasic (ORTHO TRI-CYCLEN, 28,) 0.18/0.215/0.25 MG-35 MCG tablet Take 1 tablet by  mouth daily. 11/05/14   Erasmo Downer, MD  ondansetron (ZOFRAN ODT) 4 MG disintegrating tablet Take 1 tablet (4 mg total) by mouth every 8 (eight) hours as needed for nausea or vomiting. 02/03/14   Mellody Drown, PA-C  traMADol (ULTRAM) 50 MG tablet Take 1 tablet (50 mg total) by mouth every 6 (six) hours as needed. 02/20/14   Garlon Hatchet, PA-C   BP 118/83 mmHg  Pulse 55  Temp(Src) 97.9 F (36.6 C) (Oral)  Resp 17  SpO2 100%  LMP 05/11/2015 Physical Exam  Constitutional: She is oriented to person, place, and time. She appears  well-developed and well-nourished.  HENT:  Head: Normocephalic and atraumatic.  Right Ear: Tympanic membrane and external ear normal.  Left Ear: Tympanic membrane and external ear normal.  Nose: Nose normal.  Mouth/Throat: Oropharynx is clear and moist. No oropharyngeal exudate.  Eyes: Conjunctivae and EOM are normal. Pupils are equal, round, and reactive to light.  Scleral injection   Neck: Normal range of motion. Neck supple. No JVD present. No tracheal deviation present. No thyromegaly present.  Cardiovascular: Normal rate, regular rhythm, normal heart sounds and intact distal pulses.  Exam reveals no gallop and no friction rub.   No murmur heard. Pulmonary/Chest: Effort normal and breath sounds normal. No stridor. No respiratory distress. She has no wheezes. She has no rales. She exhibits no tenderness.  Abdominal: Soft. Bowel sounds are normal. She exhibits no distension and no mass. There is no tenderness. There is no rebound and no guarding.  Musculoskeletal: Normal range of motion. She exhibits no edema or tenderness.  Lymphadenopathy:    She has no cervical adenopathy.  Neurological: She is alert and oriented to person, place, and time. She has normal reflexes. She displays normal reflexes. No cranial nerve deficit. She exhibits normal muscle tone. Coordination normal.  Awake/alert, facies symmetric, no arm or leg drift is noted Cranial nerves 3/4/5/6/06/24/09/11/12 tested and intact.   Skin: Skin is warm and dry. No rash noted. No erythema. No pallor.  Psychiatric: She has a normal mood and affect. Her behavior is normal. Judgment and thought content normal.  Nursing note and vitals reviewed.   ED Course  Procedures   DIAGNOSTIC STUDIES: Oxygen Saturation is 97% on RA, adequate by my interpretation.    COORDINATION OF CARE: 2:50 AM Discussed treatment plan with pt at bedside and pt agreed to plan.   Labs Review Labs Reviewed  CBC WITH DIFFERENTIAL/PLATELET - Abnormal;  Notable for the following:    Lymphocytes Relative 47 (*)    All other components within normal limits  COMPREHENSIVE METABOLIC PANEL  URINALYSIS, ROUTINE W REFLEX MICROSCOPIC (NOT AT [facility])  POC URINE PREG, ED    Imaging Review No results found.   EKG Interpretation None      MDM   Final diagnoses:  None    I personally performed the services described in this documentation, which was scribed in my presence. The recorded information has been reviewed and is accurate.  29 year old female with intermittent headache and dizziness over the last 4 days.  No red flags on history, neuro exam is normal.  Headache doesn't improve with OTC medications.  I do not feel patient needs emergent imaging.  She has a primary care doctor.  She can follow-up with.  We'll prescribe Fioricet.  Patient instructed to increase fluid intake and make sure that she is eating regularly throughout the day.   Marisa Severin, MD 05/19/15 [phone]

## 2015-05-18 NOTE — ED Notes (Signed)
Pt. reports dizziness and headache onset this week , denies head injury , no emesis or fever .

## 2015-05-19 MED ORDER — BUTALBITAL-APAP-CAFFEINE 50-325-40 MG PO TABS: 1.0000 | ORAL_TABLET | Freq: Four times a day (QID) | ORAL | Status: DC | PRN

## 2015-05-19 NOTE — Discharge Instructions (Signed)
Dizziness °Dizziness is a common problem. It is a feeling of unsteadiness or light-headedness. You may feel like you are about to faint. Dizziness can lead to injury if you stumble or fall. A person of any age group can suffer from dizziness, but dizziness is more common in older adults. °CAUSES  °Dizziness can be caused by many different things, including: °· Middle ear problems. °· Standing for too long. °· Infections. °· An allergic reaction. °· Aging. °· An emotional response to something, such as the sight of blood. °· Side effects of medicines. °· Tiredness. °· Problems with circulation or blood pressure. °· Excessive use of alcohol or medicines, or illegal drug use. °· Breathing too fast (hyperventilation). °· An irregular heart rhythm (arrhythmia). °· A low red blood cell count (anemia). °· Pregnancy. °· Vomiting, diarrhea, fever, or other illnesses that cause body fluid loss (dehydration). °· Diseases or conditions such as Parkinson's disease, high blood pressure (hypertension), diabetes, and thyroid problems. °· Exposure to extreme heat. °DIAGNOSIS  °Your health care provider will ask about your symptoms, perform a physical exam, and perform an electrocardiogram (ECG) to record the electrical activity of your heart. Your health care provider may also perform other heart or blood tests to determine the cause of your dizziness. These may include: °· Transthoracic echocardiogram (TTE). During echocardiography, sound waves are used to evaluate how blood flows through your heart. °· Transesophageal echocardiogram (TEE). °· Cardiac monitoring. This allows your health care provider to monitor your heart rate and rhythm in real time. °· Holter monitor. This is a portable device that records your heartbeat and can help diagnose heart arrhythmias. It allows your health care provider to track your heart activity for several days if needed. °· Stress tests by exercise or by giving medicine that makes the heart beat  faster. °TREATMENT  °Treatment of dizziness depends on the cause of your symptoms and can vary greatly. °HOME CARE INSTRUCTIONS  °· Drink enough fluids to keep your urine clear or pale yellow. This is especially important in very hot weather. In older adults, it is also important in cold weather. °· Take your medicine exactly as directed if your dizziness is caused by medicines. When taking blood pressure medicines, it is especially important to get up slowly. °· Rise slowly from chairs and steady yourself until you feel okay. °· In the morning, first sit up on the side of the bed. When you feel okay, stand slowly while holding onto something until you know your balance is fine. °· Move your legs often if you need to stand in one place for a long time. Tighten and relax your muscles in your legs while standing. °· Have someone stay with you for 1-2 days if dizziness continues to be a problem. Do this until you feel you are well enough to stay alone. Have the person call your health care provider if he or she notices changes in you that are concerning. °· Do not drive or use heavy machinery if you feel dizzy. °· Do not drink alcohol. °SEEK IMMEDIATE MEDICAL CARE IF:  °· Your dizziness or light-headedness gets worse. °· You feel nauseous or vomit. °· You have problems talking, walking, or using your arms, hands, or legs. °· You feel weak. °· You are not thinking clearly or you have trouble forming sentences. It may take a friend or family member to notice this. °· You have chest pain, abdominal pain, shortness of breath, or sweating. °· Your vision changes. °· You notice   any bleeding. °· You have side effects from medicine that seems to be getting worse rather than better. °MAKE SURE YOU:  °· Understand these instructions. °· Will watch your condition. °· Will get help right away if you are not doing well or get worse. °Document Released: 05/29/2001 Document Revised: 12/08/2013 Document Reviewed: 06/22/2011 °Exit[employer]®  Patient Information ©2015 [employer]. This information is not intended to replace advice given to you by your health care provider. Make sure you discuss any questions you have with your health care provider. ° °General Headache Without Cause °A headache is pain or discomfort felt around the head or neck area. The specific cause of a headache may not be found. There are many causes and types of headaches. A few common ones are: °· Tension headaches. °· Migraine headaches. °· Cluster headaches. °· Chronic daily headaches. °HOME CARE INSTRUCTIONS  °· Keep all follow-up appointments with your caregiver or any specialist referral. °· Only take over-the-counter or prescription medicines for pain or discomfort as directed by your caregiver. °· Lie down in a dark, quiet room when you have a headache. °· Keep a headache journal to find out what may trigger your migraine headaches. For example, write down: °¨ What you eat and drink. °¨ How much sleep you get. °¨ Any change to your diet or medicines. °· Try massage or other relaxation techniques. °· Put ice packs or heat on the head and neck. Use these 3 to 4 times per day for 15 to 20 minutes each time, or as needed. °· Limit stress. °· Sit up straight, and do not tense your muscles. °· Quit smoking if you smoke. °· Limit alcohol use. °· Decrease the amount of caffeine you drink, or stop drinking caffeine. °· Eat and sleep on a regular schedule. °· Get 7 to 9 hours of sleep, or as recommended by your caregiver. °· Keep lights dim if bright lights bother you and make your headaches worse. °SEEK MEDICAL CARE IF:  °· You have problems with the medicines you were prescribed. °· Your medicines are not working. °· You have a change from the usual headache. °· You have nausea or vomiting. °SEEK IMMEDIATE MEDICAL CARE IF:  °· Your headache becomes severe. °· You have a fever. °· You have a stiff neck. °· You have loss of vision. °· You have muscular weakness or loss of muscle  control. °· You start losing your balance or have trouble walking. °· You feel faint or pass out. °· You have severe symptoms that are different from your first symptoms. °MAKE SURE YOU:  °· Understand these instructions. °· Will watch your condition. °· Will get help right away if you are not doing well or get worse. °Document Released: 12/03/2005 Document Revised: 02/25/2012 Document Reviewed: 12/19/2011 °ExitCare® Patient Information ©2015 [employer]. This information is not intended to replace advice given to you by your health care provider. Make sure you discuss any questions you have with your health care provider. ° °

## 2015-05-19 NOTE — ED Notes (Signed)
EDP at bedside  

## 2015-07-24 ENCOUNTER — Inpatient Hospital Stay (HOSPITAL_COMMUNITY)
Admission: AD | Admit: 2015-07-24 | Discharge: 2015-07-24 | Disposition: A | Discharge status: Home / Self Care | Payer: Medicaid Other | Source: Ambulatory Visit | Attending: Obstetrics & Gynecology | Admitting: Obstetrics & Gynecology

## 2015-07-24 ENCOUNTER — Encounter (HOSPITAL_COMMUNITY): Payer: Self-pay | Admitting: *Deleted

## 2015-07-24 ENCOUNTER — Inpatient Hospital Stay (HOSPITAL_COMMUNITY): Payer: Medicaid Other

## 2015-07-24 DIAGNOSIS — O209 Hemorrhage in early pregnancy, unspecified: Secondary | ICD-10-CM | POA: Diagnosis present

## 2015-07-24 DIAGNOSIS — B9689 Other specified bacterial agents as the cause of diseases classified elsewhere: Secondary | ICD-10-CM | POA: Diagnosis not present

## 2015-07-24 DIAGNOSIS — Z3A01 Less than 8 weeks gestation of pregnancy: Secondary | ICD-10-CM | POA: Diagnosis not present

## 2015-07-24 DIAGNOSIS — A499 Bacterial infection, unspecified: Secondary | ICD-10-CM | POA: Diagnosis not present

## 2015-07-24 DIAGNOSIS — O23591 Infection of other part of genital tract in pregnancy, first trimester: Secondary | ICD-10-CM | POA: Insufficient documentation

## 2015-07-24 DIAGNOSIS — N760 Acute vaginitis: Secondary | ICD-10-CM | POA: Insufficient documentation

## 2015-07-24 DIAGNOSIS — R109 Unspecified abdominal pain: Secondary | ICD-10-CM | POA: Diagnosis not present

## 2015-07-24 DIAGNOSIS — O9989 Other specified diseases and conditions complicating pregnancy, childbirth and the puerperium: Secondary | ICD-10-CM | POA: Diagnosis not present

## 2015-07-24 DIAGNOSIS — O26899 Other specified pregnancy related conditions, unspecified trimester: Secondary | ICD-10-CM

## 2015-07-24 LAB — CBC
HEMATOCRIT: 34 % — AB (ref 36.0–46.0)
HEMOGLOBIN: 11.7 g/dL — AB (ref 12.0–15.0)
MCH: 29.3 pg (ref 26.0–34.0)
MCHC: 34.4 g/dL (ref 30.0–36.0)
MCV: 85.2 fL (ref 78.0–100.0)
Platelets: 242 10*3/uL (ref 150–400)
RBC: 3.99 MIL/uL (ref 3.87–5.11)
RDW: 13 % (ref 11.5–15.5)
WBC: 8.2 10*3/uL (ref 4.0–10.5)

## 2015-07-24 LAB — URINALYSIS, ROUTINE W REFLEX MICROSCOPIC
Bilirubin Urine: NEGATIVE
GLUCOSE, UA: NEGATIVE mg/dL
Hgb urine dipstick: NEGATIVE
KETONES UR: NEGATIVE mg/dL
Leukocytes, UA: NEGATIVE
NITRITE: NEGATIVE
Protein, ur: NEGATIVE mg/dL
SPECIFIC GRAVITY, URINE: 1.02 (ref 1.005–1.030)
UROBILINOGEN UA: 1 mg/dL (ref 0.0–1.0)
pH: 6.5 (ref 5.0–8.0)

## 2015-07-24 LAB — WET PREP, GENITAL
Trich, Wet Prep: NONE SEEN
Yeast Wet Prep HPF POC: NONE SEEN

## 2015-07-24 LAB — POCT PREGNANCY, URINE: PREG TEST UR: POSITIVE — AB

## 2015-07-24 LAB — HIV ANTIBODY (ROUTINE TESTING W REFLEX): HIV SCREEN 4TH GENERATION: NONREACTIVE

## 2015-07-24 LAB — ABO/RH: ABO/RH(D): A POS

## 2015-07-24 LAB — HCG, QUANTITATIVE, PREGNANCY: hCG, Beta Chain, Quant, S: 17814 m[IU]/mL — ABNORMAL HIGH (ref <5)

## 2015-07-24 MED ORDER — METRONIDAZOLE 500 MG PO TABS: 500.0000 mg | ORAL_TABLET | Freq: Two times a day (BID) | ORAL | Status: DC

## 2015-07-24 NOTE — MAU Provider Note (Signed)
History     CSN: [id number]  Arrival date and time: 07/24/15 5621   First Provider Initiated Contact with Patient 07/24/15 0144      Chief Complaint  Patient presents with  . Abdominal Cramping  . Vaginal Bleeding   HPI   Ms.Katherine Lindsey is a 29 y.o. female [phone] at [redacted]w[redacted]d presenting to MAU with vaginal bleeding and abdominal cramping. Both symptom started today. The bleeding is very light and comes and goes. She has had to wear a panty line due to the spotting.  The abdominal pain comes and goes; she currently rates her pain 7/10. She took ibuprofen at 9:00 pm and this helped some. The pain is located in the lower part of her stomach and it is similar to menstrual cramping. The patient took a [employer] pregnancy test yesterday and it was positive. She wants to confirm the pregnancy today.   OB History    Gravida Para Term Preterm AB TAB SAB Ectopic Multiple Living   4 2 2  1 1  0   2      Past Medical History  Diagnosis Date  . Gallstones   . Crohn's disease in remission     Hasn't had symptoms since 2004    Past Surgical History  Procedure Laterality Date  . Induced abortion  02/2012  . Cholecystectomy  08/28/2012    Procedure: LAPAROSCOPIC CHOLECYSTECTOMY WITH INTRAOPERATIVE CHOLANGIOGRAM;  Surgeon: [name], DO;  Location: [facility] OR;  Service: General;  Laterality: N/A;  . Cesarean section N/A 06/16/2013    Procedure: CESAREAN SECTION;  Surgeon: Reva Bores, MD;  Location: [facility];  Service: Obstetrics;  Laterality: N/A;    Family History  Problem Relation Age of Onset  . Other Neg Hx   . Diabetes Neg Hx   . Heart disease Neg Hx   . Hyperlipidemia Neg Hx   . Hypertension Neg Hx   . Stroke Neg Hx     History  Substance Use Topics  . Smoking status: Never Smoker   . Smokeless tobacco: Never Used  . Alcohol Use: No    Allergies: No Known Allergies  Prescriptions prior to admission  Medication Sig Dispense Refill Last Dose  .  butalbital-acetaminophen-caffeine (FIORICET) 50-325-40 MG per tablet Take 1-2 tablets by mouth every 6 (six) hours as needed for headache. 20 tablet 0   . fluconazole (DIFLUCAN) 150 MG tablet Take 1 tablet (150 mg total) by mouth once. 1 tablet 0   . ibuprofen (ADVIL,MOTRIN) 200 MG tablet Take 400 mg by mouth every 6 (six) hours as needed for moderate pain.   Not Taking  . medroxyPROGESTERone (DEPO-SUBQ PROVERA) 104 MG/0.65ML injection Inject 104 mg into the skin every 3 (three) months.   Taking  . metroNIDAZOLE (FLAGYL) 500 MG tablet Take 1 tablet (500 mg total) by mouth 2 (two) times daily. 14 tablet 0   . Norgestimate-Ethinyl Estradiol Triphasic (ORTHO TRI-CYCLEN, 28,) 0.18/0.215/0.25 MG-35 MCG tablet Take 1 tablet by mouth daily. 1 Package 2   . ondansetron (ZOFRAN ODT) 4 MG disintegrating tablet Take 1 tablet (4 mg total) by mouth every 8 (eight) hours as needed for nausea or vomiting. 10 tablet 0 Not Taking  . traMADol (ULTRAM) 50 MG tablet Take 1 tablet (50 mg total) by mouth every 6 (six) hours as needed. 15 tablet 0 Not Taking   Results for orders placed or performed during the hospital encounter of 07/24/15 (from the past 48 hour(s))  Urinalysis, Routine w reflex microscopic (not  at [facility])     Status: None   Collection Time: 07/24/15  1:25 AM  Result Value Ref Range   Color, Urine YELLOW YELLOW   APPearance CLEAR CLEAR   Specific Gravity, Urine 1.020 1.005 - 1.030   pH 6.5 5.0 - 8.0   Glucose, UA NEGATIVE NEGATIVE mg/dL   Hgb urine dipstick NEGATIVE NEGATIVE   Bilirubin Urine NEGATIVE NEGATIVE   Ketones, ur NEGATIVE NEGATIVE mg/dL   Protein, ur NEGATIVE NEGATIVE mg/dL   Urobilinogen, UA 1.0 0.0 - 1.0 mg/dL   Nitrite NEGATIVE NEGATIVE   Leukocytes, UA NEGATIVE NEGATIVE    Comment: MICROSCOPIC NOT DONE ON URINES WITH NEGATIVE PROTEIN, BLOOD, LEUKOCYTES, NITRITE, OR GLUCOSE <1000 mg/dL.  Pregnancy, urine POC     Status: Abnormal   Collection Time: 07/24/15  1:28 AM  Result Value Ref  Range   Preg Test, Ur POSITIVE (A) NEGATIVE    Comment:        THE SENSITIVITY OF THIS METHODOLOGY IS >24 mIU/mL   Wet prep, genital     Status: Abnormal   Collection Time: 07/24/15  2:00 AM  Result Value Ref Range   Yeast Wet Prep HPF POC NONE SEEN NONE SEEN   Trich, Wet Prep NONE SEEN NONE SEEN   Clue Cells Wet Prep HPF POC MODERATE (A) NONE SEEN   WBC, Wet Prep HPF POC FEW (A) NONE SEEN    Comment: MODERATE BACTERIA SEEN  CBC     Status: Abnormal   Collection Time: 07/24/15  2:05 AM  Result Value Ref Range   WBC 8.2 4.0 - 10.5 K/uL   RBC 3.99 3.87 - 5.11 MIL/uL   Hemoglobin 11.7 (L) 12.0 - 15.0 g/dL   HCT 96.0 (L) 45.4 - 09.8 %   MCV 85.2 78.0 - 100.0 fL   MCH 29.3 26.0 - 34.0 pg   MCHC 34.4 30.0 - 36.0 g/dL   RDW 11.9 14.7 - 82.9 %   Platelets 242 150 - 400 K/uL  ABO/Rh     Status: None   Collection Time: 07/24/15  2:05 AM  Result Value Ref Range   ABO/RH(D) A POS   hCG, quantitative, pregnancy     Status: Abnormal   Collection Time: 07/24/15  2:05 AM  Result Value Ref Range   hCG, Beta Chain, Quant, S 17814 (H) <5 mIU/mL    Comment:          GEST. AGE      CONC.  (mIU/mL)   <=1 WEEK        5 - 50     2 WEEKS       50 - 500     3 WEEKS       100 - 10,000     4 WEEKS     1,000 - 30,000     5 WEEKS     3,500 - 115,000   6-8 WEEKS     12,000 - 270,000    12 WEEKS     15,000 - 220,000        FEMALE AND NON-PREGNANT FEMALE:     LESS THAN 5 mIU/mL     US Ob Comp Less 14 Wks  07/24/2015   CLINICAL DATA:  Cramping pain for 2 days  EXAM: OBSTETRIC <14 WK [place] AND TRANSVAGINAL OB US  TECHNIQUE: Both transabdominal and transvaginal ultrasound examinations were performed for complete evaluation of the gestation as well as the maternal uterus, adnexal regions, and pelvic cul-de-sac. Transvaginal technique was performed  to assess early pregnancy.  COMPARISON:  None.  FINDINGS: Intrauterine gestational sac: Single  Yolk sac:  Yes  Embryo:  No  Cardiac Activity: None  MSD: 12  mm   6  w   0  d  Maternal uterus/adnexae: Normal  IMPRESSION: Early intrauterine gestational sac and yolk sac, but no fetal pole or cardiac activity yet visualized. Recommend follow-up quantitative B-HCG levels and follow-up US in 14 days to confirm and assess viability. This recommendation follows SRU consensus guidelines: Diagnostic Criteria for Nonviable Pregnancy Early in the First Trimester. Malva Limes Med 2013; 161:0960-45.   Electronically Signed   By: Ellery Plunk M.D.   On: 07/24/2015 03:04   US Ob Transvaginal  07/24/2015   CLINICAL DATA:  Cramping pain for 2 days  EXAM: OBSTETRIC <14 WK [place] AND TRANSVAGINAL OB US  TECHNIQUE: Both transabdominal and transvaginal ultrasound examinations were performed for complete evaluation of the gestation as well as the maternal uterus, adnexal regions, and pelvic cul-de-sac. Transvaginal technique was performed to assess early pregnancy.  COMPARISON:  None.  FINDINGS: Intrauterine gestational sac: Single  Yolk sac:  Yes  Embryo:  No  Cardiac Activity: None  MSD: 12  mm   6 w   0  d  Maternal uterus/adnexae: Normal  IMPRESSION: Early intrauterine gestational sac and yolk sac, but no fetal pole or cardiac activity yet visualized. Recommend follow-up quantitative B-HCG levels and follow-up US in 14 days to confirm and assess viability. This recommendation follows SRU consensus guidelines: Diagnostic Criteria for Nonviable Pregnancy Early in the First Trimester. Malva Limes Med 2013; 409:8119-14.   Electronically Signed   By: Ellery Plunk M.D.   On: 07/24/2015 03:04    Review of Systems  Constitutional: Negative for fever and chills.  Gastrointestinal: Positive for abdominal pain. Negative for nausea and vomiting.  Genitourinary: Negative for dysuria.   Physical Exam   Blood pressure 131/63, pulse 70, temperature 98.1 F (36.7 C), temperature source Oral, resp. rate 16, height 5\' 8"  (1.727 m), weight 77.747 kg (171 lb 6.4 oz), last menstrual period 06/13/2015,  SpO2 100 %.  Physical Exam  Constitutional: She appears well-developed and well-nourished. No distress.  HENT:  Head: Normocephalic.  Eyes: Pupils are equal, round, and reactive to light.  Neck: Normal range of motion.  GI: Soft. There is generalized tenderness.  Genitourinary:  Speculum exam: Vagina - Small amount of creamy discharge, no odor Cervix - No contact bleeding Bimanual exam: Cervix closed Uterus non tender, normal size Adnexa non tender, no masses bilaterally GC/Chlam, wet prep done Chaperone present for exam.  Skin: She is not diaphoretic.    MAU Course  Procedures  None  MDM  CBC Hcg [place] Wet prep GC HIV   Assessment and Plan   A:  1. Bacterial vaginosis   2. Abdominal pain in pregnancy, antepartum   3.      SIUP @ [redacted]w[redacted]d   P:  Discharge home in stable condition RX: Flagyl Start prenatal care ASAP Return to MAU if symptoms worsen First trimester warning signs.  Ok to use tylenol as needed, as directed on the bottle.   Duane Lope, NP 07/24/2015 2:04 AM

## 2015-07-24 NOTE — MAU Note (Signed)
Patient states she has been having cramping on and off for last few days and had some light spotting today.  She is unsure of when her LMP was, but thinks it was around the last week in June.  Requesting a pregnancy test.

## 2015-07-25 LAB — GC/CHLAMYDIA PROBE AMP (~~LOC~~) NOT AT ARMC
Chlamydia: NEGATIVE
NEISSERIA GONORRHEA: NEGATIVE

## 2015-08-16 ENCOUNTER — Other Ambulatory Visit: Payer: Medicaid Other

## 2015-08-16 DIAGNOSIS — Z3481 Encounter for supervision of other normal pregnancy, first trimester: Secondary | ICD-10-CM

## 2015-08-16 NOTE — Progress Notes (Signed)
New ob labs done today Katherine Lindsey 

## 2015-08-17 LAB — OBSTETRIC PANEL
Antibody Screen: NEGATIVE
Basophils Absolute: 0 10*3/uL (ref 0.0–0.1)
Basophils Relative: 0 % (ref 0–1)
EOS PCT: 1 % (ref 0–5)
Eosinophils Absolute: 0.1 10*3/uL (ref 0.0–0.7)
HCT: 35.3 % — ABNORMAL LOW (ref 36.0–46.0)
HEP B S AG: NEGATIVE
Hemoglobin: 12.3 g/dL (ref 12.0–15.0)
LYMPHS ABS: 1.8 10*3/uL (ref 0.7–4.0)
LYMPHS PCT: 28 % (ref 12–46)
MCH: 29.6 pg (ref 26.0–34.0)
MCHC: 34.8 g/dL (ref 30.0–36.0)
MCV: 84.9 fL (ref 78.0–100.0)
MONO ABS: 0.5 10*3/uL (ref 0.1–1.0)
MONOS PCT: 8 % (ref 3–12)
MPV: 9.2 fL (ref 8.6–12.4)
NEUTROS ABS: 4 10*3/uL (ref 1.7–7.7)
Neutrophils Relative %: 63 % (ref 43–77)
PLATELETS: 268 10*3/uL (ref 150–400)
RBC: 4.16 MIL/uL (ref 3.87–5.11)
RDW: 13.1 % (ref 11.5–15.5)
RH TYPE: POSITIVE
RUBELLA: 6.29 {index} — AB (ref <0.90)
WBC: 6.3 10*3/uL (ref 4.0–10.5)

## 2015-08-17 LAB — HIV ANTIBODY (ROUTINE TESTING W REFLEX): HIV 1&2 Ab, 4th Generation: NONREACTIVE

## 2015-08-17 LAB — SICKLE CELL SCREEN: SICKLE CELL SCREEN: NEGATIVE

## 2015-08-18 LAB — CULTURE, OB URINE
Colony Count: NO GROWTH
Organism ID, Bacteria: NO GROWTH

## 2015-08-23 ENCOUNTER — Encounter: Payer: Self-pay | Admitting: Family Medicine

## 2015-08-23 ENCOUNTER — Ambulatory Visit (INDEPENDENT_AMBULATORY_CARE_PROVIDER_SITE_OTHER): Payer: Medicaid Other | Admitting: Family Medicine

## 2015-08-23 ENCOUNTER — Other Ambulatory Visit (HOSPITAL_COMMUNITY)
Admission: RE | Admit: 2015-08-23 | Discharge: 2015-08-23 | Disposition: A | Discharge status: Home / Self Care | Payer: Medicaid Other | Source: Ambulatory Visit | Attending: Family Medicine | Admitting: Family Medicine

## 2015-08-23 VITALS — BP 136/68 | HR 63 | Temp 98.5°F | Wt 164.6 lb

## 2015-08-23 DIAGNOSIS — Z3481 Encounter for supervision of other normal pregnancy, first trimester: Secondary | ICD-10-CM

## 2015-08-23 DIAGNOSIS — Z113 Encounter for screening for infections with a predominantly sexual mode of transmission: Secondary | ICD-10-CM | POA: Insufficient documentation

## 2015-08-23 DIAGNOSIS — Z3491 Encounter for supervision of normal pregnancy, unspecified, first trimester: Secondary | ICD-10-CM

## 2015-08-23 DIAGNOSIS — Z01411 Encounter for gynecological examination (general) (routine) with abnormal findings: Secondary | ICD-10-CM | POA: Diagnosis present

## 2015-08-23 DIAGNOSIS — Z3490 Encounter for supervision of normal pregnancy, unspecified, unspecified trimester: Secondary | ICD-10-CM | POA: Insufficient documentation

## 2015-08-23 MED ORDER — PRENATAL MULTIVITAMIN CH: 1.0000 | ORAL_TABLET | Freq: Every day | ORAL | Status: DC

## 2015-08-23 MED ORDER — ONDANSETRON HCL 4 MG PO TABS: 4.0000 mg | ORAL_TABLET | Freq: Three times a day (TID) | ORAL | Status: DC | PRN

## 2015-08-23 NOTE — Patient Instructions (Addendum)
Thank you for coming in today!  Congratulations on your pregnancy. You need to take prenatal vitamins each day. Continue to stay away from cigarettes! Also, avoid raw meats and cheeses, alcohol, and cat litter. You can continue to be as active as you feel comfortable being.    If you have any emergent problems you should call the clinic at [phone] or go to [facility] where they have an ER for pregnant women.   As you leave, make an appointment to follow up in 4 weeks.   Take care and seek immediate care sooner if you develop any concerns.  Please feel free to call with any questions or concerns at any time.  [name], MD,MS,  PGY2 08/23/2015 3:38 PM    Prenatal Care  WHAT IS PRENATAL CARE?  Prenatal care means health care during your pregnancy, before your baby is born. It is very important to take care of yourself and your baby during your pregnancy by:   Getting early prenatal care. If you know you are pregnant, or think you might be pregnant, call your health care provider as soon as possible. Schedule a visit for a prenatal exam.  Getting regular prenatal care. Follow your health care provider's schedule for blood and other necessary tests. Do not miss appointments.  Doing everything you can to keep yourself and your baby healthy during your pregnancy.  Getting complete care. Prenatal care should include evaluation of the medical, dietary, educational, psychological, and social needs of you and your significant other. The medical and genetic history of your family and the family of your baby's father should be discussed with your health care provider.  Discussing with your health care provider:  Prescription, over-the-counter, and herbal medicines that you take.  Any history of substance abuse, alcohol use, smoking, and illegal drug use.  Any history of domestic abuse and violence.  Immunizations you have received.  Your nutrition and diet.  The amount of exercise  you do.  Any environmental and occupational hazards to which you are exposed.  History of sexually transmitted infections for both you and your partner.  Previous pregnancies you have had. WHY IS PRENATAL CARE SO IMPORTANT?  By regularly seeing your health care provider, you help ensure that problems can be identified early so that they can be treated as soon as possible. Other problems might be prevented. Many studies have shown that early and regular prenatal care is important for the health of mothers and their babies.  HOW CAN I TAKE CARE OF MYSELF WHILE I AM PREGNANT?  Here are ways to take care of yourself and your baby:   Start or continue taking your multivitamin with 400 micrograms (mcg) of folic acid every day.  Get early and regular prenatal care. It is very important to see a health care provider during your pregnancy. Your health care provider will check at each visit to make sure that you and your baby are healthy. If there are any problems, action can be taken right away to help you and your baby.  Eat a healthy diet that includes:  Fruits.  Vegetables.  Foods low in saturated fat.  Whole grains.  Calcium-rich foods, such as milk, yogurt, and hard cheeses.  Drink 6-8 glasses of liquids a day.  Unless your health care provider tells you not to, try to be physically active for 30 minutes, most days of the week. If you are pressed for time, you can get your activity in through 10-minute segments,  three times a day.  Do not smoke, drink alcohol, or use drugs. These can cause long-term damage to your baby. Talk with your health care provider about steps to take to stop smoking. Talk with a member of your faith community, a counselor, a trusted friend, or your health care provider if you are concerned about your alcohol or drug use.  Ask your health care provider before taking any medicine, even over-the-counter medicines. Some medicines are not safe to take during  pregnancy.  Get plenty of rest and sleep.  Avoid hot tubs and saunas during pregnancy.  Do not have X-rays taken unless absolutely necessary and with the recommendation of your health care provider. A lead shield can be placed on your abdomen to protect your baby when X-rays are taken in other parts of your body.  Do not empty the cat litter when you are pregnant. It may contain a parasite that causes an infection called toxoplasmosis, which can cause birth defects. Also, use gloves when working in garden areas used by cats.  Do not eat uncooked or undercooked meats or fish.  Do not eat soft, mold-ripened cheeses (Brie, Camembert, and chevre) or soft, blue-veined cheese (Danish blue and Roquefort).  Stay away from toxic chemicals like:  Insecticides.  Solvents (some cleaners or paint thinners).  Lead.  Mercury.  Sexual intercourse may continue until the end of the pregnancy, unless you have a medical problem or there is a problem with the pregnancy and your health care provider tells you not to.  Do not wear high-heel shoes, especially during the second half of the pregnancy. You can lose your balance and fall.  Do not take long trips, unless absolutely necessary. Be sure to see your health care provider before going on the trip.  Do not sit in one position for more than 2 hours when on a trip.  Take a copy of your medical records when going on a trip. Know where a hospital is located in the city you are visiting, in case of an emergency.  Most dangerous household products will have pregnancy warnings on their labels. Ask your health care provider about products if you are unsure.  Limit or eliminate your caffeine intake from coffee, tea, sodas, medicines, and chocolate.  Many women continue working through pregnancy. Staying active might help you stay healthier. If you have a question about the safety or the hours you work at your particular job, talk with your health care  provider.  Get informed:  Read books.  Watch videos.  Go to childbirth classes for you and your significant other.  Talk with experienced moms.  Ask your health care provider about childbirth education classes for you and your partner. Classes can help you and your partner prepare for the birth of your baby.  Ask about a baby doctor (pediatrician) and methods and pain medicine for labor, delivery, and possible cesarean delivery. HOW OFTEN SHOULD I SEE MY HEALTH CARE PROVIDER DURING PREGNANCY?  Your health care provider will give you a schedule for your prenatal visits. You will have visits more often as you get closer to the end of your pregnancy. An average pregnancy lasts about 40 weeks.  A typical schedule includes visiting your health care provider:   About once each month during your first 6 months of pregnancy.  Every 2 weeks during the next 2 months.  Weekly in the last month, until the delivery date. Your health care provider will probably want to see you more  often if:  You are older than 35 years.  Your pregnancy is high risk because you have certain health problems or problems with the pregnancy, such as:  Diabetes.  High blood pressure.  The baby is not growing on schedule, according to the dates of the pregnancy. Your health care provider will do special tests to make sure you and your baby are not having any serious problems. WHAT HAPPENS DURING PRENATAL VISITS?   At your first prenatal visit, your health care provider will do a physical exam and talk to you about your health history and the health history of your partner and your family. Your health care provider will be able to tell you what date to expect your baby to be born on.  Your first physical exam will include checks of your blood pressure, measurements of your height and weight, and an exam of your pelvic organs. Your health care provider will do a Pap test if you have not had one recently and will do  cultures of your cervix to make sure there is no infection.  At each prenatal visit, there will be tests of your blood, urine, blood pressure, weight, and the progress of the baby will be checked.  At your later prenatal visits, your health care provider will check how you are doing and how your baby is developing. You may have a number of tests done as your pregnancy progresses.  Ultrasound exams are often used to check on your baby's growth and health.  You may have more urine and blood tests, as well as special tests, if needed. These may include amniocentesis to examine fluid in the pregnancy sac, stress tests to check how the baby responds to contractions, or a biophysical profile to measure your baby's well-being. Your health care provider will explain the tests and why they are necessary.  You should be tested for high blood sugar (gestational diabetes) between the 24th and 28th weeks of your pregnancy.  You should discuss with your health care provider your plans to breastfeed or bottle-feed your baby.  Each visit is also a chance for you to learn about staying healthy during pregnancy and to ask questions. Document Released: 12/06/2003 Document Revised: 12/08/2013 Document Reviewed: 02/17/2014 [facility] Patient Information 2015 [place], [place]. This information is not intended to replace advice given to you by your health care provider. Make sure you discuss any questions you have with your health care provider.

## 2015-08-23 NOTE — Progress Notes (Signed)
Katherine Lindsey is a 29 y.o. yo [phone] at [redacted]w[redacted]d who presents for her initial prenatal visit.   Pregnancy  is not planned (but did not come a surprise either) She reports breast tenderness, fatigue, nausea and positive home pregnancy test. She  is notTaking PNV >> but would like a prescription for one today. See flow sheet for details.  PMH, POBH, FH, meds, allergies and Social Hx reviewed.  Prenatal exam: Gen: Well nourished, well developed.  No distress.  Vitals noted. HEENT: Normocephalic, atraumatic.  Neck supple without cervical lymphadenopathy, thyromegaly or thyroid nodules.  fair dentition. CV: RRR no murmur, gallops or rubs Lungs: CTA B.  Normal respiratory effort without wheezes or rales. Abd: soft, NTND. +BS.  Uterus not appreciated above pelvis. GU: Normal external female genitalia without lesions.  Nl vaginal, well rugated without lesions. No vaginal discharge.  Bimanual exam: No adnexal mass or TTP. No CMT.  Uterus size appropriate for gestational age. Ext: No clubbing, cyanosis or edema. Psych: Normal grooming and dress.  Not depressed or anxious appearing.  Normal thought content and process without flight of ideas or looseness of associations   Assessment/plan: 1) Pregnancy [redacted]w[redacted]d doing well.  Current pregnancy issues include Nausea Dating is reliable Prenatal labs reviewed. >> others ordered Bleeding and pain precautions reviewed. Importance of prenatal vitamins reviewed.  Genetic screening offered.  Early glucola is not indicated.    Follow up 4 weeks.  Katherine Delton, MD,MS,  PGY2 08/23/2015 3:42 PM

## 2015-08-25 LAB — CYTOLOGY - PAP

## 2015-09-06 ENCOUNTER — Encounter (HOSPITAL_COMMUNITY): Payer: Self-pay

## 2015-09-06 ENCOUNTER — Inpatient Hospital Stay (HOSPITAL_COMMUNITY)
Admission: AD | Admit: 2015-09-06 | Discharge: 2015-09-06 | Disposition: A | Discharge status: Home / Self Care | Payer: Medicaid Other | Source: Ambulatory Visit | Attending: Obstetrics & Gynecology | Admitting: Obstetrics & Gynecology

## 2015-09-06 DIAGNOSIS — N949 Unspecified condition associated with female genital organs and menstrual cycle: Secondary | ICD-10-CM

## 2015-09-06 DIAGNOSIS — O4691 Antepartum hemorrhage, unspecified, first trimester: Secondary | ICD-10-CM

## 2015-09-06 DIAGNOSIS — R102 Pelvic and perineal pain: Secondary | ICD-10-CM | POA: Insufficient documentation

## 2015-09-06 DIAGNOSIS — K5900 Constipation, unspecified: Secondary | ICD-10-CM | POA: Insufficient documentation

## 2015-09-06 DIAGNOSIS — Z3A12 12 weeks gestation of pregnancy: Secondary | ICD-10-CM | POA: Insufficient documentation

## 2015-09-06 DIAGNOSIS — K5901 Slow transit constipation: Secondary | ICD-10-CM

## 2015-09-06 DIAGNOSIS — O209 Hemorrhage in early pregnancy, unspecified: Secondary | ICD-10-CM

## 2015-09-06 DIAGNOSIS — R1030 Lower abdominal pain, unspecified: Secondary | ICD-10-CM | POA: Insufficient documentation

## 2015-09-06 DIAGNOSIS — O26891 Other specified pregnancy related conditions, first trimester: Secondary | ICD-10-CM | POA: Diagnosis not present

## 2015-09-06 LAB — URINALYSIS, ROUTINE W REFLEX MICROSCOPIC
Bilirubin Urine: NEGATIVE
GLUCOSE, UA: NEGATIVE mg/dL
HGB URINE DIPSTICK: NEGATIVE
KETONES UR: NEGATIVE mg/dL
Leukocytes, UA: NEGATIVE
Nitrite: NEGATIVE
PROTEIN: NEGATIVE mg/dL
Specific Gravity, Urine: 1.01 (ref 1.005–1.030)
Urobilinogen, UA: 0.2 mg/dL (ref 0.0–1.0)
pH: 7 (ref 5.0–8.0)

## 2015-09-06 NOTE — Discharge Instructions (Signed)
Constipation Constipation is when a person:  Poops (has a bowel movement) less than 3 times a week.  Has a hard time pooping.  Has poop that is dry, hard, or bigger than normal. HOME CARE   Eat foods with a lot of fiber in them. This includes fruits, vegetables, beans, and whole grains such as brown rice.  Avoid fatty foods and foods with a lot of sugar. This includes french fries, hamburgers, cookies, candy, and soda.  If you are not getting enough fiber from food, take products with added fiber in them (supplements).  Drink enough fluid to keep your pee (urine) clear or pale yellow.  Exercise on a regular basis, or as told by your doctor.  Go to the restroom when you feel like you need to poop. Do not hold it.  Only take medicine as told by your doctor. Do not take medicines that help you poop (laxatives) without talking to your doctor first. GET HELP RIGHT AWAY IF:   You have bright red blood in your poop (stool).  Your constipation lasts more than 4 days or gets worse.  You have belly (abdominal) or butt (rectal) pain.  You have thin poop (as thin as a pencil).  You lose weight, and it cannot be explained. MAKE SURE YOU:   Understand these instructions.  Will watch your condition.  Will get help right away if you are not doing well or get worse. Document Released: 05/21/2008 Document Revised: 12/08/2013 Document Reviewed: 09/14/2013 [facility] Patient Information 2015 [place], [place]. This information is not intended to replace advice given to you by your health care provider. Make sure you discuss any questions you have with your health care provider.  Round Ligament Pain During Pregnancy   Round ligament pain is a sharp pain or jabbing feeling often felt in the lower belly or groin area on one or both sides. It is one of the most common complaints during pregnancy and is considered a normal part of pregnancy. It is most often felt during the second trimester.    Here is what you need to know about round ligament pain, including some tips to help you feel better.   Causes of Round Ligament Pain:    Several thick ligaments surround and support your womb (uterus) as it grows during pregnancy. One of them is called the round ligament.   The round ligament connects the front part of the womb to your groin, the area where your legs attach to your pelvis. The round ligament normally tightens and relaxes slowly.   As your baby and womb grow, the round ligament stretches. That makes it more likely to become strained.   Sudden movements can cause the ligament to tighten quickly, like a rubber band snapping. This causes a sudden and quick jabbing feeling.   Symptoms of Round Ligament Pain   Round ligament pain can be concerning and uncomfortable. But it is considered normal as your body changes during pregnancy.   The symptoms of round ligament pain include a sharp, sudden spasm in the belly. It usually affects the right side, but it may happen on both sides. The pain only lasts a few seconds.   Exercise may cause the pain, as will rapid movements such as:   sneezing  coughing  laughing  rolling over in bed  standing up too quickly   Treatment of Round Ligament Pain   Here are some tips that may help reduce your discomfort:   Pain relief. Take over-the-counter acetaminophen  for pain, if necessary. Ask your doctor if this is OK.   Exercise. Get plenty of exercise to keep your stomach (core) muscles strong. Doing stretching exercises or prenatal yoga can be helpful. Ask your doctor which exercises are safe for you and your baby.   A helpful exercise involves putting your hands and knees on the floor, lowering your head, and pushing your backside into the air.   Avoid sudden movements. Change positions slowly (such as standing up or sitting down) to avoid sudden movements that may cause stretching and pain.   Flex your hips. Bend and flex your  hips before you cough, sneeze, or laugh to avoid pulling on the ligaments.   Apply warmth. A heating pad or warm bath may be helpful. Ask your doctor if this is OK. Extreme heat can be dangerous to the baby.   You should try to modify your daily activity level and avoid positions that may worsen the condition.   When to Call the Doctor/Midwife   Always tell your doctor or midwife about any type of pain you have during pregnancy. Round ligament pain is quick and doesn't last long.   Call your health care provider immediately if you have:   severe pain  fever  chills  pain on urination  difficulty walking   Belly pain during pregnancy can be due to many different causes. It is important for your doctor to rule out more serious conditions, including pregnancy complications such as placenta abruption or non-pregnancy illnesses such as:   inguinal hernia  appendicitis  stomach, liver, and kidney problems  Preterm labor pains may sometimes be mistaken for round ligament pain.

## 2015-09-06 NOTE — MAU Note (Signed)
Pt reports she has been constipated and was straining to have a BM and noticed blood in the toilet. Reports she is having lower abd pain now.

## 2015-09-06 NOTE — MAU Provider Note (Signed)
History     CSN: [id number]  Arrival date and time: 09/06/15 4098   First Provider Initiated Contact with Patient 09/06/15 2148      No chief complaint on file.  HPI  Katherine Lindsey is a 29 y.o. [phone] at [redacted]w[redacted]d who presents to MAU today with complaint of lower abdominal pain and vaginal and rectal bleeding earlier today. She states that she has constipation. She has been straining to have BM. Today when she strained she noted blood with wiping. She believes that blood is both rectal and vaginal. She states continued lower abdominal pain that is bilateral although worse in the LLQ. She states that pain is mild to moderate. She has not taken anything for pain. She has seen minimal blood with wiping since onset and denies blood on a pad or in her underwear. She denies fever. She has had some nausea without vomiting. She denies UTI symptoms. She has not taken anything for constipation.   OB History    Gravida Para Term Preterm AB TAB SAB Ectopic Multiple Living   0   2      Past Medical History  Diagnosis Date  . Gallstones   . Crohn's disease in remission     Hasn't had symptoms since 2004    Past Surgical History  Procedure Laterality Date  . Induced abortion  02/2012  . Cholecystectomy  08/28/2012    Procedure: LAPAROSCOPIC CHOLECYSTECTOMY WITH INTRAOPERATIVE CHOLANGIOGRAM;  Surgeon: [name], DO;  Location: [facility] OR;  Service: General;  Laterality: N/A;  . Cesarean section N/A 06/16/2013    Procedure: CESAREAN SECTION;  Surgeon: Reva Bores, MD;  Location: [facility] ORS;  Service: Obstetrics;  Laterality: N/A;    Family History  Problem Relation Age of Onset  . Other Neg Hx   . Diabetes Neg Hx   . Heart disease Neg Hx   . Hyperlipidemia Neg Hx   . Hypertension Neg Hx   . Stroke Neg Hx     Social History  Substance Use Topics  . Smoking status: Never Smoker   . Smokeless tobacco: Never Used  . Alcohol Use: No    Allergies: No Known  Allergies  Prescriptions prior to admission  Medication Sig Dispense Refill Last Dose  . acetaminophen (TYLENOL) 500 MG tablet Take 1,000 mg by mouth every 6 (six) hours as needed for moderate pain.   09/03/2015 at Unknown time  . ondansetron (ZOFRAN) 4 MG tablet Take 1 tablet (4 mg total) by mouth every 8 (eight) hours as needed for nausea or vomiting. 20 tablet 0 Past Week at Unknown time  . Prenatal Vit-Fe Fumarate-FA (PRENATAL MULTIVITAMIN) TABS tablet Take 1 tablet by mouth daily at 12 noon. 90 tablet 3 09/06/2015 at Unknown time    Review of Systems  Constitutional: Negative for fever and malaise/fatigue.  Gastrointestinal: Positive for nausea, abdominal pain, constipation and blood in stool. Negative for vomiting and diarrhea.  Genitourinary: Negative for dysuria, urgency and frequency.       + vaginal bleeding Neg vaginal discharge   Physical Exam   Blood pressure 125/56, pulse 75, temperature 98.7 F (37.1 C), temperature source Oral, resp. rate 16, height  (1.727 m), weight 165 lb (74.844 kg), last menstrual period 06/13/2015, SpO2 100 %.  Physical Exam  Nursing note and vitals reviewed. Constitutional: She is oriented to person, place, and time. She appears well-developed and well-nourished. No distress.  HENT:  Head: Normocephalic and atraumatic.  Cardiovascular: Normal rate.   Respiratory: Effort normal.  GI: Soft. She exhibits no distension and no mass. There is no tenderness. There is no rebound and no guarding.  Genitourinary: Uterus is enlarged (appropriate for GA). Uterus is not tender. Cervix exhibits no motion tenderness, no discharge and no friability. No bleeding in the vagina. No vaginal discharge found.  Cervix: closed, thick  Neurological: She is alert and oriented to person, place, and time.  Skin: Skin is warm and dry. No erythema.  Psychiatric: She has a normal mood and affect.   Results for orders placed or performed during the hospital encounter of  09/06/15 (from the past 24 hour(s))  Urinalysis, Routine w reflex microscopic (not at [facility])     Status: None   Collection Time: 09/06/15  7:12 PM  Result Value Ref Range   Color, Urine YELLOW YELLOW   APPearance CLEAR CLEAR   Specific Gravity, Urine 1.010 1.005 - 1.030   pH 7.0 5.0 - 8.0   Glucose, UA NEGATIVE NEGATIVE mg/dL   Hgb urine dipstick NEGATIVE NEGATIVE   Bilirubin Urine NEGATIVE NEGATIVE   Ketones, ur NEGATIVE NEGATIVE mg/dL   Protein, ur NEGATIVE NEGATIVE mg/dL   Urobilinogen, UA 0.2 0.0 - 1.0 mg/dL   Nitrite NEGATIVE NEGATIVE   Leukocytes, UA NEGATIVE NEGATIVE     MAU Course  Procedures None  MDM UA today No blood in vagina on exam FHR - 159 bpm with doppler Limited US performed by Dorathy Kinsman, CNM shows normal appearing fetus with FHR and movement.   Assessment and Plan  A: [facility] at [redacted]w[redacted]d Constipation Round ligament pain  P: Discharge home Tylenol PRN for pain Advised to start regimen of Miralax and Colace PRN for constipation Increased PO hydration as tolerated Second trimetser precautions discussed Patient advised to follow-up with [facility] as scheduled for routine prenatal care Patient may return to MAU as needed or if her condition were to change or worsen   Marny Lowenstein, PA-C  09/06/2015, 9:48 PM

## 2015-09-13 ENCOUNTER — Encounter (HOSPITAL_COMMUNITY): Payer: Self-pay | Admitting: *Deleted

## 2015-09-13 ENCOUNTER — Inpatient Hospital Stay (HOSPITAL_COMMUNITY): Payer: Medicaid Other

## 2015-09-13 ENCOUNTER — Inpatient Hospital Stay (HOSPITAL_COMMUNITY)
Admission: AD | Admit: 2015-09-13 | Discharge: 2015-09-13 | Disposition: A | Discharge status: Home / Self Care | Payer: Medicaid Other | Source: Ambulatory Visit | Attending: Obstetrics and Gynecology | Admitting: Obstetrics and Gynecology

## 2015-09-13 DIAGNOSIS — R109 Unspecified abdominal pain: Secondary | ICD-10-CM | POA: Insufficient documentation

## 2015-09-13 DIAGNOSIS — Z3A13 13 weeks gestation of pregnancy: Secondary | ICD-10-CM | POA: Diagnosis not present

## 2015-09-13 DIAGNOSIS — O209 Hemorrhage in early pregnancy, unspecified: Secondary | ICD-10-CM | POA: Insufficient documentation

## 2015-09-13 DIAGNOSIS — O4691 Antepartum hemorrhage, unspecified, first trimester: Secondary | ICD-10-CM | POA: Diagnosis not present

## 2015-09-13 DIAGNOSIS — N760 Acute vaginitis: Secondary | ICD-10-CM | POA: Insufficient documentation

## 2015-09-13 DIAGNOSIS — O23591 Infection of other part of genital tract in pregnancy, first trimester: Secondary | ICD-10-CM | POA: Diagnosis not present

## 2015-09-13 DIAGNOSIS — Z3491 Encounter for supervision of normal pregnancy, unspecified, first trimester: Secondary | ICD-10-CM

## 2015-09-13 DIAGNOSIS — B9689 Other specified bacterial agents as the cause of diseases classified elsewhere: Secondary | ICD-10-CM | POA: Diagnosis not present

## 2015-09-13 LAB — WET PREP, GENITAL
Clue Cells Wet Prep HPF POC: NONE SEEN
Trich, Wet Prep: NONE SEEN
Yeast Wet Prep HPF POC: NONE SEEN

## 2015-09-13 LAB — URINALYSIS, ROUTINE W REFLEX MICROSCOPIC
Bilirubin Urine: NEGATIVE
Glucose, UA: NEGATIVE mg/dL
Hgb urine dipstick: NEGATIVE
KETONES UR: 40 mg/dL — AB
LEUKOCYTES UA: NEGATIVE
NITRITE: NEGATIVE
PROTEIN: NEGATIVE mg/dL
Specific Gravity, Urine: 1.02 (ref 1.005–1.030)
UROBILINOGEN UA: 0.2 mg/dL (ref 0.0–1.0)
pH: 7.5 (ref 5.0–8.0)
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
## 2015-09-13 NOTE — Progress Notes (Signed)
Wtitten and verbal d/c instructions given and understanding voiced

## 2015-09-13 NOTE — MAU Note (Signed)
Urine in lab 

## 2015-09-13 NOTE — MAU Note (Signed)
Cramping in lower abd started this morning, is off and on.  Following intercourse, noted some spotting

## 2015-09-19 ENCOUNTER — Encounter: Payer: Medicaid Other | Admitting: Family Medicine

## 2015-09-19 ENCOUNTER — Ambulatory Visit (INDEPENDENT_AMBULATORY_CARE_PROVIDER_SITE_OTHER): Payer: Medicaid Other | Admitting: Family Medicine

## 2015-09-19 VITALS — BP 123/65 | HR 69 | Temp 98.5°F | Wt 160.2 lb

## 2015-09-19 DIAGNOSIS — Z331 Pregnant state, incidental: Secondary | ICD-10-CM

## 2015-09-19 DIAGNOSIS — Z3492 Encounter for supervision of normal pregnancy, unspecified, second trimester: Secondary | ICD-10-CM

## 2015-09-19 MED ORDER — VITAMIN B-6 25 MG PO TABS: 25.0000 mg | ORAL_TABLET | Freq: Every day | ORAL | Status: DC

## 2015-09-19 NOTE — Progress Notes (Signed)
29yo female presenting today at 56weeks 0days for prenatal care. Reports decreased weight; chart review shows decreased weight of four pounds over last four weeks. Reports nausea unrelieved by zofran and decreased appetite. Denies fevers, diarrhea, abdominal pain. Denies vaginal bleeding or discharge. Denies contractions. Has not yet noted fetal movement. Recommended small meals and prescribed vitamin B6 to help with nausea and weight loss. Would like quad screen done--instructed to schedule lab visit in 2-3 weeks to have this drawn and to check weight. Follow up in 4 weeks.

## 2015-09-19 NOTE — Patient Instructions (Signed)
Please schedule an appointment in 2-3 weeks for your quad screen and a weight check.   Second Trimester of Pregnancy The second trimester is from week 13 through week 28, months 4 through 6. The second trimester is often a time when you feel your best. Your body has also adjusted to being pregnant, and you begin to feel better physically. Usually, morning sickness has lessened or quit completely, you may have more energy, and you may have an increase in appetite. The second trimester is also a time when the fetus is growing rapidly. At the end of the sixth month, the fetus is about 9 inches long and weighs about 1 pounds. You will likely begin to feel the baby move (quickening) between 18 and 20 weeks of the pregnancy. BODY CHANGES Your body goes through many changes during pregnancy. The changes vary from woman to woman.   Your weight will continue to increase. You will notice your lower abdomen bulging out.  You may begin to get stretch marks on your hips, abdomen, and breasts.  You may develop headaches that can be relieved by medicines approved by your health care provider.  You may urinate more often because the fetus is pressing on your bladder.  You may develop or continue to have heartburn as a result of your pregnancy.  You may develop constipation because certain hormones are causing the muscles that push waste through your intestines to slow down.  You may develop hemorrhoids or swollen, bulging veins (varicose veins).  You may have back pain because of the weight gain and pregnancy hormones relaxing your joints between the bones in your pelvis and as a result of a shift in weight and the muscles that support your balance.  Your breasts will continue to grow and be tender.  Your gums may bleed and may be sensitive to brushing and flossing.  Dark spots or blotches (chloasma, mask of pregnancy) may develop on your face. This will likely fade after the baby is born.  A dark line  from your belly button to the pubic area (linea nigra) may appear. This will likely fade after the baby is born.  You may have changes in your hair. These can include thickening of your hair, rapid growth, and changes in texture. Some women also have hair loss during or after pregnancy, or hair that feels dry or thin. Your hair will most likely return to normal after your baby is born. WHAT TO EXPECT AT YOUR PRENATAL VISITS During a routine prenatal visit:  You will be weighed to make sure you and the fetus are growing normally.  Your blood pressure will be taken.  Your abdomen will be measured to track your baby's growth.  The fetal heartbeat will be listened to.  Any test results from the previous visit will be discussed. Your health care provider may ask you:  How you are feeling.  If you are feeling the baby move.  If you have had any abnormal symptoms, such as leaking fluid, bleeding, severe headaches, or abdominal cramping.  If you have any questions. Other tests that may be performed during your second trimester include:  Blood tests that check for:  Low iron levels (anemia).  Gestational diabetes (between 24 and 28 weeks).  Rh antibodies.  Urine tests to check for infections, diabetes, or protein in the urine.  An ultrasound to confirm the proper growth and development of the baby.  An amniocentesis to check for possible genetic problems.  Fetal screens for  spina bifida and Down syndrome. HOME CARE INSTRUCTIONS   Avoid all smoking, herbs, alcohol, and unprescribed drugs. These chemicals affect the formation and growth of the baby.  Follow your health care provider's instructions regarding medicine use. There are medicines that are either safe or unsafe to take during pregnancy.  Exercise only as directed by your health care provider. Experiencing uterine cramps is a good sign to stop exercising.  Continue to eat regular, healthy meals.  Wear a good support  bra for breast tenderness.  Do not use hot tubs, steam rooms, or saunas.  Wear your seat belt at all times when driving.  Avoid raw meat, uncooked cheese, cat litter boxes, and soil used by cats. These carry germs that can cause birth defects in the baby.  Take your prenatal vitamins.  Try taking a stool softener (if your health care provider approves) if you develop constipation. Eat more high-fiber foods, such as fresh vegetables or fruit and whole grains. Drink plenty of fluids to keep your urine clear or pale yellow.  Take warm sitz baths to soothe any pain or discomfort caused by hemorrhoids. Use hemorrhoid cream if your health care provider approves.  If you develop varicose veins, wear support hose. Elevate your feet for 15 minutes, 3-4 times a day. Limit salt in your diet.  Avoid heavy lifting, wear low heel shoes, and practice good posture.  Rest with your legs elevated if you have leg cramps or low back pain.  Visit your dentist if you have not gone yet during your pregnancy. Use a soft toothbrush to brush your teeth and be gentle when you floss.  A sexual relationship may be continued unless your health care provider directs you otherwise.  Continue to go to all your prenatal visits as directed by your health care provider. SEEK MEDICAL CARE IF:   You have dizziness.  You have mild pelvic cramps, pelvic pressure, or nagging pain in the abdominal area.  You have persistent nausea, vomiting, or diarrhea.  You have a bad smelling vaginal discharge.  You have pain with urination. SEEK IMMEDIATE MEDICAL CARE IF:   You have a fever.  You are leaking fluid from your vagina.  You have spotting or bleeding from your vagina.  You have severe abdominal cramping or pain.  You have rapid weight gain or loss.  You have shortness of breath with chest pain.  You notice sudden or extreme swelling of your face, hands, ankles, feet, or legs.  You have not felt your baby  move in over an hour.  You have severe headaches that do not go away with medicine.  You have vision changes. Document Released: 11/27/2001 Document Revised: 12/08/2013 Document Reviewed: 02/03/2013 [facility] Patient Information 2015 [place], [place]. This information is not intended to replace advice given to you by your health care provider. Make sure you discuss any questions you have with your health care provider.  Medicines During Pregnancy During pregnancy, there are medicines that are either safe or unsafe to take. Medicines include prescriptions from your caregiver, over-the-counter medicines, topical creams applied to the skin, and all herbal substances. Medicines are put into either Class A, B, C, or D. Class A and B medicines have been shown to be safe in pregnancy. Class C medicines are also considered to be safe in pregnancy, but these medicines should only be used when necessary. Class D medicines should not be used at all in pregnancy. They can be harmful to a baby.  It is best  to take as little medicine as possible while pregnant. However, some medicines are necessary to take for the mother and baby's health. Sometimes, it is more dangerous to stop taking certain medicines than to stay on them. This is often the case for people with long-term (chronic) conditions such as asthma, diabetes, or high blood pressure (hypertension). If you are pregnant and have a chronic illness, call your caregiver right away. Bring a list of your medicines and their doses to your appointments. If you are planning to become pregnant, schedule a doctor's appointment and discuss your medicines with your caregiver. Lastly, write down the phone number to your pharmacist. They can answer questions regarding a medicine's class and safety. They cannot give advice as to whether you should or should not be on a medicine.  SAFE AND UNSAFE MEDICINES There is a long list of medicines that are considered safe in pregnancy.  Below is a shorter list. For specific medicines, ask your caregiver.  AllergyMedicines Loratadine, cetirizine, and chlorpheniramine are safe to take. Certain nasal steroid sprays are safe. Talk to your caregiver about specific brands that are safe. Analgesics Acetaminophen and acetaminophen with codeine are safe to take. All other nonsteroidal anti-inflammatory drugs (NSAIDS) are not safe. This includes ibuprofen.  Antacids Many over-the-counter antacids are safe to take. Talk to your caregiver about specific brands that are safe. Famotidine, ranitidine, and lansoprazole are safe. Omepresole is considered safe to take in the second trimester. Antibiotic Medicines There are several antibiotics to avoid. These include, but are not limited to, tetracyline, quinolones, and sulfa medications. Talk to your caregiver before taking any antibiotic.  Antihistamines Talk to your caregiver about specific brands that are safe.  Asthma Medicines Most asthma steroid inhalers are safe to take. Talk to your caregiver for specific details. Calcium Calcium supplements are safe to take. Do not take oyster shell calcium.  Cough and Cold Medicines It is safe to take products with guaifenesin or dextromethorphan. Talk to your caregiver about specific brands that are safe. It is not safe to take products that contain aspirin or ibuprofen. Decongestant Medicines Pseudoephedrine-containing products are safe to take in the second and third trimester.  Depression Medicines Talk about these medicines with your caregiver.  Antidiarrheal Medicines It is safe to take loperamide. Talk to your caregiver about specific brands that are safe. It is not safe to take any antidiarrheal medicine that contains bismuth. Eyedrops Allergy eyedrops should be limited.  Iron It is safe to use certain iron-containing medicines for anemia in pregnancy. They require a prescription.  Antinausea Medicines It is safe to take  doxylamine and vitamin B6 as directed. There are other prescription medicines available, if needed.  Sleep aids It is safe to take diphenhydramine and acetaminophen with diphenhydramine.  Steroids Hydrocortisone creams are safe to use as directed. Oral steroids require a prescription. It is not safe to take any hemorrhoid cream with pramoxine or phenylephrine. Stool softener It is safe to take stool softener medicines. Avoid daily or prolonged use of stool softeners. Thyroid Medicine It is important to stay on this thyroid medicine. It needs to be followed by your caregiver.  Vaginal Medicines Your caregiver will prescribe a medicine to you if you have a vaginal infection. Certain antifungal medicines are safe to use if you have a sexually transmitted infection (STI). Talk to your caregiver.  Document Released: 12/03/2005 Document Revised: 02/25/2012 Document Reviewed: 12/04/2011 [facility] Patient Information 2015 [place], [place]. This information is not intended to replace advice given to you  by your health care provider. Make sure you discuss any questions you have with your health care provider.

## 2015-10-05 ENCOUNTER — Other Ambulatory Visit: Payer: Self-pay | Admitting: Family Medicine

## 2015-10-05 ENCOUNTER — Telehealth: Payer: Self-pay | Admitting: Family Medicine

## 2015-10-05 DIAGNOSIS — Z3492 Encounter for supervision of normal pregnancy, unspecified, second trimester: Secondary | ICD-10-CM

## 2015-10-05 NOTE — Telephone Encounter (Signed)
Being being followed by [facility] for pregnancy but seeing [facility] on 11/4 for prenatal visit. Will forward to them both.

## 2015-10-05 NOTE — Telephone Encounter (Signed)
Pt called and wanted to see if the doctor can order her [place] for 10/21/15 after her appointment here. She will be 18 weeks by then. Myriam Jacobson

## 2015-10-05 NOTE — Telephone Encounter (Signed)
This has been ordered.  Can you please schedule this for patient?

## 2015-10-05 NOTE — Telephone Encounter (Signed)
Tried calling patient, no answer, voicemail not set up. If patient calls back please inform her for the following: Ultrasound scheduled at [facility] for 11/4 at 730 am (this was the only available appointment for that day).

## 2015-10-06 ENCOUNTER — Other Ambulatory Visit: Payer: Medicaid Other

## 2015-10-16 ENCOUNTER — Inpatient Hospital Stay (HOSPITAL_COMMUNITY)
Admission: AD | Admit: 2015-10-16 | Discharge: 2015-10-16 | Disposition: A | Discharge status: Home / Self Care | Payer: Medicaid Other | Source: Ambulatory Visit | Attending: Family Medicine | Admitting: Family Medicine

## 2015-10-16 ENCOUNTER — Encounter (HOSPITAL_COMMUNITY): Payer: Self-pay

## 2015-10-16 DIAGNOSIS — O26892 Other specified pregnancy related conditions, second trimester: Secondary | ICD-10-CM | POA: Insufficient documentation

## 2015-10-16 DIAGNOSIS — R109 Unspecified abdominal pain: Secondary | ICD-10-CM | POA: Diagnosis present

## 2015-10-16 DIAGNOSIS — Z3A17 17 weeks gestation of pregnancy: Secondary | ICD-10-CM | POA: Insufficient documentation

## 2015-10-16 DIAGNOSIS — O9989 Other specified diseases and conditions complicating pregnancy, childbirth and the puerperium: Secondary | ICD-10-CM

## 2015-10-16 DIAGNOSIS — O26899 Other specified pregnancy related conditions, unspecified trimester: Secondary | ICD-10-CM

## 2015-10-16 LAB — URINALYSIS, ROUTINE W REFLEX MICROSCOPIC
BILIRUBIN URINE: NEGATIVE
Glucose, UA: NEGATIVE mg/dL
Hgb urine dipstick: NEGATIVE
KETONES UR: NEGATIVE mg/dL
Leukocytes, UA: NEGATIVE
NITRITE: NEGATIVE
PROTEIN: NEGATIVE mg/dL
Specific Gravity, Urine: 1.015 (ref 1.005–1.030)
UROBILINOGEN UA: 1 mg/dL (ref 0.0–1.0)
pH: 6.5 (ref 5.0–8.0)
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
## 2015-10-16 NOTE — Discharge Instructions (Signed)
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
## 2015-10-19 NOTE — Telephone Encounter (Signed)
Pt is requesting the results from her last visit, is at work and won't be able to answer, would like pcp to leave results for a nurse and she will call back later to get them.

## 2015-10-20 NOTE — Telephone Encounter (Signed)
It looks like she has seen Dr. Wende Mott and Dr. Caroleen Hamman for prenatal care.  Will forward to them.  Erasmo Downer, MD, MPH PGY-2,  [facility] 10/20/2015 9:51 AM

## 2015-10-21 ENCOUNTER — Other Ambulatory Visit: Payer: Self-pay | Admitting: Family Medicine

## 2015-10-21 ENCOUNTER — Ambulatory Visit (INDEPENDENT_AMBULATORY_CARE_PROVIDER_SITE_OTHER): Payer: Medicaid Other | Admitting: Family Medicine

## 2015-10-21 ENCOUNTER — Ambulatory Visit (HOSPITAL_COMMUNITY): Payer: Medicaid Other

## 2015-10-21 ENCOUNTER — Ambulatory Visit (HOSPITAL_COMMUNITY)
Admission: RE | Admit: 2015-10-21 | Discharge: 2015-10-21 | Disposition: A | Discharge status: Home / Self Care | Payer: Medicaid Other | Source: Ambulatory Visit | Attending: Family Medicine | Admitting: Family Medicine

## 2015-10-21 VITALS — BP 106/58 | HR 78 | Temp 98.5°F | Wt 163.8 lb

## 2015-10-21 DIAGNOSIS — Z3492 Encounter for supervision of normal pregnancy, unspecified, second trimester: Secondary | ICD-10-CM | POA: Diagnosis not present

## 2015-10-21 DIAGNOSIS — Z363 Encounter for antenatal screening for malformations: Secondary | ICD-10-CM

## 2015-10-21 DIAGNOSIS — Z3402 Encounter for supervision of normal first pregnancy, second trimester: Secondary | ICD-10-CM

## 2015-10-21 DIAGNOSIS — Z3A18 18 weeks gestation of pregnancy: Secondary | ICD-10-CM

## 2015-10-21 DIAGNOSIS — Z36 Encounter for antenatal screening of mother: Secondary | ICD-10-CM | POA: Insufficient documentation

## 2015-10-21 NOTE — Patient Instructions (Signed)
Thank you so much for coming to visit today! You should be contacted concerning scheduling an ultrasound in 3-4 weeks. Please follow up in four weeks for your next ob visit.  Thanks again! Dr. Caroleen Hamman  Second Trimester of Pregnancy The second trimester is from week 13 through week 28, months 4 through 6. The second trimester is often a time when you feel your best. Your body has also adjusted to being pregnant, and you begin to feel better physically. Usually, morning sickness has lessened or quit completely, you may have more energy, and you may have an increase in appetite. The second trimester is also a time when the fetus is growing rapidly. At the end of the sixth month, the fetus is about 9 inches long and weighs about 1 pounds. You will likely begin to feel the baby move (quickening) between 18 and 20 weeks of the pregnancy. BODY CHANGES Your body goes through many changes during pregnancy. The changes vary from woman to woman.   Your weight will continue to increase. You will notice your lower abdomen bulging out.  You may begin to get stretch marks on your hips, abdomen, and breasts.  You may develop headaches that can be relieved by medicines approved by your health care provider.  You may urinate more often because the fetus is pressing on your bladder.  You may develop or continue to have heartburn as a result of your pregnancy.  You may develop constipation because certain hormones are causing the muscles that push waste through your intestines to slow down.  You may develop hemorrhoids or swollen, bulging veins (varicose veins).  You may have back pain because of the weight gain and pregnancy hormones relaxing your joints between the bones in your pelvis and as a result of a shift in weight and the muscles that support your balance.  Your breasts will continue to grow and be tender.  Your gums may bleed and may be sensitive to brushing and flossing.  Dark spots or  blotches (chloasma, mask of pregnancy) may develop on your face. This will likely fade after the baby is born.  A dark line from your belly button to the pubic area (linea nigra) may appear. This will likely fade after the baby is born.  You may have changes in your hair. These can include thickening of your hair, rapid growth, and changes in texture. Some women also have hair loss during or after pregnancy, or hair that feels dry or thin. Your hair will most likely return to normal after your baby is born. WHAT TO EXPECT AT YOUR PRENATAL VISITS During a routine prenatal visit:  You will be weighed to make sure you and the fetus are growing normally.  Your blood pressure will be taken.  Your abdomen will be measured to track your baby's growth.  The fetal heartbeat will be listened to.  Any test results from the previous visit will be discussed. Your health care provider may ask you:  How you are feeling.  If you are feeling the baby move.  If you have had any abnormal symptoms, such as leaking fluid, bleeding, severe headaches, or abdominal cramping.  If you are using any tobacco products, including cigarettes, chewing tobacco, and electronic cigarettes.  If you have any questions. Other tests that may be performed during your second trimester include:  Blood tests that check for:  Low iron levels (anemia).  Gestational diabetes (between 24 and 28 weeks).  Rh antibodies.  Urine tests to  check for infections, diabetes, or protein in the urine.  An ultrasound to confirm the proper growth and development of the baby.  An amniocentesis to check for possible genetic problems.  Fetal screens for spina bifida and Down syndrome.  HIV (human immunodeficiency virus) testing. Routine prenatal testing includes screening for HIV, unless you choose not to have this test. HOME CARE INSTRUCTIONS   Avoid all smoking, herbs, alcohol, and unprescribed drugs. These chemicals affect the  formation and growth of the baby.  Do not use any tobacco products, including cigarettes, chewing tobacco, and electronic cigarettes. If you need help quitting, ask your health care provider. You may receive counseling support and other resources to help you quit.  Follow your health care provider's instructions regarding medicine use. There are medicines that are either safe or unsafe to take during pregnancy.  Exercise only as directed by your health care provider. Experiencing uterine cramps is a good sign to stop exercising.  Continue to eat regular, healthy meals.  Wear a good support bra for breast tenderness.  Do not use hot tubs, steam rooms, or saunas.  Wear your seat belt at all times when driving.  Avoid raw meat, uncooked cheese, cat litter boxes, and soil used by cats. These carry germs that can cause birth defects in the baby.  Take your prenatal vitamins.  Take 1500-2000 mg of calcium daily starting at the 20th week of pregnancy until you deliver your baby.  Try taking a stool softener (if your health care provider approves) if you develop constipation. Eat more high-fiber foods, such as fresh vegetables or fruit and whole grains. Drink plenty of fluids to keep your urine clear or pale yellow.  Take warm sitz baths to soothe any pain or discomfort caused by hemorrhoids. Use hemorrhoid cream if your health care provider approves.  If you develop varicose veins, wear support hose. Elevate your feet for 15 minutes, 3-4 times a day. Limit salt in your diet.  Avoid heavy lifting, wear low heel shoes, and practice good posture.  Rest with your legs elevated if you have leg cramps or low back pain.  Visit your dentist if you have not gone yet during your pregnancy. Use a soft toothbrush to brush your teeth and be gentle when you floss.  A sexual relationship may be continued unless your health care provider directs you otherwise.  Continue to go to all your prenatal visits  as directed by your health care provider. SEEK MEDICAL CARE IF:   You have dizziness.  You have mild pelvic cramps, pelvic pressure, or nagging pain in the abdominal area.  You have persistent nausea, vomiting, or diarrhea.  You have a bad smelling vaginal discharge.  You have pain with urination. SEEK IMMEDIATE MEDICAL CARE IF:   You have a fever.  You are leaking fluid from your vagina.  You have spotting or bleeding from your vagina.  You have severe abdominal cramping or pain.  You have rapid weight gain or loss.  You have shortness of breath with chest pain.  You notice sudden or extreme swelling of your face, hands, ankles, feet, or legs.  You have not felt your baby move in over an hour.  You have severe headaches that do not go away with medicine.  You have vision changes.   This information is not intended to replace advice given to you by your health care provider. Make sure you discuss any questions you have with your health care provider.   Document  Released: 11/27/2001 Document Revised: 12/24/2014 Document Reviewed: 02/03/2013 Elsevier Interactive Patient Education [employer]

## 2015-10-23 NOTE — Progress Notes (Signed)
Katherine Lindsey is a 29yo female presenting today for prenatal visit at 18 weeks and 4 weeks. Denies nausea, stating she is eating much better and gaining weight. UP to 163pounds from 160pounds at last visit. Notes fetal movement. Denies vaginal bleeding or discharge. Denies abdominal pain or contractions. Denies loss of fluid. Anatomy US obtained today with incomplete evaluation and echogenic focus in heart; recommend follow up in 3-4 weeks. Ultrasound ordered. Quad screen negative. Follow up in four weeks with ob clinic.

## 2015-10-26 NOTE — Telephone Encounter (Signed)
Called and discussed topics w/ patient

## 2015-11-18 ENCOUNTER — Inpatient Hospital Stay (HOSPITAL_COMMUNITY)
Admission: AD | Admit: 2015-11-18 | Discharge: 2015-11-18 | Disposition: A | Discharge status: Home / Self Care | Payer: Medicaid Other | Source: Ambulatory Visit | Attending: Obstetrics & Gynecology | Admitting: Obstetrics & Gynecology

## 2015-11-18 ENCOUNTER — Encounter (HOSPITAL_COMMUNITY): Payer: Self-pay | Admitting: *Deleted

## 2015-11-18 ENCOUNTER — Encounter: Payer: Medicaid Other | Admitting: Family Medicine

## 2015-11-18 ENCOUNTER — Other Ambulatory Visit: Payer: Self-pay | Admitting: Family Medicine

## 2015-11-18 ENCOUNTER — Ambulatory Visit (HOSPITAL_COMMUNITY)
Admission: RE | Admit: 2015-11-18 | Discharge: 2015-11-18 | Disposition: A | Discharge status: Home / Self Care | Payer: Medicaid Other | Source: Ambulatory Visit | Attending: Family Medicine | Admitting: Family Medicine

## 2015-11-18 DIAGNOSIS — O26899 Other specified pregnancy related conditions, unspecified trimester: Secondary | ICD-10-CM

## 2015-11-18 DIAGNOSIS — IMO0001 Reserved for inherently not codable concepts without codable children: Secondary | ICD-10-CM

## 2015-11-18 DIAGNOSIS — R109 Unspecified abdominal pain: Secondary | ICD-10-CM | POA: Diagnosis present

## 2015-11-18 DIAGNOSIS — O34219 Maternal care for unspecified type scar from previous cesarean delivery: Secondary | ICD-10-CM

## 2015-11-18 DIAGNOSIS — O23592 Infection of other part of genital tract in pregnancy, second trimester: Secondary | ICD-10-CM | POA: Diagnosis not present

## 2015-11-18 DIAGNOSIS — Z3A22 22 weeks gestation of pregnancy: Secondary | ICD-10-CM | POA: Diagnosis not present

## 2015-11-18 DIAGNOSIS — Z3402 Encounter for supervision of normal first pregnancy, second trimester: Secondary | ICD-10-CM

## 2015-11-18 DIAGNOSIS — B9689 Other specified bacterial agents as the cause of diseases classified elsewhere: Secondary | ICD-10-CM | POA: Insufficient documentation

## 2015-11-18 DIAGNOSIS — N760 Acute vaginitis: Secondary | ICD-10-CM | POA: Diagnosis not present

## 2015-11-18 DIAGNOSIS — O358XX0 Maternal care for other (suspected) fetal abnormality and damage, not applicable or unspecified: Secondary | ICD-10-CM | POA: Insufficient documentation

## 2015-11-18 DIAGNOSIS — Z36 Encounter for antenatal screening of mother: Secondary | ICD-10-CM | POA: Diagnosis not present

## 2015-11-18 DIAGNOSIS — Z0489 Encounter for examination and observation for other specified reasons: Secondary | ICD-10-CM

## 2015-11-18 DIAGNOSIS — IMO0002 Reserved for concepts with insufficient information to code with codable children: Secondary | ICD-10-CM

## 2015-11-18 DIAGNOSIS — O26872 Cervical shortening, second trimester: Secondary | ICD-10-CM | POA: Insufficient documentation

## 2015-11-18 DIAGNOSIS — O26879 Cervical shortening, unspecified trimester: Secondary | ICD-10-CM

## 2015-11-18 LAB — URINALYSIS, ROUTINE W REFLEX MICROSCOPIC
Bilirubin Urine: NEGATIVE
GLUCOSE, UA: NEGATIVE mg/dL
Hgb urine dipstick: NEGATIVE
Ketones, ur: >80 mg/dL — AB
LEUKOCYTES UA: NEGATIVE
Nitrite: NEGATIVE
PH: 6 (ref 5.0–8.0)
PROTEIN: NEGATIVE mg/dL
SPECIFIC GRAVITY, URINE: 1.025 (ref 1.005–1.030)

## 2015-11-18 LAB — WET PREP, GENITAL
SPERM: NONE SEEN
Trich, Wet Prep: NONE SEEN
Yeast Wet Prep HPF POC: NONE SEEN

## 2015-11-18 MED ORDER — PROGESTERONE MICRONIZED 200 MG PO CAPS: 200.0000 mg | ORAL_CAPSULE | Freq: Every day | ORAL | Status: DC

## 2015-11-18 MED ORDER — METRONIDAZOLE 500 MG PO TABS: 500.0000 mg | ORAL_TABLET | Freq: Two times a day (BID) | ORAL | Status: DC

## 2015-11-18 NOTE — Discharge Instructions (Signed)
Cervical Insufficiency °Cervical insufficiency is when the cervix is weak and starts to open (dilate) and thin (efface) before the pregnancy is at term and without labor starting. This is also called incompetent cervix. It can happen in the second or third trimester when the fetus starts putting pressure on the cervix. Cervical insufficiency can lead to a miscarriage, preterm premature rupture of the membranes (PPROM), or having the baby early (preterm birth).  °RISK FACTORS °You may be more likely to develop cervical insufficiency if: °· You have a shorter cervix than normal. °· Damage or injury occurred to your cervix from a past pregnancy or surgery. °· You were born with a cervical defect. °· You have had a procedure done on the cervix, such as cervical biopsy. °· You have a history of cervical insufficiency. °· You have a history of PPROM. °· You have ended several past pregnancies through abortion. °· You were exposed to the drug diethylstilbestrol (DES). °SYMPTOMS °Often times, women do not have any symptoms. Other times, women may only have mild symptoms that often start between week 14 through 20. The symptoms may last several days or weeks. These symptoms include: °· Light spotting or bleeding from the vagina. °· Pelvic pressure. °· A change in vaginal discharge, such as discharge that changes from clear, white, or light yellow to pink or tan. °· Back pain. °· Abdominal pain or cramping. °DIAGNOSIS °Cervical insufficiency cannot be diagnosed before you become pregnant. Once you are pregnant, your health care provider will ask about your medical history and if you have had any problems in past pregnancies. Tell your health care provider about any procedures performed on your cervix or if you have a history of miscarriages or cervical insufficiency. If your health care provider thinks you are at high risk for cervical insufficiency or show signs of cervical insufficiency, he or she may: °· Perform a pelvic  exam. This will check for: °¨ The presence of the membranes (amniotic sac) coming out of the cervix. °¨ Cervical abnormalities. °¨ Cervical injuries. °¨ The presence of contractions. °· Perform an ultrasonography (commonly called ultrasound) to measure the length and thickness of the cervix. °TREATMENT °If you have been diagnosed with cervical insufficiency, your health care provider may recommend: °· Limiting physical activity. °· Bed rest at home or in the hospital. °· Pelvic rest, which means no sexual intercourse or placing anything in the vagina. °· Cerclage to sew the cervix closed and prevent it from opening too early. The stitches (sutures) are removed between weeks 36 and 38 to avoid problems during labor. °Cerclage may be recommended during pregnancy if you have had a history of miscarriages or preterm births without a known cause. It may also be recommended if you have a short cervix that was identified by ultrasound or if your health care provider has found that your cervix has dilated before 24 weeks of pregnancy. Limiting physical activity and bed rest may or may not help prevent a preterm birth. °WHEN SHOULD YOU SEEK IMMEDIATE MEDICAL CARE?  °Seek immediate medical care if you show any symptoms of cervical insufficiency. You will need to go to the hospital to get checked immediately. °  °This information is not intended to replace advice given to you by your health care provider. Make sure you discuss any questions you have with your health care provider. °  °Document Released: 12/03/2005 Document Revised: 12/24/2014 Document Reviewed: 02/09/2013 °Elsevier Interactive Patient Education ©2016 Elsevier Inc. ° °

## 2015-11-18 NOTE — MAU Provider Note (Signed)
Chief Complaint:  Abdominal Pain   First Provider Initiated Contact with Patient 11/18/15 1858      HPI: Katherine Lindsey is a 29 y.o. [id number] at [redacted]w[redacted]d who presents to maternity admissions sent from [facility] for shortened cervix and possible contractions. She had f/u ultrasound today for EIF which remained isolated and unchanged on today's ultrasound.  But, today, she was found to have 1.2 cm cervical length with some funneling.  She reports that she is feeling like the baby is balling up 2-5 x /hour for several days but this is not painful.  She has not tried anything for this and reports it was not worrying her until today when she found out her cervix was shortening.   She reports good fetal movement, denies LOF, vaginal bleeding, vaginal itching/burning, urinary symptoms, h/a, dizziness, n/v, or fever/chills.    HPI  Past Medical History: Past Medical History  Diagnosis Date  . Gallstones   . Crohn's disease in remission [facility])     Hasn't had symptoms since 2004    Past obstetric history: OB History  Gravida Para Term Preterm AB SAB TAB Ectopic Multiple Living  0 1   2    # Outcome Date GA Lbr Len/2nd Weight Sex Delivery Anes PTL Lv  4 Current           3 Term 06/16/13 [redacted]w[redacted]d 10:40 / 05:46 6 lb 12.1 oz (3.065 kg) F CS-LVertical EPI  Y  2 Term 10/10/10 [redacted]w[redacted]d   F Vag-Spont   Y  1 TAB               Past Surgical History: Past Surgical History  Procedure Laterality Date  . Induced abortion  02/2012  . Cholecystectomy  08/28/2012    Procedure: LAPAROSCOPIC CHOLECYSTECTOMY WITH INTRAOPERATIVE CHOLANGIOGRAM;  Surgeon: [name], DO;  Location: [facility] OR;  Service: General;  Laterality: N/A;  . Cesarean section N/A 06/16/2013    Procedure: CESAREAN SECTION;  Surgeon: Reva Bores, MD;  Location: [facility] ORS;  Service: Obstetrics;  Laterality: N/A;    Family History: Family History  Problem Relation Age of Onset  . Other Neg Hx   . Diabetes Neg Hx   . Heart disease Neg Hx   .  Hyperlipidemia Neg Hx   . Hypertension Neg Hx   . Stroke Neg Hx     Social History: Social History  Substance Use Topics  . Smoking status: Never Smoker   . Smokeless tobacco: Never Used  . Alcohol Use: No    Allergies: No Known Allergies  Meds:  Prescriptions prior to admission  Medication Sig Dispense Refill Last Dose  . ondansetron (ZOFRAN) 4 MG tablet Take 1 tablet (4 mg total) by mouth every 8 (eight) hours as needed for nausea or vomiting. 20 tablet 0 Past Month at Unknown time  . Prenatal Vit-Fe Fumarate-FA (PRENATAL MULTIVITAMIN) TABS tablet Take 1 tablet by mouth daily at 12 noon. 90 tablet 3 Past Week at Unknown time  . vitamin B-6 (PYRIDOXINE) 25 MG tablet Take 1 tablet (25 mg total) by mouth daily. 90 tablet 2     ROS:  Review of Systems  Constitutional: Negative for fever, chills and fatigue.  HENT: Negative for sinus pressure.   Eyes: Negative for photophobia.  Respiratory: Negative for shortness of breath.   Cardiovascular: Negative for chest pain.  Gastrointestinal: Negative for nausea, vomiting, diarrhea and constipation.  Genitourinary: Negative for dysuria, frequency, flank pain, vaginal bleeding, vaginal discharge,  difficulty urinating, vaginal pain and pelvic pain.  Musculoskeletal: Negative for neck pain.  Neurological: Negative for dizziness, weakness and headaches.  Psychiatric/Behavioral: Negative.      I have reviewed patient's Past Medical Hx, Surgical Hx, Family Hx, Social Hx, medications and allergies.   Physical Exam   Patient Vitals for the past 24 hrs:  BP Temp Temp src Pulse Resp  11/18/15 1838 118/60 mmHg 98.5 F (36.9 C) - 73 18  11/18/15 1818 126/67 mmHg 98.1 F (36.7 C) Oral 76 18   Constitutional: Well-developed, well-nourished female in no acute distress.  Cardiovascular: normal rate Respiratory: normal effort GI: Abd soft, non-tender, gravid appropriate for gestational age.  MS: Extremities nontender, no edema, normal  ROM Neurologic: Alert and oriented x 4.  GU: Neg CVAT.     FHT:  149 by doppler Toco applied with some irritability noted, mild irregular cramping to palpation but pt reports tightening of her abdomen every 4-5 minutes while CNM in room  Physical Examination: General appearance - alert, well appearing, and in no distress, normal appearing weight and well hydrated Abdomen - soft, nontender, nondistended, no masses or organomegaly gravid nontender uterus Pelvic - VULVA: normal appearing vulva with no masses, tenderness or lesions, VAGINA: normal appearing vagina with normal color and discharge, no lesions, CERVIX: normal appearing cervix without discharge or lesions, multiparous, lite d/c c/w BV , UTERUS: enlarged to 24 week's size,  WET MOUNT done - results: clue cells, few wbc  Labs: No results found for this or any previous visit (from the past 24 hour(s)). A/POS/-- (08/30 1033)   Assessment: 1. Short cervix affecting pregnancy   2. BV  Plan: Consulted Dr Emelda Fear who will further evaluate pt in MAU    Medication List    ASK your doctor about these medications        ondansetron 4 MG tablet  Commonly known as:  ZOFRAN  Take 1 tablet (4 mg total) by mouth every 8 (eight) hours as needed for nausea or vomiting.     prenatal multivitamin Tabs tablet  Take 1 tablet by mouth daily at 12 noon.     vitamin B-6 25 MG tablet  Commonly known as:  pyridOXINE  Take 1 tablet (25 mg total) by mouth daily.        Sharen Counter Certified Nurse-Midwife 11/18/2015 7:27 PM   Exam by me and completion of plan:  Lengthy discussion of risks of short cervix; pt to begin pelvic rest. May continue current job for now, Rx metronidazole and Prometrium Repeat u/s 1 wk for cervical length. Refer to [facility]. Tilda Burrow, MD

## 2015-11-18 NOTE — MAU Note (Signed)
Still feeling "balling up", no bleeding or leaking

## 2015-11-18 NOTE — MAU Note (Addendum)
Had u/s in MFM and was told that her cervix is shortened and that she should be admitted; ? Preterm labor; denies any pain at present;goes to [facility] for her prenatal care; states that she has to stand a lot on her job and that she has "a lot of stress going on right now";

## 2015-11-22 ENCOUNTER — Ambulatory Visit (INDEPENDENT_AMBULATORY_CARE_PROVIDER_SITE_OTHER): Payer: Medicaid Other | Admitting: Family Medicine

## 2015-11-22 VITALS — BP 134/61 | HR 73 | Temp 98.1°F | Wt 166.2 lb

## 2015-11-22 DIAGNOSIS — O26879 Cervical shortening, unspecified trimester: Secondary | ICD-10-CM

## 2015-11-22 DIAGNOSIS — Z3482 Encounter for supervision of other normal pregnancy, second trimester: Secondary | ICD-10-CM

## 2015-11-22 LAB — GC/CHLAMYDIA PROBE AMP (~~LOC~~) NOT AT ARMC
Chlamydia: NEGATIVE
NEISSERIA GONORRHEA: NEGATIVE

## 2015-11-22 NOTE — Patient Instructions (Signed)
Cervical Insufficiency °Cervical insufficiency is when the cervix is weak and starts to open (dilate) and thin (efface) before the pregnancy is at term and without labor starting. This is also called incompetent cervix. It can happen in the second or third trimester when the fetus starts putting pressure on the cervix. Cervical insufficiency can lead to a miscarriage, preterm premature rupture of the membranes (PPROM), or having the baby early (preterm birth).  °RISK FACTORS °You may be more likely to develop cervical insufficiency if: °· You have a shorter cervix than normal. °· Damage or injury occurred to your cervix from a past pregnancy or surgery. °· You were born with a cervical defect. °· You have had a procedure done on the cervix, such as cervical biopsy. °· You have a history of cervical insufficiency. °· You have a history of PPROM. °· You have ended several past pregnancies through abortion. °· You were exposed to the drug diethylstilbestrol (DES). °SYMPTOMS °Often times, women do not have any symptoms. Other times, women may only have mild symptoms that often start between week 14 through 20. The symptoms may last several days or weeks. These symptoms include: °· Light spotting or bleeding from the vagina. °· Pelvic pressure. °· A change in vaginal discharge, such as discharge that changes from clear, white, or light yellow to pink or tan. °· Back pain. °· Abdominal pain or cramping. °DIAGNOSIS °Cervical insufficiency cannot be diagnosed before you become pregnant. Once you are pregnant, your health care provider will ask about your medical history and if you have had any problems in past pregnancies. Tell your health care provider about any procedures performed on your cervix or if you have a history of miscarriages or cervical insufficiency. If your health care provider thinks you are at high risk for cervical insufficiency or show signs of cervical insufficiency, he or she may: °· Perform a pelvic  exam. This will check for: °¨ The presence of the membranes (amniotic sac) coming out of the cervix. °¨ Cervical abnormalities. °¨ Cervical injuries. °¨ The presence of contractions. °· Perform an ultrasonography (commonly called ultrasound) to measure the length and thickness of the cervix. °TREATMENT °If you have been diagnosed with cervical insufficiency, your health care provider may recommend: °· Limiting physical activity. °· Bed rest at home or in the hospital. °· Pelvic rest, which means no sexual intercourse or placing anything in the vagina. °· Cerclage to sew the cervix closed and prevent it from opening too early. The stitches (sutures) are removed between weeks 36 and 38 to avoid problems during labor. °Cerclage may be recommended during pregnancy if you have had a history of miscarriages or preterm births without a known cause. It may also be recommended if you have a short cervix that was identified by ultrasound or if your health care provider has found that your cervix has dilated before 24 weeks of pregnancy. Limiting physical activity and bed rest may or may not help prevent a preterm birth. °WHEN SHOULD YOU SEEK IMMEDIATE MEDICAL CARE?  °Seek immediate medical care if you show any symptoms of cervical insufficiency. You will need to go to the hospital to get checked immediately. °  °This information is not intended to replace advice given to you by your health care provider. Make sure you discuss any questions you have with your health care provider. °  °Document Released: 12/03/2005 Document Revised: 12/24/2014 Document Reviewed: 02/09/2013 °Elsevier Interactive Patient Education ©2016 [employer] Inc. ° °

## 2015-11-25 NOTE — Progress Notes (Signed)
MELE OKLAND is a 29 y.o. [phone] at [redacted]w[redacted]d for routine follow up.  She reports pelvic pressure and occasional contractions that are rare since her recent MAU visit when she was found to have a cervical length of 1.2 cm.. See flow sheet for details.  A/P: Pregnancy at [redacted]w[redacted]d.  Doing well.   Pregnancy issues include cervical shortening Anatomy scan reviewed, problems are noted, see above. Preterm labor precautions reviewed. Pt has [facility] appt on Monday as she will need further management of her cervical shortening to hopefully prevent preterm birth.

## 2015-11-28 ENCOUNTER — Ambulatory Visit (INDEPENDENT_AMBULATORY_CARE_PROVIDER_SITE_OTHER): Payer: Medicaid Other | Admitting: Family Medicine

## 2015-11-28 VITALS — BP 131/62 | HR 73 | Temp 97.6°F | Wt 172.0 lb

## 2015-11-28 DIAGNOSIS — O34219 Maternal care for unspecified type scar from previous cesarean delivery: Secondary | ICD-10-CM

## 2015-11-28 DIAGNOSIS — Z98891 History of uterine scar from previous surgery: Secondary | ICD-10-CM

## 2015-11-28 DIAGNOSIS — Z3482 Encounter for supervision of other normal pregnancy, second trimester: Secondary | ICD-10-CM

## 2015-11-28 DIAGNOSIS — O26879 Cervical shortening, unspecified trimester: Secondary | ICD-10-CM

## 2015-11-28 DIAGNOSIS — O26872 Cervical shortening, second trimester: Secondary | ICD-10-CM

## 2015-11-28 HISTORY — DX: History of uterine scar from previous surgery: Z98.891

## 2015-11-28 LAB — POCT URINALYSIS DIP (DEVICE)
BILIRUBIN URINE: NEGATIVE
Glucose, UA: NEGATIVE mg/dL
Hgb urine dipstick: NEGATIVE
Ketones, ur: NEGATIVE mg/dL
LEUKOCYTES UA: NEGATIVE
NITRITE: NEGATIVE
Protein, ur: 30 mg/dL — AB
Specific Gravity, Urine: >=1.03 (ref 1.005–1.030)
Urobilinogen, UA: 1 mg/dL (ref 0.0–1.0)
pH: 6.5 (ref 5.0–8.0)

## 2015-11-28 NOTE — Patient Instructions (Signed)
Breastfeeding Deciding to breastfeed is one of the best choices you can make for you and your baby. A change in hormones during pregnancy causes your breast tissue to grow and increases the number and size of your milk ducts. These hormones also allow proteins, sugars, and fats from your blood supply to make breast milk in your milk-producing glands. Hormones prevent breast milk from being released before your baby is born as well as prompt milk flow after birth. Once breastfeeding has begun, thoughts of your baby, as well as his or her sucking or crying, can stimulate the release of milk from your milk-producing glands.  BENEFITS OF BREASTFEEDING For Your Baby  Your first milk (colostrum) helps your baby's digestive system function better.  There are antibodies in your milk that help your baby fight off infections.  Your baby has a lower incidence of asthma, allergies, and sudden infant death syndrome.  The nutrients in breast milk are better for your baby than infant formulas and are designed uniquely for your baby's needs.  Breast milk improves your baby's brain development.  Your baby is less likely to develop other conditions, such as childhood obesity, asthma, or type 2 diabetes mellitus. For You  Breastfeeding helps to create a very special bond between you and your baby.  Breastfeeding is convenient. Breast milk is always available at the correct temperature and costs nothing.  Breastfeeding helps to burn calories and helps you lose the weight gained during pregnancy.  Breastfeeding makes your uterus contract to its prepregnancy size faster and slows bleeding (lochia) after you give birth.   Breastfeeding helps to lower your risk of developing type 2 diabetes mellitus, osteoporosis, and breast or ovarian cancer later in life. SIGNS THAT YOUR BABY IS HUNGRY Early Signs of Hunger  Increased alertness or activity.  Stretching.  Movement of the head from side to  side.  Movement of the head and opening of the mouth when the corner of the mouth or cheek is stroked (rooting).  Increased sucking sounds, smacking lips, cooing, sighing, or squeaking.  Hand-to-mouth movements.  Increased sucking of fingers or hands. Late Signs of Hunger  Fussing.  Intermittent crying. Extreme Signs of Hunger Signs of extreme hunger will require calming and consoling before your baby will be able to breastfeed successfully. Do not wait for the following signs of extreme hunger to occur before you initiate breastfeeding:  Restlessness.  A loud, strong cry.  Screaming. BREASTFEEDING BASICS Breastfeeding Initiation  Find a comfortable place to sit or lie down, with your neck and back well supported.  Place a pillow or rolled up blanket under your baby to bring him or her to the level of your breast (if you are seated). Nursing pillows are specially designed to help support your arms and your baby while you breastfeed.  Make sure that your baby's abdomen is facing your abdomen.  Gently massage your breast. With your fingertips, massage from your chest wall toward your nipple in a circular motion. This encourages milk flow. You may need to continue this action during the feeding if your milk flows slowly.  Support your breast with 4 fingers underneath and your thumb above your nipple. Make sure your fingers are well away from your nipple and your baby's mouth.  Stroke your baby's lips gently with your finger or nipple.  When your baby's mouth is open wide enough, quickly bring your baby to your breast, placing your entire nipple and as much of the colored area around your nipple (  areola) as possible into your baby's mouth.  More areola should be visible above your baby's upper lip than below the lower lip.  Your baby's tongue should be between his or her lower gum and your breast.  Ensure that your baby's mouth is correctly positioned around your nipple  (latched). Your baby's lips should create a seal on your breast and be turned out (everted).  It is common for your baby to suck about 2-3 minutes in order to start the flow of breast milk. Latching Teaching your baby how to latch on to your breast properly is very important. An improper latch can cause nipple pain and decreased milk supply for you and poor weight gain in your baby. Also, if your baby is not latched onto your nipple properly, he or she may swallow some air during feeding. This can make your baby fussy. Burping your baby when you switch breasts during the feeding can help to get rid of the air. However, teaching your baby to latch on properly is still the best way to prevent fussiness from swallowing air while breastfeeding. Signs that your baby has successfully latched on to your nipple:  Silent tugging or silent sucking, without causing you pain.  Swallowing heard between every 3-4 sucks.  Muscle movement above and in front of his or her ears while sucking. Signs that your baby has not successfully latched on to nipple:  Sucking sounds or smacking sounds from your baby while breastfeeding.  Nipple pain. If you think your baby has not latched on correctly, slip your finger into the corner of your baby's mouth to break the suction and place it between your baby's gums. Attempt breastfeeding initiation again. Signs of Successful Breastfeeding Signs from your baby:  A gradual decrease in the number of sucks or complete cessation of sucking.  Falling asleep.  Relaxation of his or her body.  Retention of a small amount of milk in his or her mouth.  Letting go of your breast by himself or herself. Signs from you:  Breasts that have increased in firmness, weight, and size 1-3 hours after feeding.  Breasts that are softer immediately after breastfeeding.  Increased milk volume, as well as a change in milk consistency and color by the fifth day of breastfeeding.  Nipples  that are not sore, cracked, or bleeding. Signs That Your Baby is Getting Enough Milk  Wetting at least 3 diapers in a 24-hour period. The urine should be clear and pale yellow by age 5 days.  At least 3 stools in a 24-hour period by age 5 days. The stool should be soft and yellow.  At least 3 stools in a 24-hour period by age 7 days. The stool should be seedy and yellow.  No loss of weight greater than 10% of birth weight during the first 3 days of age.  Average weight gain of 4-7 ounces (113-198 g) per week after age 4 days.  Consistent daily weight gain by age 5 days, without weight loss after the age of 2 weeks. After a feeding, your baby may spit up a small amount. This is common. BREASTFEEDING FREQUENCY AND DURATION Frequent feeding will help you make more milk and can prevent sore nipples and breast engorgement. Breastfeed when you feel the need to reduce the fullness of your breasts or when your baby shows signs of hunger. This is called "breastfeeding on demand." Avoid introducing a pacifier to your baby while you are working to establish breastfeeding (the first 4-6 weeks   after your baby is born). After this time you may choose to use a pacifier. Research has shown that pacifier use during the first year of a baby's life decreases the risk of sudden infant death syndrome (SIDS). Allow your baby to feed on each breast as long as he or she wants. Breastfeed until your baby is finished feeding. When your baby unlatches or falls asleep while feeding from the first breast, offer the second breast. Because newborns are often sleepy in the first few weeks of life, you may need to awaken your baby to get him or her to feed. Breastfeeding times will vary from baby to baby. However, the following rules can serve as a guide to help you ensure that your baby is properly fed:  Newborns (babies 4 weeks of age or younger) may breastfeed every 1-3 hours.  Newborns should not go longer than 3 hours  during the day or 5 hours during the night without breastfeeding.  You should breastfeed your baby a minimum of 8 times in a 24-hour period until you begin to introduce solid foods to your baby at around 6 months of age. BREAST MILK PUMPING Pumping and storing breast milk allows you to ensure that your baby is exclusively fed your breast milk, even at times when you are unable to breastfeed. This is especially important if you are going back to work while you are still breastfeeding or when you are not able to be present during feedings. Your lactation consultant can give you guidelines on how long it is safe to store breast milk. A breast pump is a machine that allows you to pump milk from your breast into a sterile bottle. The pumped breast milk can then be stored in a refrigerator or freezer. Some breast pumps are operated by hand, while others use electricity. Ask your lactation consultant which type will work best for you. Breast pumps can be purchased, but some hospitals and breastfeeding support groups lease breast pumps on a monthly basis. A lactation consultant can teach you how to hand express breast milk, if you prefer not to use a pump. CARING FOR YOUR BREASTS WHILE YOU BREASTFEED Nipples can become dry, cracked, and sore while breastfeeding. The following recommendations can help keep your breasts moisturized and healthy:  Avoid using soap on your nipples.  Wear a supportive bra. Although not required, special nursing bras and tank tops are designed to allow access to your breasts for breastfeeding without taking off your entire bra or top. Avoid wearing underwire-style bras or extremely tight bras.  Air dry your nipples for 3-4minutes after each feeding.  Use only cotton bra pads to absorb leaked breast milk. Leaking of breast milk between feedings is normal.  Use lanolin on your nipples after breastfeeding. Lanolin helps to maintain your skin's normal moisture barrier. If you use  pure lanolin, you do not need to wash it off before feeding your baby again. Pure lanolin is not toxic to your baby. You may also hand express a few drops of breast milk and gently massage that milk into your nipples and allow the milk to air dry. In the first few weeks after giving birth, some women experience extremely full breasts (engorgement). Engorgement can make your breasts feel heavy, warm, and tender to the touch. Engorgement peaks within 3-5 days after you give birth. The following recommendations can help ease engorgement:  Completely empty your breasts while breastfeeding or pumping. You may want to start by applying warm, moist heat (in   the shower or with warm water-soaked hand towels) just before feeding or pumping. This increases circulation and helps the milk flow. If your baby does not completely empty your breasts while breastfeeding, pump any extra milk after he or she is finished.  Wear a snug bra (nursing or regular) or tank top for 1-2 days to signal your body to slightly decrease milk production.  Apply ice packs to your breasts, unless this is too uncomfortable for you.  Make sure that your baby is latched on and positioned properly while breastfeeding. If engorgement persists after 48 hours of following these recommendations, contact your health care provider or a Science writer. OVERALL HEALTH CARE RECOMMENDATIONS WHILE BREASTFEEDING  Eat healthy foods. Alternate between meals and snacks, eating 3 of each per day. Because what you eat affects your breast milk, some of the foods may make your baby more irritable than usual. Avoid eating these foods if you are sure that they are negatively affecting your baby.  Drink milk, fruit juice, and water to satisfy your thirst (about 10 glasses a day).  Rest often, relax, and continue to take your prenatal vitamins to prevent fatigue, stress, and anemia.  Continue breast self-awareness checks.  Avoid chewing and smoking  tobacco. Chemicals from cigarettes that pass into breast milk and exposure to secondhand smoke may harm your baby.  Avoid alcohol and drug use, including marijuana. Some medicines that may be harmful to your baby can pass through breast milk. It is important to ask your health care provider before taking any medicine, including all over-the-counter and prescription medicine as well as vitamin and herbal supplements. It is possible to become pregnant while breastfeeding. If birth control is desired, ask your health care provider about options that will be safe for your baby. SEEK MEDICAL CARE IF:  You feel like you want to stop breastfeeding or have become frustrated with breastfeeding.  You have painful breasts or nipples.  Your nipples are cracked or bleeding.  Your breasts are red, tender, or warm.  You have a swollen area on either breast.  You have a fever or chills.  You have nausea or vomiting.  You have drainage other than breast milk from your nipples.  Your breasts do not become full before feedings by the fifth day after you give birth.  You feel sad and depressed.  Your baby is too sleepy to eat well.  Your baby is having trouble sleeping.   Your baby is wetting less than 3 diapers in a 24-hour period.  Your baby has less than 3 stools in a 24-hour period.  Your baby's skin or the white part of his or her eyes becomes yellow.   Your baby is not gaining weight by 77 days of age. SEEK IMMEDIATE MEDICAL CARE IF:  Your baby is overly tired (lethargic) and does not want to wake up and feed.  Your baby develops an unexplained fever.   This information is not intended to replace advice given to you by your health care provider. Make sure you discuss any questions you have with your health care provider.   Document Released: 12/03/2005 Document Revised: 08/24/2015 Document Reviewed: 05/27/2013 Elsevier Interactive Patient Education 2016 [place] After Cesarean Delivery Information A trial of labor after cesarean delivery (TOLAC) is when a woman tries to give birth vaginally after a previous cesarean delivery. TOLAC may be a safe and appropriate option for you depending on your medical history and other risk factors. When  TOLAC is successful and you are able to have a vaginal delivery, this is called a vaginal birth after cesarean delivery (VBAC).  CANDIDATES FOR TOLAC TOLAC is possible for some women who:  Have undergone one or two prior cesarean deliveries in which the incision of the uterus was horizontal (low transverse).  Are carrying twins and have had one prior low transverse incision during a cesarean delivery.  Do not have a vertical (classical) uterine scar.  Have not had a tear in the wall of their uterus (uterine rupture). TOLAC is also supported for women who meet appropriate criteria and:  Are under the age of 75 years.  Are tall and have a body mass index (BMI) of less than 30.  Have an unknown uterine scar.  Give birth in a facility equipped to handle an emergency cesarean delivery. This team should be able to handle possible complications such as a uterine rupture.  Have thorough counseling about the benefits and risks of TOLAC.  Have discussed future pregnancy plans with their health care provider.  Plan to have several more pregnancies. MOST SUCCESSFUL CANDIDATES FOR TOLAC:  Have had a successful vaginal delivery before or after their cesarean delivery.  Experience labor that begins naturally on or before the due date (40 weeks of gestation).  Do not have a very large (macrosomic) baby.   Had a prior cesarean delivery but are not currently experiencing factors that would prompt a cesarean delivery (such as a breech position).  Had only one prior cesarean delivery.  Had a prior cesarean delivery that was performed early in labor and not after full cervical dilation. TOLAC may be most  appropriate for women who meet the above guidelines and who plan to have more pregnancies. TOLAC is not recommended for home births. LEAST SUCCESSFUL CANDIDATES FOR TOLAC:  Have an induced labor with an unfavorable cervix. An unfavorable cervix is when the cervix is not dilating enough (among other factors).  Have never had a vaginal delivery.  Have had more than two cesarean deliveries.  Have a pregnancy at more than 40 weeks of gestation.  Are pregnant with a baby with a suspected weight greater than 4,000 grams (8 pounds) and who have no prior history of a vaginal delivery.  Have closely spaced pregnancies. SUGGESTED BENEFITS OF TOLAC  You may have a faster recovery time.  You may have a shorter stay in the hospital.  You may have less pain and fewer problems than with a cesarean delivery. Women who have a cesarean delivery have a higher chance of needing blood or getting a fever, an infection, or a blood clot in the legs. SUGGESTED RISKS OF TOLAC The highest risk of complications happens to women who attempt a TOLAC and fail. A failed TOLAC results in an unplanned cesarean delivery. Risks related to [facility] or repeat cesarean deliveries include:   Blood loss.  Infection.  Blood clot.  Injury to surrounding tissues or organs.  Having to remove the uterus (hysterectomy).  Potential problems with the placenta (such as placenta previa or placenta accreta) in future pregnancies. Although very rare, the main concerns with TOLAC are:  Rupture of the uterine scar from a past cesarean delivery.  Needing an emergency cesarean delivery.  Having a bad outcome for the baby (perinatal morbidity). FOR MORE INFORMATION American Congress of Obstetricians and Gynecologists: www.acog.[place]: www.midwife.org   This information is not intended to replace advice given to you by your health care provider. Make sure you  discuss any questions you have with  your health care provider.   Document Released: 08/21/2011 Document Revised: 09/23/2013 Document Reviewed: 05/25/2013 Elsevier Interactive Patient Education 2016 [employer].  Preterm Labor Information Preterm labor is when labor starts at less than 37 weeks of pregnancy. The normal length of a pregnancy is 39 to 41 weeks. CAUSES Often, there is no identifiable underlying cause as to why a woman goes into preterm labor. One of the most common known causes of preterm labor is infection. Infections of the uterus, cervix, vagina, amniotic sac, bladder, kidney, or even the lungs (pneumonia) can cause labor to start. Other suspected causes of preterm labor include:   Urogenital infections, such as yeast infections and bacterial vaginosis.   Uterine abnormalities (uterine shape, uterine septum, fibroids, or bleeding from the placenta).   A cervix that has been operated on (it may fail to stay closed).   Malformations in the fetus.   Multiple gestations (twins, triplets, and so on).   Breakage of the amniotic sac.  RISK FACTORS  Having a previous history of preterm labor.   Having premature rupture of membranes (PROM).   Having a placenta that covers the opening of the cervix (placenta previa).   Having a placenta that separates from the uterus (placental abruption).   Having a cervix that is too weak to hold the fetus in the uterus (incompetent cervix).   Having too much fluid in the amniotic sac (polyhydramnios).   Taking illegal drugs or smoking while pregnant.   Not gaining enough weight while pregnant.   Being younger than 6 and older than 28 years old.   Having a low socioeconomic status.   Being African American. SYMPTOMS Signs and symptoms of preterm labor include:   Menstrual-like cramps, abdominal pain, or back pain.  Uterine contractions that are regular, as frequent as six in an hour, regardless of their intensity (may be mild or  painful).  Contractions that start on the top of the uterus and spread down to the lower abdomen and back.   A sense of increased pelvic pressure.   A watery or bloody mucus discharge that comes from the vagina.  TREATMENT Depending on the length of the pregnancy and other circumstances, your health care provider may suggest bed rest. If necessary, there are medicines that can be given to stop contractions and to mature the fetal lungs. If labor happens before 34 weeks of pregnancy, a prolonged hospital stay may be recommended. Treatment depends on the condition of both you and the fetus.  WHAT SHOULD YOU DO IF YOU THINK YOU ARE IN PRETERM LABOR? Call your health care provider right away. You will need to go to the hospital to get checked immediately. HOW CAN YOU PREVENT PRETERM LABOR IN FUTURE PREGNANCIES? You should:   Stop smoking if you smoke.  Maintain healthy weight gain and avoid chemicals and drugs that are not necessary.  Be watchful for any type of infection.  Inform your health care provider if you have a known history of preterm labor.   This information is not intended to replace advice given to you by your health care provider. Make sure you discuss any questions you have with your health care provider.   Document Released: 02/23/2004 Document Revised: 08/05/2013 Document Reviewed: 01/05/2013 Elsevier Interactive Patient Education [employer]

## 2015-11-28 NOTE — Progress Notes (Signed)
U/S 11/30/15 @ 345p.

## 2015-11-28 NOTE — Progress Notes (Signed)
Subjective:  Katherine Lindsey is a 29 y.o. [phone] at [redacted]w[redacted]d being seen today for transfer of prenatal care from [facility] due to short cervix.  She is currently monitored for the following issues for this high-risk pregnancy and has [place]; Encounter for supervision of other normal pregnancy; Short cervix affecting pregnancy; and Previous cesarean delivery, antepartum on her problem list. She is having similar amount of contractions but she is on her prometrium.  Patient reports no bleeding and no leaking.  Contractions: Irregular. Vag. Bleeding: None.  Movement: Present. Denies leaking of fluid.   The following portions of the patient's history were reviewed and updated as appropriate: allergies, current medications, past family history, past medical history, past social history, past surgical history and problem list. Problem list updated.  Objective:   Filed Vitals:   11/28/15 0855  BP: 131/62  Pulse: 73  Temp: 97.6 F (36.4 C)  Weight: 172 lb (78.019 kg)    Fetal Status: Fetal Heart Rate (bpm): 151 Fundal Height: 24 cm Movement: Present     General:  Alert, oriented and cooperative. Patient is in no acute distress.  Skin: Skin is warm and dry. No rash noted.   Cardiovascular: Normal heart rate noted  Respiratory: Normal respiratory effort, no problems with respiration noted  Abdomen: Soft, gravid, appropriate for gestational age. Pain/Pressure: Present     Pelvic: Vag. Bleeding: None     Cervical exam deferred        Extremities: Normal range of motion.  Edema: None  Mental Status: Normal mood and affect. Normal behavior. Normal judgment and thought content.   Urinalysis: Urine Protein: 1+ Urine Glucose: Negative  Assessment and Plan:  Pregnancy: [id number] at [redacted]w[redacted]d  1. Previous cesarean delivery, antepartum Given info on TOLAC--advised to do this.--ask again next visit and have her sign TOLAC form  2. Encounter for supervision of other normal pregnancy in second  trimester Continue routine prenatal care. Will need 28 wk labs at next visit.  3. Short cervix affecting pregnancy Continue Prometrium - [place] [facility] OB Transvaginal; Future  Preterm labor symptoms and general obstetric precautions including but not limited to vaginal bleeding, contractions, leaking of fluid and fetal movement were reviewed in detail with the patient. Please refer to After Visit Summary for other counseling recommendations.  Return in 3 weeks (on 12/19/2015).   Reva Bores, MD

## 2015-11-30 ENCOUNTER — Ambulatory Visit (HOSPITAL_COMMUNITY)
Admission: RE | Admit: 2015-11-30 | Discharge: 2015-11-30 | Disposition: A | Discharge status: Home / Self Care | Payer: Medicaid Other | Source: Ambulatory Visit | Attending: Family Medicine | Admitting: Family Medicine

## 2015-11-30 ENCOUNTER — Other Ambulatory Visit: Payer: Self-pay | Admitting: Family Medicine

## 2015-11-30 DIAGNOSIS — O34219 Maternal care for unspecified type scar from previous cesarean delivery: Secondary | ICD-10-CM | POA: Diagnosis not present

## 2015-11-30 DIAGNOSIS — O283 Abnormal ultrasonic finding on antenatal screening of mother: Secondary | ICD-10-CM | POA: Insufficient documentation

## 2015-11-30 DIAGNOSIS — O26872 Cervical shortening, second trimester: Secondary | ICD-10-CM | POA: Diagnosis not present

## 2015-11-30 DIAGNOSIS — O26879 Cervical shortening, unspecified trimester: Secondary | ICD-10-CM

## 2015-11-30 DIAGNOSIS — Z3A24 24 weeks gestation of pregnancy: Secondary | ICD-10-CM | POA: Diagnosis not present

## 2015-11-30 MED ORDER — BETAMETHASONE SOD PHOS & ACET 6 (3-3) MG/ML IJ SUSP: 12.0000 mg | Freq: Once | INTRAMUSCULAR | Status: DC

## 2015-11-30 MED ORDER — BETAMETHASONE SOD PHOS & ACET 6 (3-3) MG/ML IJ SUSP
12.0000 mg | Freq: Once | INTRAMUSCULAR | Status: AC
  Administered 2015-11-30: 12 mg via INTRAMUSCULAR
  Filled 2015-11-30: qty 2

## 2015-12-01 ENCOUNTER — Other Ambulatory Visit (HOSPITAL_COMMUNITY): Payer: Self-pay | Admitting: *Deleted

## 2015-12-01 ENCOUNTER — Ambulatory Visit (HOSPITAL_COMMUNITY)
Admission: RE | Admit: 2015-12-01 | Discharge: 2015-12-01 | Disposition: A | Discharge status: Home / Self Care | Payer: Medicaid Other | Source: Ambulatory Visit | Attending: Family Medicine | Admitting: Family Medicine

## 2015-12-01 DIAGNOSIS — O26872 Cervical shortening, second trimester: Secondary | ICD-10-CM | POA: Insufficient documentation

## 2015-12-01 DIAGNOSIS — O26879 Cervical shortening, unspecified trimester: Secondary | ICD-10-CM

## 2015-12-01 DIAGNOSIS — Z3A24 24 weeks gestation of pregnancy: Secondary | ICD-10-CM | POA: Insufficient documentation

## 2015-12-01 MED ORDER — BETAMETHASONE SOD PHOS & ACET 6 (3-3) MG/ML IJ SUSP
12.0000 mg | Freq: Once | INTRAMUSCULAR | Status: AC
  Administered 2015-12-01: 12 mg via INTRAMUSCULAR
  Filled 2015-12-01: qty 2

## 2015-12-07 ENCOUNTER — Other Ambulatory Visit (HOSPITAL_COMMUNITY): Payer: Self-pay | Admitting: Maternal and Fetal Medicine

## 2015-12-07 ENCOUNTER — Ambulatory Visit (HOSPITAL_COMMUNITY): Payer: Medicaid Other

## 2015-12-07 ENCOUNTER — Encounter (HOSPITAL_COMMUNITY): Payer: Self-pay

## 2015-12-07 ENCOUNTER — Ambulatory Visit (HOSPITAL_COMMUNITY)
Admission: RE | Admit: 2015-12-07 | Discharge: 2015-12-07 | Disposition: A | Discharge status: Home / Self Care | Payer: Medicaid Other | Source: Ambulatory Visit | Attending: Maternal and Fetal Medicine | Admitting: Maternal and Fetal Medicine

## 2015-12-07 VITALS — BP 126/65 | HR 70 | Wt 171.6 lb

## 2015-12-07 DIAGNOSIS — O26879 Cervical shortening, unspecified trimester: Secondary | ICD-10-CM

## 2015-12-07 DIAGNOSIS — O34219 Maternal care for unspecified type scar from previous cesarean delivery: Secondary | ICD-10-CM | POA: Diagnosis not present

## 2015-12-07 DIAGNOSIS — O358XX0 Maternal care for other (suspected) fetal abnormality and damage, not applicable or unspecified: Secondary | ICD-10-CM

## 2015-12-07 DIAGNOSIS — O26872 Cervical shortening, second trimester: Secondary | ICD-10-CM | POA: Diagnosis not present

## 2015-12-07 DIAGNOSIS — O35BXX0 Maternal care for other (suspected) fetal abnormality and damage, fetal cardiac anomalies, not applicable or unspecified: Secondary | ICD-10-CM

## 2015-12-07 DIAGNOSIS — Z3A25 25 weeks gestation of pregnancy: Secondary | ICD-10-CM

## 2015-12-18 HISTORY — DX: Maternal care for unspecified type scar from previous cesarean delivery: O34.219

## 2015-12-18 NOTE — L&D Delivery Note (Signed)
Delivery Note Pt pushed with approx 5 ctx and at 2:53 AM a viable female was delivered via VBAC, Spontaneous (Presentation: Left Occiput Anterior).  APGAR: 9, 9; weight: pending .  Cord clamped and cut by FOB; hospital cord blood sample collected. Placenta status: Intact, Spontaneous.  Cord: 3 vessel  Anesthesia: Epidural  Episiotomy: None Lacerations: None Est. Blood Loss (mL): 100  Mom to postpartum.  Baby to Couplet care / Skin to Skin.  Cam Hai CNM 03/17/2016, 3:27 AM

## 2015-12-21 ENCOUNTER — Encounter: Payer: Medicaid Other | Admitting: Certified Nurse Midwife

## 2015-12-21 ENCOUNTER — Encounter: Payer: Self-pay | Admitting: Family Medicine

## 2015-12-22 ENCOUNTER — Ambulatory Visit (HOSPITAL_COMMUNITY): Payer: Medicaid Other

## 2015-12-27 ENCOUNTER — Encounter (HOSPITAL_COMMUNITY): Payer: Self-pay | Admitting: *Deleted

## 2015-12-27 ENCOUNTER — Inpatient Hospital Stay (HOSPITAL_COMMUNITY): Payer: Medicaid Other

## 2015-12-27 ENCOUNTER — Inpatient Hospital Stay (HOSPITAL_COMMUNITY)
Admission: AD | Admit: 2015-12-27 | Discharge: 2015-12-27 | Disposition: A | Discharge status: Home / Self Care | Payer: Medicaid Other | Source: Ambulatory Visit | Attending: Obstetrics & Gynecology | Admitting: Obstetrics & Gynecology

## 2015-12-27 DIAGNOSIS — O26873 Cervical shortening, third trimester: Secondary | ICD-10-CM | POA: Diagnosis not present

## 2015-12-27 DIAGNOSIS — O4703 False labor before 37 completed weeks of gestation, third trimester: Secondary | ICD-10-CM | POA: Diagnosis not present

## 2015-12-27 DIAGNOSIS — Z3A28 28 weeks gestation of pregnancy: Secondary | ICD-10-CM | POA: Insufficient documentation

## 2015-12-27 DIAGNOSIS — O26879 Cervical shortening, unspecified trimester: Secondary | ICD-10-CM

## 2015-12-27 DIAGNOSIS — R1084 Generalized abdominal pain: Secondary | ICD-10-CM | POA: Insufficient documentation

## 2015-12-27 DIAGNOSIS — O34219 Maternal care for unspecified type scar from previous cesarean delivery: Secondary | ICD-10-CM

## 2015-12-27 DIAGNOSIS — K5090 Crohn's disease, unspecified, without complications: Secondary | ICD-10-CM | POA: Insufficient documentation

## 2015-12-27 LAB — URINE MICROSCOPIC-ADD ON

## 2015-12-27 LAB — URINALYSIS, ROUTINE W REFLEX MICROSCOPIC
BILIRUBIN URINE: NEGATIVE
Glucose, UA: NEGATIVE mg/dL
HGB URINE DIPSTICK: NEGATIVE
KETONES UR: NEGATIVE mg/dL
NITRITE: NEGATIVE
PROTEIN: NEGATIVE mg/dL
SPECIFIC GRAVITY, URINE: 1.02 (ref 1.005–1.030)
pH: 6.5 (ref 5.0–8.0)

## 2015-12-27 NOTE — MAU Note (Signed)
PT  SAYS  LAST NIGHT  AFTER  SHE GOT OFF WORK AT 730PM-  FELT  UC.  THEN DURING  THE  NIGHT  LESS.         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    PNC-  [facility]-        WAS IN OFFICE  2 WEEKS   AGO-    CLOSED.    GETS U/S AT   [facility].         LAST SEX-    BEGINNING  OF  DEC.

## 2015-12-27 NOTE — Discharge Instructions (Signed)
Braxton Hicks Contractions °Contractions of the uterus can occur throughout pregnancy. Contractions are not always a sign that you are in labor.  °WHAT ARE BRAXTON HICKS CONTRACTIONS?  °Contractions that occur before labor are called Braxton Hicks contractions, or false labor. Toward the end of pregnancy (32-34 weeks), these contractions can develop more often and may become more forceful. This is not true labor because these contractions do not result in opening (dilatation) and thinning of the cervix. They are sometimes difficult to tell apart from true labor because these contractions can be forceful and people have different pain tolerances. You should not feel embarrassed if you go to the hospital with false labor. Sometimes, the only way to tell if you are in true labor is for your health care provider to look for changes in the cervix. °If there are no prenatal problems or other health problems associated with the pregnancy, it is completely safe to be sent home with false labor and await the onset of true labor. °HOW CAN YOU TELL THE DIFFERENCE BETWEEN TRUE AND FALSE LABOR? °False Labor °· The contractions of false labor are usually shorter and not as hard as those of true labor.   °· The contractions are usually irregular.   °· The contractions are often felt in the front of the lower abdomen and in the groin.   °· The contractions may go away when you walk around or change positions while lying down.   °· The contractions get weaker and are shorter lasting as time goes on.   °· The contractions do not usually become progressively stronger, regular, and closer together as with true labor.   °True Labor °· Contractions in true labor last 30-70 seconds, become very regular, usually become more intense, and increase in frequency.   °· The contractions do not go away with walking.   °· The discomfort is usually felt in the top of the uterus and spreads to the lower abdomen and low back.   °· True labor can be  determined by your health care provider with an exam. This will show that the cervix is dilating and getting thinner.   °WHAT TO REMEMBER °· Keep up with your usual exercises and follow other instructions given by your health care provider.   °· Take medicines as directed by your health care provider.   °· Keep your regular prenatal appointments.   °· Eat and drink lightly if you think you are going into labor.   °· If Braxton Hicks contractions are making you uncomfortable:   °¨ Change your position from lying down or resting to walking, or from walking to resting.   °¨ Sit and rest in a tub of warm water.   °¨ Drink 2-3 glasses of water. Dehydration may cause these contractions.   °¨ Do slow and deep breathing several times an hour.   °WHEN SHOULD I SEEK IMMEDIATE MEDICAL CARE? °Seek immediate medical care if: °· Your contractions become stronger, more regular, and closer together.   °· You have fluid leaking or gushing from your vagina.   °· You have a fever.   °· You pass blood-tinged mucus.   °· You have vaginal bleeding.   °· You have continuous abdominal pain.   °· You have low back pain that you never had before.   °· You feel your baby's head pushing down and causing pelvic pressure.   °· Your baby is not moving as much as it used to.   °  °This information is not intended to replace advice given to you by your health care provider. Make sure you discuss any questions you have with your health care   provider. °  °Document Released: 12/03/2005 Document Revised: 12/08/2013 Document Reviewed: 09/14/2013 °Elsevier Interactive Patient Education ©2016 Elsevier Inc. ° °

## 2015-12-27 NOTE — MAU Provider Note (Signed)
Chief Complaint:  Abdominal Pain and Abdominal Cramping   First Provider Initiated Contact with Patient 12/27/15 2128      HPI: Katherine Lindsey is a 30 y.o. [id number] at 65w1dwho presents to maternity admissions reporting cramping with onset yesterday, resolving last night, then returning today while at work. She describes the cramping as intermittent irregular abdominal pain less than 4-5x/hour that is moderate in intensity. She has not taken anything for pain, and nothing seems to make it better or worse.  She reports she may not be drinking enough fluids. She is a recent transfer to [facility] from [facility] for shortening cervix measured at 1.2 cm on 12/14 but then measured at 2.3 cm on follow up US on 12/21.  She reports good fetal movement, denies LOF, vaginal bleeding, vaginal itching/burning, urinary symptoms, h/a, dizziness, n/v, or fever/chills.    HPI  Past Medical History: Past Medical History  Diagnosis Date  . Gallstones   . Crohn's disease in remission [facility])     Hasn't had symptoms since 2004    Past obstetric history: OB History  Gravida Para Term Preterm AB SAB TAB Ectopic Multiple Living  4 2 2  1  0 1   2    # Outcome Date GA Lbr Len/2nd Weight Sex Delivery Anes PTL Lv  4 Current           3 Term 06/16/13 [redacted]w[redacted]d 10:40 / 05:46 6 lb 12.1 oz (3.065 kg) F CS-LVertical EPI  Y  2 Term 10/10/10 [redacted]w[redacted]d   F Vag-Spont   Y  1 TAB               Past Surgical History: Past Surgical History  Procedure Laterality Date  . Induced abortion  02/2012  . Cholecystectomy  08/28/2012    Procedure: LAPAROSCOPIC CHOLECYSTECTOMY WITH INTRAOPERATIVE CHOLANGIOGRAM;  Surgeon: [name], DO;  Location: [facility] OR;  Service: General;  Laterality: N/A;  . Cesarean section N/A 06/16/2013    Procedure: CESAREAN SECTION;  Surgeon: Reva Bores, MD;  Location: [facility] ORS;  Service: Obstetrics;  Laterality: N/A;    Family History: Family History  Problem Relation Age of Onset  . Other Neg Hx   .  Diabetes Neg Hx   . Heart disease Neg Hx   . Hyperlipidemia Neg Hx   . Hypertension Neg Hx   . Stroke Neg Hx     Social History: Social History  Substance Use Topics  . Smoking status: Never Smoker   . Smokeless tobacco: Never Used  . Alcohol Use: No    Allergies: No Known Allergies  Meds:  Prescriptions prior to admission  Medication Sig Dispense Refill Last Dose  . acetaminophen (TYLENOL) 325 MG tablet Take 650 mg by mouth every 6 (six) hours as needed for headache.   12/27/2015 at Unknown time  . Prenatal Vit-Fe Fumarate-FA (PRENATAL MULTIVITAMIN) TABS tablet Take 1 tablet by mouth daily at 12 noon. 90 tablet 3 12/27/2015 at Unknown time  . progesterone (PROMETRIUM) 200 MG capsule Place 1 capsule (200 mg total) vaginally daily. For preterm labor prevention 30 capsule 2 12/26/2015 at Unknown time    ROS:  Review of Systems  Constitutional: Negative for fever, chills and fatigue.  Respiratory: Negative for shortness of breath.   Cardiovascular: Negative for chest pain.  Genitourinary: Positive for pelvic pain. Negative for dysuria, flank pain, vaginal bleeding, vaginal discharge, difficulty urinating and vaginal pain.  Neurological: Negative for dizziness and headaches.  Psychiatric/Behavioral: Negative.  I have reviewed patient's Past Medical Hx, Surgical Hx, Family Hx, Social Hx, medications and allergies.   Physical Exam   Patient Vitals for the past 24 hrs:  BP Temp Temp src Pulse Resp SpO2 Height Weight  12/27/15 2035 125/64 mmHg 97.5 F (36.4 C) Oral 81 18 100 %  (1.727 m) 179 lb 3.2 oz (81.285 kg)   Constitutional: Well-developed, well-nourished female in no acute distress.  Cardiovascular: normal rate Respiratory: normal effort GI: Abd soft, non-tender, gravid appropriate for gestational age.  MS: Extremities nontender, no edema, normal ROM Neurologic: Alert and oriented x 4.  GU: Neg CVAT.  Dilation: 1 Effacement (%): 50 Station: -3 Presentation:  Vertex Exam by:: LISA, CNM  FHT:  Baseline 145, moderate variability, accelerations present, no decelerations Contractions: None on toco or to palpation   Labs: Results for orders placed or performed during the hospital encounter of 12/27/15 (from the past 24 hour(s))  Urinalysis, Routine w reflex microscopic (not at [facility])     Status: Abnormal   Collection Time: 12/27/15  8:38 PM  Result Value Ref Range   Color, Urine YELLOW YELLOW   APPearance CLEAR CLEAR   Specific Gravity, Urine 1.020 1.005 - 1.030   pH 6.5 5.0 - 8.0   Glucose, UA NEGATIVE NEGATIVE mg/dL   Hgb urine dipstick NEGATIVE NEGATIVE   Bilirubin Urine NEGATIVE NEGATIVE   Ketones, ur NEGATIVE NEGATIVE mg/dL   Protein, ur NEGATIVE NEGATIVE mg/dL   Nitrite NEGATIVE NEGATIVE   Leukocytes, UA TRACE (A) NEGATIVE  Urine microscopic-add on     Status: Abnormal   Collection Time: 12/27/15  8:38 PM  Result Value Ref Range   Squamous Epithelial / LPF 0-5 (A) NONE SEEN   WBC, UA 0-5 0 - 5 WBC/hpf   RBC / HPF 0-5 0 - 5 RBC/hpf   Bacteria, UA FEW (A) NONE SEEN   Urine-Other AMORPHOUS URATES/PHOSPHATES    A/POS/-- (08/30 1033)   MAU Course/MDM: FHR tracing reviewed and NST reactive, no contractions noted on toco or palpable on exam.  Cervix evaluated by digital exam and found to be 1cm/50% effaced with fetus in vertex position.  No prior digital exam performed.  Limited OB/Transvaginal US pending.   Assessment: 1. Preterm contractions, third trimester   2. Short cervix affecting pregnancy   3. Generalized abdominal pain   4. Previous cesarean delivery, antepartum     Report to Karyl Kinnier, MD, with [place] results pending  Follow-up Information    Follow up with [facility] In 1 day.   Specialty:  Obstetrics and Gynecology   Why:  Scheduled appt   Contact information:   [address] [place] [address] [phone]     Sharen Counter Certified Nurse-Midwife 12/27/2015 10:23 PM    10:23 PM Assumed care from [facility]. Reviewed preliminary report from [place]. CL =2.4cm with no funneling.  Reviewed return precautions. Discharge home. Will stop Prometrium as patient no longer meets criteria for shortened cervix. Patient has follow up scheduled in clinic.  Federico Flake, MD

## 2015-12-27 NOTE — MAU Note (Signed)
Pt c/o lower abdominal cramping and contractions that started last night but has gotten worse throughout the day. Rates 7/10. Took tylenol around 10am-did not help. Denies LOF or vag bleeding. Noticed blood in stool last night-some mild constipation. Denies urinary s/s. +FM

## 2015-12-28 ENCOUNTER — Encounter: Payer: Self-pay | Admitting: Advanced Practice Midwife

## 2015-12-28 ENCOUNTER — Ambulatory Visit (INDEPENDENT_AMBULATORY_CARE_PROVIDER_SITE_OTHER): Payer: Medicaid Other | Admitting: Advanced Practice Midwife

## 2015-12-28 VITALS — BP 121/63 | HR 72 | Wt 177.9 lb

## 2015-12-28 DIAGNOSIS — Z23 Encounter for immunization: Secondary | ICD-10-CM

## 2015-12-28 DIAGNOSIS — O26879 Cervical shortening, unspecified trimester: Secondary | ICD-10-CM

## 2015-12-28 DIAGNOSIS — Z3483 Encounter for supervision of other normal pregnancy, third trimester: Secondary | ICD-10-CM | POA: Diagnosis not present

## 2015-12-28 DIAGNOSIS — O34219 Maternal care for unspecified type scar from previous cesarean delivery: Secondary | ICD-10-CM | POA: Diagnosis not present

## 2015-12-28 LAB — POCT URINALYSIS DIP (DEVICE)
Glucose, UA: NEGATIVE mg/dL
HGB URINE DIPSTICK: NEGATIVE
KETONES UR: NEGATIVE mg/dL
NITRITE: NEGATIVE
PH: 6.5 (ref 5.0–8.0)
Protein, ur: 30 mg/dL — AB
SPECIFIC GRAVITY, URINE: 1.025 (ref 1.005–1.030)
Urobilinogen, UA: 1 mg/dL (ref 0.0–1.0)

## 2015-12-28 MED ORDER — TETANUS-DIPHTH-ACELL PERTUSSIS 5-2.5-18.5 LF-MCG/0.5 IM SUSP
0.5000 mL | Freq: Once | INTRAMUSCULAR | Status: AC
  Administered 2015-12-28: 0.5 mL via INTRAMUSCULAR

## 2015-12-28 NOTE — Progress Notes (Signed)
Pt was seen @ MAU last night for r/o PTL.  Pt cannot stay today for 28 wk labs today but will come on 1/13 @ 0900.  Breastfeeding tip of the week reviewed. Pt agrees to Tdap vaccine today, still deciding about Flu vaccine.

## 2015-12-28 NOTE — Patient Instructions (Signed)
Try the [employer] at full term to improve baby's position   Preterm Birth Preterm birth is a birth that happens before 37 weeks of pregnancy. Most pregnancies last about 39-41 weeks. Every week in the womb is important and is beneficial to the health of the infant. Infants born before 37 weeks of pregnancy are at a higher risk for complications. Depending on when the infant was born, he or she may be:  Late preterm. Born between 32 weeks and 37 weeks of pregnancy.  Very preterm. Born at less than 32 weeks of pregnancy.  Extremely preterm. Born at less than 25 weeks of pregnancy. The earlier a baby is born, the more likely the child will have issues related to prematurity. Complications and problems that can be seen in infants born too early include:  Problems breathing (respiratory distress syndrome).  Low birth weight.  Problems feeding.  Sleeping problems.  Yellowing of the skin (jaundice).  Infections such as pneumonia. Babies born very preterm or extremely preterm are at risk for more serious medical issues. These include:  More severe breathing issues.  Eyesight issues.  Brain development issues (intraventricular hemorrhage).  Behavioral and emotional development issues.  Growth and developmental delays.  Cerebral palsy.  Serious feeding or bowel complications (necrotizing enterocolitis). CAUSES  There are two broad categories of preterm birth.  Spontaneous preterm birth. This is a birth resulting from preterm labor (not medically induced) or preterm premature rupture of membranes (PPROM).  Indicated preterm birth. This is a birth resulting from labor being medically induced due to health, personal, or social reasons. RISK FACTORS Preterm birth may be related to certain medical conditions, lifestyle factors, or demographic factors encountered by the mother or fetus.  Medical conditions include:  Multiple gestations (twins, triplets, and so  on).  Infection.  Diabetes.  Heart disease.  Kidney disease.  Cervical or uterine abnormalities.  Being underweight.  High blood pressure or preeclampsia.  Premature rupture of membranes (PROM).  Birth defects in the fetus.  Lifestyle factors include:  Poor prenatal care.  Poor nutrition or anemia.  Cigarette smoking.  Consuming alcohol.  High levels of stress and lack of social or emotional support.  Exposure to chemical or environmental toxins.  Substance abuse.  Demographic factors include:  African-American ethnicity.  Age (younger than 30 or older than 29 years of age).  Low socioeconomic status. Women with a history of preterm labor or who become pregnant within 46 months of giving birth are also at increased risk for preterm birth. DIAGNOSIS  Your health care provider may request additional tests to diagnose underlying complications resulting from preterm birth. Tests on the infant may include:  Physical exam.  Blood tests.  Chest X-rays.  Heart-lung monitoring. TREATMENT  After birth, special care will be taken to assess any problems or complications for the infant. Supportive care will be provided for the infant. Treatment depends on what problems are present and any complications that develop. Some preterm infants are cared for in a neonatal intensive care unit. In general, care may include:  Maintaining temperature and oxygen in a clear heated box (baby isolette).  Monitoring the infant's heart rate, breathing, and level of oxygen in the blood.  Monitoring for signs of infection and, if needed, giving IV antibiotic medicine.  Inserting a feeding tube (nose, mouth) or giving IV nutrition if unable to feed.  Inserting a breathing tube (ventilation).  Respiration support (continuous positive airway pressure [CPAP] or oxygen). Treatment will change as the infant builds  up strength and is able to breathe and eat on his or her own. For some  infants, no special treatment is necessary. Parents may be educated on the potential health risks of prematurity to the infant. HOME CARE INSTRUCTIONS  Understand your infant's special conditions and needs. It may be reassuring to learn about infant CPR.  Monitor your infant in the car seat until he or she grows and matures. Infant car seats can cause breathing difficulties for preterm infants.  Keep your infant warm. Dress your infant in layers and keep him or her away from drafts, especially in cold months of the year.  Wash your hands thoroughly after going to the bathroom or changing a diaper. Late preterm infants may be more prone to infection.  Follow all your health care provider's instructions for providing support and care to your preterm infant.  Get support from organizations and groups that understand your challenges.  Follow up with your infant's health care provider as directed. Prevention There are some things you can do to help lower your risk of having a preterm infant in the future. These include:  Good prenatal care throughout the entire pregnancy. See a health care provider regularly for advice and tests.  Management of underlying medical conditions.  Proper self-care and lifestyle changes.  Proper diet and weight control.  Watching for signs of various infections. SEEK MEDICAL CARE IF:  Your infant has feeding difficulties.  Your infant has sleeping difficulties.  Your infant has breathing difficulties.  Your infant's skin starts to look yellow.  Your infant shows signs of infection, such as a stuffy nose, fever, crying, or bluish color of the skin. FOR MORE INFORMATION March of Dimes: www.marchofdimes.com Prematurity.org: www.prematurity.org   This information is not intended to replace advice given to you by your health care provider. Make sure you discuss any questions you have with your health care provider.   Document Released: 02/23/2004  Document Revised: 09/23/2013 Document Reviewed: 07/02/2013 Elsevier Interactive Patient Education [employer]

## 2015-12-28 NOTE — Progress Notes (Signed)
Subjective:  Katherine Lindsey is a 30 y.o. [phone] at [redacted]w[redacted]d being seen today for ongoing prenatal care.  She is currently monitored for the following issues for this low-risk pregnancy and has Hammertoe; Encounter for supervision of other normal pregnancy; Short cervix affecting pregnancy; and Previous cesarean delivery, antepartum on her problem list.  Pt was considered high risk and transferred from [facility] to [facility] for short cervix at 1.2 cm on 2 ultrasounds starting in late November.  However, later ultrasounds on 12/21 and yesterday in MAU revealed stable cervical length at 2.4 cm so restrictions removed, pt now low risk pregnancy with normal precautions.  Patient reports no complaints.  Contractions: Irregular. Vag. Bleeding: None.  Movement: Present. Denies leaking of fluid.   The following portions of the patient's history were reviewed and updated as appropriate: allergies, current medications, past family history, past medical history, past social history, past surgical history and problem list. Problem list updated.  Objective:   Filed Vitals:   12/28/15 0812  BP: 121/63  Pulse: 72  Weight: 177 lb 14.4 oz (80.695 kg)    Fetal Status: Fetal Heart Rate (bpm): 142   Movement: Present     General:  Alert, oriented and cooperative. Patient is in no acute distress.  Skin: Skin is warm and dry. No rash noted.   Cardiovascular: Normal heart rate noted  Respiratory: Normal respiratory effort, no problems with respiration noted  Abdomen: Soft, gravid, appropriate for gestational age. Pain/Pressure: Present     Pelvic: Vag. Bleeding: None     Cervical exam deferred        Extremities: Normal range of motion.  Edema: None  Mental Status: Normal mood and affect. Normal behavior. Normal judgment and thought content.   Urinalysis: Urine Protein: 1+ Urine Glucose: Negative  Assessment and Plan:  Pregnancy: [id number] at [redacted]w[redacted]d  1. Encounter for supervision of other normal pregnancy  in third trimester --Reviewed yesterday's ultrasound with pt and stable 2.4 cm cervix.  Cervix 1/50/-3 in MAU yesterday.  Routine precautions given.  Pt does not have to be on pelvic rest and may exercise but to be cautious stop if causing contractions.  Stay hydrated.  F/U with 28 week labs later this week, then visits every 2 weeks.  Preterm labor symptoms and general obstetric precautions including but not limited to vaginal bleeding, contractions, leaking of fluid and fetal movement were reviewed in detail with the patient. Please refer to After Visit Summary for other counseling recommendations.  Return in about 2 weeks (around 01/11/2016) for 28 wk labs only.   Hurshel Party, CNM

## 2015-12-29 ENCOUNTER — Ambulatory Visit (HOSPITAL_COMMUNITY): Payer: Medicaid Other

## 2015-12-29 LAB — URINE CULTURE

## 2015-12-30 ENCOUNTER — Encounter: Payer: Self-pay | Admitting: *Deleted

## 2015-12-30 ENCOUNTER — Other Ambulatory Visit: Payer: Medicaid Other

## 2016-01-11 ENCOUNTER — Encounter: Payer: Self-pay | Admitting: Advanced Practice Midwife

## 2016-01-11 ENCOUNTER — Ambulatory Visit (INDEPENDENT_AMBULATORY_CARE_PROVIDER_SITE_OTHER): Payer: Medicaid Other | Admitting: Obstetrics and Gynecology

## 2016-01-11 VITALS — BP 107/54 | HR 73 | Temp 98.3°F | Wt 176.3 lb

## 2016-01-11 DIAGNOSIS — Z3483 Encounter for supervision of other normal pregnancy, third trimester: Secondary | ICD-10-CM

## 2016-01-11 LAB — POCT URINALYSIS DIP (DEVICE)
BILIRUBIN URINE: NEGATIVE
BILIRUBIN URINE: NEGATIVE
Glucose, UA: NEGATIVE mg/dL
Glucose, UA: NEGATIVE mg/dL
HGB URINE DIPSTICK: NEGATIVE
KETONES UR: NEGATIVE mg/dL
Ketones, ur: NEGATIVE mg/dL
NITRITE: NEGATIVE
Nitrite: NEGATIVE
PH: 6.5 (ref 5.0–8.0)
PH: 6.5 (ref 5.0–8.0)
PROTEIN: NEGATIVE mg/dL
Protein, ur: NEGATIVE mg/dL
SPECIFIC GRAVITY, URINE: 1.02 (ref 1.005–1.030)
Specific Gravity, Urine: 1.015 (ref 1.005–1.030)
Urobilinogen, UA: 1 mg/dL (ref 0.0–1.0)
Urobilinogen, UA: 0.2 mg/dL (ref 0.0–1.0)

## 2016-01-11 NOTE — Patient Instructions (Signed)

## 2016-01-11 NOTE — Progress Notes (Signed)
Subjective:  Katherine Lindsey is a 30 y.o. [phone] at [redacted]w[redacted]d being seen today for ongoing prenatal care.  She is currently monitored for the following issues for this low-risk pregnancy and has Hammertoe; Encounter for supervision of other normal pregnancy; Short cervix affecting pregnancy; and Previous cesarean delivery, antepartum on her problem list. Still desires TOLAC. No PTL sx.   Patient reports no complaints.  Contractions: Irregular. Vag. Bleeding: None.  Movement: Present. Denies leaking of fluid.   The following portions of the patient's history were reviewed and updated as appropriate: allergies, current medications, past family history, past medical history, past social history, past surgical history and problem list. Problem list updated.  Objective:   Filed Vitals:   01/11/16 1037  BP: 107/54  Pulse: 73  Temp: 98.3 F (36.8 C)  Weight: 176 lb 4.8 oz (79.969 kg)    Fetal Status: Fetal Heart Rate (bpm): 154   Movement: Present     General:  Alert, oriented and cooperative. Patient is in no acute distress.  Skin: Skin is warm and dry. No rash noted.   Cardiovascular: Normal heart rate noted  Respiratory: Normal respiratory effort, no problems with respiration noted  Abdomen: Soft, gravid, appropriate for gestational age. Pain/Pressure: Present     Pelvic: Vag. Bleeding: None     Cervical exam deferred        Extremities: Normal range of motion.  Edema: None  Mental Status: Normal mood and affect. Normal behavior. Normal judgment and thought content.   Urinalysis: Urine Protein: Negative Urine Glucose: Negative  Assessment and Plan:  Pregnancy: [id number] at [redacted]w[redacted]d  1. Encounter for supervision of other normal pregnancy in third trimester  Previous LTCS with P2. Op note reviewed.  Short cx - resolved   Preterm labor symptoms and general obstetric precautions including but not limited to vaginal bleeding, contractions, leaking of fluid and fetal movement were reviewed  in detail with the patient. Please refer to After Visit Summary for other counseling recommendations.  Return in about 2 weeks (around 01/25/2016) for LOB. 1 hr GCT tomorrow   Danae Orleans, CNM

## 2016-01-11 NOTE — Progress Notes (Signed)
Breastfeeding tip of the week reviewed.  Pt cannot stay for 1hr GTT today - will return tomorrow.

## 2016-01-12 ENCOUNTER — Other Ambulatory Visit: Payer: Medicaid Other

## 2016-01-12 DIAGNOSIS — Z3483 Encounter for supervision of other normal pregnancy, third trimester: Secondary | ICD-10-CM

## 2016-01-12 LAB — CBC
HCT: 32.3 % — ABNORMAL LOW (ref 36.0–46.0)
Hemoglobin: 10.7 g/dL — ABNORMAL LOW (ref 12.0–15.0)
MCH: 28.2 pg (ref 26.0–34.0)
MCHC: 33.1 g/dL (ref 30.0–36.0)
MCV: 85.2 fL (ref 78.0–100.0)
MPV: 9.9 fL (ref 8.6–12.4)
PLATELETS: 223 10*3/uL (ref 150–400)
RBC: 3.79 MIL/uL — ABNORMAL LOW (ref 3.87–5.11)
RDW: 14 % (ref 11.5–15.5)
WBC: 6.6 10*3/uL (ref 4.0–10.5)

## 2016-01-13 LAB — RPR

## 2016-01-13 LAB — GLUCOSE TOLERANCE, 1 HOUR (50G) W/O FASTING: GLUCOSE 1 HOUR GTT: 115 mg/dL (ref 70–140)

## 2016-01-13 LAB — HIV ANTIBODY (ROUTINE TESTING W REFLEX): HIV: NONREACTIVE

## 2016-01-25 ENCOUNTER — Encounter: Payer: Medicaid Other | Admitting: Family

## 2016-01-31 ENCOUNTER — Ambulatory Visit (INDEPENDENT_AMBULATORY_CARE_PROVIDER_SITE_OTHER): Payer: Medicaid Other | Admitting: Advanced Practice Midwife

## 2016-01-31 VITALS — BP 119/64 | HR 72 | Temp 97.7°F | Wt 175.0 lb

## 2016-01-31 DIAGNOSIS — O34219 Maternal care for unspecified type scar from previous cesarean delivery: Secondary | ICD-10-CM

## 2016-01-31 DIAGNOSIS — Z3483 Encounter for supervision of other normal pregnancy, third trimester: Secondary | ICD-10-CM | POA: Diagnosis present

## 2016-01-31 LAB — POCT URINALYSIS DIP (DEVICE)
BILIRUBIN URINE: NEGATIVE
GLUCOSE, UA: NEGATIVE mg/dL
HGB URINE DIPSTICK: NEGATIVE
KETONES UR: NEGATIVE mg/dL
Nitrite: NEGATIVE
Protein, ur: 30 mg/dL — AB
SPECIFIC GRAVITY, URINE: 1.02 (ref 1.005–1.030)
Urobilinogen, UA: 2 mg/dL — ABNORMAL HIGH (ref 0.0–1.0)
pH: 7 (ref 5.0–8.0)

## 2016-01-31 NOTE — Progress Notes (Signed)
Subjective:  Katherine Lindsey is a 30 y.o. [phone] at [redacted]w[redacted]d being seen today for ongoing prenatal care.  She is currently monitored for the following issues for this low-risk pregnancy and has Hammertoe; Encounter for supervision of other normal pregnancy; Short cervix affecting pregnancy; and Previous cesarean delivery, antepartum on her problem list.  Patient reports no complaints.  Contractions: Irregular. Vag. Bleeding: None.  Movement: Present. Denies leaking of fluid.   The following portions of the patient's history were reviewed and updated as appropriate: allergies, current medications, past family history, past medical history, past social history, past surgical history and problem list. Problem list updated.  Objective:   Filed Vitals:   01/31/16 0926  BP: 119/64  Pulse: 72  Temp: 97.7 F (36.5 C)  Weight: 175 lb (79.379 kg)    Fetal Status: Fetal Heart Rate (bpm): 140 Fundal Height: 34 cm Movement: Present     General:  Alert, oriented and cooperative. Patient is in no acute distress.  Skin: Skin is warm and dry. No rash noted.   Cardiovascular: Normal heart rate noted  Respiratory: Normal respiratory effort, no problems with respiration noted  Abdomen: Soft, gravid, appropriate for gestational age. Pain/Pressure: Absent     Pelvic: Vag. Bleeding: None     Cervical exam deferred        Extremities: Normal range of motion.  Edema: None  Mental Status: Normal mood and affect. Normal behavior. Normal judgment and thought content.   Urinalysis: Urine Protein: 1+ Urine Glucose: Negative  Assessment and Plan:  Pregnancy: [id number] at [redacted]w[redacted]d  1. Encounter for supervision of other normal pregnancy in third trimester   2. Previous cesarean delivery, antepartum --Desires TOLAC. Consent in chart.  Preterm labor symptoms and general obstetric precautions including but not limited to vaginal bleeding, contractions, leaking of fluid and fetal movement were reviewed in detail  with the patient. Please refer to After Visit Summary for other counseling recommendations.  No Follow-up on file.   Hurshel Party, CNM

## 2016-02-01 ENCOUNTER — Encounter: Payer: Self-pay | Admitting: Family Medicine

## 2016-02-15 ENCOUNTER — Ambulatory Visit (INDEPENDENT_AMBULATORY_CARE_PROVIDER_SITE_OTHER): Payer: Medicaid Other | Admitting: Family Medicine

## 2016-02-15 ENCOUNTER — Encounter: Payer: Self-pay | Admitting: Family Medicine

## 2016-02-15 ENCOUNTER — Other Ambulatory Visit (HOSPITAL_COMMUNITY)
Admission: RE | Admit: 2016-02-15 | Discharge: 2016-02-15 | Disposition: A | Discharge status: Home / Self Care | Payer: Medicaid Other | Source: Ambulatory Visit | Attending: Advanced Practice Midwife | Admitting: Advanced Practice Midwife

## 2016-02-15 ENCOUNTER — Encounter: Payer: Self-pay | Admitting: *Deleted

## 2016-02-15 VITALS — BP 124/63 | HR 78 | Temp 98.7°F | Wt 179.4 lb

## 2016-02-15 DIAGNOSIS — O26873 Cervical shortening, third trimester: Secondary | ICD-10-CM

## 2016-02-15 DIAGNOSIS — Z113 Encounter for screening for infections with a predominantly sexual mode of transmission: Secondary | ICD-10-CM | POA: Diagnosis present

## 2016-02-15 DIAGNOSIS — Z3493 Encounter for supervision of normal pregnancy, unspecified, third trimester: Secondary | ICD-10-CM

## 2016-02-15 DIAGNOSIS — O26879 Cervical shortening, unspecified trimester: Secondary | ICD-10-CM

## 2016-02-15 DIAGNOSIS — O34219 Maternal care for unspecified type scar from previous cesarean delivery: Secondary | ICD-10-CM

## 2016-02-15 LAB — POCT URINALYSIS DIP (DEVICE)
Bilirubin Urine: NEGATIVE
GLUCOSE, UA: NEGATIVE mg/dL
HGB URINE DIPSTICK: NEGATIVE
KETONES UR: NEGATIVE mg/dL
Nitrite: NEGATIVE
Protein, ur: NEGATIVE mg/dL
SPECIFIC GRAVITY, URINE: 1.025 (ref 1.005–1.030)
Urobilinogen, UA: 1 mg/dL (ref 0.0–1.0)
pH: 6.5 (ref 5.0–8.0)

## 2016-02-15 LAB — OB RESULTS CONSOLE GBS: GBS: POSITIVE

## 2016-02-15 NOTE — Progress Notes (Signed)
Educated pt

## 2016-02-15 NOTE — Progress Notes (Signed)
Subjective:  Katherine Lindsey is a 30 y.o. [phone] at [redacted]w[redacted]d being seen today for ongoing prenatal care.  She is currently monitored for the following issues for this high-risk pregnancy and has Hammertoe; Supervision of low-risk pregnancy; Short cervix affecting pregnancy; and Previous cesarean delivery, antepartum on her problem list.  Patient reports no complaints.  Contractions: Irregular. Vag. Bleeding: None.  Movement: Present. Denies leaking of fluid.   The following portions of the patient's history were reviewed and updated as appropriate: allergies, current medications, past family history, past medical history, past social history, past surgical history and problem list. Problem list updated.  Objective:   Filed Vitals:   02/15/16 1026  BP: 124/63  Pulse: 78  Temp: 98.7 F (37.1 C)  Weight: 179 lb 6.4 oz (81.375 kg)    Fetal Status:     Movement: Present     General:  Alert, oriented and cooperative. Patient is in no acute distress.  Skin: Skin is warm and dry. No rash noted.   Cardiovascular: Normal heart rate noted  Respiratory: Normal respiratory effort, no problems with respiration noted  Abdomen: Soft, gravid, appropriate for gestational age. Pain/Pressure: Present     Pelvic: Vag. Bleeding: None     Cervical exam deferred        Extremities: Normal range of motion.  Edema: None  Mental Status: Normal mood and affect. Normal behavior. Normal judgment and thought content.   Urinalysis: Urine Protein: Negative Urine Glucose: Negative  Assessment and Plan:  Pregnancy: [id number] at [redacted]w[redacted]d  1. Short cervix affecting pregnancy  2. Previous cesarean delivery, antepartum -confirmed TOLAC, last CS was for arrest of descent with OP infant - Culture, beta strep (group b only) - GC/Chlamydia probe amp ([facility])not at [facility]  3. Supervision of low-risk pregnancy, third trimester UTD Collected GBS today Collected GC/CT  Preterm labor symptoms and general obstetric  precautions including but not limited to vaginal bleeding, contractions, leaking of fluid and fetal movement were reviewed in detail with the patient. Please refer to After Visit Summary for other counseling recommendations.  Return in about 1 week (around 02/22/2016) for Routine prenatal care.   Federico Flake, MD

## 2016-02-16 LAB — GC/CHLAMYDIA PROBE AMP (~~LOC~~) NOT AT ARMC
Chlamydia: NEGATIVE
Neisseria Gonorrhea: NEGATIVE

## 2016-02-17 LAB — CULTURE, BETA STREP (GROUP B ONLY)

## 2016-02-20 ENCOUNTER — Telehealth: Payer: Self-pay | Admitting: Family Medicine

## 2016-02-20 NOTE — Telephone Encounter (Signed)
Would like to get a note saying she can work 30 hours not 40. 40 is too much right now.

## 2016-02-22 ENCOUNTER — Encounter: Payer: Self-pay | Admitting: Advanced Practice Midwife

## 2016-02-22 ENCOUNTER — Ambulatory Visit (INDEPENDENT_AMBULATORY_CARE_PROVIDER_SITE_OTHER): Payer: Medicaid Other | Admitting: Advanced Practice Midwife

## 2016-02-22 VITALS — BP 120/73 | HR 73 | Wt 180.0 lb

## 2016-02-22 DIAGNOSIS — O34219 Maternal care for unspecified type scar from previous cesarean delivery: Secondary | ICD-10-CM | POA: Diagnosis not present

## 2016-02-22 DIAGNOSIS — Z3483 Encounter for supervision of other normal pregnancy, third trimester: Secondary | ICD-10-CM | POA: Diagnosis not present

## 2016-02-22 LAB — POCT URINALYSIS DIP (DEVICE)
Bilirubin Urine: NEGATIVE
Glucose, UA: NEGATIVE mg/dL
Hgb urine dipstick: NEGATIVE
KETONES UR: NEGATIVE mg/dL
Nitrite: NEGATIVE
PH: 7 (ref 5.0–8.0)
PROTEIN: NEGATIVE mg/dL
Specific Gravity, Urine: 1.02 (ref 1.005–1.030)
UROBILINOGEN UA: 1 mg/dL (ref 0.0–1.0)

## 2016-02-22 NOTE — Patient Instructions (Signed)

## 2016-02-22 NOTE — Progress Notes (Signed)
Subjective:  Katherine Lindsey is a 30 y.o. [phone] at [redacted]w[redacted]d being seen today for ongoing prenatal care.  She is currently monitored for the following issues for this low-risk pregnancy and has Hammertoe; Supervision of low-risk pregnancy; Short cervix affecting pregnancy; and Previous cesarean delivery, antepartum on her problem list.  Patient reports occasional contractions.  Contractions: Irregular. Vag. Bleeding: None.  Movement: Present. Denies leaking of fluid.   The following portions of the patient's history were reviewed and updated as appropriate: allergies, current medications, past family history, past medical history, past social history, past surgical history and problem list. Problem list updated.  Objective:   Filed Vitals:   02/22/16 1030  BP: 120/73  Pulse: 73  Weight: 180 lb (81.647 kg)    Fetal Status: Fetal Heart Rate (bpm): 140   Movement: Present  Presentation: Vertex  General:  Alert, oriented and cooperative. Patient is in no acute distress.  Skin: Skin is warm and dry. No rash noted.   Cardiovascular: Normal heart rate noted  Respiratory: Normal respiratory effort, no problems with respiration noted  Abdomen: Soft, gravid, appropriate for gestational age. Pain/Pressure: Present     Pelvic: Vag. Bleeding: None     Cervical exam performed at pt request. Dilation: 2 Effacement (%): 0 Station: -3  Extremities: Normal range of motion.  Edema: None  Mental Status: Normal mood and affect. Normal behavior. Normal judgment and thought content.   Urinalysis: Urine Protein: Negative Urine Glucose: Negative  Assessment and Plan:  Pregnancy: [id number] at [redacted]w[redacted]d  1. Previous cesarean delivery, antepartum   Term labor symptoms and general obstetric precautions including but not limited to vaginal bleeding, contractions, leaking of fluid and fetal movement were reviewed in detail with the patient. Please refer to After Visit Summary for other counseling recommendations.   Return in about 1 week (around 02/29/2016).   Hurshel Party, CNM

## 2016-02-28 NOTE — Telephone Encounter (Signed)
Letter given at 3/8 appt.

## 2016-03-07 ENCOUNTER — Ambulatory Visit (INDEPENDENT_AMBULATORY_CARE_PROVIDER_SITE_OTHER): Payer: Medicaid Other | Admitting: Family Medicine

## 2016-03-07 ENCOUNTER — Encounter: Payer: Self-pay | Admitting: Family Medicine

## 2016-03-07 VITALS — BP 127/73 | HR 78 | Temp 98.2°F | Wt 184.0 lb

## 2016-03-07 DIAGNOSIS — O26879 Cervical shortening, unspecified trimester: Secondary | ICD-10-CM

## 2016-03-07 DIAGNOSIS — O26873 Cervical shortening, third trimester: Secondary | ICD-10-CM

## 2016-03-07 DIAGNOSIS — Z3493 Encounter for supervision of normal pregnancy, unspecified, third trimester: Secondary | ICD-10-CM

## 2016-03-07 DIAGNOSIS — O99820 Streptococcus B carrier state complicating pregnancy: Secondary | ICD-10-CM | POA: Insufficient documentation

## 2016-03-07 DIAGNOSIS — O34219 Maternal care for unspecified type scar from previous cesarean delivery: Secondary | ICD-10-CM

## 2016-03-07 DIAGNOSIS — Z22330 Carrier of Group B streptococcus: Secondary | ICD-10-CM

## 2016-03-07 LAB — POCT URINALYSIS DIP (DEVICE)
Glucose, UA: NEGATIVE mg/dL
HGB URINE DIPSTICK: NEGATIVE
Ketones, ur: NEGATIVE mg/dL
LEUKOCYTES UA: NEGATIVE
Nitrite: NEGATIVE
Protein, ur: 30 mg/dL — AB
SPECIFIC GRAVITY, URINE: 1.025 (ref 1.005–1.030)
UROBILINOGEN UA: 1 mg/dL (ref 0.0–1.0)
pH: 6.5 (ref 5.0–8.0)

## 2016-03-07 NOTE — Patient Instructions (Addendum)
Come to the MAU (maternity admission unit) for 1) Strong contractions every 2-3 minutes for at least 1 hour that do not go away when you drink water or take a warm shower. These contractions will be so strong all you can do is breath through them 2) Vaginal bleeding- anything more than spotting 3) Loss of fluid like you broke your water 4) Decreased movement of your baby   Vaginal Birth After Cesarean Delivery Vaginal birth after cesarean delivery (VBAC) is giving birth vaginally after previously delivering a baby by a cesarean. In the past, if a woman had a cesarean delivery, all births afterward would be done by cesarean delivery. This is no longer true. It can be safe for the mother to try a vaginal delivery after having a cesarean delivery.  It is important to discuss VBAC with your health care provider early in the pregnancy so you can understand the risks, benefits, and options. It will give you time to decide what is best in your particular case. The final decision about whether to have a VBAC or repeat cesarean delivery should be between you and your health care provider. Any changes in your health or your baby's health during your pregnancy may make it necessary to change your initial decision about VBAC.  WOMEN WHO PLAN TO HAVE A VBAC SHOULD CHECK WITH THEIR HEALTH CARE PROVIDER TO BE SURE THAT:  The previous cesarean delivery was done with a low transverse uterine cut (incision) (not a vertical classical incision).   The birth canal is big enough for the baby.   There were no other operations on the uterus.   An electronic fetal monitor (EFM) will be on at all times during labor.   An operating room will be available and ready in case an emergency cesarean delivery is needed.   A health care provider and surgical nursing staff will be available at all times during labor to be ready to do an emergency delivery cesarean if necessary.   An anesthesiologist will be present in  case an emergency cesarean delivery is needed.   The nursery is prepared and has adequate personnel and necessary equipment available to care for the baby in case of an emergency cesarean delivery. BENEFITS OF VBAC  Shorter stay in the hospital.   Avoidance of risks associated with cesarean delivery, such as:  Surgical complications, such as opening of the incision or hernia in the incision.  Injury to other organs.  Fever. This can occur if an infection develops after surgery. It can also occur as a reaction to the medicine given to make you numb during the surgery.  Less blood loss and need for blood transfusions.  Lower risk of blood clots and infection.  Shorter recovery.   Decreased risk for having to remove the uterus (hysterectomy).   Decreased risk for the placenta to completely or partially cover the opening of the uterus (placenta previa) with a future pregnancy.   Decrease risk in future labor and delivery. RISKS OF A VBAC  Tearing (rupture) of the uterus. This is occurs in less than 1% of VBACs. The risk of this happening is higher if:  Steps are taken to begin the labor process (induce labor) or stimulate or strengthen contractions (augment labor).   Medicine is used to soften (ripen) the cervix.  Having to remove the uterus (hysterectomy) if it ruptures. VBAC SHOULD NOT BE DONE IF:  The previous cesarean delivery was done with a vertical (classical) or T-shaped incision or you  do not know what kind of incision was made.   You had a ruptured uterus.   You have had certain types of surgery on your uterus, such as removal of uterine fibroids. Ask your health care provider about other types of surgeries that prevent you from having a VBAC.  You have certain medical or childbirth (obstetrical) problems.   There are problems with the baby.   You have had two previous cesarean deliveries and no vaginal deliveries. OTHER FACTS TO KNOW ABOUT VBAC:  It  is safe to have an epidural anesthetic with VBAC.   It is safe to turn the baby from a breech position (attempt an external cephalic version).   It is safe to try a VBAC with twins.   VBAC may not be successful if your baby weights 8.8 lb (4 kg) or more. However, weight predictions are not always accurate and should not be used alone to decide if VBAC is right for you.  There is an increased failure rate if the time between the cesarean delivery and VBAC is less than 19 months.   Your health care provider may advise against a VBAC if you have preeclampsia (high blood pressure, protein in the urine, and swelling of face and extremities).   VBAC is often successful if you previously gave birth vaginally.   VBAC is often successful when the labor starts spontaneously before the due date.   Delivering a baby through a VBAC is similar to having a normal spontaneous vaginal delivery.   This information is not intended to replace advice given to you by your health care provider. Make sure you discuss any questions you have with your health care provider.   Document Released: 05/26/2007 Document Revised: 12/24/2014 Document Reviewed: 07/02/2013 Elsevier Interactive Patient Education [employer]

## 2016-03-07 NOTE — Progress Notes (Signed)
Educated pt on [employer] [place] 

## 2016-03-07 NOTE — Progress Notes (Signed)
Subjective:  Katherine Lindsey is a 30 y.o. [phone] at [redacted]w[redacted]d being seen today for ongoing prenatal care.  She is currently monitored for the following issues for this low-risk pregnancy and has Hammertoe; Supervision of low-risk pregnancy; Short cervix affecting pregnancy; Previous cesarean delivery, antepartum; and GBS (group B Streptococcus carrier), +RV culture, currently pregnant on her problem list.  Patient reports no complaints.  Contractions: Irregular. Vag. Bleeding: None.  Movement: Present. Denies leaking of fluid.   The following portions of the patient's history were reviewed and updated as appropriate: allergies, current medications, past family history, past medical history, past social history, past surgical history and problem list. Problem list updated.  Objective:   Filed Vitals:   03/07/16 0956  BP: 127/73  Pulse: 78  Temp: 98.2 F (36.8 C)  Weight: 184 lb (83.462 kg)    Fetal Status: Fetal Heart Rate (bpm): 138 Fundal Height: 38 cm Movement: Present  Presentation: Vertex  General:  Alert, oriented and cooperative. Patient is in no acute distress.  Skin: Skin is warm and dry. No rash noted.   Cardiovascular: Normal heart rate noted  Respiratory: Normal respiratory effort, no problems with respiration noted  Abdomen: Soft, gravid, appropriate for gestational age. Pain/Pressure: Present     Pelvic: Vag. Bleeding: None Vag D/C Character: Mucous   Cervical exam deferred        Extremities: Normal range of motion.  Edema: None  Mental Status: Normal mood and affect. Normal behavior. Normal judgment and thought content.   Urinalysis:      Assessment and Plan:  Pregnancy: [id number] at [redacted]w[redacted]d  1. Previous cesarean delivery, antepartum -Tolac siged  2. Short cervix affecting pregnancy -term thus not concerning at this point  3. Supervision of low-risk pregnancy, third trimester Updated box Reviewed labor  Term labor symptoms and general obstetric precautions  including but not limited to vaginal bleeding, contractions, leaking of fluid and fetal movement were reviewed in detail with the patient. Please refer to After Visit Summary for other counseling recommendations.   Return in about 1 week (around 03/14/2016) for Routine prenatal care.   Federico Flake, MD

## 2016-03-13 ENCOUNTER — Ambulatory Visit (INDEPENDENT_AMBULATORY_CARE_PROVIDER_SITE_OTHER): Payer: Medicaid Other | Admitting: Family Medicine

## 2016-03-13 VITALS — BP 133/7 | HR 77 | Temp 98.5°F | Wt 186.0 lb

## 2016-03-13 DIAGNOSIS — O26879 Cervical shortening, unspecified trimester: Secondary | ICD-10-CM

## 2016-03-13 DIAGNOSIS — O34219 Maternal care for unspecified type scar from previous cesarean delivery: Secondary | ICD-10-CM | POA: Diagnosis not present

## 2016-03-13 DIAGNOSIS — Z3493 Encounter for supervision of normal pregnancy, unspecified, third trimester: Secondary | ICD-10-CM

## 2016-03-13 DIAGNOSIS — Z22330 Carrier of Group B streptococcus: Secondary | ICD-10-CM

## 2016-03-13 DIAGNOSIS — O99820 Streptococcus B carrier state complicating pregnancy: Secondary | ICD-10-CM

## 2016-03-13 LAB — POCT URINALYSIS DIP (DEVICE)
BILIRUBIN URINE: NEGATIVE
GLUCOSE, UA: NEGATIVE mg/dL
Hgb urine dipstick: NEGATIVE
KETONES UR: NEGATIVE mg/dL
LEUKOCYTES UA: NEGATIVE
Nitrite: NEGATIVE
Protein, ur: NEGATIVE mg/dL
SPECIFIC GRAVITY, URINE: 1.02 (ref 1.005–1.030)
Urobilinogen, UA: 1 mg/dL (ref 0.0–1.0)
pH: 7 (ref 5.0–8.0)

## 2016-03-13 NOTE — Progress Notes (Signed)
Subjective:  Katherine Lindsey is a 30 y.o. [phone] at [redacted]w[redacted]d being seen today for ongoing prenatal care.  She is currently monitored for the following issues for this low-risk pregnancy and has Hammertoe; Supervision of low-risk pregnancy; Short cervix affecting pregnancy; Previous cesarean delivery, antepartum; and GBS (group B Streptococcus carrier), +RV culture, currently pregnant on her problem list.  Patient reports no complaints. Reports waking up with an panic attack last nigh- reports sweating, heart racing.   Contractions: Irregular. Vag. Bleeding: None.  Movement: Present. Denies leaking of fluid.   The following portions of the patient's history were reviewed and updated as appropriate: allergies, current medications, past family history, past medical history, past social history, past surgical history and problem list. Problem list updated.  Objective:   Filed Vitals:   03/13/16 1350  BP: 133/7  Pulse: 77  Temp: 98.5 F (36.9 C)  Weight: 186 lb (84.369 kg)    Fetal Status: Fetal Heart Rate (bpm): 150 Fundal Height: 37 cm Movement: Present  Presentation: Vertex  General:  Alert, oriented and cooperative. Patient is in no acute distress.  Skin: Skin is warm and dry. No rash noted.   Cardiovascular: Normal heart rate noted  Respiratory: Normal respiratory effort, no problems with respiration noted  Abdomen: Soft, gravid, appropriate for gestational age. Pain/Pressure: Present     Pelvic: Vag. Bleeding: None Vag D/C Character: Mucous   Cervical exam performed Dilation: 3 Effacement (%): 50 Station: -3  Extremities: Normal range of motion.  Edema: None  Mental Status: Normal mood and affect. Normal behavior. Normal judgment and thought content.   Urinalysis: Urine Protein: Negative Urine Glucose: Negative  Assessment and Plan:  Pregnancy: [id number] at [redacted]w[redacted]d  1. Supervision of low-risk pregnancy, third trimester Updated box Swept membranes Favorable cervix  2. Short  cervix affecting pregnancy  3. Previous cesarean delivery, antepartum TOLAC signed  4. GBS (group B Streptococcus carrier), +RV culture, currently pregnant PCN in labor  Term labor symptoms and general obstetric precautions including but not limited to vaginal bleeding, contractions, leaking of fluid and fetal movement were reviewed in detail with the patient. Please refer to After Visit Summary for other counseling recommendations.  Return in about 1 week (around 03/20/2016) for Routine prenatal care.   Federico Flake, MD

## 2016-03-13 NOTE — Progress Notes (Signed)
Reviewed tip of week with patient  

## 2016-03-13 NOTE — Patient Instructions (Signed)
Come to the MAU (maternity admission unit) for 1) Strong contractions every 2-3 minutes for at least 1 hour that do not go away when you drink water or take a warm shower. These contractions will be so strong all you can do is breath through them 2) Vaginal bleeding- anything more than spotting 3) Loss of fluid like you broke your water 4) Decreased movement of your baby  

## 2016-03-14 ENCOUNTER — Encounter (HOSPITAL_COMMUNITY): Payer: Self-pay | Admitting: *Deleted

## 2016-03-14 ENCOUNTER — Inpatient Hospital Stay (HOSPITAL_COMMUNITY)
Admission: AD | Admit: 2016-03-14 | Discharge: 2016-03-14 | Disposition: A | Discharge status: Home / Self Care | Payer: Medicaid Other | Source: Ambulatory Visit | Attending: Obstetrics & Gynecology | Admitting: Obstetrics & Gynecology

## 2016-03-14 DIAGNOSIS — O26893 Other specified pregnancy related conditions, third trimester: Secondary | ICD-10-CM | POA: Diagnosis not present

## 2016-03-14 DIAGNOSIS — O34219 Maternal care for unspecified type scar from previous cesarean delivery: Secondary | ICD-10-CM

## 2016-03-14 DIAGNOSIS — Z3A39 39 weeks gestation of pregnancy: Secondary | ICD-10-CM | POA: Insufficient documentation

## 2016-03-14 DIAGNOSIS — N898 Other specified noninflammatory disorders of vagina: Secondary | ICD-10-CM | POA: Insufficient documentation

## 2016-03-14 DIAGNOSIS — O26879 Cervical shortening, unspecified trimester: Secondary | ICD-10-CM

## 2016-03-14 DIAGNOSIS — Z3493 Encounter for supervision of normal pregnancy, unspecified, third trimester: Secondary | ICD-10-CM

## 2016-03-14 DIAGNOSIS — O99820 Streptococcus B carrier state complicating pregnancy: Secondary | ICD-10-CM

## 2016-03-14 LAB — WET PREP, GENITAL
Clue Cells Wet Prep HPF POC: NONE SEEN
Sperm: NONE SEEN
Trich, Wet Prep: NONE SEEN

## 2016-03-14 LAB — OB RESULTS CONSOLE GC/CHLAMYDIA: Gonorrhea: NEGATIVE

## 2016-03-14 LAB — POCT FERN TEST: POCT FERN TEST: NEGATIVE

## 2016-03-14 NOTE — Discharge Instructions (Signed)
Come to the MAU (maternity admission unit) for °1) Strong contractions every 2-3 minutes for at least 1 hour that do no go away when you drink water or take a warm shower. These contractions will be so strong all you can do is breath through them °2) Vaginal bleeding- anything more than spotting °3) Loss of fluid like you broke your water °4) Decreased movement of your baby ° °

## 2016-03-14 NOTE — MAU Note (Signed)
Pt had increased vaginal discharge yesterday, today it is more watery, brown.  Also increased pelvic pressure.  Denies bleeding, having irregular contractions.

## 2016-03-14 NOTE — MAU Provider Note (Signed)
History     CSN: [id number]  Arrival date and time: 03/14/16 4098   First Provider Initiated Contact with Patient 03/14/16 [phone]      Chief Complaint  Patient presents with  . Vaginal Discharge  . pelvic pressure    HPI Katherine Lindsey is a 30 yo G4P2012 at [redacted]w[redacted]d who presents with complaints of mucousy brown tinged vaginal discharge since this morning and pelvic pressure for the last couple weeks. Patient states that the discharge has no odor and no bright red blood was seen. The pelvic pressure is more pronounced with ambulation, no alleviating factors identified. Denies feeling any contractions and no sexual intercourse over the last 48 hours. + FM. Of note, yesterday in clinic she had her membranes swept.    OB History    Gravida Para Term Preterm AB TAB SAB Ectopic Multiple Living   0   2      Obstetric Comments   2014 LTCS with extension of hysterotomy inferiorly      Past Medical History  Diagnosis Date  . Gallstones   . Crohn's disease in remission [facility])     Hasn't had symptoms since 2004    Past Surgical History  Procedure Laterality Date  . Induced abortion  02/2012  . Cholecystectomy  08/28/2012    Procedure: LAPAROSCOPIC CHOLECYSTECTOMY WITH INTRAOPERATIVE CHOLANGIOGRAM;  Surgeon: [name], DO;  Location: [facility] OR;  Service: General;  Laterality: N/A;  . Cesarean section N/A 06/16/2013    Procedure: CESAREAN SECTION;  Surgeon: Reva Bores, MD;  Location: [facility] ORS;  Service: Obstetrics;  Laterality: N/A;    Family History  Problem Relation Age of Onset  . Other Neg Hx   . Diabetes Neg Hx   . Heart disease Neg Hx   . Hyperlipidemia Neg Hx   . Hypertension Neg Hx   . Stroke Neg Hx     Social History  Substance Use Topics  . Smoking status: Never Smoker   . Smokeless tobacco: Never Used  . Alcohol Use: No    Allergies: No Known Allergies  Prescriptions prior to admission  Medication Sig Dispense Refill Last Dose  . Prenatal Vit-Fe  Fumarate-FA (PRENATAL MULTIVITAMIN) TABS tablet Take 1 tablet by mouth daily at 12 noon. 90 tablet 3 Past Month at Unknown time    Review of Systems  Constitutional: Negative for fever.  Eyes: Negative for blurred vision.  Respiratory: Negative for shortness of breath.   Cardiovascular: Negative for chest pain and leg swelling.  Gastrointestinal: Negative for heartburn, nausea, vomiting, abdominal pain, diarrhea and constipation.  Genitourinary: Negative for dysuria.  Skin: Negative for rash.  Neurological: Negative for dizziness and headaches.  Psychiatric/Behavioral: The patient is nervous/anxious.    Physical Exam   Blood pressure 120/68, pulse 75, temperature 98.4 F (36.9 C), temperature source Oral, resp. rate 18, last menstrual period 06/13/2015.  Physical Exam  Constitutional: She is oriented to person, place, and time. She appears well-developed and well-nourished.  Neck: Normal range of motion. Neck supple.  Cardiovascular: Normal rate, regular rhythm, normal heart sounds and intact distal pulses.   Respiratory: Effort normal and breath sounds normal.  GI: Soft. Bowel sounds are normal.  Musculoskeletal: Normal range of motion. She exhibits no edema.  Neurological: She is alert and oriented to person, place, and time.  Skin: Skin is warm and dry.  Dilation: 4 Effacement (%): 50 Station: 0 Presentation: Vertex Exam by:: Dr. Jonathon Jordan GYN:  External genitalia within normal  limits.  Vaginal mucosa pink, moist, normal rugae. Yellow tinged mucous present on speculum exam, no pooling of fluid of blood seen.   MAU Course  Procedures  MDM Katherine Lindsey presented to the MAU with complaints of pelvic pressure x couple weeks and mucousy-brown discharge x 1 day. FHT reactive. Contractions irregular. Ferning negative, no pooling on speculum exam. UA collected and was WNL. Wet prep was performed and revealed positive yeast. Patient does not endorse any vaginal irritation or  itching, no treatment was given. GC/Chlam labs drawn.   Assessment and Plan  Katherine Lindsey is a 30 yo G4P2012 at [redacted]w[redacted]d who presents with complaints of mucousy brown tinged vaginal discharge x1 day and pelvic pressure x couple weeks.   Vaginal Discharge: Likely due to membranes being swept yesterday. No placenta previa; previous ultrasounds normal, FHT reactive, and no vaginal bleeding on speculum exam. Unlikely ROM as ferning is negative and no pooling of fluid on speculum exam.  - Return precautions given - Follow up GC/Chlam testing  Pelvic Pressure: Likely due to low fetal station. Benign abdominal exam. UA WNL, no signs of infection. - Patient will try intermittent walking and resting.    Beaulah Dinning 03/14/2016, 11:02 AM   I have seen this patient and agree with the above resident's note. Cervix unchanged x 1 hour in MAU and bleeding minimal. Reviewed labor precautions/reasons to return to MAU with pt.    LEFTWICH-KIRBY, Jarel Cuadra Certified Nurse-Midwife

## 2016-03-15 LAB — GC/CHLAMYDIA PROBE AMP (~~LOC~~) NOT AT ARMC
CHLAMYDIA, DNA PROBE: NEGATIVE
NEISSERIA GONORRHEA: NEGATIVE

## 2016-03-16 ENCOUNTER — Telehealth: Payer: Self-pay | Admitting: General Practice

## 2016-03-16 ENCOUNTER — Inpatient Hospital Stay (HOSPITAL_COMMUNITY)
Admission: AD | Admit: 2016-03-16 | Discharge: 2016-03-19 | DRG: 775 | Disposition: A | Discharge status: Home / Self Care | Payer: Medicaid Other | Source: Ambulatory Visit | Attending: Obstetrics and Gynecology | Admitting: Obstetrics and Gynecology

## 2016-03-16 DIAGNOSIS — O26879 Cervical shortening, unspecified trimester: Secondary | ICD-10-CM

## 2016-03-16 DIAGNOSIS — O34219 Maternal care for unspecified type scar from previous cesarean delivery: Secondary | ICD-10-CM

## 2016-03-16 DIAGNOSIS — Z3A39 39 weeks gestation of pregnancy: Secondary | ICD-10-CM | POA: Diagnosis not present

## 2016-03-16 DIAGNOSIS — O34211 Maternal care for low transverse scar from previous cesarean delivery: Principal | ICD-10-CM | POA: Diagnosis present

## 2016-03-16 DIAGNOSIS — O99820 Streptococcus B carrier state complicating pregnancy: Secondary | ICD-10-CM

## 2016-03-16 DIAGNOSIS — Z3493 Encounter for supervision of normal pregnancy, unspecified, third trimester: Secondary | ICD-10-CM

## 2016-03-16 DIAGNOSIS — O99824 Streptococcus B carrier state complicating childbirth: Secondary | ICD-10-CM | POA: Diagnosis present

## 2016-03-16 DIAGNOSIS — IMO0001 Reserved for inherently not codable concepts without codable children: Secondary | ICD-10-CM

## 2016-03-16 MED ORDER — SODIUM CHLORIDE 0.9 % IV SOLN
2.0000 g | Freq: Once | INTRAVENOUS | Status: AC
  Administered 2016-03-17: 2 g via INTRAVENOUS
  Filled 2016-03-16: qty 2000

## 2016-03-16 MED ORDER — ACETAMINOPHEN 325 MG PO TABS: 650.0000 mg | ORAL_TABLET | ORAL | Status: DC | PRN

## 2016-03-16 MED ORDER — LIDOCAINE HCL (PF) 1 % IJ SOLN
30.0000 mL | INTRAMUSCULAR | Status: DC | PRN
  Filled 2016-03-16: qty 30

## 2016-03-16 MED ORDER — ONDANSETRON HCL 4 MG/2ML IJ SOLN: 4.0000 mg | Freq: Four times a day (QID) | INTRAMUSCULAR | Status: DC | PRN

## 2016-03-16 MED ORDER — LACTATED RINGERS IV SOLN
500.0000 mL | INTRAVENOUS | Status: DC | PRN
  Administered 2016-03-16: 1000 mL via INTRAVENOUS

## 2016-03-16 MED ORDER — OXYCODONE-ACETAMINOPHEN 5-325 MG PO TABS: 1.0000 | ORAL_TABLET | ORAL | Status: DC | PRN

## 2016-03-16 MED ORDER — LACTATED RINGERS IV SOLN: INTRAVENOUS | Status: DC

## 2016-03-16 MED ORDER — OXYCODONE-ACETAMINOPHEN 5-325 MG PO TABS: 2.0000 | ORAL_TABLET | ORAL | Status: DC | PRN

## 2016-03-16 MED ORDER — CITRIC ACID-SODIUM CITRATE 334-500 MG/5ML PO SOLN: 30.0000 mL | ORAL | Status: DC | PRN

## 2016-03-16 MED ORDER — OXYTOCIN 10 UNIT/ML IJ SOLN
2.5000 [IU]/h | INTRAVENOUS | Status: DC
  Administered 2016-03-17: 2.5 [IU]/h via INTRAVENOUS
  Filled 2016-03-16: qty 4

## 2016-03-16 MED ORDER — FENTANYL CITRATE (PF) 100 MCG/2ML IJ SOLN: 100.0000 ug | INTRAMUSCULAR | Status: DC | PRN

## 2016-03-16 MED ORDER — OXYTOCIN BOLUS FROM INFUSION
500.0000 mL | INTRAVENOUS | Status: DC
  Administered 2016-03-17: 500 mL via INTRAVENOUS

## 2016-03-16 NOTE — H&P (Signed)
Katherine Lindsey is a 30 y.o. female [phone] @ 39.4wks by LMP and confirmed by 6wk scan presenting for eval of labor. Denies leaking or bldg; no H/A, N/V or visual disturbances. Her preg has been followed by the [facility] and has been remarkable for 1) 1st vag del, then LTCS for FTD/failed vac- desires TOLAC 2) GBS pos 3) shortened cx earlier in preg  History OB History    Gravida Para Term Preterm AB TAB SAB Ectopic Multiple Living   4 2 2  1 1  0   2      Obstetric Comments   2014 LTCS with extension of hysterotomy inferiorly     Past Medical History  Diagnosis Date  . Gallstones   . Crohn's disease in remission [facility])     Hasn't had symptoms since 2004   Past Surgical History  Procedure Laterality Date  . Induced abortion  02/2012  . Cholecystectomy  08/28/2012    Procedure: LAPAROSCOPIC CHOLECYSTECTOMY WITH INTRAOPERATIVE CHOLANGIOGRAM;  Surgeon: [name], DO;  Location: [facility] OR;  Service: General;  Laterality: N/A;  . Cesarean section N/A 06/16/2013    Procedure: CESAREAN SECTION;  Surgeon: Reva Bores, MD;  Location: [facility] ORS;  Service: Obstetrics;  Laterality: N/A;   Family History: family history is negative for Other, Diabetes, Heart disease, Hyperlipidemia, Hypertension, and Stroke. Social History:  reports that she has never smoked. She has never used smokeless tobacco. She reports that she does not drink alcohol or use illicit drugs.   Prenatal Transfer Tool  Maternal Diabetes: No Genetic Screening: Normal Maternal Ultrasounds/Referrals: Normal Fetal Ultrasounds or other Referrals:  None Maternal Substance Abuse:  No Significant Maternal Medications:  None Significant Maternal Lab Results:  Lab values include: Group B Strep positive Other Comments:  None  ROS  Dilation: 6 Effacement (%): 90 Station: -1, 0 Exam by:: asnah-mensah, rnc  Blood pressure 123/62, pulse 81, temperature 97.7 F (36.5 C), resp. rate 18, height 5\' 8"  (1.727 m), weight 83.915 kg (185 lb), last  menstrual period 06/13/2015. Exam Physical Exam  Constitutional: She is oriented to person, place, and time. She appears well-developed.  HENT:  Head: Normocephalic.  Neck: Normal range of motion.  Cardiovascular: Normal rate.   Respiratory: Effort normal.  GI:  EFM 130s, avg LTV, has not been on monitor long enough for NST  Ctx q 2-3 mins  Musculoskeletal: Normal range of motion.  Neurological: She is alert and oriented to person, place, and time.  Skin: Skin is warm and dry.  Psychiatric: She has a normal mood and affect. Her behavior is normal. Thought content normal.    Prenatal labs: ABO, Rh: A/POS/-- (08/30 1033) Antibody: NEG (08/30 1033) Rubella: 6.29 (08/30 1033) RPR: NON REAC (01/26 0919)  HBsAg: NEGATIVE (08/30 1033)  HIV: NONREACTIVE (01/26 0919)  GBS: Positive (03/01 0000)   Assessment/Plan: IUP@39 .4wks Active labor TOLAC GBS pos  Admit to [facility] Expectant management Amp for GBS ppx Anticipate SVD   SHAW, KIMBERLY CNM 03/16/2016, 11:44 PM

## 2016-03-16 NOTE — MAU Note (Signed)
Contractions for several days. More intense today. Denies LOF or bleeding. 4cm last sve

## 2016-03-16 NOTE — Telephone Encounter (Signed)
Patient called into front office stating she is having a significant amount of pressure and is already dilated 4.5 cm. Patient states she just doesn't feel like she can go to work anymore and her work told her to contact us. Told patient we can only take her out of work once she has the baby but if she has an agreement with her job such as 12 weeks of fmla then it is up to her and her employer to decide when she starts that. Patient verbalized understanding & had no questions

## 2016-03-17 ENCOUNTER — Inpatient Hospital Stay (HOSPITAL_COMMUNITY): Payer: Medicaid Other | Admitting: Anesthesiology

## 2016-03-17 ENCOUNTER — Encounter (HOSPITAL_COMMUNITY): Payer: Self-pay | Admitting: General Practice

## 2016-03-17 DIAGNOSIS — Z3A39 39 weeks gestation of pregnancy: Secondary | ICD-10-CM

## 2016-03-17 DIAGNOSIS — O99824 Streptococcus B carrier state complicating childbirth: Secondary | ICD-10-CM

## 2016-03-17 LAB — CBC
HCT: 36 % (ref 36.0–46.0)
Hemoglobin: 12.5 g/dL (ref 12.0–15.0)
MCH: 28.9 pg (ref 26.0–34.0)
MCHC: 34.7 g/dL (ref 30.0–36.0)
MCV: 83.3 fL (ref 78.0–100.0)
PLATELETS: 258 10*3/uL (ref 150–400)
RBC: 4.32 MIL/uL (ref 3.87–5.11)
RDW: 13.7 % (ref 11.5–15.5)
WBC: 10.9 10*3/uL — ABNORMAL HIGH (ref 4.0–10.5)

## 2016-03-17 LAB — TYPE AND SCREEN
ABO/RH(D): A POS
ANTIBODY SCREEN: NEGATIVE

## 2016-03-17 LAB — RPR: RPR: NONREACTIVE

## 2016-03-17 MED ORDER — SIMETHICONE 80 MG PO CHEW: 80.0000 mg | CHEWABLE_TABLET | ORAL | Status: DC | PRN

## 2016-03-17 MED ORDER — WITCH HAZEL-GLYCERIN EX PADS: 1.0000 "application " | MEDICATED_PAD | CUTANEOUS | Status: DC | PRN

## 2016-03-17 MED ORDER — PHENYLEPHRINE 40 MCG/ML (10ML) SYRINGE FOR IV PUSH (FOR BLOOD PRESSURE SUPPORT): 80.0000 ug | PREFILLED_SYRINGE | INTRAVENOUS | Status: DC | PRN

## 2016-03-17 MED ORDER — TETANUS-DIPHTH-ACELL PERTUSSIS 5-2.5-18.5 LF-MCG/0.5 IM SUSP: 0.5000 mL | Freq: Once | INTRAMUSCULAR | Status: DC

## 2016-03-17 MED ORDER — ACETAMINOPHEN 325 MG PO TABS: 650.0000 mg | ORAL_TABLET | ORAL | Status: DC | PRN

## 2016-03-17 MED ORDER — FENTANYL 2.5 MCG/ML BUPIVACAINE 1/10 % EPIDURAL INFUSION (WH - ANES)
14.0000 mL/h | INTRAMUSCULAR | Status: DC | PRN
  Administered 2016-03-17 (×2): 14 mL/h via EPIDURAL
  Filled 2016-03-17: qty 125

## 2016-03-17 MED ORDER — LANOLIN HYDROUS EX OINT: TOPICAL_OINTMENT | CUTANEOUS | Status: DC | PRN

## 2016-03-17 MED ORDER — EPHEDRINE 5 MG/ML INJ: 10.0000 mg | INTRAVENOUS | Status: DC | PRN

## 2016-03-17 MED ORDER — ONDANSETRON HCL 4 MG/2ML IJ SOLN: 4.0000 mg | INTRAMUSCULAR | Status: DC | PRN

## 2016-03-17 MED ORDER — PHENYLEPHRINE 40 MCG/ML (10ML) SYRINGE FOR IV PUSH (FOR BLOOD PRESSURE SUPPORT)
80.0000 ug | PREFILLED_SYRINGE | INTRAVENOUS | Status: DC | PRN
Start: 2016-03-17 — End: 2016-03-17
  Filled 2016-03-17: qty 20

## 2016-03-17 MED ORDER — SENNOSIDES-DOCUSATE SODIUM 8.6-50 MG PO TABS
2.0000 | ORAL_TABLET | ORAL | Status: DC
  Administered 2016-03-17 – 2016-03-19 (×2): 2 via ORAL
  Filled 2016-03-17 (×2): qty 2

## 2016-03-17 MED ORDER — LIDOCAINE HCL (PF) 1 % IJ SOLN
INTRAMUSCULAR | Status: DC | PRN
  Administered 2016-03-17 (×2): 5 mL

## 2016-03-17 MED ORDER — PRENATAL MULTIVITAMIN CH
1.0000 | ORAL_TABLET | Freq: Every day | ORAL | Status: DC
  Administered 2016-03-17 – 2016-03-19 (×3): 1 via ORAL
  Filled 2016-03-17 (×3): qty 1

## 2016-03-17 MED ORDER — BENZOCAINE-MENTHOL 20-0.5 % EX AERO
1.0000 | INHALATION_SPRAY | CUTANEOUS | Status: DC | PRN
Start: 2016-03-17 — End: 2016-03-19
  Administered 2016-03-17: 1 via TOPICAL
  Filled 2016-03-17: qty 56

## 2016-03-17 MED ORDER — DIPHENHYDRAMINE HCL 25 MG PO CAPS
25.0000 mg | ORAL_CAPSULE | Freq: Four times a day (QID) | ORAL | Status: DC | PRN
Start: 2016-03-17 — End: 2016-03-19

## 2016-03-17 MED ORDER — ONDANSETRON HCL 4 MG PO TABS: 4.0000 mg | ORAL_TABLET | ORAL | Status: DC | PRN

## 2016-03-17 MED ORDER — DIBUCAINE 1 % RE OINT: 1.0000 "application " | TOPICAL_OINTMENT | RECTAL | Status: DC | PRN

## 2016-03-17 MED ORDER — LACTATED RINGERS IV SOLN: 500.0000 mL | Freq: Once | INTRAVENOUS | Status: DC

## 2016-03-17 MED ORDER — DIPHENHYDRAMINE HCL 50 MG/ML IJ SOLN: 12.5000 mg | INTRAMUSCULAR | Status: DC | PRN

## 2016-03-17 MED ORDER — ZOLPIDEM TARTRATE 5 MG PO TABS: 5.0000 mg | ORAL_TABLET | Freq: Every evening | ORAL | Status: DC | PRN

## 2016-03-17 MED ORDER — IBUPROFEN 600 MG PO TABS
600.0000 mg | ORAL_TABLET | Freq: Four times a day (QID) | ORAL | Status: DC
  Administered 2016-03-17 – 2016-03-19 (×10): 600 mg via ORAL
  Filled 2016-03-17 (×10): qty 1

## 2016-03-17 NOTE — Anesthesia Preprocedure Evaluation (Signed)

## 2016-03-17 NOTE — Lactation Note (Signed)
This note was copied from a baby's chart. Lactation Consultation Note  Patient Name: Katherine Lindsey Today's Date: 03/17/2016 Reason for consult:  (wrong time) Mom called for assist with latch. LC assisted Mom with latching to left breast in football hold, baby obtained good depth but Mom's discomfort did not improve with baby nursing and small amount of bleeding noted when baby came off the breast. Nipple creased across the top (lipstick appearance). With oral exam of baby, short posterior lingual frenulum noted with short labial frenulum. Tried #20/24 nipple shield to see if this would improve discomfort but the nipple shield did not help. Tried the nipple shield on the right breast. At the beginning of the feeding, Mom reported less discomfort but as the baby continued to nurse Mom reported discomfort and wanted to take baby off the breast. Mom reports she does want to provide breast milk for her baby, set up DEBP for Mom to use, reviewed use and cleaning. Breast milk storage guidelines discussed but advised if she gets any milk with pumping give this back to baby. Advised Mom since baby not going to breast to supplement each feeding with 10-20 ml of EBM or formula every 3 hours today/tonight. Will increase each day. Advised to pump every 3 hours for 15 minutes. Slight discomfort with pumping but Mom tolerating at this visit, Flange appears to fit well. Refer to Baby N Me booklet page 25 for storage guidelines. Care for sore nipples reviewed. Comfort gels given with instructions. Call for assist as needed.   Maternal Data Has patient been taught Hand Expression?: Yes Does the patient have breastfeeding experience prior to this delivery?: Yes  Feeding Feeding Type: Bottle Fed - Formula Length of feed: 20 min  LATCH Score/Interventions Latch:  (using nipple shield on right breast. ) Intervention(s): Adjust position;Assist with latch  Audible Swallowing: None  Type of Nipple:  (short  nipple shafts bilateral)  Comfort (Breast/Nipple): Filling, red/small blisters or bruises, mild/mod discomfort  Problem noted: Mild/Moderate discomfort Interventions  (Cracked/bleeding/bruising/blister): Expressed breast milk to nipple;Other (comment) Interventions (Mild/moderate discomfort): Comfort gels  Hold (Positioning): Assistance needed to correctly position infant at breast and maintain latch.  LATCH Score: 6  Lactation Tools Discussed/Used Tools: Nipple Shields;Pump;Comfort gels Nipple shield size: 20;24 Breast pump type: Double-Electric Breast Pump   Consult Status Consult Status: Follow-up Date: 03/18/16 Follow-up type: In-patient    Alfred Levins 03/17/2016, 6:05 PM

## 2016-03-17 NOTE — Anesthesia Postprocedure Evaluation (Signed)
Anesthesia Post Note  Patient: Katherine Lindsey  Procedure(s) Performed: * No procedures listed *  Patient location during evaluation: Mother Baby Anesthesia Type: Epidural Level of consciousness: awake and alert and oriented Pain management: satisfactory to patient Vital Signs Assessment: post-procedure vital signs reviewed and stable Respiratory status: spontaneous breathing and nonlabored ventilation Cardiovascular status: stable Postop Assessment: no headache, no backache, no signs of nausea or vomiting, adequate PO intake and patient able to bend at knees (patient up walking) Anesthetic complications: no    Last Vitals:  Filed Vitals:   03/17/16 0445 03/17/16 0539  BP: 115/55 113/59  Pulse: 69 67  Temp: 36.7 C 36.7 C  Resp: 18 18    Last Pain:  Filed Vitals:   03/17/16 0540  PainSc: 0-No pain                 Matson Welch

## 2016-03-17 NOTE — Anesthesia Procedure Notes (Signed)
Epidural Patient location during procedure: OB  Staffing Anesthesiologist: Obe Ahlers Performed by: anesthesiologist   Preanesthetic Checklist Completed: patient identified, site marked, surgical consent, pre-op evaluation, timeout performed, IV checked, risks and benefits discussed and monitors and equipment checked  Epidural Patient position: sitting Prep: DuraPrep Patient monitoring: heart rate, continuous pulse ox and blood pressure Approach: right paramedian Location: L3-L4 Injection technique: LOR saline  Needle:  Needle type: Tuohy  Needle gauge: 17 G Needle length: 9 cm and 9 Needle insertion depth: 4 cm Catheter type: closed end flexible Catheter size: 20 Guage Catheter at skin depth: 8 cm Test dose: negative  Assessment Events: blood not aspirated, injection not painful, no injection resistance, negative IV test and no paresthesia  Additional Notes Patient identified. Risks/Benefits/Options discussed with patient including but not limited to bleeding, infection, nerve damage, paralysis, failed block, incomplete pain control, headache, blood pressure changes, nausea, vomiting, reactions to medication both or allergic, itching and postpartum back pain. Confirmed with bedside nurse the patient's most recent platelet count. Confirmed with patient that they are not currently taking any anticoagulation, have any bleeding history or any family history of bleeding disorders. Patient expressed understanding and wished to proceed. All questions were answered. Sterile technique was used throughout the entire procedure. Please see nursing notes for vital signs. Test dose was given through epidural needle and negative prior to continuing to dose epidural or start infusion. Warning signs of high block given to the patient including shortness of breath, tingling/numbness in hands, complete motor block, or any concerning symptoms with instructions to call for help. Patient was given  instructions on fall risk and not to get out of bed. All questions and concerns addressed with instructions to call with any issues.   

## 2016-03-17 NOTE — Lactation Note (Signed)
This note was copied from a baby's chart. Lactation Consultation Note  Patient Name: Katherine Lindsey [id number] Date: 03/17/2016 Reason for consult: Initial assessment Mom reporting nipple tenderness with BF, no breakdown reported. Mom giving lots of bottles reporting sensitive breasts. Baby asleep at this visit so LC advised Mom to call with next feeding for assist. Mom agreed. Basic teaching discussed. Lactation brochure left for review, advised of OP services and support group. Mom to call.   Maternal Data Has patient been taught Hand Expression?: Yes Does the patient have breastfeeding experience prior to this delivery?: Yes  Feeding    LATCH Score/Interventions                      Lactation Tools Discussed/Used     Consult Status Consult Status: Follow-up Date: 03/18/16 Follow-up type: In-patient    Alfred Levins 03/17/2016, 2:35 PM

## 2016-03-18 NOTE — Progress Notes (Signed)
Post Partum Day 1 Subjective: no complaints, up ad lib, voiding and tolerating PO  Objective: Blood pressure 109/61, pulse 64, temperature 98 F (36.7 C), temperature source Oral, resp. rate 16, height  (1.727 m), weight 185 lb (83.915 kg), last menstrual period 06/13/2015, SpO2 100 %, unknown if currently breastfeeding.  Physical Exam:  General: alert, cooperative and no distress Lochia: appropriate Uterine Fundus: firm Incision:  DVT Evaluation: No evidence of DVT seen on physical exam.   Recent Labs  03/16/16 2350  HGB 12.5  HCT 36.0    Assessment/Plan: Plan for discharge tomorrow and Breastfeeding   LOS: 2 days   Llana Deshazo H 03/18/2016, 7:36 AM

## 2016-03-18 NOTE — Lactation Note (Signed)
This note was copied from a baby's chart. Lactation Consultation Note  Patient Name: Katherine Lindsey [id number] Date: 03/18/2016 Reason for consult: Follow-up assessment;Breast/nipple pain;Difficult latch   Follow up with mom of 30 hour old infant. Infant with 2 BF for 10-20 minutes, 3 attempts for 5 min, 5 bottles of formula of 10-25 cc, 5 voids and 1 stool in last 24 hours. Mom is using NS when infant is feeding at the breast. Infant weight 6 lb 6.1 oz with 3 % weight loss since birth.   Mom reports her nipples are very sore, she is using EBM and comfort gels. She reports she needs to give her nipples a break. She reports she is pumping every 3 hours and not getting any Colostrum at this time. Enc her to continue pumping every 2-3 hours and to reintroduce the breast as she is able. Enc mom to call for assistance as needed.   Mom without questions at this time, to call PRN.   Maternal Data    Feeding    LATCH Score/Interventions                      Lactation Tools Discussed/Used Pump Review: Setup, frequency, and cleaning   Consult Status Consult Status: Follow-up Date: 03/19/16 Follow-up type: In-patient    Silas Flood Hice 03/18/2016, 9:45 AM

## 2016-03-18 NOTE — Plan of Care (Signed)
Problem: Nutritional: Goal: Mother's verbalization of comfort with breastfeeding process will improve Outcome: Not Applicable Date Met:  03/18/16 Patient has decided not to breastfeed.  Problem: Pain Management: Goal: General experience of comfort will improve and pain level will decrease Outcome: Completed/Met Date Met:  03/18/16 Good pain control on po Motrin.     

## 2016-03-19 DIAGNOSIS — O34219 Maternal care for unspecified type scar from previous cesarean delivery: Secondary | ICD-10-CM

## 2016-03-19 MED ORDER — IBUPROFEN 600 MG PO TABS: 600.0000 mg | ORAL_TABLET | Freq: Four times a day (QID) | ORAL | Status: DC

## 2016-03-19 MED ORDER — MEDROXYPROGESTERONE ACETATE 150 MG/ML IM SUSP
150.0000 mg | Freq: Once | INTRAMUSCULAR | Status: AC
  Administered 2016-03-19: 150 mg via INTRAMUSCULAR
  Filled 2016-03-19: qty 1

## 2016-03-19 MED ORDER — ACETAMINOPHEN 325 MG PO TABS: 650.0000 mg | ORAL_TABLET | ORAL | Status: DC | PRN

## 2016-03-19 NOTE — Discharge Instructions (Signed)

## 2016-03-19 NOTE — Lactation Note (Signed)
This note was copied from a baby's chart. Lactation Consultation Note  Patient Name: Katherine Lindsey [id number] Date: 03/19/2016 Reason for consult: Follow-up assessment  NICU baby 61 hours old. Mom reports that it hurts when baby at breast and she has decided that she no longer wants to nurse the baby at breast. Mom states that she might pump and bottle-feed EBM some, but has only pumped a couple of times in over 24 hours. Discussed normal progression of milk coming to volume and supply and demand. Offered mom a DEBP and she declined. Demonstrated how to use piston in her kit to manually double-pump her breasts. Mom aware of OP/BFSG and [place] phone line assistance after D/C.  Maternal Data    Feeding Feeding Type: Breast Fed Nipple Type: Slow - flow  LATCH Score/Interventions                      Lactation Tools Discussed/Used     Consult Status Consult Status: PRN    Inocente Salles 03/19/2016, 9:42 AM

## 2016-03-19 NOTE — Progress Notes (Signed)
Pt verbazlies understanding of d/c instructions, medications, follow up appts, when to seek medical attention and belongings policy. Pt has no questions at this time. No IV at time of d/c. Pt was encouraged to check the room thoroughly for belongings prior to leaving. Pt was escorted out by Santiago Bur, Raquel Sarna

## 2016-03-19 NOTE — Discharge Summary (Signed)
OB Discharge Summary     Patient Name: Katherine Lindsey DOB: Jul 16, 1986 MRN: [id number]  Date of admission: 03/16/2016 Delivering MD: Cam Hai D   Date of discharge: 03/19/2016  Admitting diagnosis: 39wks, contractions Intrauterine pregnancy: [redacted]w[redacted]d     Secondary diagnosis:  Active Problems:   Active labor at term   VBAC, delivered  Additional problems: none     Discharge diagnosis: Term Pregnancy Delivered                                                                                                Post partum procedures:none  Augmentation: none  Complications: None  Hospital course:  Onset of Labor With Vaginal Delivery     30 y.o. yo [id number] at [redacted]w[redacted]d was admitted in Active Labor on 03/16/2016. Patient had an uncomplicated labor course as follows:  Membrane Rupture Time/Date: 2:05 AM ,03/17/2016   Intrapartum Procedures: Episiotomy: None [1]                                         Lacerations:  None [1]  Patient had a delivery of a Viable infant. 03/17/2016  Information for the patient's newborn:  Dunya, Meiners [[id number]]  Delivery Method: Vag-Spont   GBS pos, inadequately treated 2/2 precipitious delivery. succesful VBAC.  Pateint had an uncomplicated postpartum course.  She is ambulating, tolerating a regular diet, passing flatus, and urinating well. Patient is discharged home in stable condition on 03/19/2016.    Physical exam  Filed Vitals:   03/18/16 1233 03/18/16 1717 03/18/16 2150 03/19/16 0555  BP: 114/63 117/58 109/54 114/59  Pulse: 75 69 71 67  Temp: 98.3 F (36.8 C) 98.2 F (36.8 C) 98 F (36.7 C) 98.3 F (36.8 C)  TempSrc: Oral Oral Oral Oral  Resp: Height:      Weight:      SpO2: 100% 100% 100% 100%   General: alert, cooperative and no distress Lochia: appropriate Uterine Fundus: firm Incision: N/A DVT Evaluation: No cords or calf tenderness. No significant calf/ankle edema. Labs: Lab Results  Component  Value Date   WBC 10.9* 03/16/2016   HGB 12.5 03/16/2016   HCT 36.0 03/16/2016   MCV 83.3 03/16/2016   PLT 258 03/16/2016   CMP Latest Ref Rng 05/18/2015  Glucose 65 - 99 mg/dL 97  BUN 6 - 20 mg/dL 9  Creatinine 8.46 - 9.62 mg/dL 9.52  Sodium 841 - 324 mmol/L 138  Potassium 3.5 - 5.1 mmol/L 3.8  Chloride 101 - 111 mmol/L 103  CO2 22 - 32 mmol/L 24  Calcium 8.9 - 10.3 mg/dL 9.7  Total Protein 6.5 - 8.1 g/dL 8.1  Total Bilirubin 0.3 - 1.2 mg/dL 0.4  Alkaline Phos 38 - 126 U/L 83  AST 15 - 41 U/L 20  ALT 14 - 54 U/L 15    Discharge instruction: per After Visit Summary and "Baby and Me Booklet".  After visit meds:    Medication List  TAKE these medications        acetaminophen 325 MG tablet  Commonly known as:  TYLENOL  Take 2 tablets (650 mg total) by mouth every 4 (four) hours as needed (for pain scale < 4).     ibuprofen 600 MG tablet  Commonly known as:  ADVIL,MOTRIN  Take 1 tablet (600 mg total) by mouth every 6 (six) hours.     prenatal multivitamin Tabs tablet  Take 1 tablet by mouth daily at 12 noon.        Diet: routine diet  Activity: Advance as tolerated. Pelvic rest for 6 weeks.   Outpatient follow up:6 weeks Follow up Appt:No future appointments. Follow up Visit:No Follow-up on file.  Postpartum contraception: Depo Provera  Newborn Data: Live born female  Birth Weight: 6 lb 9.1 oz (2980 g) APGAR: 9, 9  Baby Feeding: Bottle and Breast Disposition:home with mother   03/19/2016 Silvano Bilis, MD

## 2016-03-21 ENCOUNTER — Encounter: Payer: Medicaid Other | Admitting: Certified Nurse Midwife

## 2016-04-23 ENCOUNTER — Ambulatory Visit: Payer: Medicaid Other | Admitting: Medical

## 2016-04-25 IMAGING — US US MFM OB TRANSVAGINAL
1 series · 15 of 21 positions shown · non-contrast
Comparison: none

[Series 1: us mfm ob transvaginal · 21 acquisitions, 15 frames shown]
[im 1/21]
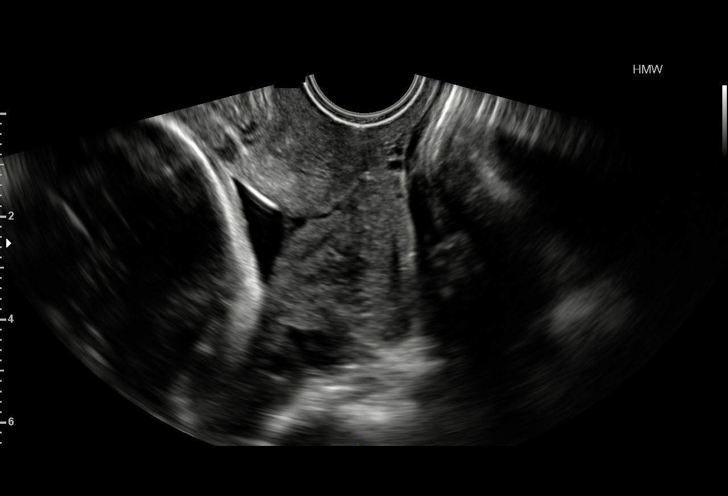
[im 3/21]
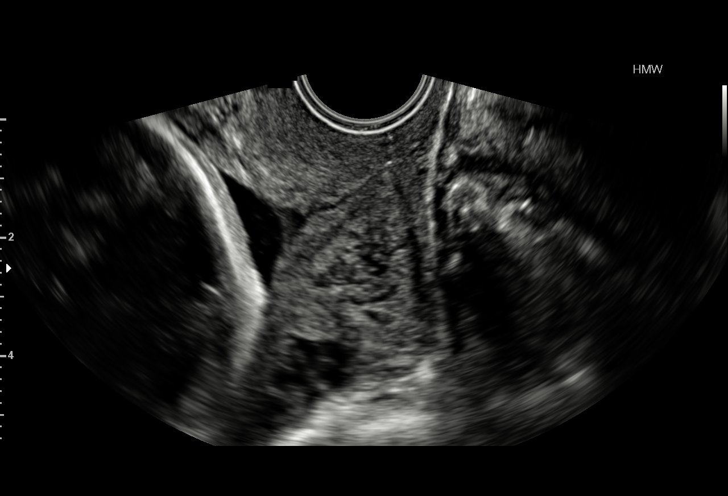
[im 4/21]
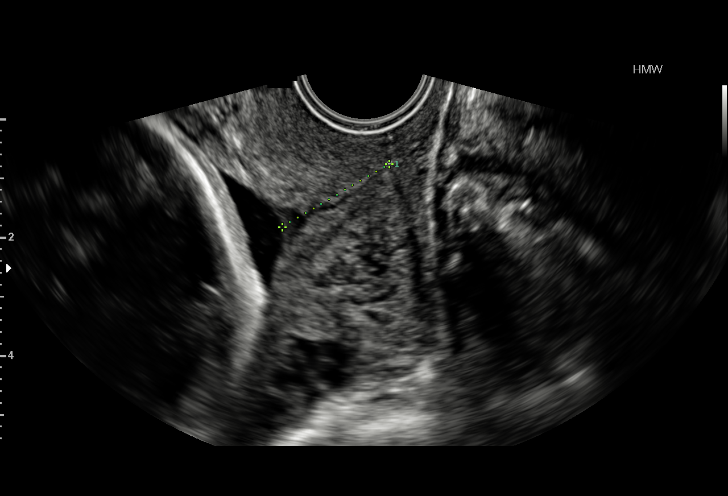
[im 5/21]
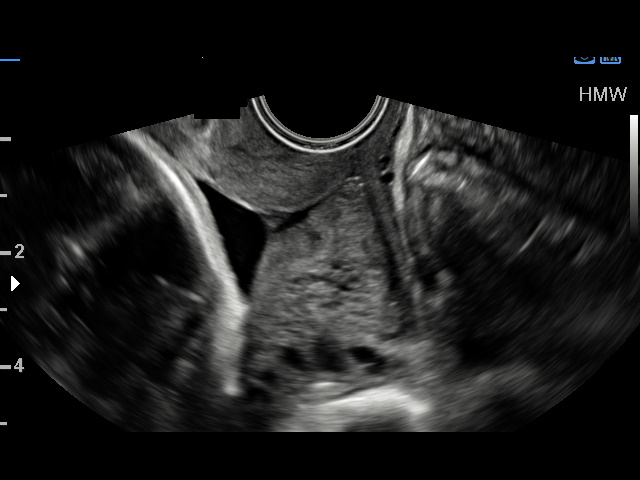
[im 7/21]
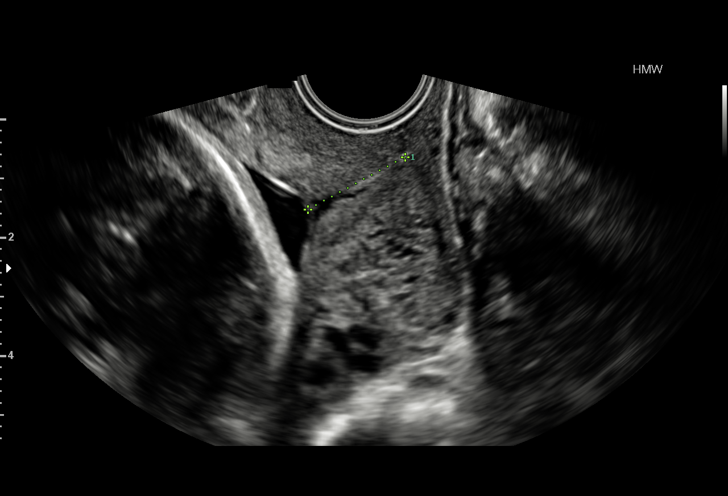
[im 8/21]
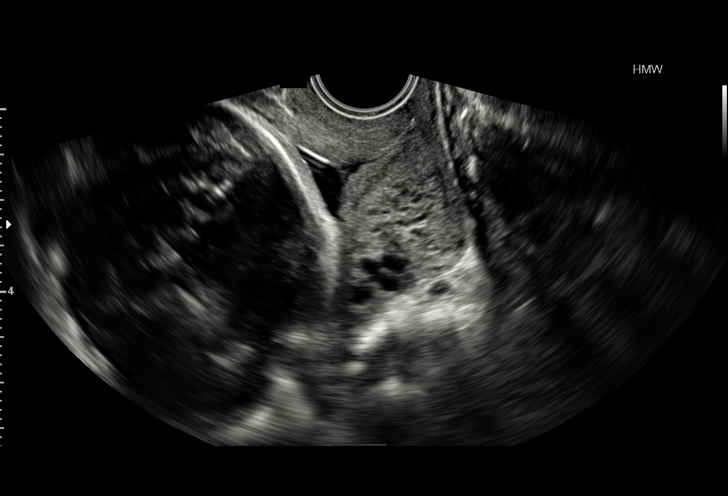
[im 10/21]
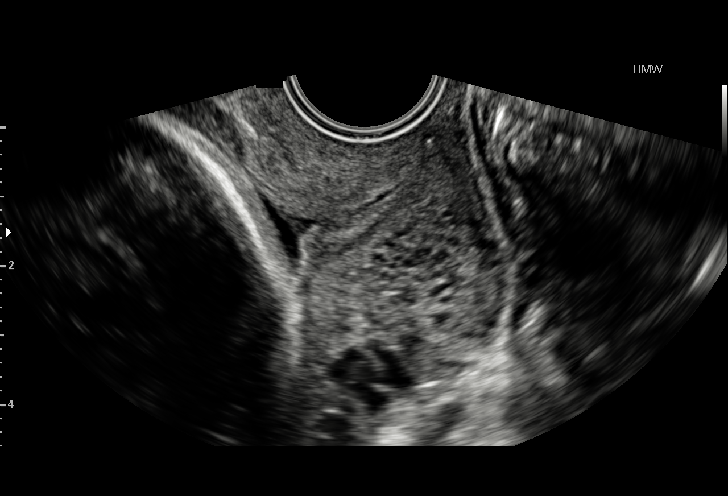
[im 11/21]
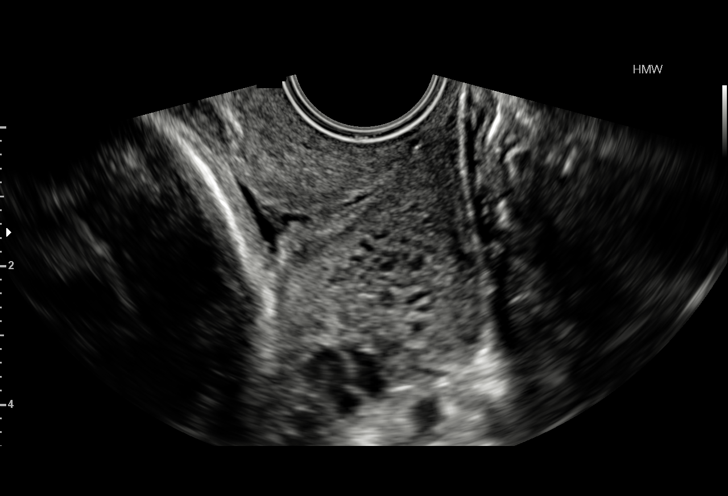
[im 12/21]
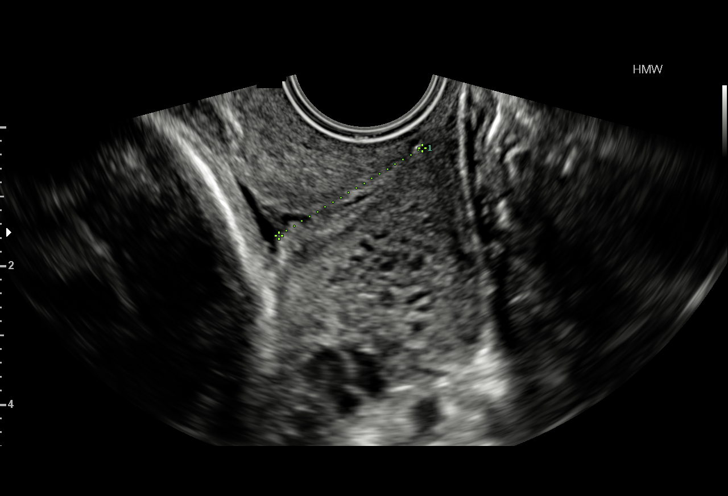
[im 14/21]
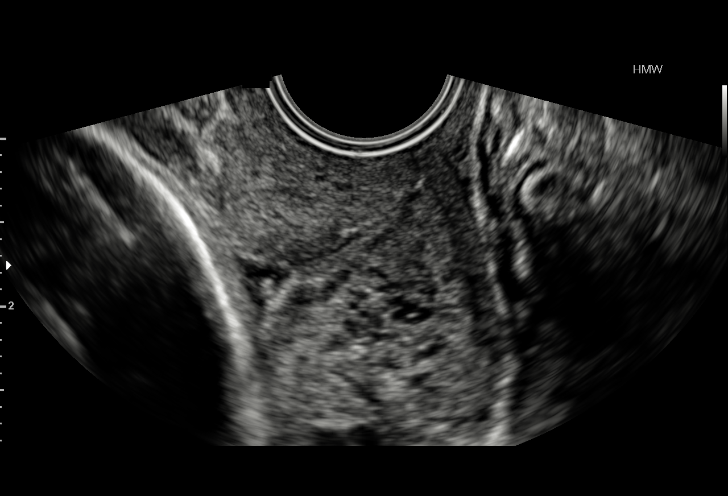
[im 15/21]
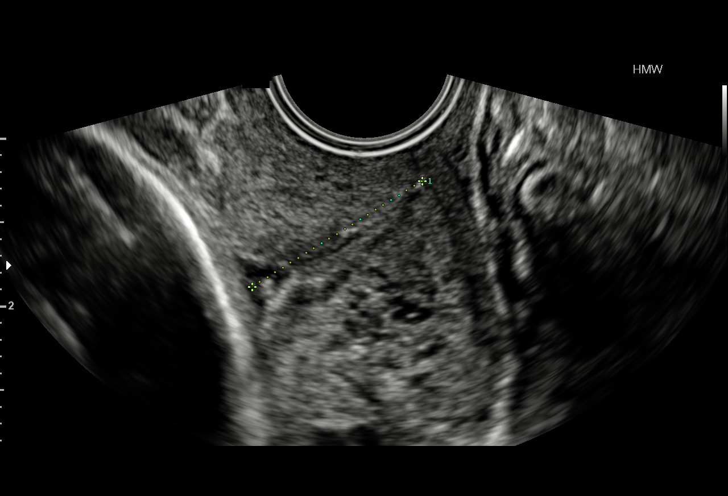
[im 17/21]
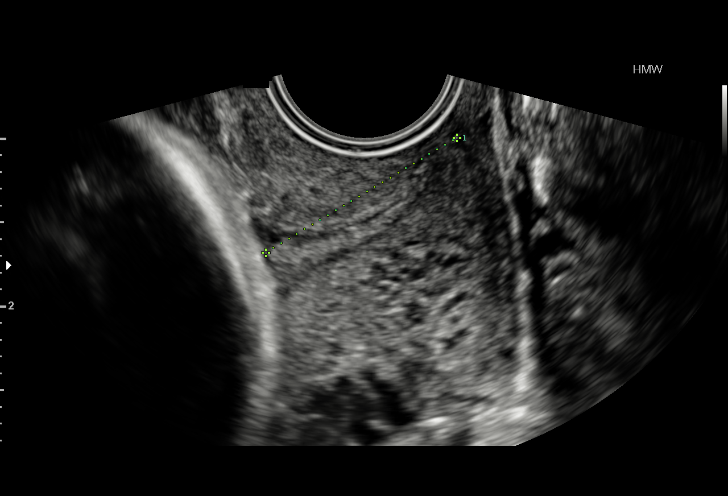
[im 18/21]
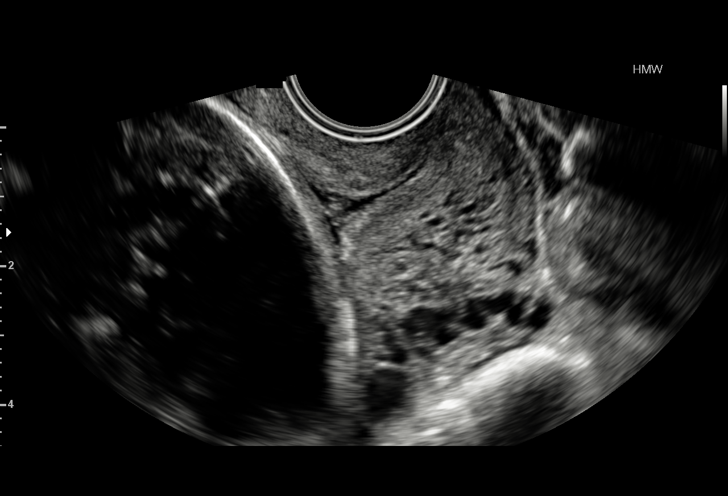
[im 19/21]
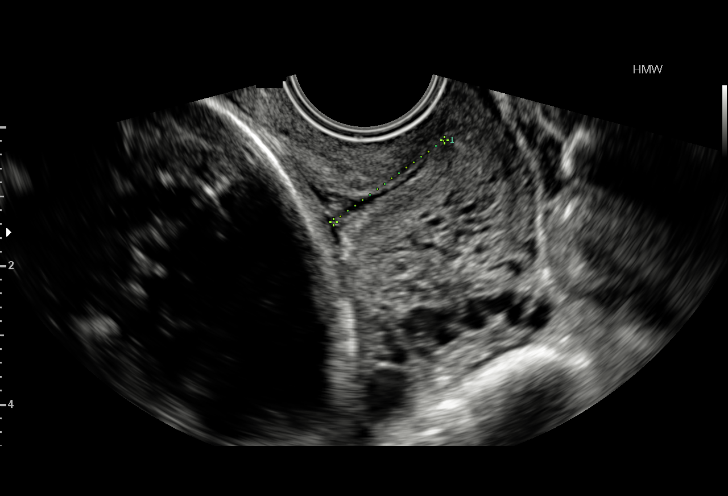
[im 21/21]
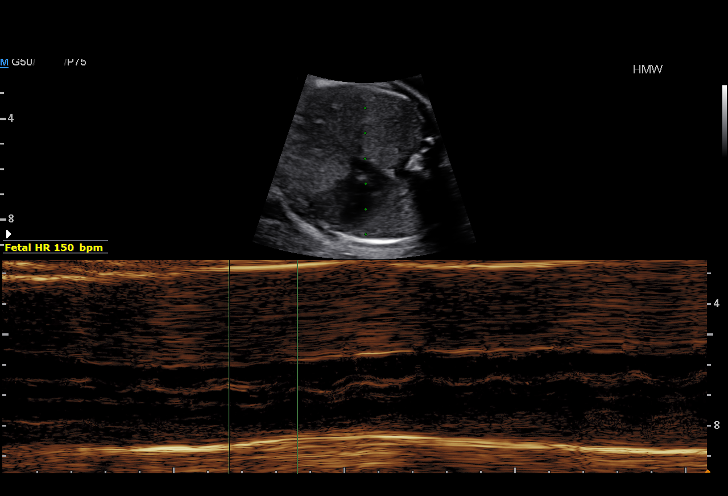

[15 of 21 positions shown; findings below may reference images not displayed]

Hospital Clinic-
Faculty Physician
MAU/Triage

OB/Gyn Clinic

Indications

28 weeks gestation of pregnancy
Cervical shortening complicating pregnancy
(on Caleb); BMZ complete
Previous cesarean delivery, antepartum
Preterm contractions
OB History

Gravidity:    4         Term:   2        Prem:   0         SAB:   0
TOP:          1       Ectopic:  0        Living: 2
Fetal Evaluation

Num Of Fetuses:     1
Fetal Heart         150
Rate(bpm):
Cardiac Activity:   Observed
Presentation:       Cephalic
Gestational Age

LMP:           28w 1d        Date:  06/13/15                 EDD:    03/19/16
Best:          28w 1d     Det. By:  LMP  (06/13/15)          EDD:    03/19/16
Cervix Uterus Adnexa

Cervix
Length:            2.4  cm.
Measured transvaginally. No funneling or dynamic changes visualized.
Impression

SIUP at 28+1 weeks
Normal amniotic fluid volume
EV views of cervix: mild funneling; CL = 2.4 cms
Recommendations

Continue vaginal progesterone
Follow-up as clinically indicated

## 2016-05-01 ENCOUNTER — Ambulatory Visit: Payer: Medicaid Other | Admitting: Student

## 2016-05-15 ENCOUNTER — Ambulatory Visit (HOSPITAL_COMMUNITY)
Admission: EM | Admit: 2016-05-15 | Discharge: 2016-05-15 | Disposition: A | Discharge status: Home / Self Care | Payer: Medicaid Other | Attending: Family Medicine | Admitting: Family Medicine

## 2016-05-15 ENCOUNTER — Encounter (HOSPITAL_COMMUNITY): Payer: Self-pay | Admitting: Emergency Medicine

## 2016-05-15 DIAGNOSIS — H1013 Acute atopic conjunctivitis, bilateral: Secondary | ICD-10-CM | POA: Diagnosis not present

## 2016-05-15 NOTE — Discharge Instructions (Signed)
Allergic Conjunctivitis Zaditor eye drops, one in each eye twice a day for allergies. Allergic conjunctivitis is inflammation of the clear membrane that covers the white part of your eye and the inner surface of your eyelid (conjunctiva), and it is caused by allergies. The blood vessels in the conjunctiva become inflamed, and this causes the eye to become red or pink, and it often causes itchiness in the eye. Allergic conjunctivitis cannot be spread by one person to another person (noncontagious). CAUSES This condition is caused by an allergic reaction. Common causes of an allergic reaction (allergens) include: 1. Dust. 2. Pollen. 3. Mold. 4. Animal dander or secretions. RISK FACTORS This condition is more likely to develop if you are exposed to high levels of allergens that cause the allergic reaction. This might include being outdoors when air pollen levels are high or being around animals that you are allergic to. SYMPTOMS Symptoms of this condition may include: 1. Eye redness. 2. Tearing of the eyes. 3. Watery eyes. 4. Itchy eyes. 5. Burning feeling in the eyes. 6. Clear drainage from the eyes. 7. Swollen eyelids. DIAGNOSIS This condition may be diagnosed by medical history and physical exam. If you have drainage from your eyes, it may be tested to rule out other causes of conjunctivitis. TREATMENT Treatment for this condition often includes medicines. These may be eye drops, ointments, or oral medicines. They may be prescription medicines or over-the-counter medicines. HOME CARE INSTRUCTIONS  Take or apply medicines only as directed by your health care provider.  Do not touch or rub your eyes.  Do not wear contact lenses until the inflammation is gone. Wear glasses instead.  Do not wear eye makeup until the inflammation is gone.  Apply a cool, clean washcloth to your eye for 10-20 minutes, 3-4 times a day.  Try to avoid whatever allergen is causing the allergic  reaction. SEEK MEDICAL CARE IF:  Your symptoms get worse.  You have pus draining from your eye.  You have new symptoms.  You have a fever.   This information is not intended to replace advice given to you by your health care provider. Make sure you discuss any questions you have with your health care provider.   Document Released: 02/23/2003 Document Revised: 12/24/2014 Document Reviewed: 09/14/2014 Elsevier Interactive Patient Education 2016 [employer].  How to Use Eye Drops and Eye Ointments HOW TO APPLY EYE DROPS Follow these steps when applying eye drops: 5. Wash your hands. 6. Tilt your head back. 7. Put a finger under your eye and use it to gently pull your lower lid downward. Keep that finger in place. 8. Using your other hand, hold the dropper between your thumb and index finger. 9. Position the dropper just over the edge of the lower lid. Hold it as close to your eye as you can without touching the dropper to your eye. 10. Steady your hand. One way to do this is to lean your index finger against your brow. 11. Look up. 12. Slowly and gently squeeze one drop of medicine into your eye. 13. Close your eye. 14. Place a finger between your lower eyelid and your nose. Press gently for 2 minutes. This increases the amount of time that the medicine is exposed to the eye. It also reduces side effects that can develop if the drop gets into the bloodstream through the nose. HOW TO APPLY EYE OINTMENTS Follow these steps when applying eye ointments: 8. Wash your hands. 9. Put a finger under your eye  and use it to gently pull your lower lid downward. Keep that finger in place. 10. Using your other hand, place the tip of the tube between your thumb and index finger with the remaining fingers braced against your cheek or nose. 11. Hold the tube just over the edge of your lower lid without touching the tube to your lid or eyeball. 12. Look up. 13. Line the inner part of your lower lid  with ointment. 14. Gently pull up on your upper lid and look down. This will force the ointment to spread over the surface of the eye. 15. Release the upper lid. 16. If you can, close your eyes for 1-2 minutes. Do not rub your eyes. If you applied the ointment correctly, your vision will be blurry for a few minutes. This is normal. ADDITIONAL INFORMATION  Make sure to use the eye drops or ointment as told by your health care provider.  If you have been told to use both eye drops and an eye ointment, apply the eye drops first, then wait 3-4 minutes before you apply the ointment.  Try not to touch the tip of the dropper or tube to your eye. A dropper or tube that has touched the eye can become contaminated.   This information is not intended to replace advice given to you by your health care provider. Make sure you discuss any questions you have with your health care provider.   Document Released: 03/11/2001 Document Revised: 04/19/2015 Document Reviewed: 11/29/2014 Elsevier Interactive Patient Education [employer]

## 2016-05-15 NOTE — ED Provider Notes (Signed)
CSN: [id number]     Arrival date & time 05/15/16  1734 History   First MD Initiated Contact with Patient 05/15/16 1739     Chief Complaint  Patient presents with  . Eye Problem   (Consider location/radiation/quality/duration/timing/severity/associated sxs/prior Treatment) HPI Comments: 30 year old female complaining of redness to the eyes off and on for the past 4 days. Denies itching denies drainage, denies visual changes. Denies pain. She is concerned because she has a small baby and one of her children had a bacterial conjunctivitis last week.  Patient is a 30 y.o. female presenting with eye problem.  Eye Problem Associated symptoms: redness   Associated symptoms: no discharge, no itching and no photophobia     Past Medical History  Diagnosis Date  . Gallstones   . Crohn's disease in remission [facility])     Hasn't had symptoms since 2004   Past Surgical History  Procedure Laterality Date  . Induced abortion  02/2012  . Cholecystectomy  08/28/2012    Procedure: LAPAROSCOPIC CHOLECYSTECTOMY WITH INTRAOPERATIVE CHOLANGIOGRAM;  Surgeon: [name], DO;  Location: [facility] OR;  Service: General;  Laterality: N/A;  . Cesarean section N/A 06/16/2013    Procedure: CESAREAN SECTION;  Surgeon: Reva Bores, MD;  Location: [facility] ORS;  Service: Obstetrics;  Laterality: N/A;   Family History  Problem Relation Age of Onset  . Other Neg Hx   . Diabetes Neg Hx   . Heart disease Neg Hx   . Hyperlipidemia Neg Hx   . Hypertension Neg Hx   . Stroke Neg Hx    Social History  Substance Use Topics  . Smoking status: Never Smoker   . Smokeless tobacco: Never Used  . Alcohol Use: No   OB History    Gravida Para Term Preterm AB TAB SAB Ectopic Multiple Living   0  0 3      Obstetric Comments   2014 LTCS with extension of hysterotomy inferiorly     Review of Systems  Constitutional: Negative.   HENT: Positive for postnasal drip. Negative for ear pain and sore throat.   Eyes: Positive for  redness. Negative for photophobia, pain, discharge, itching and visual disturbance.  Respiratory: Negative.  Negative for cough and shortness of breath.   All other systems reviewed and are negative.   Allergies  Review of patient's allergies indicates no known allergies.  Home Medications   Prior to Admission medications   Medication Sig Start Date End Date Taking? Authorizing Provider  acetaminophen (TYLENOL) 325 MG tablet Take 2 tablets (650 mg total) by mouth every 4 (four) hours as needed (for pain scale < 4). 03/19/16   Kathrynn Running, MD  ibuprofen (ADVIL,MOTRIN) 600 MG tablet Take 1 tablet (600 mg total) by mouth every 6 (six) hours. 03/19/16   Kathrynn Running, MD  Prenatal Vit-Fe Fumarate-FA (PRENATAL MULTIVITAMIN) TABS tablet Take 1 tablet by mouth daily at 12 noon. Patient taking differently: Take 1 tablet by mouth daily at 12 noon.  08/23/15   [name], MD   Meds Ordered and Administered this Visit  Medications - No data to display  BP 122/78 mmHg  Pulse 56  Temp(Src) 98.4 F (36.9 C) (Oral)  Resp 16  SpO2 100%  LMP 05/07/2016 (Exact Date) No data found.   Physical Exam  Constitutional: She is oriented to person, place, and time. She appears well-developed and well-nourished. No distress.  HENT:  Unable to visualize oropharynx due to patient's untenable  retraction of the tongue  Eyes:  Mild/minor erythema of the lower conjunctiva. No drainage. Sclera clear. No swelling.  Neck: Normal range of motion. Neck supple.  Cardiovascular: Normal rate.   Pulmonary/Chest: Effort normal.  Lymphadenopathy:    She has no cervical adenopathy.  Neurological: She is alert and oriented to person, place, and time. She exhibits normal muscle tone.  Skin: Skin is warm.  Nursing note and vitals reviewed.   ED Course  Procedures (including critical care time)  Labs Review Labs Reviewed - No data to display  Imaging Review No results found.   Visual Acuity  Review  Right Eye Distance: 20/25 Left Eye Distance: 20/25 Bilateral Distance: 20/25  Right Eye Near:   Left Eye Near:    Bilateral Near:         MDM   1. Allergic conjunctivitis, bilateral    Zaditor eye drops, one in each eye twice a day for allergies.    Hayden Rasmussen, NP 05/15/16 [phone]

## 2016-05-15 NOTE — ED Notes (Signed)
The patient presented to the [facility] with a complaint of bilateral eye redness and irritation x 5 days. The patient stated that her daughter recently had conjunctivitis and she used her drops.

## 2016-06-04 ENCOUNTER — Ambulatory Visit: Payer: Medicaid Other | Admitting: Obstetrics & Gynecology

## 2016-06-06 ENCOUNTER — Encounter (HOSPITAL_COMMUNITY): Payer: Self-pay | Admitting: Emergency Medicine

## 2016-06-06 ENCOUNTER — Ambulatory Visit (HOSPITAL_COMMUNITY)
Admission: EM | Admit: 2016-06-06 | Discharge: 2016-06-06 | Disposition: A | Discharge status: Home / Self Care | Payer: No Typology Code available for payment source | Attending: Emergency Medicine | Admitting: Emergency Medicine

## 2016-06-06 DIAGNOSIS — M62830 Muscle spasm of back: Secondary | ICD-10-CM

## 2016-06-06 DIAGNOSIS — S161XXA Strain of muscle, fascia and tendon at neck level, initial encounter: Secondary | ICD-10-CM | POA: Diagnosis not present

## 2016-06-06 MED ORDER — CYCLOBENZAPRINE HCL 5 MG PO TABS: 5.0000 mg | ORAL_TABLET | Freq: Three times a day (TID) | ORAL | Status: DC | PRN

## 2016-06-06 MED ORDER — IBUPROFEN 600 MG PO TABS: 600.0000 mg | ORAL_TABLET | Freq: Three times a day (TID) | ORAL | Status: DC
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
## 2016-06-13 ENCOUNTER — Ambulatory Visit (INDEPENDENT_AMBULATORY_CARE_PROVIDER_SITE_OTHER): Payer: Medicaid Other | Admitting: General Practice

## 2016-06-13 VITALS — BP 130/66 | HR 52 | Ht 68.0 in | Wt 167.0 lb

## 2016-06-13 DIAGNOSIS — Z3042 Encounter for surveillance of injectable contraceptive: Secondary | ICD-10-CM

## 2016-06-13 MED ORDER — MEDROXYPROGESTERONE ACETATE 150 MG/ML IM SUSP
150.0000 mg | Freq: Once | INTRAMUSCULAR | Status: AC
  Administered 2016-06-13: 150 mg via INTRAMUSCULAR

## 2016-06-27 ENCOUNTER — Ambulatory Visit: Payer: Medicaid Other | Admitting: Obstetrics & Gynecology

## 2016-08-29 ENCOUNTER — Ambulatory Visit (INDEPENDENT_AMBULATORY_CARE_PROVIDER_SITE_OTHER): Payer: Medicaid Other | Admitting: *Deleted

## 2016-08-29 VITALS — BP 129/72 | HR 64

## 2016-08-29 DIAGNOSIS — Z3042 Encounter for surveillance of injectable contraceptive: Secondary | ICD-10-CM | POA: Diagnosis not present

## 2016-08-29 MED ORDER — MEDROXYPROGESTERONE ACETATE 150 MG/ML IM SUSP
150.0000 mg | Freq: Once | INTRAMUSCULAR | Status: AC
  Administered 2016-08-29: 150 mg via INTRAMUSCULAR

## 2016-11-14 ENCOUNTER — Ambulatory Visit: Payer: Medicaid Other

## 2016-11-16 ENCOUNTER — Ambulatory Visit (INDEPENDENT_AMBULATORY_CARE_PROVIDER_SITE_OTHER): Payer: Medicaid Other | Admitting: *Deleted

## 2016-11-16 VITALS — BP 123/69 | HR 76 | Wt 169.8 lb

## 2016-11-16 DIAGNOSIS — Z3042 Encounter for surveillance of injectable contraceptive: Secondary | ICD-10-CM | POA: Diagnosis not present

## 2016-11-16 MED ORDER — MEDROXYPROGESTERONE ACETATE 150 MG/ML IM SUSP
150.0000 mg | Freq: Once | INTRAMUSCULAR | Status: AC
  Administered 2016-11-16: 150 mg via INTRAMUSCULAR

## 2017-01-18 ENCOUNTER — Emergency Department (HOSPITAL_COMMUNITY)
Admission: EM | Admit: 2017-01-18 | Discharge: 2017-01-18 | Disposition: A | Discharge status: Home / Self Care | Payer: Medicaid Other | Attending: Emergency Medicine | Admitting: Emergency Medicine

## 2017-01-18 ENCOUNTER — Encounter (HOSPITAL_COMMUNITY): Payer: Self-pay

## 2017-01-18 DIAGNOSIS — J069 Acute upper respiratory infection, unspecified: Secondary | ICD-10-CM

## 2017-01-18 DIAGNOSIS — Z79899 Other long term (current) drug therapy: Secondary | ICD-10-CM | POA: Insufficient documentation

## 2017-01-18 MED ORDER — BENZONATATE 100 MG PO CAPS: 100.0000 mg | ORAL_CAPSULE | Freq: Three times a day (TID) | ORAL | 0 refills | Status: DC

## 2017-01-18 NOTE — ED Triage Notes (Signed)
Pt started with cough congestion x 2 days. Body aches and fever yesterday.

## 2017-01-18 NOTE — ED Provider Notes (Signed)
[facility]-EMERGENCY DEPT Provider Note   CSN: [id number] Arrival date & time: 01/18/17  0747     History   Chief Complaint Chief Complaint  Patient presents with  . Fever  . Cough    HPI Katherine Lindsey is a 31 y.o. female.  31 year old female presents with 2 days of cough and congestion and URI symptoms. Had temperature yesterday up to 101 and treated with antipyretics. Cough is been nonproductive and not associated with dyspnea. No vomiting or diarrhea. Has had a mild sore throat but has no trouble swallowing. No ear pain. No urinary symptoms. Nothing makes her symptoms better.      Past Medical History:  Diagnosis Date  . Crohn's disease in remission [facility])    Hasn't had symptoms since 2004  . Gallstones     Patient Active Problem List   Diagnosis Date Noted  . VBAC, delivered 03/19/2016  . Active labor at term 03/16/2016  . GBS (group B Streptococcus carrier), +RV culture, currently pregnant 03/07/2016  . Previous cesarean delivery, antepartum 11/28/2015  . Short cervix affecting pregnancy 11/18/2015  . Supervision of low-risk pregnancy 08/23/2015  . Hammertoe 09/21/2014    Past Surgical History:  Procedure Laterality Date  . CESAREAN SECTION N/A 06/16/2013   Procedure: CESAREAN SECTION;  Surgeon: Reva Bores, MD;  Location: [facility] ORS;  Service: Obstetrics;  Laterality: N/A;  . CHOLECYSTECTOMY  08/28/2012   Procedure: LAPAROSCOPIC CHOLECYSTECTOMY WITH INTRAOPERATIVE CHOLANGIOGRAM;  Surgeon: [name], DO;  Location: [facility] OR;  Service: General;  Laterality: N/A;  . INDUCED ABORTION  02/2012    OB History    Gravida Para Term Preterm AB Living   4 3 3   1 3    SAB TAB Ectopic Multiple Live Births   0 1   0 3      Obstetric Comments   2014 LTCS with extension of hysterotomy inferiorly       Home Medications    Prior to Admission medications   Medication Sig Start Date End Date Taking? Authorizing Provider  ibuprofen (ADVIL,MOTRIN) 200 MG tablet Take 400  mg by mouth every 6 (six) hours as needed for fever or mild pain.   Yes Historical Provider, MD  medroxyPROGESTERone (DEPO-PROVERA) 150 MG/ML injection Inject 150 mg into the muscle every 3 (three) months.   Yes Historical Provider, MD  acetaminophen (TYLENOL) 325 MG tablet Take 2 tablets (650 mg total) by mouth every 4 (four) hours as needed (for pain scale < 4). Patient not taking: Reported on 01/18/2017 03/19/16   Kathrynn Running, MD  ibuprofen (ADVIL,MOTRIN) 600 MG tablet Take 1 tablet (600 mg total) by mouth 3 (three) times daily. Patient not taking: Reported on 01/18/2017 06/06/16   Charm Rings, MD    Family History Family History  Problem Relation Age of Onset  . Other Neg Hx   . Diabetes Neg Hx   . Heart disease Neg Hx   . Hyperlipidemia Neg Hx   . Hypertension Neg Hx   . Stroke Neg Hx     Social History Social History  Substance Use Topics  . Smoking status: Never Smoker  . Smokeless tobacco: Never Used  . Alcohol use No     Allergies   Patient has no known allergies.   Review of Systems Review of Systems  All other systems reviewed and are negative.    Physical Exam Updated Vital Signs BP 132/88 (BP Location: Right Arm)   Pulse 97   Temp 99.5 F (37.5  C) (Oral)   Resp 17   SpO2 99%   Physical Exam  Constitutional: She is oriented to person, place, and time. She appears well-developed and well-nourished.  Non-toxic appearance. No distress.  HENT:  Head: Normocephalic and atraumatic.  Eyes: Conjunctivae, EOM and lids are normal. Pupils are equal, round, and reactive to light.  Neck: Normal range of motion. Neck supple. No tracheal deviation present. No thyroid mass present.  Cardiovascular: Normal rate, regular rhythm and normal heart sounds.  Exam reveals no gallop.   No murmur heard. Pulmonary/Chest: Effort normal and breath sounds normal. No stridor. No respiratory distress. She has no decreased breath sounds. She has no wheezes. She has no rhonchi. She  has no rales.  Abdominal: Soft. Normal appearance and bowel sounds are normal. She exhibits no distension. There is no tenderness. There is no rebound and no CVA tenderness.  Musculoskeletal: Normal range of motion. She exhibits no edema or tenderness.  Neurological: She is alert and oriented to person, place, and time. She has normal strength. No cranial nerve deficit or sensory deficit. GCS eye subscore is 4. GCS verbal subscore is 5. GCS motor subscore is 6.  Skin: Skin is warm and dry. No abrasion and no rash noted.  Psychiatric: She has a normal mood and affect. Her speech is normal and behavior is normal.  Nursing note and vitals reviewed.    ED Treatments / Results  Labs (all labs ordered are listed, but only abnormal results are displayed) Labs Reviewed - No data to display  EKG  EKG Interpretation None       Radiology No results found.  Procedures Procedures (including critical care time)  Medications Ordered in ED Medications - No data to display   Initial Impression / Assessment and Plan / ED Course  I have reviewed the triage vital signs and the nursing notes.  Pertinent labs & imaging results that were available during my care of the patient were reviewed by me and considered in my medical decision making (see chart for details).     Patient here with cough and congestion consistent with URI. Has had symptoms for 2 days. Will treat with supportive measures.  Final Clinical Impressions(s) / ED Diagnoses   Final diagnoses:  None    New Prescriptions New Prescriptions   No medications on file     Lorre Nick, MD 01/18/17 [phone]

## 2017-01-23 ENCOUNTER — Encounter: Payer: Self-pay | Admitting: Obstetrics and Gynecology

## 2017-02-01 ENCOUNTER — Ambulatory Visit: Payer: Medicaid Other

## 2017-02-05 ENCOUNTER — Ambulatory Visit (INDEPENDENT_AMBULATORY_CARE_PROVIDER_SITE_OTHER): Payer: Medicaid Other | Admitting: Advanced Practice Midwife

## 2017-02-05 ENCOUNTER — Other Ambulatory Visit (HOSPITAL_COMMUNITY)
Admission: RE | Admit: 2017-02-05 | Discharge: 2017-02-05 | Disposition: A | Discharge status: Home / Self Care | Payer: Medicaid Other | Source: Ambulatory Visit | Attending: Advanced Practice Midwife | Admitting: Advanced Practice Midwife

## 2017-02-05 VITALS — BP 113/83 | HR 63 | Ht 68.0 in | Wt 169.0 lb

## 2017-02-05 DIAGNOSIS — Z1151 Encounter for screening for human papillomavirus (HPV): Secondary | ICD-10-CM | POA: Insufficient documentation

## 2017-02-05 DIAGNOSIS — R87810 Cervical high risk human papillomavirus (HPV) DNA test positive: Secondary | ICD-10-CM | POA: Insufficient documentation

## 2017-02-05 DIAGNOSIS — Z309 Encounter for contraceptive management, unspecified: Secondary | ICD-10-CM

## 2017-02-05 DIAGNOSIS — Z124 Encounter for screening for malignant neoplasm of cervix: Secondary | ICD-10-CM

## 2017-02-05 DIAGNOSIS — Z01419 Encounter for gynecological examination (general) (routine) without abnormal findings: Secondary | ICD-10-CM

## 2017-02-05 DIAGNOSIS — Z0000 Encounter for general adult medical examination without abnormal findings: Secondary | ICD-10-CM | POA: Diagnosis not present

## 2017-02-05 DIAGNOSIS — Z3042 Encounter for surveillance of injectable contraceptive: Secondary | ICD-10-CM

## 2017-02-05 MED ORDER — MEDROXYPROGESTERONE ACETATE 150 MG/ML IM SUSP
150.0000 mg | Freq: Once | INTRAMUSCULAR | Status: AC
  Administered 2017-02-05: 150 mg via INTRAMUSCULAR

## 2017-02-05 NOTE — Patient Instructions (Signed)
Breast Self-Awareness Introduction Breast self-awareness means being familiar with how your breasts look and feel. It involves checking your breasts regularly and reporting any changes to your health care provider. Practicing breast self-awareness is important. A change in your breasts can be a sign of a serious medical problem. Being familiar with how your breasts look and feel allows you to find any problems early, when treatment is more likely to be successful. All women should practice breast self-awareness, including women who have had breast implants. How to do a breast self-exam One way to learn what is normal for your breasts and whether your breasts are changing is to do a breast self-exam. To do a breast self-exam: Look for Changes  1. Remove all the clothing above your waist. 2. Stand in front of a mirror in a room with good lighting. 3. Put your hands on your hips. 4. Push your hands firmly downward. 5. Compare your breasts in the mirror. Look for differences between them (asymmetry), such as:  Differences in shape.  Differences in size.  Puckers, dips, and bumps in one breast and not the other. 6. Look at each breast for changes in your skin, such as:  Redness.  Scaly areas. 7. Look for changes in your nipples, such as:  Discharge.  Bleeding.  Dimpling.  Redness.  A change in position. Feel for Changes  Carefully feel your breasts for lumps and changes. It is best to do this while lying on your back on the floor and again while sitting or standing in the shower or tub with soapy water on your skin. Feel each breast in the following way:  Place the arm on the side of the breast you are examining above your head.  Feel your breast with the other hand.  Start in the nipple area and make  inch (2 cm) overlapping circles to feel your breast. Use the pads of your three middle fingers to do this. Apply light pressure, then medium pressure, then firm pressure. The light  pressure will allow you to feel the tissue closest to the skin. The medium pressure will allow you to feel the tissue that is a little deeper. The firm pressure will allow you to feel the tissue close to the ribs.  Continue the overlapping circles, moving downward over the breast until you feel your ribs below your breast.  Move one finger-width toward the center of the body. Continue to use the  inch (2 cm) overlapping circles to feel your breast as you move slowly up toward your collarbone.  Continue the up and down exam using all three pressures until you reach your armpit. Write Down What You Find  Write down what is normal for each breast and any changes that you find. Keep a written record with breast changes or normal findings for each breast. By writing this information down, you do not need to depend only on memory for size, tenderness, or location. Write down where you are in your menstrual cycle, if you are still menstruating. If you are having trouble noticing differences in your breasts, do not get discouraged. With time you will become more familiar with the variations in your breasts and more comfortable with the exam. How often should I examine my breasts? Examine your breasts every month. If you are breastfeeding, the best time to examine your breasts is after a feeding or after using a breast pump. If you menstruate, the best time to examine your breasts is 5-7 days after your   period is over. During your period, your breasts are lumpier, and it may be more difficult to notice changes. When should I see my health care provider? See your health care provider if you notice:  A change in shape or size of your breasts or nipples.  A change in the skin of your breast or nipples, such as a reddened or scaly area.  Unusual discharge from your nipples.  A lump or thick area that was not there before.  Pain in your breasts.  Anything that concerns you. This information is not  intended to replace advice given to you by your health care provider. Make sure you discuss any questions you have with your health care provider. Document Released: 12/03/2005 Document Revised: 05/10/2016 Document Reviewed: 10/23/2015  2017 Elsevier  

## 2017-02-05 NOTE — Progress Notes (Signed)
Subjective:     Patient ID: Katherine Lindsey, female   DOB: [birth year], 31 y.o.   MRN: [id number]  Here for Depo and Pap. No current complaints. Had spotting previously w/ Depo that has resolved. No have periods. Stopped BF her 59-month old. No problems w/ breasts.    Review of Systems  Gastrointestinal: Negative for abdominal pain.  Genitourinary: Negative for vaginal bleeding and vaginal discharge.     Objective:   Physical Exam  Constitutional: She appears well-developed and well-nourished. No distress.  Cardiovascular: Normal rate.   Pulmonary/Chest: Effort normal. No respiratory distress.  Abdominal: Soft. She exhibits no distension. There is no tenderness.  Genitourinary: There is no rash or lesion on the right labia. There is no rash or lesion on the left labia. Uterus is not deviated, not enlarged and not tender. Cervix exhibits no motion tenderness, no discharge and no friability. Right adnexum displays no mass, no tenderness and no fullness. Left adnexum displays no mass, no tenderness and no fullness. There is bleeding (scant) in the vagina. No tenderness in the vagina. No vaginal discharge found.  Musculoskeletal: She exhibits no edema.  Lymphadenopathy:       Right: No inguinal adenopathy present.       Left: No inguinal adenopathy present.  Neurological: She is alert.  Skin: Skin is warm and dry.  Psychiatric: She has a normal mood and affect.  Nursing note and vitals reviewed.  BP 113/83   Pulse 63   Ht 5\' 8"  (1.727 m)   Wt 169 lb (76.7 kg)   BMI 25.70 kg/m      Assessment:     1. Well woman exam  - Cytology - PAP - medroxyPROGESTERone (DEPO-PROVERA) injection 150 mg; Inject 1 mL (150 mg total) into the muscle once.  2. Encounter for management and injection of injectable progestin contraceptive  - Cytology - PAP - medroxyPROGESTERone (DEPO-PROVERA) injection 150 mg; Inject 1 mL (150 mg total) into the muscle once.  3. Encounter for screening for  malignant neoplasm of cervix  - Cytology - PAP - medroxyPROGESTERone (DEPO-PROVERA) injection 150 mg; Inject 1 mL (150 mg total) into the muscle once.     Plan:     Depo in 12 weeks F/U Annual Gyn exam in 1 year or PRN SBE    [place], [place] 02/05/2017 9:50 AM

## 2017-02-06 ENCOUNTER — Ambulatory Visit: Payer: Medicaid Other | Admitting: Obstetrics and Gynecology

## 2017-02-06 LAB — CYTOLOGY - PAP
Chlamydia: NEGATIVE
DIAGNOSIS: NEGATIVE
HPV 16/18/45 GENOTYPING: POSITIVE — AB
HPV: DETECTED — AB
Neisseria Gonorrhea: NEGATIVE

## 2017-02-07 ENCOUNTER — Encounter: Payer: Self-pay | Admitting: Advanced Practice Midwife

## 2017-02-07 DIAGNOSIS — B977 Papillomavirus as the cause of diseases classified elsewhere: Secondary | ICD-10-CM | POA: Insufficient documentation

## 2017-02-13 ENCOUNTER — Telehealth: Payer: Self-pay | Admitting: *Deleted

## 2017-02-13 NOTE — Telephone Encounter (Addendum)
Per Dorathy Kinsman, CNM need to call patient with results of pap and appointment for colposcopy.  I called Katherine Lindsey and explained results of pap/ hrhpv results and reccomendation and that front office will call her with an appointment for colposcopy.   She voices understanding.

## 2017-02-28 ENCOUNTER — Ambulatory Visit: Payer: Medicaid Other | Admitting: Obstetrics and Gynecology

## 2017-03-27 ENCOUNTER — Ambulatory Visit: Payer: Medicaid Other | Admitting: Obstetrics and Gynecology

## 2017-04-30 ENCOUNTER — Ambulatory Visit (INDEPENDENT_AMBULATORY_CARE_PROVIDER_SITE_OTHER): Payer: Medicaid Other | Admitting: *Deleted

## 2017-04-30 DIAGNOSIS — Z3042 Encounter for surveillance of injectable contraceptive: Secondary | ICD-10-CM

## 2017-04-30 MED ORDER — MEDROXYPROGESTERONE ACETATE 150 MG/ML IM SUSP
150.0000 mg | Freq: Once | INTRAMUSCULAR | Status: AC
  Administered 2017-04-30: 150 mg via INTRAMUSCULAR

## 2017-06-06 ENCOUNTER — Ambulatory Visit: Payer: Medicaid Other | Admitting: Family Medicine

## 2017-06-12 ENCOUNTER — Telehealth: Payer: Self-pay

## 2017-06-12 NOTE — Telephone Encounter (Signed)
LM for pt that she missed her appt scheduled on June 21st and that it is important that you call and reschedule.  Letter sent.

## 2017-06-24 ENCOUNTER — Ambulatory Visit: Payer: Medicaid Other | Admitting: Obstetrics & Gynecology

## 2017-07-15 ENCOUNTER — Ambulatory Visit: Payer: Self-pay | Admitting: Obstetrics & Gynecology

## 2017-07-15 ENCOUNTER — Ambulatory Visit (INDEPENDENT_AMBULATORY_CARE_PROVIDER_SITE_OTHER): Payer: Medicaid Other | Admitting: *Deleted

## 2017-07-15 VITALS — BP 127/55 | HR 55

## 2017-07-15 DIAGNOSIS — Z308 Encounter for other contraceptive management: Secondary | ICD-10-CM

## 2017-07-15 DIAGNOSIS — Z3042 Encounter for surveillance of injectable contraceptive: Secondary | ICD-10-CM

## 2017-07-15 MED ORDER — MEDROXYPROGESTERONE ACETATE 150 MG/ML IM SUSP
150.0000 mg | Freq: Once | INTRAMUSCULAR | Status: AC
  Administered 2017-07-15: 150 mg via INTRAMUSCULAR

## 2017-07-16 ENCOUNTER — Ambulatory Visit: Payer: Medicaid Other

## 2017-09-30 ENCOUNTER — Ambulatory Visit: Payer: Medicaid Other

## 2017-10-01 ENCOUNTER — Ambulatory Visit: Payer: Self-pay

## 2017-10-04 ENCOUNTER — Ambulatory Visit (INDEPENDENT_AMBULATORY_CARE_PROVIDER_SITE_OTHER): Payer: Medicaid Other | Admitting: General Practice

## 2017-10-04 VITALS — BP 134/91 | HR 62 | Ht 67.0 in | Wt 174.0 lb

## 2017-10-04 DIAGNOSIS — Z3042 Encounter for surveillance of injectable contraceptive: Secondary | ICD-10-CM | POA: Diagnosis present

## 2017-10-04 MED ORDER — MEDROXYPROGESTERONE ACETATE 150 MG/ML IM SUSP
150.0000 mg | Freq: Once | INTRAMUSCULAR | Status: AC
  Administered 2017-10-04: 150 mg via INTRAMUSCULAR

## 2017-10-04 NOTE — Progress Notes (Signed)
Agree with nursing staff's management of this patient's clinic encounter.  Vonzella NippleJulie Brady Schiller, PA-C 10/04/2017 9:27 AM

## 2017-11-18 ENCOUNTER — Other Ambulatory Visit: Payer: Self-pay

## 2017-11-18 ENCOUNTER — Emergency Department (HOSPITAL_COMMUNITY)
Admission: EM | Admit: 2017-11-18 | Discharge: 2017-11-18 | Disposition: A | Discharge status: Home / Self Care | Payer: Medicaid Other | Attending: Emergency Medicine | Admitting: Emergency Medicine

## 2017-11-18 ENCOUNTER — Encounter (HOSPITAL_COMMUNITY): Payer: Self-pay | Admitting: Emergency Medicine

## 2017-11-18 DIAGNOSIS — L02412 Cutaneous abscess of left axilla: Secondary | ICD-10-CM | POA: Insufficient documentation

## 2017-11-18 DIAGNOSIS — Z9049 Acquired absence of other specified parts of digestive tract: Secondary | ICD-10-CM | POA: Insufficient documentation

## 2017-11-18 DIAGNOSIS — Z79899 Other long term (current) drug therapy: Secondary | ICD-10-CM | POA: Insufficient documentation

## 2017-11-18 DIAGNOSIS — L02411 Cutaneous abscess of right axilla: Secondary | ICD-10-CM

## 2017-11-18 MED ORDER — LIDOCAINE HCL 1 % IJ SOLN
INTRAMUSCULAR | Status: AC
  Administered 2017-11-18: 20 mL
  Filled 2017-11-18: qty 20

## 2017-11-18 MED ORDER — HYDROCODONE-ACETAMINOPHEN 5-325 MG PO TABS: 2.0000 | ORAL_TABLET | ORAL | 0 refills | Status: DC | PRN

## 2017-11-18 MED ORDER — SULFAMETHOXAZOLE-TRIMETHOPRIM 800-160 MG PO TABS: 1.0000 | ORAL_TABLET | Freq: Two times a day (BID) | ORAL | 0 refills | Status: AC

## 2017-11-18 NOTE — ED Triage Notes (Signed)
Pt c/o boil L axilla. Pt states it started a week ago as a pimple and approximately 2 days ago became much larger and painful.

## 2017-11-18 NOTE — Discharge Instructions (Signed)
Return if any problems.

## 2017-11-18 NOTE — ED Notes (Signed)
Pt reports that she had a " boil"  to the left axilla that stated 2 days ago. Pt reports that she had 1  In the same place 2 weeks ago,  Tender on touch  10/10 however went away on its own. Pt is accompanied with mother and is alert and oriented x 4, and verbally responsive.Marland Kitchen

## 2017-11-19 NOTE — ED Provider Notes (Signed)
[facility]-EMERGENCY DEPT Provider Note   CSN: [id number] Arrival date & time: 11/18/17  1143     History   Chief Complaint Chief Complaint  Patient presents with  . Recurrent Skin Infections    HPI Katherine Lindsey is a 31 y.o. female.  The history is provided by the patient. No language interpreter was used.  Abscess  Location:  Shoulder/arm Shoulder/arm abscess location:  L axilla Size:  2 Abscess quality: painful, redness and warmth   Red streaking: no   Duration:  2 days Progression:  Worsening Pain details:    Quality:  Sharp   Duration:  2 days   Timing:  Constant   Progression:  Worsening Chronicity:  New Context: not diabetes   Relieved by:  Nothing Worsened by:  Nothing Ineffective treatments:  None tried Associated symptoms: no nausea     Past Medical History:  Diagnosis Date  . Crohn's disease in remission [facility])    Hasn't had symptoms since 2004  . Gallstones     Patient Active Problem List   Diagnosis Date Noted  . High risk HPV infection 02/07/2017  . VBAC, delivered 03/19/2016  . History of low transverse cesarean section 11/28/2015  . Hammertoe 09/21/2014    Past Surgical History:  Procedure Laterality Date  . CESAREAN SECTION N/A 06/16/2013   Procedure: CESAREAN SECTION;  Surgeon: Reva Bores, MD;  Location: [facility];  Service: Obstetrics;  Laterality: N/A;  . CHOLECYSTECTOMY  08/28/2012   Procedure: LAPAROSCOPIC CHOLECYSTECTOMY WITH INTRAOPERATIVE CHOLANGIOGRAM;  Surgeon: [name], DO;  Location: [facility] OR;  Service: General;  Laterality: N/A;  . INDUCED ABORTION  02/2012    OB History    Gravida Para Term Preterm AB Living   4 3 3   1 3    SAB TAB Ectopic Multiple Live Births   0 1   0 3      Obstetric Comments   2014 LTCS with extension of hysterotomy inferiorly       Home Medications    Prior to Admission medications   Medication Sig Start Date End Date Taking? Authorizing Provider  acetaminophen  (TYLENOL) 325 MG tablet Take 2 tablets (650 mg total) by mouth every 4 (four) hours as needed (for pain scale < 4). Patient not taking: Reported on 01/18/2017 03/19/16   Wouk, Wilfred Curtis, MD  benzonatate (TESSALON) 100 MG capsule Take 1 capsule (100 mg total) by mouth every 8 (eight) hours. Patient not taking: Reported on 02/05/2017 01/18/17   Lorre Nick, MD  HYDROcodone-acetaminophen (NORCO/VICODIN) 5-325 MG tablet Take 2 tablets by mouth every 4 (four) hours as needed. 11/18/17   Elson Areas, PA-C  ibuprofen (ADVIL,MOTRIN) 200 MG tablet Take 400 mg by mouth every 6 (six) hours as needed for fever or mild pain.    [provider]  ibuprofen (ADVIL,MOTRIN) 600 MG tablet Take 1 tablet (600 mg total) by mouth 3 (three) times daily. Patient not taking: Reported on 01/18/2017 06/06/16   Charm Rings, MD  medroxyPROGESTERone (DEPO-PROVERA) 150 MG/ML injection Inject 150 mg into the muscle every 3 (three) months.    [provider]  sulfamethoxazole-trimethoprim (BACTRIM DS,SEPTRA DS) 800-160 MG tablet Take 1 tablet by mouth 2 (two) times daily for 7 days. 11/18/17 11/25/17  Elson Areas, PA-C    Family History Family History  Problem Relation Age of Onset  . Other Neg Hx   . Diabetes Neg Hx   . Heart disease Neg Hx   .  Hyperlipidemia Neg Hx   . Hypertension Neg Hx   . Stroke Neg Hx     Social History Social History   Tobacco Use  . Smoking status: Never Smoker  . Smokeless tobacco: Never Used  Substance Use Topics  . Alcohol use: No    Alcohol/week: 0.6 oz    Types: 1 Glasses of wine per week  . Drug use: No     Allergies   Patient has no known allergies.   Review of Systems Review of Systems  Gastrointestinal: Negative for nausea.  All other systems reviewed and are negative.    Physical Exam Updated Vital Signs BP 137/79 (BP Location: Right Arm)   Pulse 64   Temp 98.4 F (36.9 C) (Oral)   Resp 20   Ht 5\' 8"  (1.727 m)   Wt 79.4 kg (175 lb)    SpO2 100%   BMI 26.61 kg/m   Physical Exam  Constitutional: She appears well-developed and well-nourished.  HENT:  Head: Normocephalic.  Cardiovascular: Normal rate.  Pulmonary/Chest: Breath sounds normal.  Musculoskeletal: She exhibits edema and tenderness.  2cm swollen area left axilla  Neurological: She is alert.  Skin: Skin is warm.  Psychiatric: She has a normal mood and affect.  Nursing note and vitals reviewed.    ED Treatments / Results  Labs (all labs ordered are listed, but only abnormal results are displayed) Labs Reviewed - No data to display  EKG  EKG Interpretation None       Radiology No results found.  Procedures .Marland Kitchen.Incision and Drainage Date/Time: 11/19/2017 11:12 AM Performed by: Elson AreasSofia, Leslie K, PA-C Authorized by: Elson AreasSofia, Leslie K, PA-C   Consent:    Consent obtained:  Verbal   Consent given by:  Patient   Risks discussed:  Damage to other organs   Alternatives discussed:  No treatment and alternative treatment Location:    Type:  Abscess   Size:  2   Location:  Upper extremity Pre-procedure details:    Skin preparation:  Betadine Anesthesia (see MAR for exact dosages):    Anesthesia method:  Local infiltration   Local anesthetic:  Lidocaine 2% w/o epi Procedure type:    Complexity:  Simple Procedure details:    Needle aspiration: yes     Incision types:  Single straight   Incision depth:  Subcutaneous   Scalpel blade:  11   Wound management:  Probed and deloculated   Drainage:  Purulent   Drainage amount:  Moderate   Wound treatment:  Wound left open   Packing materials:  None Post-procedure details:    Patient tolerance of procedure:  Tolerated with difficulty Comments:     Pt very anxious,  Family helped pt,     (including critical care time)  Medications Ordered in ED Medications  lidocaine (XYLOCAINE) 1 % (with pres) injection (20 mLs  Given 11/18/17 1409)     Initial Impression / Assessment and Plan / ED Course  I  have reviewed the triage vital signs and the nursing notes.  Pertinent labs & imaging results that were available during my care of the patient were reviewed by me and considered in my medical decision making (see chart for details).     Pt counseled.    Final Clinical Impressions(s) / ED Diagnoses   Final diagnoses:  Abscess of axilla, right    ED Discharge Orders        Ordered    sulfamethoxazole-trimethoprim (BACTRIM DS,SEPTRA DS) 800-160 MG tablet  2 times daily  11/18/17 1455    HYDROcodone-acetaminophen (NORCO/VICODIN) 5-325 MG tablet  Every 4 hours PRN     11/18/17 1455    An After Visit Summary was printed and given to the patient.    Elson Areas, [place] 11/19/17 1114    Lorre Nick, MD 11/22/17 [phone]

## 2017-12-04 ENCOUNTER — Emergency Department (HOSPITAL_COMMUNITY)
Admission: EM | Admit: 2017-12-04 | Discharge: 2017-12-04 | Disposition: A | Discharge status: Home / Self Care | Payer: Medicaid Other | Attending: Emergency Medicine | Admitting: Emergency Medicine

## 2017-12-04 ENCOUNTER — Encounter (HOSPITAL_COMMUNITY): Payer: Self-pay | Admitting: *Deleted

## 2017-12-04 ENCOUNTER — Other Ambulatory Visit: Payer: Self-pay

## 2017-12-04 DIAGNOSIS — L0291 Cutaneous abscess, unspecified: Secondary | ICD-10-CM

## 2017-12-04 DIAGNOSIS — L02411 Cutaneous abscess of right axilla: Secondary | ICD-10-CM | POA: Insufficient documentation

## 2017-12-04 MED ORDER — SULFAMETHOXAZOLE-TRIMETHOPRIM 800-160 MG PO TABS: 1.0000 | ORAL_TABLET | Freq: Two times a day (BID) | ORAL | 0 refills | Status: AC

## 2017-12-04 MED ORDER — IBUPROFEN 400 MG PO TABS
600.0000 mg | ORAL_TABLET | Freq: Once | ORAL | Status: AC
  Administered 2017-12-04: 600 mg via ORAL
  Filled 2017-12-04: qty 1

## 2017-12-04 MED ORDER — LIDOCAINE HCL 2 % IJ SOLN
20.0000 mL | Freq: Once | INTRAMUSCULAR | Status: AC
  Administered 2017-12-04: 400 mg
  Filled 2017-12-04: qty 20

## 2017-12-04 NOTE — ED Notes (Signed)
Patient is A&Ox4.  No signs of distress noted.  Please see providers complete history and physical exam.  

## 2017-12-04 NOTE — ED Notes (Signed)
PT states understanding of care given, follow up care, and medication prescribed. PT ambulated from ED to car with a steady gait. 

## 2017-12-04 NOTE — ED Provider Notes (Signed)
[facility] EMERGENCY DEPARTMENT Provider Note   CSN: [id number] Arrival date & time: 12/04/17  1758     History   Chief Complaint Chief Complaint  Patient presents with  . Lung Abcess    HPI Katherine Lindsey is a 31 y.o. female.  HPI   31 year old female presents today with complaints of left-sided axillary abscess.  Patient notes that she was seen in the emergency room on 11/18/2017 with abscess.  She had I&D performed and was discharged home on Bactrim.  She reports shortly after the I&D the abscess wound healed together and started to swell again.  She notes taking the Bactrim as directed.  She denies any fever chills nausea or vomiting, denies any significant surrounding redness but notes swelling.  Past Medical History:  Diagnosis Date  . Crohn's disease in remission [facility])    Hasn't had symptoms since 2004  . Gallstones     Patient Active Problem List   Diagnosis Date Noted  . High risk HPV infection 02/07/2017  . VBAC, delivered 03/19/2016  . History of low transverse cesarean section 11/28/2015  . Hammertoe 09/21/2014    Past Surgical History:  Procedure Laterality Date  . CESAREAN SECTION N/A 06/16/2013   Procedure: CESAREAN SECTION;  Surgeon: Reva Bores, MD;  Location: [facility];  Service: Obstetrics;  Laterality: N/A;  . CHOLECYSTECTOMY  08/28/2012   Procedure: LAPAROSCOPIC CHOLECYSTECTOMY WITH INTRAOPERATIVE CHOLANGIOGRAM;  Surgeon: [name], DO;  Location: [facility];  Service: General;  Laterality: N/A;  . INDUCED ABORTION  02/2012    OB History    Gravida Para Term Preterm AB Living   4 3 3   1 3    SAB TAB Ectopic Multiple Live Births   0 1   0 3      Obstetric Comments   2014 LTCS with extension of hysterotomy inferiorly       Home Medications    Prior to Admission medications   Medication Sig Start Date End Date Taking? Authorizing Provider  acetaminophen (TYLENOL) 325 MG tablet Take 2 tablets (650 mg total) by mouth  every 4 (four) hours as needed (for pain scale < 4). Patient not taking: Reported on 01/18/2017 03/19/16   Wouk, Wilfred Curtis, MD  benzonatate (TESSALON) 100 MG capsule Take 1 capsule (100 mg total) by mouth every 8 (eight) hours. Patient not taking: Reported on 02/05/2017 01/18/17   Lorre Nick, MD  HYDROcodone-acetaminophen (NORCO/VICODIN) 5-325 MG tablet Take 2 tablets by mouth every 4 (four) hours as needed. 11/18/17   Elson Areas, PA-C  ibuprofen (ADVIL,MOTRIN) 200 MG tablet Take 400 mg by mouth every 6 (six) hours as needed for fever or mild pain.    [provider]  ibuprofen (ADVIL,MOTRIN) 600 MG tablet Take 1 tablet (600 mg total) by mouth 3 (three) times daily. Patient not taking: Reported on 01/18/2017 06/06/16   Charm Rings, MD  medroxyPROGESTERone (DEPO-PROVERA) 150 MG/ML injection Inject 150 mg into the muscle every 3 (three) months.    [provider]  sulfamethoxazole-trimethoprim (BACTRIM DS,SEPTRA DS) 800-160 MG tablet Take 1 tablet by mouth 2 (two) times daily for 3 days. 12/04/17 12/07/17  Eyvonne Mechanic, PA-C    Family History Family History  Problem Relation Age of Onset  . Other Neg Hx   . Diabetes Neg Hx   . Heart disease Neg Hx   . Hyperlipidemia Neg Hx   . Hypertension Neg Hx   . Stroke Neg Hx  Social History Social History   Tobacco Use  . Smoking status: Never Smoker  . Smokeless tobacco: Never Used  Substance Use Topics  . Alcohol use: No    Alcohol/week: 0.6 oz    Types: 1 Glasses of wine per week  . Drug use: No     Allergies   Patient has no known allergies.   Review of Systems Review of Systems  All other systems reviewed and are negative.    Physical Exam Updated Vital Signs BP 129/87   Pulse 72   Temp 98.4 F (36.9 C)   Resp 18   Ht 5\' 8"  (1.727 m)   Wt 79.4 kg (175 lb)   SpO2 100%   BMI 26.61 kg/m   Physical Exam  Constitutional: She is oriented to person, place, and time. She appears well-developed  and well-nourished.  HENT:  Head: Normocephalic and atraumatic.  Eyes: Conjunctivae are normal. Pupils are equal, round, and reactive to light. Right eye exhibits no discharge. Left eye exhibits no discharge. No scleral icterus.  Neck: Normal range of motion. No JVD present. No tracheal deviation present.  Pulmonary/Chest: Effort normal. No stridor.  Musculoskeletal:  Induration and swelling noted to the left axilla approximately 2 cm-drainage or surrounding redness  Neurological: She is alert and oriented to person, place, and time. Coordination normal.  Psychiatric: She has a normal mood and affect. Her behavior is normal. Judgment and thought content normal.  Nursing note and vitals reviewed.    ED Treatments / Results  Labs (all labs ordered are listed, but only abnormal results are displayed) Labs Reviewed - No data to display  EKG  EKG Interpretation None       Radiology No results found.  Procedures Procedures (including critical care time)  INCISION AND DRAINAGE Performed by: Kelle Darting Aisling Emigh Consent: Verbal consent obtained. Risks and benefits: risks, benefits and alternatives were discussed Type: abscess  Body area: Axilla left  Anesthesia: local infiltration  Incision was made with a scalpel.  Local anesthetic: lidocaine lidocaine 2% %   Anesthetic total: 4 ml  Complexity: complex Blunt dissection to break up loculations  Drainage: purulent  Drainage amount: Minimal  Packing material: 1/4 in iodoform gauze  Patient tolerance: Patient tolerated the procedure well with no immediate complications.    Medications Ordered in ED Medications  lidocaine (XYLOCAINE) 2 % (with pres) injection 400 mg (400 mg Infiltration Given 12/04/17 2125)  ibuprofen (ADVIL,MOTRIN) tablet 600 mg (600 mg Oral Given 12/04/17 2126)     Initial Impression / Assessment and Plan / ED Course  I have reviewed the triage vital signs and the nursing notes.  Pertinent  labs & imaging results that were available during my care of the patient were reviewed by me and considered in my medical decision making (see chart for details).      Final Clinical Impressions(s) / ED Diagnoses   Final diagnoses:  Abscess   Labs:   Imaging:  Consults:  Therapeutics:  Discharge Meds:   Assessment/Plan: 31 year old female presents today with abscess.  I&D with bloody discharge.  She has no surrounding redness or signs of infection.  Patients robotics were extended for another 3 days, wound packed, wound care instructions given with strict return precautions.  She verbalized understanding and agreement to today's plan.      ED Discharge Orders        Ordered    sulfamethoxazole-trimethoprim (BACTRIM DS,SEPTRA DS) 800-160 MG tablet  2 times daily     12/04/17  2118       Eyvonne MechanicHedges, Jacaden Forbush, PA-C 12/04/17 2133    Lavera GuiseLiu, Dana Duo, MD 12/04/17 [phone]

## 2017-12-04 NOTE — Discharge Instructions (Signed)
Please read attached information. If you experience any new or worsening signs or symptoms please return to the emergency room for evaluation. Please follow-up with your primary care provider or specialist as discussed. Please use medication prescribed only as directed and discontinue taking if you have any concerning signs or symptoms.   °

## 2017-12-04 NOTE — ED Triage Notes (Signed)
Abscess drained under her lt arm dec 5 th at [place]  She still has swelling and she has purulent looking drainage from the bandage that she had on it  It is also swollen  lmp  On depo

## 2017-12-06 ENCOUNTER — Other Ambulatory Visit: Payer: Self-pay

## 2017-12-06 ENCOUNTER — Emergency Department (HOSPITAL_COMMUNITY)
Admission: EM | Admit: 2017-12-06 | Discharge: 2017-12-06 | Disposition: A | Discharge status: Home / Self Care | Payer: Medicaid Other | Attending: Emergency Medicine | Admitting: Emergency Medicine

## 2017-12-06 ENCOUNTER — Encounter (HOSPITAL_COMMUNITY): Payer: Self-pay

## 2017-12-06 DIAGNOSIS — M79622 Pain in left upper arm: Secondary | ICD-10-CM | POA: Insufficient documentation

## 2017-12-06 DIAGNOSIS — Z79899 Other long term (current) drug therapy: Secondary | ICD-10-CM | POA: Insufficient documentation

## 2017-12-06 DIAGNOSIS — Z5189 Encounter for other specified aftercare: Secondary | ICD-10-CM

## 2017-12-06 DIAGNOSIS — Z9049 Acquired absence of other specified parts of digestive tract: Secondary | ICD-10-CM | POA: Insufficient documentation

## 2017-12-06 MED ORDER — HYDROCODONE-ACETAMINOPHEN 5-325 MG PO TABS: 2.0000 | ORAL_TABLET | ORAL | 0 refills | Status: DC | PRN

## 2017-12-06 NOTE — Discharge Instructions (Signed)
Please continue taking the antibiotic as prescribed.  I have written you a prescription for Norco.  This medicine can make you drowsy so please do not drive, drink alcohol or work while taking it.  Gently wash the left armpit with warm soapy water daily and change the dressing every day.  Do not use deodorant or shave the armpit until the wound is completely closed.  I have listed the information to [facility] below, they accept patients who do not have insurance.  They are located across the street from [facility].  Please call make an appointment for recheck of your incision in a week.  Please return to the emergency department if you have any signs of infection including fever greater than 100.4 F, worsening redness and swelling over your left armpit.  Please also return for any new or worsening symptoms.

## 2017-12-06 NOTE — ED Provider Notes (Signed)
[facility] EMERGENCY DEPARTMENT Provider Note   CSN: [id number] Arrival date & time: 12/06/17  1024     History   Chief Complaint Chief Complaint  Patient presents with  . Abscess    HPI Katherine Lindsey is a 31 y.o. female.     Katherine Lindsey is a 31 year old female with no significant past medical history who presents to the emergency department for pain in her left axillary region.  Patient states that she had an abscess in her left axilla which was drained at 12/5, she then had recurrence of the abscess which was drained 2 days ago.  She has been taking Bactrim as prescribed.  Reports that she has had bloody drainage from the left armpit, no purulence.  She denies new redness, erythema or fever.  States that she has stinging 10/10 left axillary pain which is worsened when she moves the left arm.  States that she has been taking ibuprofen without significant relief of her pain.  Denies nausea vomiting, abdominal pain, fever, chills.  Past Medical History:  Diagnosis Date  . Crohn's disease in remission [facility])    Hasn't had symptoms since 2004  . Gallstones     Patient Active Problem List   Diagnosis Date Noted  . High risk HPV infection 02/07/2017  . VBAC, delivered 03/19/2016  . History of low transverse cesarean section 11/28/2015  . Hammertoe 09/21/2014    Past Surgical History:  Procedure Laterality Date  . CESAREAN SECTION N/A 06/16/2013   Procedure: CESAREAN SECTION;  Surgeon: Reva Bores, MD;  Location: [facility];  Service: Obstetrics;  Laterality: N/A;  . CHOLECYSTECTOMY  08/28/2012   Procedure: LAPAROSCOPIC CHOLECYSTECTOMY WITH INTRAOPERATIVE CHOLANGIOGRAM;  Surgeon: [name], DO;  Location: [facility] OR;  Service: General;  Laterality: N/A;  . INDUCED ABORTION  02/2012    OB History    Gravida Para Term Preterm AB Living   4 3 3   1 3    SAB TAB Ectopic Multiple Live Births   0 1   0 3      Obstetric Comments   2014 LTCS with extension  of hysterotomy inferiorly       Home Medications    Prior to Admission medications   Medication Sig Start Date End Date Taking? Authorizing Provider  acetaminophen (TYLENOL) 325 MG tablet Take 2 tablets (650 mg total) by mouth every 4 (four) hours as needed (for pain scale < 4). Patient not taking: Reported on 01/18/2017 03/19/16   Wouk, Wilfred Curtis, MD  benzonatate (TESSALON) 100 MG capsule Take 1 capsule (100 mg total) by mouth every 8 (eight) hours. Patient not taking: Reported on 02/05/2017 01/18/17   Lorre Nick, MD  HYDROcodone-acetaminophen (NORCO/VICODIN) 5-325 MG tablet Take 2 tablets by mouth every 4 (four) hours as needed. 11/18/17   Elson Areas, PA-C  ibuprofen (ADVIL,MOTRIN) 200 MG tablet Take 400 mg by mouth every 6 (six) hours as needed for fever or mild pain.    [provider]  ibuprofen (ADVIL,MOTRIN) 600 MG tablet Take 1 tablet (600 mg total) by mouth 3 (three) times daily. Patient not taking: Reported on 01/18/2017 06/06/16   Charm Rings, MD  medroxyPROGESTERone (DEPO-PROVERA) 150 MG/ML injection Inject 150 mg into the muscle every 3 (three) months.    [provider]  sulfamethoxazole-trimethoprim (BACTRIM DS,SEPTRA DS) 800-160 MG tablet Take 1 tablet by mouth 2 (two) times daily for 3 days. 12/04/17 12/07/17  Eyvonne Mechanic, PA-C    Family History Family  History  Problem Relation Age of Onset  . Other Neg Hx   . Diabetes Neg Hx   . Heart disease Neg Hx   . Hyperlipidemia Neg Hx   . Hypertension Neg Hx   . Stroke Neg Hx     Social History Social History   Tobacco Use  . Smoking status: Never Smoker  . Smokeless tobacco: Never Used  Substance Use Topics  . Alcohol use: No    Alcohol/week: 0.6 oz    Types: 1 Glasses of wine per week  . Drug use: No     Allergies   Patient has no known allergies.   Review of Systems Review of Systems  Constitutional: Negative for chills, fatigue and fever.  Gastrointestinal: Negative for  abdominal pain, nausea and vomiting.  Skin: Positive for wound (left axillary incision). Negative for color change.     Physical Exam Updated Vital Signs BP 132/84 (BP Location: Right Arm)   Pulse (!) 57   Temp 99 F (37.2 C) (Oral)   Resp 16   Ht 5\' 8"  (1.727 m)   Wt 78.9 kg (174 lb)   SpO2 100%   BMI 26.46 kg/m   Physical Exam  Constitutional: She is oriented to person, place, and time. She appears well-developed and well-nourished. No distress.  HENT:  Head: Normocephalic and atraumatic.  Eyes: Right eye exhibits no discharge. Left eye exhibits no discharge.  Pulmonary/Chest: Effort normal. No respiratory distress.  Neurological: She is alert and oriented to person, place, and time. Coordination normal.  Skin: Skin is warm and dry. Capillary refill takes less than 2 seconds. She is not diaphoretic.  Approximately 1.5 cm straight incision in the left axilla.  She does have surrounding pain to palpation.  No surrounding erythema, induration or warmth.  No area of fluctuance.  No active drainage.  Psychiatric: She has a normal mood and affect. Her behavior is normal.  Nursing note and vitals reviewed.    ED Treatments / Results  Labs (all labs ordered are listed, but only abnormal results are displayed) Labs Reviewed - No data to display  EKG  EKG Interpretation None       Radiology No results found.  Procedures Procedures (including critical care time)  EMERGENCY DEPARTMENT US SOFT TISSUE INTERPRETATION "Study: Limited Soft Tissue Ultrasound"  INDICATIONS: Pain Multiple views of the body part were obtained in real-time with a multi-frequency linear probe  PERFORMED BY: Myself IMAGES ARCHIVED?: Yes SIDE:Left BODY PART:Axilla INTERPRETATION:  No abcess noted    Medications Ordered in ED Medications - No data to display   Initial Impression / Assessment and Plan / ED Course  I have reviewed the triage vital signs and the nursing notes.  Pertinent  labs & imaging results that were available during my care of the patient were reviewed by me and considered in my medical decision making (see chart for details).     Patient presents with ongoing pain from her I&D site 2 days ago in the left axilla.  Packing removed.  On exam no erythema, warmth, fluctuance.  No signs of infection.  She is afebrile, nontoxic-appearing and vital signs stable.  Soft tissue ultrasound performed in the exam room without evidence of new abscess.  Patient has been informed to continue taking Bactrim as prescribed to her 2 days ago.  Discussed wound management including gentle soaks with warm soapy water daily.  She has been counseled not to wear deodorant or shaving until the wound is completely closed.  Discussed what to look out for in terms of infection and patient has been counseled on return precautions.  Will give her short-term pain management, [facility].  She agrees and voiced understanding to this plan.   Final Clinical Impressions(s) / ED Diagnoses   Final diagnoses:  Encounter for wound re-check    ED Discharge Orders        Ordered    HYDROcodone-acetaminophen (NORCO/VICODIN) 5-325 MG tablet  Every 4 hours PRN     12/06/17 1156        Lawrence MarseillesShrosbree, Kailani Brass J, PA-C 12/06/17 1159    Azalia Bilisampos, Kevin, MD 12/06/17 2251

## 2017-12-06 NOTE — ED Triage Notes (Signed)
Pt states she was seen at [facility] for an abscess in the left axilla on 12/5. Pt states she had the abscess drained completely on Wednesday. She states she is here today for pain control because she was told to take ibuprofen but has not had relief.

## 2017-12-18 ENCOUNTER — Ambulatory Visit: Payer: Medicaid Other

## 2017-12-20 ENCOUNTER — Ambulatory Visit: Payer: Medicaid Other

## 2017-12-24 ENCOUNTER — Ambulatory Visit: Payer: Medicaid Other

## 2017-12-25 ENCOUNTER — Ambulatory Visit (INDEPENDENT_AMBULATORY_CARE_PROVIDER_SITE_OTHER): Payer: Medicaid Other

## 2017-12-25 VITALS — BP 134/75 | HR 67

## 2017-12-25 DIAGNOSIS — Z3042 Encounter for surveillance of injectable contraceptive: Secondary | ICD-10-CM | POA: Diagnosis not present

## 2017-12-25 NOTE — Progress Notes (Signed)
Depo given,advised of time frame of next visit 3/27-04/10.Pt verbalized understanding.

## 2017-12-25 NOTE — Progress Notes (Signed)
Chart reviewed for nurse visit. Agree with plan of care.   Sharyon Cable, CNM 12/25/2017 1:15 PM

## 2018-02-21 ENCOUNTER — Encounter: Payer: Self-pay | Admitting: *Deleted

## 2018-03-13 ENCOUNTER — Ambulatory Visit: Payer: Medicaid Other

## 2018-03-14 ENCOUNTER — Ambulatory Visit: Payer: Medicaid Other | Admitting: *Deleted

## 2018-03-14 DIAGNOSIS — Z3042 Encounter for surveillance of injectable contraceptive: Secondary | ICD-10-CM

## 2018-03-14 NOTE — Progress Notes (Signed)
Agree with A & P. 

## 2018-03-14 NOTE — Progress Notes (Signed)
Patient presented to clinic for depoprovera injection. Per her records she needed to have a pap smear last month d/t high risk HPV. I informed the patient that I am unable to give her the injection today because she needs to see a doctor for a pap smear and exam in order for Medicaid to pay for it. She has until Apr 10 to get her shot before it will be late. Assisted patient with setting up and appointment for her annual and depo shot. She is scheduled for Apr 10 @ 2:55. Patient voiced understanding of the necessity of seeing the doctor first.

## 2018-03-26 ENCOUNTER — Other Ambulatory Visit (HOSPITAL_COMMUNITY)
Admission: RE | Admit: 2018-03-26 | Discharge: 2018-03-26 | Disposition: A | Discharge status: Home / Self Care | Payer: Medicaid Other | Source: Ambulatory Visit | Attending: Student | Admitting: Student

## 2018-03-26 ENCOUNTER — Ambulatory Visit (INDEPENDENT_AMBULATORY_CARE_PROVIDER_SITE_OTHER): Payer: Medicaid Other

## 2018-03-26 VITALS — BP 130/76 | HR 57 | Ht 68.0 in | Wt 179.6 lb

## 2018-03-26 DIAGNOSIS — R87810 Cervical high risk human papillomavirus (HPV) DNA test positive: Secondary | ICD-10-CM | POA: Diagnosis not present

## 2018-03-26 DIAGNOSIS — Z3042 Encounter for surveillance of injectable contraceptive: Secondary | ICD-10-CM

## 2018-03-26 DIAGNOSIS — Z01419 Encounter for gynecological examination (general) (routine) without abnormal findings: Secondary | ICD-10-CM | POA: Insufficient documentation

## 2018-03-26 DIAGNOSIS — Z309 Encounter for contraceptive management, unspecified: Secondary | ICD-10-CM

## 2018-03-26 MED ORDER — MEDROXYPROGESTERONE ACETATE 150 MG/ML IM SUSP
150.0000 mg | Freq: Once | INTRAMUSCULAR | Status: AC
  Administered 2018-03-26: 150 mg via INTRAMUSCULAR

## 2018-03-26 NOTE — Progress Notes (Signed)
History:  [name] is a 32 y.o. [phone] who presents to clinic today for an annual exam with pap smear and Depo injection. She denies any complaints. She is requesting STD testing.  Her last pap was  01/2017 and positive HPV 18. Patient states she was unable to follow up for colposcopy.  The following portions of the patient's history were reviewed and updated as appropriate: allergies, current medications, family history, past medical history, social history, past surgical history and problem list.  Review of Systems:  Review of Systems  Constitutional: Negative.  Negative for chills and fever.  Respiratory: Negative.   Cardiovascular: Negative.  Negative for chest pain.  Genitourinary: Negative.   Neurological: Negative.  Negative for dizziness and headaches.      Objective:  Physical Exam BP 130/76   Pulse (!) 57   Ht 5\' 8"  (1.727 m)   Wt 179 lb 9.6 oz (81.5 kg)   BMI 27.31 kg/m  Physical Exam  Constitutional: She is oriented to person, place, and time. She appears well-developed and well-nourished. No distress.  HENT:  Head: Normocephalic.  Eyes: Pupils are equal, round, and reactive to light.  Neck: Normal range of motion.  Cardiovascular: Normal rate and regular rhythm.  Pulmonary/Chest: Effort normal and breath sounds normal. No respiratory distress.  Abdominal: Soft. There is no tenderness.  Genitourinary:  Genitourinary Comments: Pelvic exam: Cervix pink, visually closed, without lesion, scant white creamy discharge, vaginal walls and external genitalia normal Bimanual exam: Cervix 0/long/high, firm, anterior, neg CMT, uterus nontender, nonenlarged, adnexa without tenderness, enlargement, or mass  Musculoskeletal: Normal range of motion.  Neurological: She is alert and oriented to person, place, and time.  Skin: Skin is warm and dry.  Psychiatric: She has a normal mood and affect. Her behavior is normal. Judgment and thought content normal.  Nursing  note and vitals reviewed.   Assessment & Plan:  1. Well woman exam - Pap repeated today. Emphasized importance of following ASCCP guidelines with pap results.  - HIV, RPR, Hep B, Hep C  2. Encounter for management and injection of injectable progestin contraceptive - Depo today   Rolm Bookbinder, [place] 03/26/2018 3:40 PM

## 2018-03-27 ENCOUNTER — Ambulatory Visit: Payer: Medicaid Other | Admitting: Student

## 2018-03-27 LAB — HIV ANTIBODY (ROUTINE TESTING W REFLEX): HIV Screen 4th Generation wRfx: NONREACTIVE

## 2018-03-27 LAB — RPR: RPR Ser Ql: NONREACTIVE

## 2018-03-27 LAB — HEPATITIS B SURFACE ANTIGEN: HEP B S AG: NEGATIVE

## 2018-03-27 LAB — HEPATITIS C ANTIBODY

## 2018-03-28 LAB — CYTOLOGY - PAP
Chlamydia: NEGATIVE
Diagnosis: NEGATIVE
HPV: DETECTED — AB
Neisseria Gonorrhea: NEGATIVE

## 2018-04-08 ENCOUNTER — Encounter: Payer: Self-pay | Admitting: General Practice

## 2018-04-08 ENCOUNTER — Telehealth: Payer: Self-pay | Admitting: General Practice

## 2018-04-08 NOTE — Telephone Encounter (Signed)
Patient need to have Colpo per Cleone Slim, CNM.  Patient will need to see BCCCP first, d/t patient only having [place] Medicaid.  Called patient to explain, but no answer and was unable to leave message on VM d/t VM not set up.  Message was sent to [facility].

## 2018-04-23 ENCOUNTER — Telehealth: Payer: Self-pay | Admitting: General Practice

## 2018-04-23 ENCOUNTER — Encounter: Payer: Self-pay | Admitting: General Practice

## 2018-04-23 NOTE — Telephone Encounter (Signed)
Called patient to schedule Colpo on 05/15/18 at 10:35am with Dr. Marice Potter.  Mailed patient Manufacturing engineer.  Patient voiced understanding.

## 2018-05-15 ENCOUNTER — Ambulatory Visit: Payer: Medicaid Other | Admitting: Obstetrics & Gynecology

## 2018-05-15 DIAGNOSIS — B977 Papillomavirus as the cause of diseases classified elsewhere: Secondary | ICD-10-CM

## 2018-05-15 NOTE — Progress Notes (Signed)
Pt came in today for colposcopy.  Per Dr. Marice Potter, pt does not need a colpo and will need to schedule for pap smear in one year with co-testing.   Informed pt of providers recommendation.  I also explained to the pt how HPV is contacted and what the virus is.  Pt given information on HPV vaccine.  Pt continued to have questions about HPV and why she did not need to have a colposcopy.  Dr. Marice Potter came and spoke with the pt.  Pt did not have any further concerns.

## 2018-05-15 NOTE — Progress Notes (Signed)
   Subjective:    Patient ID: Katherine Lindsey, female    DOB: [birth year], 32 y.o.   MRN: [id number]  HPI 32 yo single P3 (2, 4, and 43 yo kids) here to discuss her pap smears. Her last 2 pap smears showed HR HPV but negative for dysplasia  Review of Systems She uses depo provera for contraception, no periods    Objective:   Physical Exam Breathing, conversing, and ambulating normally        Assessment & Plan:  Negative pap x 2 years with + HR HPV Rec annual paps q year with cotesting Rec Gardasil at [employer]

## 2018-06-17 ENCOUNTER — Ambulatory Visit (INDEPENDENT_AMBULATORY_CARE_PROVIDER_SITE_OTHER): Payer: Medicaid Other

## 2018-06-17 ENCOUNTER — Ambulatory Visit: Payer: Medicaid Other

## 2018-06-17 VITALS — Wt 175.2 lb

## 2018-06-17 DIAGNOSIS — Z3042 Encounter for surveillance of injectable contraceptive: Secondary | ICD-10-CM | POA: Diagnosis not present

## 2018-06-17 MED ORDER — MEDROXYPROGESTERONE ACETATE 150 MG/ML IM SUSP
150.0000 mg | Freq: Once | INTRAMUSCULAR | Status: AC
  Administered 2018-06-17: 150 mg via INTRAMUSCULAR

## 2018-06-17 NOTE — Progress Notes (Signed)
   Katherine Lindsey here for Depo-Provera  Injection.  Injection administered without complication. Patient will return in 3 months for next injection.  [name], CMA 06/17/2018  4:20 PM

## 2018-07-20 ENCOUNTER — Other Ambulatory Visit: Payer: Self-pay

## 2018-07-20 ENCOUNTER — Encounter (HOSPITAL_COMMUNITY): Payer: Self-pay | Admitting: Emergency Medicine

## 2018-07-20 ENCOUNTER — Emergency Department (HOSPITAL_COMMUNITY)
Admission: EM | Admit: 2018-07-20 | Discharge: 2018-07-20 | Disposition: A | Discharge status: Home / Self Care | Payer: Medicaid Other | Attending: Emergency Medicine | Admitting: Emergency Medicine

## 2018-07-20 DIAGNOSIS — I10 Essential (primary) hypertension: Secondary | ICD-10-CM | POA: Insufficient documentation

## 2018-07-20 DIAGNOSIS — L02412 Cutaneous abscess of left axilla: Secondary | ICD-10-CM | POA: Insufficient documentation

## 2018-07-20 DIAGNOSIS — L0291 Cutaneous abscess, unspecified: Secondary | ICD-10-CM

## 2018-07-20 MED ORDER — HYDROCODONE-ACETAMINOPHEN 5-325 MG PO TABS: 1.0000 | ORAL_TABLET | Freq: Four times a day (QID) | ORAL | 0 refills | Status: DC | PRN

## 2018-07-20 MED ORDER — SULFAMETHOXAZOLE-TRIMETHOPRIM 800-160 MG PO TABS: 1.0000 | ORAL_TABLET | Freq: Two times a day (BID) | ORAL | 0 refills | Status: AC

## 2018-07-20 MED ORDER — LIDOCAINE-EPINEPHRINE 2 %-1:200000 IJ SOLN
10.0000 mL | Freq: Once | INTRAMUSCULAR | Status: AC
Start: 2018-07-20 — End: 2018-07-20
  Administered 2018-07-20: 10 mL
  Filled 2018-07-20: qty 20

## 2018-07-20 NOTE — ED Provider Notes (Signed)
[facility]-EMERGENCY DEPT Provider Note   CSN: [id number] Arrival date & time: 07/20/18  1757     History   Chief Complaint Chief Complaint  Patient presents with  . Abscess    HPI Katherine Lindsey is a 32 y.o. female with a past medical history of Crohn's disease in remission, who presents today for evaluation of an abscess.  She reports that this is the third time she has had a boil in this left axilla.  She reports that she first noticed symptoms approximately 2 weeks ago and has been attempting to treated at home with warm showers, however it is not improving.  She denies any fevers or chills.   HPI  Past Medical History:  Diagnosis Date  . Crohn's disease in remission [facility])    Hasn't had symptoms since 2004  . Gallstones     Patient Active Problem List   Diagnosis Date Noted  . High risk HPV infection 02/07/2017  . VBAC, delivered 03/19/2016  . History of low transverse cesarean section 11/28/2015  . Hammertoe 09/21/2014    Past Surgical History:  Procedure Laterality Date  . CESAREAN SECTION N/A 06/16/2013   Procedure: CESAREAN SECTION;  Surgeon: Reva Bores, MD;  Location: [facility];  Service: Obstetrics;  Laterality: N/A;  . CHOLECYSTECTOMY  08/28/2012   Procedure: LAPAROSCOPIC CHOLECYSTECTOMY WITH INTRAOPERATIVE CHOLANGIOGRAM;  Surgeon: [name], DO;  Location: [facility] OR;  Service: General;  Laterality: N/A;  . INDUCED ABORTION  02/2012     OB History    Gravida  4   Para  3   Term  3   Preterm      AB  1   Living  3     SAB  0   TAB  1   Ectopic      Multiple  0   Live Births  3        Obstetric Comments  2014 LTCS with extension of hysterotomy inferiorly         Home Medications    Prior to Admission medications   Medication Sig Start Date End Date Taking? Authorizing Provider  HYDROcodone-acetaminophen (NORCO/VICODIN) 5-325 MG tablet Take 1 tablet by mouth every 6 (six) hours as needed for severe pain.  07/20/18   Cristina Gong, PA-C  medroxyPROGESTERone (DEPO-PROVERA) 150 MG/ML injection Inject 150 mg into the muscle every 3 (three) months.    [provider]  sulfamethoxazole-trimethoprim (BACTRIM DS,SEPTRA DS) 800-160 MG tablet Take 1 tablet by mouth 2 (two) times daily for 7 days. 07/20/18 07/27/18  Cristina Gong, PA-C    Family History Family History  Problem Relation Age of Onset  . Other Neg Hx   . Diabetes Neg Hx   . Heart disease Neg Hx   . Hyperlipidemia Neg Hx   . Hypertension Neg Hx   . Stroke Neg Hx     Social History Social History   Tobacco Use  . Smoking status: Never Smoker  . Smokeless tobacco: Never Used  Substance Use Topics  . Alcohol use: No    Alcohol/week: 0.6 oz    Types: 1 Glasses of wine per week  . Drug use: No     Allergies   Patient has no known allergies.   Review of Systems Review of Systems  Constitutional: Negative for chills and fever.  Skin: Positive for wound.  Neurological: Negative for weakness.  All other systems reviewed and are negative.    Physical Exam Updated Vital  Signs BP 134/78 (BP Location: Right Arm)   Pulse 61   Temp 98.6 F (37 C) (Oral)   Resp 16   Ht 5\' 8"  (1.727 m)   Wt 79.4 kg (175 lb)   SpO2 100%   BMI 26.61 kg/m   Physical Exam  Constitutional: She appears well-developed. No distress.  HENT:  Head: Normocephalic and atraumatic.  Musculoskeletal:  Free movement of left upper extremity.  Neurological: She is alert.  Skin: Skin is warm and dry. She is not diaphoretic.  There is a 2 cm area of fluctuance with surrounding induration in the left axilla.  There is a wound from which there is scant material draining.  There is a nearby subcutaneous to centimeter square area of fluctuance and induration.  Psychiatric: She has a normal mood and affect.  Nursing note and vitals reviewed.    ED Treatments / Results  Labs (all labs ordered are listed, but only abnormal results are  displayed) Labs Reviewed - No data to display  EKG None  Radiology No results found.  Procedures .Marland KitchenIncision and Drainage Performed by: Cristina Gong, PA-C Authorized by: Cristina Gong, PA-C   Consent:    Consent obtained:  Verbal   Consent given by:  Patient   Risks discussed:  Bleeding, incomplete drainage, pain and infection (Damage to other structures, need for additional procedures)   Alternatives discussed:  No treatment, alternative treatment and referral Location:    Type:  Abscess   Size:  2x3cm   Location:  Trunk   Trunk location: Left axilla. Pre-procedure details:    Skin preparation:  Chloraprep Anesthesia (see MAR for exact dosages):    Anesthesia method:  Local infiltration   Local anesthetic:  Lidocaine 2% WITH epi Procedure type:    Complexity:  Complex Procedure details:    Incision types:  Stab incision   Incision depth:  Subcutaneous   Scalpel blade:  11   Wound management:  Probed and deloculated and irrigated with saline   Drainage:  Bloody and purulent   Drainage amount:  Scant   Packing materials:  None Post-procedure details:    Patient tolerance of procedure:  Tolerated with difficulty Comments:     Patient was extremely anxious, however tolerated the procedure with redirection. Marland Kitchen.Incision and Drainage Performed by: Cristina Gong, PA-C Authorized by: Cristina Gong, PA-C   Consent:    Consent obtained:  Verbal   Consent given by:  Patient   Risks discussed:  Bleeding, incomplete drainage, pain and infection (Damage to other structures, need for additional procedures)   Alternatives discussed:  No treatment, alternative treatment and referral Location:    Type:  Abscess   Size:  1 cm x 1 cm   Location: Left axilla. Pre-procedure details:    Skin preparation:  Chloraprep Anesthesia (see MAR for exact dosages):    Anesthesia method:  Local infiltration   Local anesthetic:  Lidocaine 2% WITH epi Procedure  type:    Complexity:  Complex Procedure details:    Incision types:  Stab incision   Incision depth:  Subcutaneous   Scalpel blade:  11   Wound management:  Probed and deloculated and irrigated with saline   Drainage:  Bloody and purulent   Drainage amount:  Scant   Packing materials:  None Post-procedure details:    Patient tolerance of procedure:  Tolerated with difficulty Comments:     Patient's anxiety caused difficulties in tolerating the procedure, she required verbal coaching and multiple instances  of redirection.   (including critical care time)  Medications Ordered in ED Medications  lidocaine-EPINEPHrine (XYLOCAINE W/EPI) 2 %-1:200000 (PF) injection 10 mL (10 mLs Infiltration Given by Other 07/20/18 1903)     Initial Impression / Assessment and Plan / ED Course  I have reviewed the triage vital signs and the nursing notes.  Pertinent labs & imaging results that were available during my care of the patient were reviewed by me and considered in my medical decision making (see chart for details).     Patient with skin abscess amenable to incision and drainage.  Abscess was not large enough to warrant packing or drain,  wound recheck in 2 days. Encouraged home warm soaks and flushing.  Mild signs of cellulitis is surrounding skin.    OTC home medication, along with antibiotics.  Discussed with patient that as this is a recurrent problem she should obtain general surgery follow-up for outpatient evaluation.  Return precautions were discussed with patient who states their understanding.  At the time of discharge patient denied any unaddressed complaints or concerns.  Patient is agreeable for discharge home.   Final Clinical Impressions(s) / ED Diagnoses   Final diagnoses:  Abscess  Hypertension, unspecified type    ED Discharge Orders        Ordered    HYDROcodone-acetaminophen (NORCO/VICODIN) 5-325 MG tablet  Every 6 hours PRN     07/20/18 1940     sulfamethoxazole-trimethoprim (BACTRIM DS,SEPTRA DS) 800-160 MG tablet  2 times daily     07/20/18 1940       Norman Clay 07/20/18 2253    Shaune Pollack, MD 07/20/18 [phone]

## 2018-07-20 NOTE — Discharge Instructions (Addendum)
While in the ED your blood pressure was high.  Please follow up with your primary care doctor or the wellness clinic for repeat evaluation as you may need medication.  High blood pressure can cause long term, potentially serious, damage if left untreated.   You may have diarrhea from the antibiotics.  It is very important that you continue to take the antibiotics even if you get diarrhea unless a medical professional tells you that you may stop taking them.  If you stop too early the bacteria you are being treated for will become stronger and you may need different, more powerful antibiotics that have more side effects and worsening diarrhea.  Please stay well hydrated and consider probiotics as they may decrease the severity of your diarrhea.  Please be aware that if you take any hormonal contraception (birth control pills, nexplanon, the ring, etc) that your birth control will not work while you are taking antibiotics and you need to use back up protection as directed on the birth control medication information insert.   Please take Ibuprofen (Advil, motrin) and Tylenol (acetaminophen) to relieve your pain.  You may take up to 600 MG (3 pills) of normal strength ibuprofen every 8 hours as needed.  In between doses of ibuprofen you make take tylenol, up to 1,000 mg (two extra strength pills).  Do not take more than 3,000 mg tylenol in a 24 hour period.  Please check all medication labels as many medications such as pain and cold medications may contain tylenol.  Do not drink alcohol while taking these medications.  Do not take other NSAID'S while taking ibuprofen (such as aleve or naproxen).  Please take ibuprofen with food to decrease stomach upset.

## 2018-07-20 NOTE — ED Triage Notes (Signed)
Patient c/o recurrent abscess to left axilla x2 weeks.

## 2018-08-28 ENCOUNTER — Encounter (HOSPITAL_COMMUNITY): Payer: Self-pay

## 2018-08-28 ENCOUNTER — Emergency Department (HOSPITAL_COMMUNITY)
Admission: EM | Admit: 2018-08-28 | Discharge: 2018-08-28 | Disposition: A | Discharge status: Home / Self Care | Payer: Medicaid Other | Attending: Emergency Medicine | Admitting: Emergency Medicine

## 2018-08-28 ENCOUNTER — Other Ambulatory Visit: Payer: Self-pay

## 2018-08-28 DIAGNOSIS — R5383 Other fatigue: Secondary | ICD-10-CM | POA: Insufficient documentation

## 2018-08-28 LAB — URINALYSIS, ROUTINE W REFLEX MICROSCOPIC
BACTERIA UA: NONE SEEN
Bilirubin Urine: NEGATIVE
Glucose, UA: NEGATIVE mg/dL
Ketones, ur: NEGATIVE mg/dL
LEUKOCYTES UA: NEGATIVE
Nitrite: NEGATIVE
PROTEIN: NEGATIVE mg/dL
Specific Gravity, Urine: 1.002 — ABNORMAL LOW (ref 1.005–1.030)
pH: 8 (ref 5.0–8.0)

## 2018-08-28 LAB — CBC WITH DIFFERENTIAL/PLATELET
Abs Immature Granulocytes: 0 10*3/uL (ref 0.0–0.1)
BASOS ABS: 0 10*3/uL (ref 0.0–0.1)
BASOS PCT: 0 %
EOS ABS: 0.1 10*3/uL (ref 0.0–0.7)
EOS PCT: 1 %
HEMATOCRIT: 38.7 % (ref 36.0–46.0)
Hemoglobin: 12.5 g/dL (ref 12.0–15.0)
IMMATURE GRANULOCYTES: 0 %
LYMPHS ABS: 3.1 10*3/uL (ref 0.7–4.0)
Lymphocytes Relative: 33 %
MCH: 29.3 pg (ref 26.0–34.0)
MCHC: 32.3 g/dL (ref 30.0–36.0)
MCV: 90.6 fL (ref 78.0–100.0)
Monocytes Absolute: 0.8 10*3/uL (ref 0.1–1.0)
Monocytes Relative: 8 %
NEUTROS PCT: 58 %
Neutro Abs: 5.4 10*3/uL (ref 1.7–7.7)
PLATELETS: 293 10*3/uL (ref 150–400)
RBC: 4.27 MIL/uL (ref 3.87–5.11)
RDW: 12.5 % (ref 11.5–15.5)
WBC: 9.4 10*3/uL (ref 4.0–10.5)

## 2018-08-28 LAB — BASIC METABOLIC PANEL
ANION GAP: 9 (ref 5–15)
BUN: 6 mg/dL (ref 6–20)
CALCIUM: 9.3 mg/dL (ref 8.9–10.3)
CO2: 22 mmol/L (ref 22–32)
Chloride: 108 mmol/L (ref 98–111)
Creatinine, Ser: 0.84 mg/dL (ref 0.44–1.00)
Glucose, Bld: 90 mg/dL (ref 70–99)
Potassium: 4 mmol/L (ref 3.5–5.1)
Sodium: 139 mmol/L (ref 135–145)

## 2018-08-28 LAB — I-STAT BETA HCG BLOOD, ED (MC, WL, AP ONLY): I-stat hCG, quantitative: <5 m[IU]/mL (ref <5)

## 2018-08-28 NOTE — ED Provider Notes (Signed)
[facility] EMERGENCY DEPARTMENT Provider Note   CSN: [id number] Arrival date & time: 08/28/18  1625     History   Chief Complaint Chief Complaint  Patient presents with  . Weakness    HPI Katherine Lindsey is a 32 y.o. female.  HPI   32 year old female presents today with complaints of weakness.  Patient notes that over the last several days she has not felt like herself.  She notes that she has been more fatigued.  She notes waking up in the morning symptoms are improved.  She notes yesterday she did not eat till late in the evening, today was out running errands when she started to feel tingling in her bilateral hands and feet.  She notes she had weakness throughout, nonfocal.  She stopped that her brother's car dealership and drink some water which seemed to improve her symptoms.  She denies any focal neurological deficits, headache, dizziness, abdominal pain, chest pain shortness of breath cough or fever.  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  She also notes she has recurring abscesses to her axilla.  She notes small nodules in the right axilla presently, no redness swelling or discharge.   Past Medical History:  Diagnosis Date  . Crohn's disease in remission [facility])    Hasn't had symptoms since 2004  . Gallstones     Patient Active Problem List   Diagnosis Date Noted  . High risk HPV infection 02/07/2017  . VBAC, delivered 03/19/2016  . History of low transverse cesarean section 11/28/2015  . Hammertoe 09/21/2014    Past Surgical History:  Procedure Laterality Date  . CESAREAN SECTION N/A 06/16/2013   Procedure: CESAREAN SECTION;  Surgeon: Reva Bores, MD;  Location: [facility] OR[facility];  Service: Obstetrics;  Laterality: N/A;  . CHOLECYSTECTOMY  08/28/2012   Procedure: LAPAROSCOPIC CHOLECYSTECTOMY WITH INTRAOPERATIVE CHOLANGIOGRAM;  Surgeon: [name], DO;  Location: [facility] OR;  Service: General;  Laterality: N/A;  . INDUCED ABORTION  02/2012     OB History    Gravida  4   Para  3   Term  3   Preterm      AB  1   Living  3     SAB  0   TAB  1   Ectopic      Multiple  0   Live Births  3        Obstetric Comments  2014 LTCS with extension of hysterotomy inferiorly         Home Medications    Prior to Admission medications   Medication Sig Start Date End Date Taking? Authorizing Provider  HYDROcodone-acetaminophen (NORCO/VICODIN) 5-325 MG tablet Take 1 tablet by mouth every 6 (six) hours as needed for severe pain. 07/20/18   Cristina Gong, PA-C  medroxyPROGESTERone (DEPO-PROVERA) 150 MG/ML injection Inject 150 mg into the muscle every 3 (three) months.    [provider]    Family History Family History  Problem Relation Age of Onset  . Other Neg Hx   . Diabetes Neg Hx   . Heart disease Neg Hx   . Hyperlipidemia Neg Hx   . Hypertension Neg Hx   . Stroke Neg Hx     Social History Social History   Tobacco Use  . Smoking status: Never Smoker  . Smokeless tobacco: Never Used  Substance Use Topics  .  Alcohol use: Yes    Alcohol/week: 1.0 standard drinks    Types: 1 Glasses of wine per week  . Drug use: No     Allergies   Patient has no known allergies.   Review of Systems Review of Systems  All other systems reviewed and are negative.   Physical Exam Updated Vital Signs BP 132/75 (BP Location: Right Arm)   Pulse 72   Temp 98.1 F (36.7 C) (Oral)   Resp 18   Ht 5\' 8"  (1.727 m)   Wt 79.4 kg   SpO2 100%   BMI 26.61 kg/m   Physical Exam  Constitutional: She is oriented to person, place, and time. She appears well-developed and well-nourished.  HENT:  Head: Normocephalic and atraumatic.  Eyes: Pupils are equal, round, and reactive to light. Conjunctivae are normal. Right eye exhibits no discharge. Left eye exhibits no discharge. No scleral icterus.  Neck: Normal  range of motion. No JVD present. No tracheal deviation present.  Cardiovascular: Normal rate, regular rhythm, normal heart sounds and intact distal pulses. Exam reveals no gallop and no friction rub.  No murmur heard. Pulmonary/Chest: Effort normal and breath sounds normal. No stridor. No respiratory distress. She has no wheezes. She has no rales. She exhibits no tenderness.  Abdominal: Soft. She exhibits no distension and no mass. There is no tenderness. There is no rebound and no guarding. No hernia.  Neurological: She is alert and oriented to person, place, and time. She has normal strength. No cranial nerve deficit or sensory deficit. Coordination and gait normal. GCS eye subscore is 4. GCS verbal subscore is 5. GCS motor subscore is 6.  Psychiatric: She has a normal mood and affect. Her behavior is normal. Judgment and thought content normal.  Nursing note and vitals reviewed.    ED Treatments / Results  Labs (all labs ordered are listed, but only abnormal results are displayed) Labs Reviewed  URINALYSIS, ROUTINE W REFLEX MICROSCOPIC - Abnormal; Notable for the following components:      Result Value   Color, Urine COLORLESS (*)    Specific Gravity, Urine 1.002 (*)    Hgb urine dipstick MODERATE (*)    All other components within normal limits  CBC WITH DIFFERENTIAL/PLATELET  BASIC METABOLIC PANEL  I-STAT BETA HCG BLOOD, ED ([facility], [facility], [facility] ONLY)  CBG MONITORING, ED    EKG None  Radiology No results found.  Procedures Procedures (including critical care time)  Medications Ordered in ED Medications - No data to display   Initial Impression / Assessment and Plan / ED Course  I have reviewed the triage vital signs and the nursing notes.  Pertinent labs & imaging results that were available during my care of the patient were reviewed by me and considered in my medical decision making (see chart for details).      Labs: I stat beta, hcg, UA, CBC,  BMP  Imaging:  Consults:  Therapeutics:  Discharge Meds:   Assessment/Plan: 32 year old female presents today with very generalized complaints.  Patient noting fatigue.  She has no focal neurological deficits, reassuring laboratory analysis, no signs of infectious etiology.  Question fatigue secondary to full-time job 3 children at home is a single mother with very little rest.  Encourage patient to rest, hydrate, return with any concerning signs or symptoms and follow-up as an outpatient with primary care.  She verbalized understanding and agreement to today's plan had no further questions or concerns.  She also has recurring abscesses, she does not  have any drainable abscess, early stages presently.  Encouraged her to follow-up with general surgery.      Final Clinical Impressions(s) / ED Diagnoses   Final diagnoses:  Fatigue, unspecified type    ED Discharge Orders    None       Rosalio Loud 08/28/18 2209    Shaune Pollack, MD 08/30/18 [phone]

## 2018-08-28 NOTE — ED Provider Notes (Signed)
MSE was initiated and I personally evaluated the patient  5:05 PM on August 28, 2018.  Patient placed in Quick Look pathway, seen and evaluated   Chief Complaint: paresthesias  HPI:   Patient is a 32 yo female who presents with complaints of "feeling weird" with generalized weakness and paresthesias to the entire body including extremities x 4 since 1400 today. Patient states she had some chicken earlier today and had some sweet tea. She went to a high school event outside in the heat. She got into her car after the event and while driving she started to feel poorly at 1400, she is having difficulty further characterizing this to me, keeps stating "weird feeling". Reports paresthesias to the whole body, mostly to the extremities, lightheadedness like she might pass out, and weakness. Drank some water and now feeling a bit better overall, but not completely back to baseline.  ROS: + paresthesias, generalized weakness. - chest pain, dyspnea, syncope  Physical Exam:   Gen: No distress  Neuro: Awake and Alert  Skin: Warm    Focused Exam: CN III-XII grossly intact. Symmetric grip strength and lower extremity muscle group strength. Sensation grossly intact x 4. Negative pronator drift. Normal finger to nose. Heart RRR.   Initiation of care has begun. The patient has been counseled on the process, plan, and necessity for staying for the completion/evaluation, and the remainder of the medical screening examination. I discussed with patient  and if present family/friends the importance of alerting staff to any new or worsening symptoms or any other concerns throughout his/her ER visit.      Desmond Lope 08/28/18 1709    Benjiman Core, MD 08/29/18 [phone]

## 2018-08-28 NOTE — Discharge Instructions (Addendum)
Please read attached information. If you experience any new or worsening signs or symptoms please return to the emergency room for evaluation. Please follow-up with your primary care provider or specialist as discussed. Please use medication prescribed only as directed and discontinue taking if you have any concerning signs or symptoms.   °

## 2018-08-28 NOTE — ED Triage Notes (Signed)
Pt reports that she was outside today, did eat and drink, only sweet tea, reports that after she left the event she was at outside, she started "feeling weird and weak".

## 2018-08-28 NOTE — ED Notes (Signed)
Pt verbalizes understanding of d/c instructions. Pt ambulatory at d/c with all belongings.   

## 2018-09-03 ENCOUNTER — Ambulatory Visit (INDEPENDENT_AMBULATORY_CARE_PROVIDER_SITE_OTHER): Payer: Medicaid Other | Admitting: *Deleted

## 2018-09-03 VITALS — BP 135/74 | HR 64

## 2018-09-03 DIAGNOSIS — Z3042 Encounter for surveillance of injectable contraceptive: Secondary | ICD-10-CM | POA: Diagnosis not present

## 2018-09-03 MED ORDER — MEDROXYPROGESTERONE ACETATE 150 MG/ML IM SUSP
150.0000 mg | Freq: Once | INTRAMUSCULAR | Status: AC
  Administered 2018-09-03: 150 mg via INTRAMUSCULAR

## 2018-09-03 NOTE — Patient Instructions (Signed)
HPV Test The human papillomavirus (HPV) test is used to look for high-risk types of HPV infection. HPV is a group of about 100 viruses. Many of these viruses cause growths on, in, or around the genitals. Most HPV viruses cause infections that usually go away without treatment. However, HPV types 6, 11, 16, and 18 are considered high-risk types of HPV that can increase your risk of cancer of the cervix or anus if the infection is left untreated. An HPV test identifies the DNA (genetic) strands of the HPV infection, so it is also referred to as the HPV DNA test. Although HPV is found in both males and females, the HPV test is only used to screen for increased cancer risk in females:  With an abnormal Pap test.  After treatment of an abnormal Pap test.  Between the ages of 30 and 65.  After treatment of a high-risk HPV infection.  The HPV test may be done at the same time as a pelvic exam and Pap test in females over the age of 30. Both the HPV test and Pap test require a sample of cells from the cervix. How do I prepare for this test?  Do not douche or take a bath for 24-48 hours before the test or as directed by your health care provider.  Do not have sex for 24-48 hours before the test or as directed by your health care provider.  You may be asked to reschedule the test if you are menstruating.  You will be asked to urinate before the test. What do the results mean? It is your responsibility to obtain your test results. Ask the lab or department performing the test when and how you will get your results. Talk with your health care provider if you have any questions about your results. Your result will be negative or positive. Meaning of Negative Test Results A negative HPV test result means that no HPV was found, and it is very likely that you do not have HPV. Meaning of Positive Test Results A positive HPV test result indicates that you have HPV.  If your test result shows the presence  of any high-risk HPV strains, you may have an increased risk of developing cancer of the cervix or anus if the infection is left untreated.  If any low-risk HPV strains are found, you are not likely to have an increased risk of cancer.  Discuss your test results with your health care provider. He or she will use the results to make a diagnosis and determine a treatment plan that is right for you. Talk with your health care provider to discuss your results, treatment options, and if necessary, the need for more tests. Talk with your health care provider if you have any questions about your results. This information is not intended to replace advice given to you by your health care provider. Make sure you discuss any questions you have with your health care provider. Document Released: 12/28/2004 Document Revised: 08/08/2016 Document Reviewed: 04/20/2014 Elsevier Interactive Patient Education  2018 [employer]  

## 2018-11-19 ENCOUNTER — Ambulatory Visit (INDEPENDENT_AMBULATORY_CARE_PROVIDER_SITE_OTHER): Payer: Medicaid Other | Admitting: *Deleted

## 2018-11-19 VITALS — BP 123/68 | HR 60

## 2018-11-19 DIAGNOSIS — Z3042 Encounter for surveillance of injectable contraceptive: Secondary | ICD-10-CM | POA: Diagnosis present

## 2018-11-19 MED ORDER — MEDROXYPROGESTERONE ACETATE 150 MG/ML IM SUSP
150.0000 mg | Freq: Once | INTRAMUSCULAR | Status: AC
  Administered 2018-11-19: 150 mg via INTRAMUSCULAR

## 2018-11-19 NOTE — Progress Notes (Signed)
Debbe Bales here for Depo-Provera  Injection.  Injection administered without complication. Patient will return in 3 months for next injection.  Linda,RN 11/19/2018  9:37 AM

## 2018-11-19 NOTE — Progress Notes (Signed)
Patient seen and assessed by nursing staff.  Agree with documentation and plan.  

## 2019-02-04 ENCOUNTER — Ambulatory Visit (INDEPENDENT_AMBULATORY_CARE_PROVIDER_SITE_OTHER): Payer: Medicaid Other

## 2019-02-04 ENCOUNTER — Ambulatory Visit: Payer: Medicaid Other

## 2019-02-04 VITALS — BP 127/79 | HR 60 | Ht 68.0 in | Wt 178.8 lb

## 2019-02-04 DIAGNOSIS — Z3042 Encounter for surveillance of injectable contraceptive: Secondary | ICD-10-CM | POA: Diagnosis present

## 2019-02-04 MED ORDER — MEDROXYPROGESTERONE ACETATE 150 MG/ML IM SUSP
150.0000 mg | Freq: Once | INTRAMUSCULAR | Status: AC
  Administered 2019-02-04: 150 mg via INTRAMUSCULAR

## 2019-02-04 NOTE — Progress Notes (Signed)
Debbe Bales here for Depo-Provera  Injection.  Injection administered without complication. Patient will return in 3 months for next injection.  Ralene Bathe, RN 02/04/2019  9:19 AM

## 2019-02-05 NOTE — Progress Notes (Signed)
I have reviewed the chart and agree with nursing staff's documentation of this patient's encounter.  Catalina Antigua, MD 02/05/2019 10:36 AM

## 2019-02-25 ENCOUNTER — Emergency Department (HOSPITAL_COMMUNITY)
Admission: EM | Admit: 2019-02-25 | Discharge: 2019-02-25 | Disposition: A | Discharge status: Home / Self Care | Payer: Self-pay | Attending: Emergency Medicine | Admitting: Emergency Medicine

## 2019-02-25 ENCOUNTER — Other Ambulatory Visit: Payer: Self-pay

## 2019-02-25 ENCOUNTER — Encounter (HOSPITAL_COMMUNITY): Payer: Self-pay

## 2019-02-25 ENCOUNTER — Emergency Department (HOSPITAL_COMMUNITY): Payer: Self-pay

## 2019-02-25 DIAGNOSIS — E876 Hypokalemia: Secondary | ICD-10-CM | POA: Insufficient documentation

## 2019-02-25 DIAGNOSIS — R519 Headache, unspecified: Secondary | ICD-10-CM

## 2019-02-25 DIAGNOSIS — R202 Paresthesia of skin: Secondary | ICD-10-CM

## 2019-02-25 DIAGNOSIS — R51 Headache: Secondary | ICD-10-CM | POA: Insufficient documentation

## 2019-02-25 LAB — CBC
HEMATOCRIT: 38.9 % (ref 36.0–46.0)
HEMOGLOBIN: 12.4 g/dL (ref 12.0–15.0)
MCH: 28.7 pg (ref 26.0–34.0)
MCHC: 31.9 g/dL (ref 30.0–36.0)
MCV: 90 fL (ref 80.0–100.0)
Platelets: 304 10*3/uL (ref 150–400)
RBC: 4.32 MIL/uL (ref 3.87–5.11)
RDW: 12.5 % (ref 11.5–15.5)
WBC: 7.8 10*3/uL (ref 4.0–10.5)
nRBC: 0 % (ref 0.0–0.2)

## 2019-02-25 LAB — BASIC METABOLIC PANEL
ANION GAP: 7 (ref 5–15)
BUN: 10 mg/dL (ref 6–20)
CHLORIDE: 109 mmol/L (ref 98–111)
CO2: 21 mmol/L — ABNORMAL LOW (ref 22–32)
Calcium: 9.5 mg/dL (ref 8.9–10.3)
Creatinine, Ser: 0.69 mg/dL (ref 0.44–1.00)
GFR calc Af Amer: >60 mL/min (ref >60)
GLUCOSE: 92 mg/dL (ref 70–99)
POTASSIUM: 3.3 mmol/L — AB (ref 3.5–5.1)
Sodium: 137 mmol/L (ref 135–145)

## 2019-02-25 LAB — URINALYSIS, ROUTINE W REFLEX MICROSCOPIC
Bilirubin Urine: NEGATIVE
Glucose, UA: NEGATIVE mg/dL
HGB URINE DIPSTICK: NEGATIVE
Ketones, ur: NEGATIVE mg/dL
Leukocytes,Ua: NEGATIVE
Nitrite: NEGATIVE
Protein, ur: NEGATIVE mg/dL
SPECIFIC GRAVITY, URINE: 1.026 (ref 1.005–1.030)
pH: 5 (ref 5.0–8.0)

## 2019-02-25 LAB — I-STAT BETA HCG BLOOD, ED (MC, WL, AP ONLY)

## 2019-02-25 MED ORDER — METOCLOPRAMIDE HCL 5 MG/ML IJ SOLN
10.0000 mg | Freq: Once | INTRAMUSCULAR | Status: AC
  Administered 2019-02-25: 10 mg via INTRAVENOUS
  Filled 2019-02-25: qty 2

## 2019-02-25 MED ORDER — KETOROLAC TROMETHAMINE 30 MG/ML IJ SOLN
15.0000 mg | Freq: Once | INTRAMUSCULAR | Status: AC
  Administered 2019-02-25: 15 mg via INTRAVENOUS
  Filled 2019-02-25: qty 1

## 2019-02-25 MED ORDER — SODIUM CHLORIDE 0.9% FLUSH: 3.0000 mL | Freq: Once | INTRAVENOUS | Status: DC

## 2019-02-25 MED ORDER — SODIUM CHLORIDE 0.9 % IV BOLUS
1000.0000 mL | Freq: Once | INTRAVENOUS | Status: AC
  Administered 2019-02-25: 1000 mL via INTRAVENOUS

## 2019-02-25 MED ORDER — POTASSIUM CHLORIDE CRYS ER 20 MEQ PO TBCR
40.0000 meq | EXTENDED_RELEASE_TABLET | Freq: Once | ORAL | Status: AC
  Administered 2019-02-25: 40 meq via ORAL
  Filled 2019-02-25: qty 2

## 2019-02-25 MED ORDER — DIPHENHYDRAMINE HCL 50 MG/ML IJ SOLN
12.5000 mg | Freq: Once | INTRAMUSCULAR | Status: AC
  Administered 2019-02-25: 12.5 mg via INTRAVENOUS
  Filled 2019-02-25: qty 1

## 2019-02-25 NOTE — ED Notes (Signed)
Pt discharged with no acute distress. Ambulatory and verbalizes understanding of DC teaching with no questions at this time

## 2019-02-25 NOTE — ED Provider Notes (Signed)
[facility] EMERGENCY DEPARTMENT Provider Note   CSN: [id number] Arrival date & time: 02/25/19  1416    History   Chief Complaint Chief Complaint  Patient presents with  . Tingling    HPI Katherine Lindsey is a 33 y.o. female.     The history is provided by the patient and medical records. No language interpreter was used.     Katherine Lindsey is a 33 y.o. female  with a PMH as listed below who presents to the Emergency Department complaining of left-sided headache described as a pressure for the last 3 days.  Associated with tingling to both her upper and lower extremities.  Paresthesias are intermittent and will come and go.  She denies any specific area where she experiences this, such as the dermatomal area, stating that it is all of her extremities diffusely.  She reports "it is like I am tingling all over my body".  She denies any weakness.  No dizziness or lightheadedness.  No visual changes.  No neck pain or back pain.  Denies any aggravating factors.  She did take ibuprofen which helped a little.  Denies any history of similar.  Denies history of migraines.   Past Medical History:  Diagnosis Date  . Crohn's disease in remission [facility])    Hasn't had symptoms since 2004  . Gallstones     Patient Active Problem List   Diagnosis Date Noted  . High risk HPV infection 02/07/2017  . VBAC, delivered 03/19/2016  . History of low transverse cesarean section 11/28/2015  . Hammertoe 09/21/2014    Past Surgical History:  Procedure Laterality Date  . CESAREAN SECTION N/A 06/16/2013   Procedure: CESAREAN SECTION;  Surgeon: Reva Bores, MD;  Location: [facility] ORS;  Service: Obstetrics;  Laterality: N/A;  . CHOLECYSTECTOMY  08/28/2012   Procedure: LAPAROSCOPIC CHOLECYSTECTOMY WITH INTRAOPERATIVE CHOLANGIOGRAM;  Surgeon: [name], DO;  Location: [facility] OR;  Service: General;  Laterality: N/A;  . INDUCED ABORTION  02/2012     OB History    Gravida  4   Para   3   Term  3   Preterm      AB  1   Living  3     SAB  0   TAB  1   Ectopic      Multiple  0   Live Births  3        Obstetric Comments  2014 LTCS with extension of hysterotomy inferiorly         Home Medications    Prior to Admission medications   Medication Sig Start Date End Date Taking? Authorizing Provider  ibuprofen (ADVIL,MOTRIN) 200 MG tablet Take 400 mg by mouth every 6 (six) hours as needed for headache or moderate pain.   Yes [provider]  medroxyPROGESTERone (DEPO-PROVERA) 150 MG/ML injection Inject 150 mg into the muscle every 3 (three) months.   Yes [provider]  HYDROcodone-acetaminophen (NORCO/VICODIN) 5-325 MG tablet Take 1 tablet by mouth every 6 (six) hours as needed for severe pain. Patient not taking: Reported on 02/25/2019 07/20/18   Cristina Gong, PA-C    Family History Family History  Problem Relation Age of Onset  . Other Neg Hx   . Diabetes Neg Hx   . Heart disease Neg Hx   . Hyperlipidemia Neg Hx   . Hypertension Neg Hx   . Stroke Neg Hx     Social History Social History   Tobacco Use  .  Smoking status: Never Smoker  . Smokeless tobacco: Never Used  Substance Use Topics  . Alcohol use: Yes    Alcohol/week: 1.0 standard drinks    Types: 1 Glasses of wine per week  . Drug use: No     Allergies   Patient has no known allergies.   Review of Systems Review of Systems  Neurological: Positive for numbness (Tingling) and headaches. Negative for dizziness, syncope and weakness.  All other systems reviewed and are negative.    Physical Exam Updated Vital Signs BP 128/80 (BP Location: Right Arm)   Pulse 68   Temp 98.6 F (37 C) (Oral)   Resp 18   SpO2 100%   Physical Exam Vitals signs and nursing note reviewed.  Constitutional:      General: She is not in acute distress.    Appearance: She is well-developed.  HENT:     Head: Normocephalic and atraumatic.  Neck:     Musculoskeletal:  Neck supple.  Cardiovascular:     Rate and Rhythm: Normal rate and regular rhythm.     Heart sounds: Normal heart sounds. No murmur.  Pulmonary:     Effort: Pulmonary effort is normal. No respiratory distress.     Breath sounds: Normal breath sounds.  Abdominal:     General: There is no distension.     Palpations: Abdomen is soft.     Tenderness: There is no abdominal tenderness.  Musculoskeletal:     Comments: No C/T/L-spine tenderness. All 4 extremities with 2+ distal pulses and equal/intact sensation. 5/5 muscle strength in upper and lower extremities bilaterally including strong and equal grip strength and dorsiflexion/plantar flexion.  Skin:    General: Skin is warm and dry.  Neurological:     Mental Status: She is alert and oriented to person, place, and time.     Comments: Alert, oriented, thought content appropriate, able to give a coherent history. Speech is clear and goal oriented, able to follow commands.  Cranial Nerves:  II:  Peripheral visual fields grossly normal, pupils equal, round, reactive to light III, IV, VI: EOM intact bilaterally, ptosis not present V,VII: smile symmetric, eyes kept closed tightly against resistance, facial light touch sensation equal VIII: hearing grossly normal IX, X: symmetric soft palate movement, uvula elevates symmetrically  XI: bilateral shoulder shrug symmetric and strong XII: midline tongue extension Sensory to light touch normal in all four extremities.  Normal finger-to-nose and rapid alternating movements; normal gait and balance. No drift.      ED Treatments / Results  Labs (all labs ordered are listed, but only abnormal results are displayed) Labs Reviewed  BASIC METABOLIC PANEL - Abnormal; Notable for the following components:      Result Value   Potassium 3.3 (*)    CO2 21 (*)    All other components within normal limits  CBC  URINALYSIS, ROUTINE W REFLEX MICROSCOPIC  I-STAT BETA HCG BLOOD, ED ([facility], [facility], [facility] ONLY)     EKG None  Radiology Ct Head Wo Contrast  Result Date: 02/25/2019 CLINICAL DATA:  Acute onset of headache and bilateral weakness. EXAM: CT HEAD WITHOUT CONTRAST TECHNIQUE: Contiguous axial images were obtained from the base of the skull through the vertex without intravenous contrast. COMPARISON:  None. FINDINGS: Brain: No evidence of acute infarction, hemorrhage, hydrocephalus, extra-axial collection or mass lesion/mass effect. The posterior fossa, including the cerebellum, brainstem and fourth ventricle, is within normal limits. The third and lateral ventricles, and basal ganglia are unremarkable in appearance. The cerebral  hemispheres are symmetric in appearance, with normal gray-white differentiation. No mass effect or midline shift is seen. Vascular: No hyperdense vessel or unexpected calcification. Skull: There is no evidence of fracture; visualized osseous structures are unremarkable in appearance. Sinuses/Orbits: The visualized portions of the orbits are within normal limits. The paranasal sinuses and mastoid air cells are well-aerated. Other: No significant soft tissue abnormalities are seen. IMPRESSION: Unremarkable noncontrast CT of the head. Electronically Signed   By: Roanna Raider M.D.   On: 02/25/2019 20:36    Procedures Procedures (including critical care time)  Medications Ordered in ED Medications  sodium chloride flush (NS) 0.9 % injection 3 mL (3 mLs Intravenous Not Given 02/25/19 1944)  potassium chloride SA (K-DUR,KLOR-CON) CR tablet 40 mEq (40 mEq Oral Given 02/25/19 2115)  sodium chloride 0.9 % bolus 1,000 mL (0 mLs Intravenous Stopped 02/25/19 2231)  metoCLOPramide (REGLAN) injection 10 mg (10 mg Intravenous Given 02/25/19 2104)  diphenhydrAMINE (BENADRYL) injection 12.5 mg (12.5 mg Intravenous Given 02/25/19 2103)  ketorolac (TORADOL) 30 MG/ML injection 15 mg (15 mg Intravenous Given 02/25/19 2103)     Initial Impression / Assessment and Plan / ED Course  I have reviewed  the triage vital signs and the nursing notes.  Pertinent labs & imaging results that were available during my care of the patient were reviewed by me and considered in my medical decision making (see chart for details).       Katherine Lindsey is a 33 y.o. female who presents to ED for left-sided headache.  Denies history of migraines.  Reports intermittent associated paresthesias.  These are not dermatomal.  She reports whole body tingling which comes and goes.  No focal numbness or weakness reported. No  neurologic deficits.  No midline C/T/L-spine tenderness.  Labs reviewed and notable for hypokalemia at 3.3 which was replenished in ED -otherwise reassuring.  CT head unremarkable. The patient denies any neurologic symptoms such as visual changes, balance problems, confusion, or speech difficulty to suggest a life-threatening intracranial process such as intracranial hemorrhage or mass. The patient has no clotting risk factors thus venous sinus thrombosis is unlikely. No fevers, neck pain or nuchal rigidity to suggest meningitis. Considered differential of MS, however feel this is quite unlikely.  On reevaluation, patient feels improved.  Her symptoms are nearly resolved and she feels comfortable with discharge home.  I feel that the patient is safe for discharge home at this time, however strongly encouraged to follow-up with neurology for further work-up of the symptoms, possibly outpatient MRI. I have reviewed return precautions including development of neurologic symptoms, confusion, lethargy, difficulty speaking, or new/worsening/concerning symptoms. All questions answered.  Patient discussed with Dr. Rubin Payor who agrees with treatment plan.    Final Clinical Impressions(s) / ED Diagnoses   Final diagnoses:  Hypokalemia  Paresthesia  Acute nonintractable headache, unspecified headache type    ED Discharge Orders    None       Kekai Geter, Chase Picket, PA-C 02/25/19 2250     Benjiman Core, MD 02/27/19 2353

## 2019-02-25 NOTE — ED Triage Notes (Signed)
Pt reports right arm tingling and some pain and tingling in the left side of her head X3 days. Pt alert and oriented, denies chest pain. Pt denies recent stress or hx of migraines. AOX4 in triage.

## 2019-02-25 NOTE — ED Notes (Signed)
Pt given urine cup to give sample in the waiting room. Will bring sample triage.

## 2019-02-25 NOTE — Discharge Instructions (Signed)
It was my pleasure taking care of you today!   Stay hydrated.   Call the neurology clinic listed in the morning to schedule a follow up appointment.   If you develop worsening headache, new fever, new neck stiffness, rash, weakness, numbness, trouble with your speech, trouble walking, new or worsening symptoms or any concerning symptoms, please return to the ED immediately.

## 2019-04-14 ENCOUNTER — Ambulatory Visit (INDEPENDENT_AMBULATORY_CARE_PROVIDER_SITE_OTHER): Payer: Self-pay | Admitting: Orthopedic Surgery

## 2019-04-16 ENCOUNTER — Encounter: Payer: Self-pay | Admitting: *Deleted

## 2019-04-22 ENCOUNTER — Other Ambulatory Visit: Payer: Self-pay

## 2019-04-22 ENCOUNTER — Ambulatory Visit (INDEPENDENT_AMBULATORY_CARE_PROVIDER_SITE_OTHER): Payer: Medicaid Other

## 2019-04-22 DIAGNOSIS — Z3042 Encounter for surveillance of injectable contraceptive: Secondary | ICD-10-CM | POA: Diagnosis not present

## 2019-04-22 MED ORDER — MEDROXYPROGESTERONE ACETATE 150 MG/ML IM SUSP
150.0000 mg | Freq: Once | INTRAMUSCULAR | Status: AC
  Administered 2019-04-22: 10:00:00 150 mg via INTRAMUSCULAR

## 2019-04-22 NOTE — Progress Notes (Signed)
Debbe Bales here for Depo-Provera  Injection.  Injection administered without complication. Patient will return in 3 months for next injection.  Ralene Bathe, RN 04/22/2019  9:07 AM

## 2019-04-22 NOTE — Progress Notes (Signed)
I have reviewed this chart and agree with the RN/CMA assessment and management.    K. Meryl Davis, M.D. Attending [facility] (Faculty Practice)   

## 2019-04-23 ENCOUNTER — Ambulatory Visit: Payer: Medicaid Other

## 2019-04-28 ENCOUNTER — Other Ambulatory Visit: Payer: Self-pay

## 2019-04-28 ENCOUNTER — Emergency Department (HOSPITAL_COMMUNITY): Payer: Self-pay

## 2019-04-28 ENCOUNTER — Encounter (HOSPITAL_COMMUNITY): Payer: Self-pay | Admitting: Emergency Medicine

## 2019-04-28 ENCOUNTER — Emergency Department (HOSPITAL_COMMUNITY)
Admission: EM | Admit: 2019-04-28 | Discharge: 2019-04-28 | Disposition: A | Discharge status: Home / Self Care | Payer: Self-pay | Attending: Emergency Medicine | Admitting: Emergency Medicine

## 2019-04-28 DIAGNOSIS — Z79899 Other long term (current) drug therapy: Secondary | ICD-10-CM | POA: Insufficient documentation

## 2019-04-28 DIAGNOSIS — J029 Acute pharyngitis, unspecified: Secondary | ICD-10-CM | POA: Insufficient documentation

## 2019-04-28 DIAGNOSIS — K219 Gastro-esophageal reflux disease without esophagitis: Secondary | ICD-10-CM | POA: Insufficient documentation

## 2019-04-28 LAB — GROUP A STREP BY PCR: Group A Strep by PCR: NOT DETECTED

## 2019-04-28 MED ORDER — FAMOTIDINE 20 MG PO TABS: 20.0000 mg | ORAL_TABLET | Freq: Two times a day (BID) | ORAL | 0 refills | Status: DC

## 2019-04-28 MED ORDER — ALUM & MAG HYDROXIDE-SIMETH 200-200-20 MG/5ML PO SUSP
30.0000 mL | Freq: Once | ORAL | Status: AC
  Administered 2019-04-28: 30 mL via ORAL
  Filled 2019-04-28: qty 30

## 2019-04-28 MED ORDER — FAMOTIDINE 20 MG PO TABS
20.0000 mg | ORAL_TABLET | Freq: Once | ORAL | Status: AC
  Administered 2019-04-28: 20 mg via ORAL
  Filled 2019-04-28: qty 1

## 2019-04-28 MED ORDER — ALUM & MAG HYDROXIDE-SIMETH 400-400-40 MG/5ML PO SUSP: 5.0000 mL | Freq: Four times a day (QID) | ORAL | 0 refills | Status: DC | PRN

## 2019-04-28 MED ORDER — LIDOCAINE VISCOUS HCL 2 % MT SOLN
15.0000 mL | Freq: Once | OROMUCOSAL | Status: AC
  Administered 2019-04-28: 15 mL via ORAL
  Filled 2019-04-28: qty 15

## 2019-04-28 NOTE — ED Triage Notes (Signed)
Pt. Stated, Katherine Lindsey had some cold in my chest and my throat sometimes feels like its closing up.

## 2019-04-28 NOTE — ED Provider Notes (Signed)
[facility] EMERGENCY DEPARTMENT Provider Note   CSN: [id number] Arrival date & time: 04/28/19  0623    History   Chief Complaint Chief Complaint  Patient presents with  . Chest Pain  . Sore Throat  . Nasal Congestion    HPI Katherine Lindsey is a 33 y.o. female who presents to ED for 1 week history of central chest discomfort with associated shortness of breath, sore throat, chest congestion.  She is concerned that it could be due to anxiety or acid reflux.  States that she gets a "weird feeling all over my body like yesterday when I was at work."  She has tried Mucinex for the chest congestion with no improvement in her symptoms.  She states that the symptoms improve when she belches or changes her diet.  She denies any cough, fever, hemoptysis, leg swelling, history of DVT, PE or MI.  Denies any recent travel or sick contacts with similar symptoms.     HPI  Past Medical History:  Diagnosis Date  . Crohn's disease in remission [facility])    Hasn't had symptoms since 2004  . Gallstones     Patient Active Problem List   Diagnosis Date Noted  . High risk HPV infection 02/07/2017  . VBAC, delivered 03/19/2016  . History of low transverse cesarean section 11/28/2015  . Hammertoe 09/21/2014    Past Surgical History:  Procedure Laterality Date  . CESAREAN SECTION N/A 06/16/2013   Procedure: CESAREAN SECTION;  Surgeon: Reva Bores, MD;  Location: [facility] ORS;  Service: Obstetrics;  Laterality: N/A;  . CHOLECYSTECTOMY  08/28/2012   Procedure: LAPAROSCOPIC CHOLECYSTECTOMY WITH INTRAOPERATIVE CHOLANGIOGRAM;  Surgeon: [name], DO;  Location: [facility] OR;  Service: General;  Laterality: N/A;  . INDUCED ABORTION  02/2012     OB History    Gravida  4   Para  3   Term  3   Preterm      AB  1   Living  3     SAB  0   TAB  1   Ectopic      Multiple  0   Live Births  3        Obstetric Comments  2014 LTCS with extension of hysterotomy inferiorly         Home Medications    Prior to Admission medications   Medication Sig Start Date End Date Taking? Authorizing Provider  ibuprofen (ADVIL,MOTRIN) 200 MG tablet Take 400 mg by mouth every 6 (six) hours as needed for headache or moderate pain.   Yes [provider]  medroxyPROGESTERone (DEPO-PROVERA) 150 MG/ML injection Inject 150 mg into the muscle every 3 (three) months.   Yes [provider]  alum & mag hydroxide-simeth (MAALOX MAX) 400-400-40 MG/5ML suspension Take 5 mLs by mouth every 6 (six) hours as needed for indigestion. 04/28/19   Dacari Beckstrand, PA-C  famotidine (PEPCID) 20 MG tablet Take 1 tablet (20 mg total) by mouth 2 (two) times daily. 04/28/19   Macrae Wiegman, PA-C  HYDROcodone-acetaminophen (NORCO/VICODIN) 5-325 MG tablet Take 1 tablet by mouth every 6 (six) hours as needed for severe pain. Patient not taking: Reported on 02/25/2019 07/20/18   Cristina Gong, PA-C    Family History Family History  Problem Relation Age of Onset  . Other Neg Hx   . Diabetes Neg Hx   . Heart disease Neg Hx   . Hyperlipidemia Neg Hx   . Hypertension Neg Hx   . Stroke  Neg Hx     Social History Social History   Tobacco Use  . Smoking status: Never Smoker  . Smokeless tobacco: Never Used  Substance Use Topics  . Alcohol use: Yes    Alcohol/week: 1.0 standard drinks    Types: 1 Glasses of wine per week  . Drug use: No     Allergies   Patient has no known allergies.   Review of Systems Review of Systems  Constitutional: Negative for appetite change, chills and fever.  HENT: Positive for congestion. Negative for ear pain, rhinorrhea, sneezing and sore throat.   Eyes: Negative for photophobia and visual disturbance.  Respiratory: Positive for shortness of breath. Negative for cough, chest tightness and wheezing.   Cardiovascular: Positive for chest pain. Negative for palpitations.  Gastrointestinal: Negative for abdominal pain, blood in stool, constipation,  diarrhea, nausea and vomiting.  Genitourinary: Negative for dysuria, hematuria and urgency.  Musculoskeletal: Negative for myalgias.  Skin: Negative for rash.  Neurological: Negative for dizziness, weakness and light-headedness.     Physical Exam Updated Vital Signs BP 129/84   Pulse 65   Temp 99.3 F (37.4 C) (Oral)   Resp 13   Ht  (1.727 m)   Wt 82.1 kg   SpO2 100%   BMI 27.52 kg/m   Physical Exam Vitals signs and nursing note reviewed.  Constitutional:      General: She is not in acute distress.    Appearance: She is well-developed.  HENT:     Head: Normocephalic and atraumatic.     Nose: Nose normal.     Mouth/Throat:     Pharynx: Posterior oropharyngeal erythema present.     Tonsils: No tonsillar exudate or tonsillar abscesses. 1+ on the right. 1+ on the left.     Comments: Patient does not appear to be in acute distress. No trismus or drooling present. No pooling of secretions. Patient is tolerating secretions and is not in respiratory distress. No neck pain or tenderness to palpation of the neck. Full active and passive range of motion of the neck. No evidence of RPA or PTA. Eyes:     General: No scleral icterus.       Right eye: No discharge.        Left eye: No discharge.     Conjunctiva/sclera: Conjunctivae normal.  Neck:     Musculoskeletal: Normal range of motion and neck supple.  Cardiovascular:     Rate and Rhythm: Normal rate and regular rhythm.     Heart sounds: Normal heart sounds. No murmur. No friction rub. No gallop.   Pulmonary:     Effort: Pulmonary effort is normal. No respiratory distress.     Breath sounds: Normal breath sounds.  Abdominal:     General: Bowel sounds are normal. There is no distension.     Palpations: Abdomen is soft.     Tenderness: There is no abdominal tenderness. There is no guarding.  Musculoskeletal: Normal range of motion.     Comments: No lower extremity edema, erythema or calf tenderness bilaterally.  Skin:     General: Skin is warm and dry.     Findings: No rash.  Neurological:     Mental Status: She is alert.     Motor: No abnormal muscle tone.     Coordination: Coordination normal.      ED Treatments / Results  Labs (all labs ordered are listed, but only abnormal results are displayed) Labs Reviewed  GROUP A STREP BY PCR  EKG EKG Interpretation  Date/Time:  Tuesday Apr 28 2019 10:14:50 EDT Ventricular Rate:  67 PR Interval:    QRS Duration: 90 QT Interval:  372 QTC Calculation: 393 R Axis:   81 Text Interpretation:  Sinus rhythm Low voltage, precordial leads Borderline T abnormalities, diffuse leads Confirmed by Benjiman Core [phone]) on 04/28/2019 10:17:10 AM   Radiology Dg Chest 2 View  Result Date: 04/28/2019 CLINICAL DATA:  Chest congestion EXAM: CHEST - 2 VIEW COMPARISON:  None. FINDINGS: The heart size and mediastinal contours are within normal limits. Both lungs are clear. The visualized skeletal structures are unremarkable. IMPRESSION: No active cardiopulmonary disease. Electronically Signed   By: Marlan Palau M.D.   On: 04/28/2019 10:46    Procedures Procedures (including critical care time)  Medications Ordered in ED Medications  alum & mag hydroxide-simeth (MAALOX/MYLANTA) 200-200-20 MG/5ML suspension 30 mL (30 mLs Oral Given 04/28/19 1025)    And  lidocaine (XYLOCAINE) 2 % viscous mouth solution 15 mL (15 mLs Oral Given 04/28/19 1025)  famotidine (PEPCID) tablet 20 mg (20 mg Oral Given 04/28/19 1025)     Initial Impression / Assessment and Plan / ED Course  I have reviewed the triage vital signs and the nursing notes.  Pertinent labs & imaging results that were available during my care of the patient were reviewed by me and considered in my medical decision making (see chart for details).        RASHETA FARRAH was evaluated in Emergency Department on 04/28/19 for the symptoms described in the history of present illness. He/she was evaluated in  the context of the global COVID-19 pandemic, which necessitated consideration that the patient might be at risk for infection with the SARS-CoV-2 virus that causes COVID-19. Institutional protocols and algorithms that pertain to the evaluation of patients at risk for COVID-19 are in a state of rapid change based on information released by regulatory bodies including the CDC and federal and state organizations. These policies and algorithms were followed during the patient's care in the ED.  33 year old female presents to ED for central chest discomfort, chest congestion, shortness of breath and sore throat. She describes a sharp, burning sensation in her chest which is relieved by belching and changing her diet.  She has had no improvement with Mucinex for her congestion.  Feels that her "throat is closing up."  She denies any trouble swallowing. There is mild tonsillar enlargement bilaterally, symmetrically without exudates.  No respiratory distress.  Speaking in complete sentences without difficulty. No lower extremity edema, erythema or calf tenderness that would concern me for DVT. Vital signs are within normal limits.  Will obtain EKG, strep swab, give GI cocktail and Pepcid and reassess. She is PERC negative, so I doubt PE as the cause of her symptoms.  On recheck, patient symptoms resolved with GI cocktail and Pepcid.  Chest x-ray is unremarkable.  Strep swab is negative.  EKG shows sinus rhythm with no ischemic changes.  She is low risk by heart score, her symptoms seem atypical for ACS given her age, characteristics of symptoms and risk factors.  No evidence of RPA or PTA noted on exam of posterior oropharynx, posterior pharynx is widely patent, she is having no trouble breathing or trouble swallowing.  She is comfortable with discharge home and outpatient follow-up.  I suspect a component of anxiety as well as she is concerned about this and her symptoms seem to worsen at work.  We will have her  follow-up with PCP  and return for any worsening symptoms.  Patient is hemodynamically stable, in NAD, and able to ambulate in the ED. Evaluation does not show pathology that would require ongoing emergent intervention or inpatient treatment. I explained the diagnosis to the patient. Pain has been managed and has no complaints prior to discharge. Patient is comfortable with above plan and is stable for discharge at this time. All questions were answered prior to disposition. Strict return precautions for returning to the ED were discussed. Encouraged follow up with PCP.   An After Visit Summary was printed and given to the patient.   Portions of this note were generated with Scientist, clinical (histocompatibility and immunogenetics). Dictation errors may occur despite best attempts at proofreading.  Final Clinical Impressions(s) / ED Diagnoses   Final diagnoses:  Gastroesophageal reflux disease, esophagitis presence not specified  Viral pharyngitis    ED Discharge Orders         Ordered    famotidine (PEPCID) 20 MG tablet  2 times daily     04/28/19 1225    alum & mag hydroxide-simeth (MAALOX MAX) 400-400-40 MG/5ML suspension  Every 6 hours PRN     04/28/19 1225           Dietrich Pates, PA-C 04/28/19 1227    Benjiman Core, MD 04/28/19 1525

## 2019-04-28 NOTE — ED Notes (Signed)
Patient transported to X-ray 

## 2019-04-28 NOTE — Discharge Instructions (Signed)
Follow-up with your primary care provider. Take the medication as needed for your discomfort. Return to the ED if you start to have worsening pain, shortness of breath, leg swelling, vomiting or coughing up blood, lightheadedness.

## 2019-04-28 NOTE — ED Notes (Signed)
Pt reports pain in central chest worse with lying down and pain goes into throat. When she burps pain is better or drinks something.

## 2019-04-30 ENCOUNTER — Ambulatory Visit: Payer: Self-pay | Admitting: Neurology

## 2019-05-27 ENCOUNTER — Encounter (HOSPITAL_COMMUNITY): Payer: Self-pay | Admitting: Emergency Medicine

## 2019-05-27 ENCOUNTER — Emergency Department (HOSPITAL_COMMUNITY): Payer: Medicaid Other

## 2019-05-27 ENCOUNTER — Other Ambulatory Visit: Payer: Self-pay

## 2019-05-27 ENCOUNTER — Emergency Department (HOSPITAL_COMMUNITY)
Admission: EM | Admit: 2019-05-27 | Discharge: 2019-05-28 | Disposition: A | Discharge status: Home / Self Care | Payer: Medicaid Other | Attending: Emergency Medicine | Admitting: Emergency Medicine

## 2019-05-27 DIAGNOSIS — Z79899 Other long term (current) drug therapy: Secondary | ICD-10-CM | POA: Insufficient documentation

## 2019-05-27 DIAGNOSIS — R0789 Other chest pain: Secondary | ICD-10-CM | POA: Insufficient documentation

## 2019-05-27 DIAGNOSIS — R1013 Epigastric pain: Secondary | ICD-10-CM | POA: Insufficient documentation

## 2019-05-27 LAB — BASIC METABOLIC PANEL
Anion gap: 12 (ref 5–15)
BUN: 9 mg/dL (ref 6–20)
CO2: 19 mmol/L — ABNORMAL LOW (ref 22–32)
Calcium: 9.4 mg/dL (ref 8.9–10.3)
Chloride: 107 mmol/L (ref 98–111)
Creatinine, Ser: 0.69 mg/dL (ref 0.44–1.00)
GFR calc Af Amer: >60 mL/min (ref >60)
GFR calc non Af Amer: >60 mL/min (ref >60)
Glucose, Bld: 109 mg/dL — ABNORMAL HIGH (ref 70–99)
Potassium: 3.9 mmol/L (ref 3.5–5.1)
Sodium: 138 mmol/L (ref 135–145)

## 2019-05-27 LAB — CBC
HCT: 39.9 % (ref 36.0–46.0)
Hemoglobin: 12.8 g/dL (ref 12.0–15.0)
MCH: 29 pg (ref 26.0–34.0)
MCHC: 32.1 g/dL (ref 30.0–36.0)
MCV: 90.3 fL (ref 80.0–100.0)
Platelets: 315 10*3/uL (ref 150–400)
RBC: 4.42 MIL/uL (ref 3.87–5.11)
RDW: 12.3 % (ref 11.5–15.5)
WBC: 10 10*3/uL (ref 4.0–10.5)
nRBC: 0 % (ref 0.0–0.2)

## 2019-05-27 LAB — I-STAT BETA HCG BLOOD, ED (NOT ORDERABLE): I-stat hCG, quantitative: <5 m[IU]/mL (ref <5)

## 2019-05-27 LAB — TROPONIN I: Troponin I: <0.03 ng/mL (ref <0.03)

## 2019-05-27 MED ORDER — ALUM & MAG HYDROXIDE-SIMETH 200-200-20 MG/5ML PO SUSP
30.0000 mL | Freq: Once | ORAL | Status: AC
  Administered 2019-05-27: 30 mL via ORAL
  Filled 2019-05-27: qty 30

## 2019-05-27 MED ORDER — LIDOCAINE VISCOUS HCL 2 % MT SOLN
15.0000 mL | Freq: Once | OROMUCOSAL | Status: AC
  Administered 2019-05-27: 15 mL via ORAL
  Filled 2019-05-27: qty 15

## 2019-05-27 MED ORDER — SODIUM CHLORIDE 0.9% FLUSH: 3.0000 mL | Freq: Once | INTRAVENOUS | Status: DC

## 2019-05-27 NOTE — ED Triage Notes (Signed)
Patient states he is having mid chest pain and she states she is having anxiety. Patient states that when she burps it helps relieves some of the pain. Patient has not did follow up from last visit.

## 2019-05-27 NOTE — ED Provider Notes (Signed)
[facility]   CSN: [id number] Arrival date & time: 05/27/19  2103    History   Chief Complaint Chief Complaint  Patient presents with   Chest Pain    HPI Debbe BalesBarbara A Lindsey is a 33 y.o. female.  HPI: A 33 year old patient presents for evaluation of chest pain. Initial onset of pain was more than 6 hours ago. The patient's chest pain is not worse with exertion. The patient's chest pain is middle- or left-sided, is not well-localized, is not described as heaviness/pressure/tightness, is not sharp and does not radiate to the arms/jaw/neck. The patient does not complain of nausea and denies diaphoresis. The patient has no history of stroke, has no history of peripheral artery disease, has not smoked in the past 90 days, denies any history of treated diabetes, has no relevant family history of coronary artery disease (first degree relative at less than age 32), is not hypertensive, has no history of hypercholesterolemia and does not have an elevated BMI (>=30).   33 y.o female with a PMH of Chron's presents to the ED with a chief complaint of chest pain x several weeks but worsening today. Patient describes a "soreness" to the central aspect of her chest worse with touch, better with burping. She reports having these episodes throughout the week but they seem to worsen today.  Patient reports she is happening around 1:00 and the episodes began right around 6 PM.  Patient did have a similar episode in the past, was told to follow-up but was unable to do so due to lack of primary care. Has not taking any medication for relieving symptoms.  Patient reports when she feels the episodes began she feels short of breath that she feels "I feel like he gives me anxiety ".  Denies any smoking history or previous history of blood clots.  She is currently on a Depakote Provera injection which she obtained on May 7.  Denies any fever, shortness of breath, nausea,  vomiting, diarrhea or abdominal pain.  Denies any previous history of CAD or family history of sudden death.     Past Medical History:  Diagnosis Date   Crohn's disease in remission [facility](H[facility])    Hasn't had symptoms since 2004   Gallstones     Patient Active Problem List   Diagnosis Date Noted   High risk HPV infection 02/07/2017   VBAC, delivered 03/19/2016   History of low transverse cesarean section 11/28/2015   Hammertoe 09/21/2014    Past Surgical History:  Procedure Laterality Date   CESAREAN SECTION N/A 06/16/2013   Procedure: CESAREAN SECTION;  Surgeon: Reva Boresanya S Pratt, MD;  Location: [facility] ORS;  Service: Obstetrics;  Laterality: N/A;   CHOLECYSTECTOMY  08/28/2012   Procedure: LAPAROSCOPIC CHOLECYSTECTOMY WITH INTRAOPERATIVE CHOLANGIOGRAM;  Surgeon: Lodema PilotBrian Layton, DO;  Location: [facility] OR;  Service: General;  Laterality: N/A;   INDUCED ABORTION  02/2012     OB History    Gravida  4   Para  3   Term  3   Preterm      AB  1   Living  3     SAB  0   TAB  1   Ectopic      Multiple  0   Live Births  3        Obstetric Comments  2014 LTCS with extension of hysterotomy inferiorly         Home Medications    Prior to Admission medications  Medication Sig Start Date End Date Taking? Authorizing Provider  ibuprofen (ADVIL,MOTRIN) 200 MG tablet Take 400 mg by mouth every 6 (six) hours as needed for headache or moderate pain.   Yes [provider]  medroxyPROGESTERone (DEPO-PROVERA) 150 MG/ML injection Inject 150 mg into the muscle every 3 (three) months.   Yes [provider]  alum & mag hydroxide-simeth (MAALOX MAX) 400-400-40 MG/5ML suspension Take 5 mLs by mouth every 6 (six) hours as needed for indigestion. Patient not taking: Reported on 05/27/2019 04/28/19   [name], PA-C  famotidine (PEPCID) 20 MG tablet Take 1 tablet (20 mg total) by mouth 2 (two) times daily for 7 days. 05/28/19 06/04/19  [name], PA-C    HYDROcodone-acetaminophen (NORCO/VICODIN) 5-325 MG tablet Take 1 tablet by mouth every 6 (six) hours as needed for severe pain. Patient not taking: Reported on 02/25/2019 07/20/18   [name], PA-C    Family History Family History  Problem Relation Age of Onset   Other Neg Hx    Diabetes Neg Hx    Heart disease Neg Hx    Hyperlipidemia Neg Hx    Hypertension Neg Hx    Stroke Neg Hx     Social History Social History   Tobacco Use   Smoking status: Never Smoker   Smokeless tobacco: Never Used  Substance Use Topics   Alcohol use: Yes    Alcohol/week: 1.0 standard drinks    Types: 1 Glasses of wine per week   Drug use: No     Allergies   Patient has no known allergies.   Review of Systems Review of Systems  Constitutional: Negative for chills and fever.  HENT: Negative for ear pain and sore throat.   Eyes: Negative for pain and visual disturbance.  Respiratory: Positive for shortness of breath. Negative for cough.   Cardiovascular: Positive for chest pain. Negative for palpitations.  Gastrointestinal: Negative for abdominal pain and vomiting.  Genitourinary: Negative for dysuria and hematuria.  Musculoskeletal: Negative for arthralgias and back pain.  Skin: Negative for color change and rash.  Neurological: Negative for seizures and syncope.  All other systems reviewed and are negative.    Physical Exam Updated Vital Signs BP 131/79    Pulse 72    Temp 98.9 F (37.2 C) (Oral)    Resp 13    Ht 5\' 8"  (1.727 m)    Wt 82.1 kg    SpO2 100%    BMI 27.52 kg/m   Physical Exam Vitals signs and nursing note reviewed.  Constitutional:      General: She is not in acute distress.    Appearance: She is well-developed.  HENT:     Head: Normocephalic and atraumatic.     Mouth/Throat:     Pharynx: No oropharyngeal exudate.  Eyes:     Pupils: Pupils are equal, round, and reactive to light.  Neck:     Musculoskeletal: Normal range of motion.   Cardiovascular:     Rate and Rhythm: Regular rhythm.     Heart sounds: Normal heart sounds.  Pulmonary:     Effort: Pulmonary effort is normal. No respiratory distress.     Breath sounds: Normal breath sounds.     Comments: No wheezing, rales, rhonchi.  Chest:     Chest wall: Tenderness present. No crepitus or edema.       Comments: Tenderness with palpation of the midline chest. Abdominal:     General: Bowel sounds are normal. There is no distension.  Palpations: Abdomen is soft.     Tenderness: There is no abdominal tenderness. There is no right CVA tenderness or left CVA tenderness.    Musculoskeletal:        General: No tenderness or deformity.     Right lower leg: No edema.     Left lower leg: No edema.  Skin:    General: Skin is warm and dry.  Neurological:     Mental Status: She is alert and oriented to person, place, and time.      ED Treatments / Results  Labs (all labs ordered are listed, but only abnormal results are displayed) Labs Reviewed  BASIC METABOLIC PANEL - Abnormal; Notable for the following components:      Result Value   CO2 19 (*)    Glucose, Bld 109 (*)    All other components within normal limits  CBC  TROPONIN I  I-STAT BETA HCG BLOOD, ED ([facility], [facility], [facility] ONLY)  I-STAT BETA HCG BLOOD, ED (NOT ORDERABLE)    EKG EKG Interpretation  Date/Time:  Wednesday May 27 2019 21:17:30 EDT Ventricular Rate:  81 PR Interval:    QRS Duration: 82 QT Interval:  369 QTC Calculation: 429 R Axis:   83 Text Interpretation:  Sinus rhythm Normal ECG No significant change was found Confirmed by Paula Libra ([id number]) on 05/27/2019 10:46:54 PM   Radiology Dg Chest 2 View  Result Date: 05/27/2019 CLINICAL DATA:  Chest pain EXAM: CHEST - 2 VIEW COMPARISON:  None. FINDINGS: The heart size and mediastinal contours are within normal limits. Both lungs are clear. The visualized skeletal structures are unremarkable. IMPRESSION: No active cardiopulmonary disease.  Electronically Signed   By: Deatra Robinson M.D.   On: 05/27/2019 21:47    Procedures Procedures (including critical care time)  Medications Ordered in ED Medications  sodium chloride flush (NS) 0.9 % injection 3 mL (has no administration in time range)  acetaminophen (TYLENOL) tablet 650 mg (has no administration in time range)  famotidine (PEPCID) 40 MG/5ML suspension 10.4 mg (has no administration in time range)  alum & mag hydroxide-simeth (MAALOX/MYLANTA) 200-200-20 MG/5ML suspension 30 mL (30 mLs Oral Given 05/27/19 2324)    And  lidocaine (XYLOCAINE) 2 % viscous mouth solution 15 mL (15 mLs Oral Given 05/27/19 2324)     Initial Impression / Assessment and Plan / ED Course  I have reviewed the triage vital signs and the nursing notes.  Pertinent labs & imaging results that were available during my care of the patient were reviewed by me and considered in my medical decision making (see chart for details).    Patient with no past medical history presents to the ED with complaints of atypical chest pain, reports this is been going on for several weeks worsened today while she was at work.  Does report eating around 1 PM and began to have belching around 6 PM.  She reports that burping does help with her symptoms, states she has pain to her chest which radiates into her throat.  Was seen for similar episode in the past and reports she did not have follow-up but she changed physicians.  Heart Score 0  She was given GI cocktail for relieving symptoms.  Reports some improvement.  I have reviewed patient's laboratory results along with chest x-ray at the bedside with patient.  She states "I think I just get a little anxious ", BMP showed no electrolyte abnormality, creatinine level is within normal limits.  CBC showed no leukocytosis,  hemoglobin is within normal limits.  Troponin was negative.  Beta hCG was negative.  EKG showed no changes consistent with infarct or STEMI.  Chest x-ray showed  no acute consolidation, pleural effusion, pneumothorax.  Patient with stable vital signs.  No hypoxia or tachycardia, low suspicion for any PE, patient is currently on Depo-Provera, denies any shortness of breath.  No swelling to legs, lungs are clear to auscultation low suspicion for any heart failure.  She denies any family history of cardiac disease, does not have a history of cardiac disease herself.  Patient was also provided with Pepcid, Tylenol to help with her pain, her pain does to seem reproducible with palpation so suspicion for pleuritis.  Patient has been instructed to follow-up with [facility], will give her a referral for them.  Patient understands and agrees with management.  Return precautions provided.  Portions of this note were generated with Scientist, clinical (histocompatibility and immunogenetics)Dragon dictation software. Dictation errors may occur despite best attempts at proofreading.   Final Clinical Impressions(s) / ED Diagnoses   Final diagnoses:  Atypical chest pain    ED Discharge Orders         Ordered    famotidine (PEPCID) 20 MG tablet  2 times daily     05/28/19 0025           Claude MangesSoto, Brason Berthelot, PA-C 05/28/19 0053    Linwood DibblesKnapp, Jon, MD 05/28/19 [phone]

## 2019-05-28 MED ORDER — FAMOTIDINE 20 MG PO TABS: 20.0000 mg | ORAL_TABLET | Freq: Two times a day (BID) | ORAL | 0 refills | Status: DC

## 2019-05-28 MED ORDER — FAMOTIDINE 40 MG/5ML PO SUSR
10.0000 mg | Freq: Once | ORAL | Status: AC
  Administered 2019-05-28: 10.4 mg via ORAL
  Filled 2019-05-28: qty 2.5

## 2019-05-28 MED ORDER — ACETAMINOPHEN 325 MG PO TABS
650.0000 mg | ORAL_TABLET | Freq: Once | ORAL | Status: AC
  Administered 2019-05-28: 650 mg via ORAL
  Filled 2019-05-28: qty 2

## 2019-05-28 NOTE — Discharge Instructions (Addendum)
Your laboratory results are within normal limits. You chest xray was normal. I have provided a referral for the [facility] for you to establish care.  I have also provided medication that should help with your symptoms.  If you experience any shortness of breath, chest pain, fever or worsening symptoms please return to the emergency department.

## 2019-07-03 ENCOUNTER — Emergency Department (HOSPITAL_COMMUNITY)
Admission: EM | Admit: 2019-07-03 | Discharge: 2019-07-03 | Disposition: A | Discharge status: Home / Self Care | Payer: Medicaid Other | Attending: Emergency Medicine | Admitting: Emergency Medicine

## 2019-07-03 ENCOUNTER — Encounter (HOSPITAL_COMMUNITY): Payer: Self-pay | Admitting: Emergency Medicine

## 2019-07-03 ENCOUNTER — Other Ambulatory Visit: Payer: Self-pay

## 2019-07-03 DIAGNOSIS — Z79899 Other long term (current) drug therapy: Secondary | ICD-10-CM | POA: Insufficient documentation

## 2019-07-03 DIAGNOSIS — R42 Dizziness and giddiness: Secondary | ICD-10-CM | POA: Insufficient documentation

## 2019-07-03 LAB — CBC WITH DIFFERENTIAL/PLATELET
Abs Immature Granulocytes: 0.01 10*3/uL (ref 0.00–0.07)
Basophils Absolute: 0 10*3/uL (ref 0.0–0.1)
Basophils Relative: 1 %
Eosinophils Absolute: 0.1 10*3/uL (ref 0.0–0.5)
Eosinophils Relative: 1 %
HCT: 38.7 % (ref 36.0–46.0)
Hemoglobin: 12.3 g/dL (ref 12.0–15.0)
Immature Granulocytes: 0 %
Lymphocytes Relative: 40 %
Lymphs Abs: 3.1 10*3/uL (ref 0.7–4.0)
MCH: 28.4 pg (ref 26.0–34.0)
MCHC: 31.8 g/dL (ref 30.0–36.0)
MCV: 89.4 fL (ref 80.0–100.0)
Monocytes Absolute: 0.7 10*3/uL (ref 0.1–1.0)
Monocytes Relative: 9 %
Neutro Abs: 3.8 10*3/uL (ref 1.7–7.7)
Neutrophils Relative %: 49 %
Platelets: 316 10*3/uL (ref 150–400)
RBC: 4.33 MIL/uL (ref 3.87–5.11)
RDW: 12.5 % (ref 11.5–15.5)
WBC: 7.7 10*3/uL (ref 4.0–10.5)
nRBC: 0 % (ref 0.0–0.2)

## 2019-07-03 LAB — URINALYSIS, ROUTINE W REFLEX MICROSCOPIC
Bilirubin Urine: NEGATIVE
Glucose, UA: NEGATIVE mg/dL
Hgb urine dipstick: NEGATIVE
Ketones, ur: NEGATIVE mg/dL
Leukocytes,Ua: NEGATIVE
Nitrite: NEGATIVE
Protein, ur: NEGATIVE mg/dL
Specific Gravity, Urine: 1.009 (ref 1.005–1.030)
pH: 7 (ref 5.0–8.0)

## 2019-07-03 LAB — BASIC METABOLIC PANEL
Anion gap: 9 (ref 5–15)
BUN: 6 mg/dL (ref 6–20)
CO2: 22 mmol/L (ref 22–32)
Calcium: 9.7 mg/dL (ref 8.9–10.3)
Chloride: 109 mmol/L (ref 98–111)
Creatinine, Ser: 0.68 mg/dL (ref 0.44–1.00)
GFR calc Af Amer: >60 mL/min (ref >60)
GFR calc non Af Amer: >60 mL/min (ref >60)
Glucose, Bld: 94 mg/dL (ref 70–99)
Potassium: 3.5 mmol/L (ref 3.5–5.1)
Sodium: 140 mmol/L (ref 135–145)

## 2019-07-03 LAB — PREGNANCY, URINE: Preg Test, Ur: NEGATIVE

## 2019-07-03 NOTE — ED Provider Notes (Signed)
[facility]-EMERGENCY DEPT Provider Note   CSN: [id number] Arrival date & time: 07/03/19  1711     History   Chief Complaint Chief Complaint  Patient presents with  . Dizziness    HPI Katherine Lindsey is a 33 y.o. female.     HPI   33 year old female with dizziness.  Noticed while getting ready for work today.  Vague sensation of uneasiness and "butterflies in my stomach."  Denies pain per se.  No shortness of breath.  She went to work on symptoms persisted throughout the day.  She has not noticed anything that specifically makes her symptoms better or worse.  Eating and drinking.  No nausea or vomiting.  No urinary complaints.  No fevers or chills.  Denies vertigo.  Past Medical History:  Diagnosis Date  . Crohn's disease in remission [facility])    Hasn't had symptoms since 2004  . Gallstones     Patient Active Problem List   Diagnosis Date Noted  . High risk HPV infection 02/07/2017  . VBAC, delivered 03/19/2016  . History of low transverse cesarean section 11/28/2015  . Hammertoe 09/21/2014    Past Surgical History:  Procedure Laterality Date  . CESAREAN SECTION N/A 06/16/2013   Procedure: CESAREAN SECTION;  Surgeon: Reva Bores, MD;  Location: [facility] ORS;  Service: Obstetrics;  Laterality: N/A;  . CHOLECYSTECTOMY  08/28/2012   Procedure: LAPAROSCOPIC CHOLECYSTECTOMY WITH INTRAOPERATIVE CHOLANGIOGRAM;  Surgeon: [name], DO;  Location: [facility] OR;  Service: General;  Laterality: N/A;  . INDUCED ABORTION  02/2012     OB History    Gravida  4   Para  3   Term  3   Preterm      AB  1   Living  3     SAB  0   TAB  1   Ectopic      Multiple  0   Live Births  3        Obstetric Comments  2014 LTCS with extension of hysterotomy inferiorly         Home Medications    Prior to Admission medications   Medication Sig Start Date End Date Taking? Authorizing Provider  famotidine (PEPCID) 20 MG tablet Take 1 tablet (20 mg total) by  mouth 2 (two) times daily for 7 days. Patient taking differently: Take 20 mg by mouth daily as needed for heartburn.  05/28/19 07/03/19 Yes Soto, Johana, PA-C  ibuprofen (ADVIL,MOTRIN) 200 MG tablet Take 400 mg by mouth every 6 (six) hours as needed for headache or moderate pain.   Yes [provider]  alum & mag hydroxide-simeth (MAALOX MAX) 400-400-40 MG/5ML suspension Take 5 mLs by mouth every 6 (six) hours as needed for indigestion. Patient not taking: Reported on 05/27/2019 04/28/19   Dietrich Pates, PA-C  HYDROcodone-acetaminophen (NORCO/VICODIN) 5-325 MG tablet Take 1 tablet by mouth every 6 (six) hours as needed for severe pain. Patient not taking: Reported on 02/25/2019 07/20/18   Cristina Gong, PA-C  medroxyPROGESTERone (DEPO-PROVERA) 150 MG/ML injection Inject 150 mg into the muscle every 3 (three) months.    [provider]    Family History Family History  Problem Relation Age of Onset  . Other Neg Hx   . Diabetes Neg Hx   . Heart disease Neg Hx   . Hyperlipidemia Neg Hx   . Hypertension Neg Hx   . Stroke Neg Hx     Social History Social History   Tobacco Use  .  Smoking status: Never Smoker  . Smokeless tobacco: Never Used  Substance Use Topics  . Alcohol use: Yes    Alcohol/week: 1.0 standard drinks    Types: 1 Glasses of wine per week  . Drug use: No     Allergies   Patient has no known allergies.   Review of Systems Review of Systems  All systems reviewed and negative, other than as noted in HPI.  Physical Exam Updated Vital Signs BP (!) 138/95 (BP Location: Left Arm)   Pulse 67   Temp 98.2 F (36.8 C) (Oral)   Resp 16   SpO2 100%   Physical Exam Vitals signs and nursing note reviewed.  Constitutional:      General: She is not in acute distress.    Appearance: She is well-developed.  HENT:     Head: Normocephalic and atraumatic.  Eyes:     General:        Right eye: No discharge.        Left eye: No discharge.      Conjunctiva/sclera: Conjunctivae normal.  Neck:     Musculoskeletal: Neck supple.  Cardiovascular:     Rate and Rhythm: Normal rate and regular rhythm.     Heart sounds: Normal heart sounds. No murmur. No friction rub. No gallop.   Pulmonary:     Effort: Pulmonary effort is normal. No respiratory distress.     Breath sounds: Normal breath sounds.  Abdominal:     General: There is no distension.     Palpations: Abdomen is soft.     Tenderness: There is no abdominal tenderness.  Musculoskeletal:        General: No tenderness.  Skin:    General: Skin is warm and dry.  Neurological:     Mental Status: She is alert.  Psychiatric:        Behavior: Behavior normal.        Thought Content: Thought content normal.      ED Treatments / Results  Labs (all labs ordered are listed, but only abnormal results are displayed) Labs Reviewed  CBC WITH DIFFERENTIAL/PLATELET  BASIC METABOLIC PANEL  PREGNANCY, URINE  URINALYSIS, ROUTINE W REFLEX MICROSCOPIC    EKG EKG Interpretation  Date/Time:  Friday July 03 2019 19:41:19 EDT Ventricular Rate:  67 PR Interval:    QRS Duration: 76 QT Interval:  409 QTC Calculation: 432 R Axis:   82 Text Interpretation:  Sinus rhythm Borderline T abnormalities, anterior leads Confirmed by Virgel Manifold [id number]) on 07/03/2019 7:56:45 PM   Radiology No results found.  Procedures Procedures (including critical care time)  Medications Ordered in ED Medications - No data to display   Initial Impression / Assessment and Plan / ED Course  I have reviewed the triage vital signs and the nursing notes.  Pertinent labs & imaging results that were available during my care of the patient were reviewed by me and considered in my medical decision making (see chart for details).  33 year old female with vague sensation of uneasiness/dizziness.  She is afebrile.  Generally well-appearing.  He dynamically stable.  Her exam is unremarkable.  EKG with no overt  concerning changes.  Will check basic labs and UA/pregnancy.  At this time I have a pretty low suspicion for emergent process.  May potentially be some component of anxiety.  ED work-up completely unremarkable.  She remains hemodynamically stable.  No new complaints.  Reassurance provided.  I doubt emergent process.  Return precautions were discussed.  Outpatient follow-up  as needed otherwise.  Final Clinical Impressions(s) / ED Diagnoses   Final diagnoses:  Dizziness    ED Discharge Orders    None       Raeford RazorKohut, Keegen Heffern, MD 07/04/19 1149

## 2019-07-03 NOTE — ED Notes (Signed)
EKG given to EDP,Kohut,MD., for review. 

## 2019-07-03 NOTE — ED Triage Notes (Signed)
Pt reports today while at work felt dizzy. reports eating and drinking normally. Denies n/v/d.

## 2019-07-08 ENCOUNTER — Ambulatory Visit: Payer: Medicaid Other

## 2019-07-09 ENCOUNTER — Telehealth: Payer: Self-pay | Admitting: Family Medicine

## 2019-07-09 NOTE — Telephone Encounter (Signed)
Attempted to call patient about her appointment on 7/24 @ 8:55. No answer, left voicemail instructing patient to wear a face mask for the entire appointment. Patient instructed that no visitors are allowed during the visit. Patient instructed not to attend the appointment if she has any symptoms and to give the office a call back to be rescheduled. Symptom list and office number left.

## 2019-07-10 ENCOUNTER — Ambulatory Visit (INDEPENDENT_AMBULATORY_CARE_PROVIDER_SITE_OTHER): Payer: Medicaid Other | Admitting: *Deleted

## 2019-07-10 ENCOUNTER — Other Ambulatory Visit: Payer: Self-pay

## 2019-07-10 DIAGNOSIS — Z3042 Encounter for surveillance of injectable contraceptive: Secondary | ICD-10-CM

## 2019-07-10 MED ORDER — MEDROXYPROGESTERONE ACETATE 150 MG/ML IM SUSP
150.0000 mg | Freq: Once | INTRAMUSCULAR | Status: AC
  Administered 2019-07-10: 150 mg via INTRAMUSCULAR

## 2019-07-10 NOTE — Progress Notes (Signed)
Patient seen and assessed by nursing staff during this encounter. I have reviewed the chart and agree with the documentation and plan.  Verita Schneiders, MD 07/10/2019 12:38 PM

## 2019-07-10 NOTE — Progress Notes (Signed)
Pt arrived for scheduled appointment and had her child with her. Pt was advised she could not remain in the office with child however we could give her injection with her in her car or she could reschedule. She chose to receive the injection today as offered. Depo Provera 150 mg administered IM and tolerated well. Next injection due 10/9-10/23.

## 2019-07-15 ENCOUNTER — Other Ambulatory Visit: Payer: Self-pay

## 2019-07-15 ENCOUNTER — Ambulatory Visit: Payer: Self-pay | Admitting: Family Medicine

## 2019-07-15 ENCOUNTER — Encounter: Payer: Self-pay | Admitting: Family Medicine

## 2019-07-15 DIAGNOSIS — F419 Anxiety disorder, unspecified: Secondary | ICD-10-CM

## 2019-07-15 DIAGNOSIS — L732 Hidradenitis suppurativa: Secondary | ICD-10-CM

## 2019-07-15 DIAGNOSIS — B977 Papillomavirus as the cause of diseases classified elsewhere: Secondary | ICD-10-CM

## 2019-07-15 DIAGNOSIS — M549 Dorsalgia, unspecified: Secondary | ICD-10-CM | POA: Insufficient documentation

## 2019-07-15 DIAGNOSIS — M545 Low back pain, unspecified: Secondary | ICD-10-CM

## 2019-07-15 MED ORDER — CLINDAMYCIN PHOSPHATE 1 % EX GEL: Freq: Two times a day (BID) | CUTANEOUS | 0 refills | Status: DC

## 2019-07-15 MED ORDER — HYDROXYZINE HCL 10 MG PO TABS: 10.0000 mg | ORAL_TABLET | Freq: Three times a day (TID) | ORAL | 0 refills | Status: DC | PRN

## 2019-07-15 NOTE — Assessment & Plan Note (Signed)
GAD 7 score of 5. PHQ9 score of 2.  patient would like resources in order to speak with a therapist.  She may be interested in coming here after we have our behavioral health services reactivated but in the meantime will forward note to our social worker who can provide patient with list of options. -We will also provide as needed Atarax for anxiety attacks that may help prevent patient going to the emergency room.  She states this only happens once every couple of weeks, hopeful that therapy will help her.  If this is unsuccessful we will discuss long-term medication such as SSRI at follow-up appointment in 3 months.

## 2019-07-15 NOTE — Assessment & Plan Note (Addendum)
Patient previously presented for colposcopy and 04/2018.  See plan from note at that visit below: Per Dr. Hulan Fray, pt does not need a colpo and will need to schedule for pap smear in one year with co-testing.  Patient will schedule Pap with cotesting with [place] as it has been 1 year

## 2019-07-15 NOTE — Patient Instructions (Signed)
Thank you for coming to see me today. It was a pleasure meeting you! Today we talked about:   For your anxiety I have sent in a prescription called Atarax that you may take as needed while we help set you up with a therapist.  I will reach out to our social worker who can set you up with resources for therapist.   For your back pain I suggest that you use a heating pad at night.  I have provided you a letter in order to be able to use a stool at your work.  For the bumps under your arm you may use the clindamycin gel twice daily for flareups.  Return to the office as discussed if they worsen or do not improve.  Please follow-up with me in 3 months for your Pap smear and Depo or sooner as needed.  If you have any questions or concerns, please do not hesitate to call the office at [phone].  Take Care,   Katherine Enrique Manganaro, DO  Hidradenitis Suppurativa Hidradenitis suppurativa is a long-term (chronic) skin disease. It is similar to a severe form of acne, but it affects areas of the body where acne would be unusual, especially areas of the body where skin rubs against skin and becomes moist. These include:  Underarms.  Groin.  Genital area.  Buttocks.  Upper thighs.  Breasts. Hidradenitis suppurativa may start out as small lumps or pimples caused by blocked sweat glands or hair follicles. Pimples may develop into deep sores that break open (rupture) and drain pus. Over time, affected areas of skin may thicken and become scarred. This condition is rare and does not spread from person to person (non-contagious). What are the causes? The exact cause of this condition is not known. It may be related to:  Female and female hormones.  An overactive disease-fighting system (immune system). The immune system may over-react to blocked hair follicles or sweat glands and cause swelling and pus-filled sores. What increases the risk? You are more likely to develop this condition if you:  Are  female.  Are 5511-32 years old.  Have a family history of hidradenitis suppurativa.  Have a personal history of acne.  Are overweight.  Smoke.  Take the medicine lithium. What are the signs or symptoms? The first symptoms are usually painful bumps in the skin, similar to pimples. The condition may get worse over time (progress), or it may only cause mild symptoms. If the disease progresses, symptoms may include:  Skin bumps getting bigger and growing deeper into the skin.  Bumps rupturing and draining pus.  Itchy, infected skin.  Skin getting thicker and scarred.  Tunnels under the skin (fistulas) where pus drains from a bump.  Pain during daily activities, such as pain during walking if your groin area is affected.  Emotional problems, such as stress or depression. This condition may affect your appearance and your ability or willingness to wear certain clothes or do certain activities. How is this diagnosed? This condition is diagnosed by a health care provider who specializes in skin diseases (dermatologist). You may be diagnosed based on:  Your symptoms and medical history.  A physical exam.  Testing a pus sample for infection.  Blood tests. How is this treated? Your treatment will depend on how severe your symptoms are. The same treatment will not work for everybody with this condition. You may need to try several treatments to find what works best for you. Treatment may include:  Cleaning and  bandaging (dressing) your wounds as needed.  Lifestyle changes, such as new skin care routines.  Taking medicines, such as: ? Antibiotics. ? Acne medicines. ? Medicines to reduce the activity of the immune system. ? A diabetes medicine (metformin). ? Birth control pills, for women. ? Steroids to reduce swelling and pain.  Working with a mental health care provider, if you experience emotional distress due to this condition. If you have severe symptoms that do not get  better with medicine, you may need surgery. Surgery may involve:  Using a laser to clear the skin and remove hair follicles.  Opening and draining deep sores.  Removing the areas of skin that are diseased and scarred. Follow these instructions at home: Medicines   Take over-the-counter and prescription medicines only as told by your health care provider.  If you were prescribed an antibiotic medicine, take it as told by your health care provider. Do not stop taking the antibiotic even if your condition improves. Skin care  If you have open wounds, cover them with a clean dressing as told by your health care provider. Keep wounds clean by washing them gently with soap and water when you bathe.  Do not shave the areas where you get hidradenitis suppurativa.  Do not wear deodorant.  Wear loose-fitting clothes.  Try to avoid getting overheated or sweaty. If you get sweaty or wet, change into clean, dry clothes as soon as you can.  To help relieve pain and itchiness, cover sore areas with a warm, clean washcloth (warm compress) for 5-10 minutes as often as needed.  If told by your health care provider, take a bleach bath twice a week: ? Fill your bathtub halfway with water. ? Pour in  cup of unscented household bleach. ? Soak in the tub for 5-10 minutes. ? Only soak from the neck down. Avoid water on your face and hair. ? Shower to rinse off the bleach from your skin. General instructions  Learn as much as you can about your disease so that you have an active role in your treatment. Work closely with your health care provider to find treatments that work for you.  If you are overweight, work with your health care provider to lose weight as recommended.  Do not use any products that contain nicotine or tobacco, such as cigarettes and e-cigarettes. If you need help quitting, ask your health care provider.  If you struggle with living with this condition, talk with your health care  provider or work with a mental health care provider as recommended.  Keep all follow-up visits as told by your health care provider. This is important. Where to find more information  Hidradenitis Suppurativa Foundation, Inc.: https://www.hs-foundation.org/ Contact a health care provider if you have:  A flare-up of hidradenitis suppurativa.  A fever or chills.  Trouble controlling your symptoms at home.  Trouble doing your daily activities because of your symptoms.  Trouble dealing with emotional problems related to your condition. Summary  Hidradenitis suppurativa is a long-term (chronic) skin disease. It is similar to a severe form of acne, but it affects areas of the body where acne would be unusual.  The first symptoms are usually painful bumps in the skin, similar to pimples. The condition may get worse over time (progress), or it may only cause mild symptoms.  If you have open wounds, cover them with a clean dressing as told by your health care provider. Keep wounds clean by washing them gently with soap and water  when you bathe.  Besides skin care, treatment may include medicines, laser treatment, and surgery. This information is not intended to replace advice given to you by your health care provider. Make sure you discuss any questions you have with your health care provider. Document Released: 07/17/2004 Document Revised: 12/11/2017 Document Reviewed: 12/11/2017 Elsevier Patient Education  2020 [employer].

## 2019-07-15 NOTE — Progress Notes (Signed)
Subjective:    Patient ID: Katherine Lindsey, female    DOB: 09/10/1986, 33 y.o.   MRN: [id number]   CC: Establish care  HPI: Back pain: -Patient reports that when she bends over she can have some sharp pain that happens on the sides of her back.  She reports that it improves with hot showers and while laying in a warm tub.  She states that she stands for 10 hours a day at her job at the [employer].  States that the pain is not every day.  She believes that it is musculature in nature as it improves with warm showers.  States that she does not interested in any medications.  However she would appreciate having a note for work stating that she may sit on a stool in order to relieve her pain. -Denies any numbness, weakness, bladder or bowel incontinence, trauma  Anxiety: -Patient reports that she has gone to the emergency room a few times due to her anxiety.  States that it has worsened since March. -She reports that she had anxiety while she was in college for a year and spoke with the therapist who helped to resolve her problems. -She states that she will get a feeling in the pit of her stomach worrying that something bad is going to happen to her.  She will occasionally have some chest tightness with this which is prompted her to go to the emergency room. -She reports that she had previously tried medication for this in 2008 however it made her very tired and she is not interested in that. -She states she would like to first talk with a therapist and then she may like to start talking about medications if her symptoms persist. -Patient does not think that COVID-19 has exacerbated her symptoms but she is unsure. -She states that she feels safe in her relationships.  She has 3 girls at home which she wants to be present for. -Denies any SI/HI, does report that she has decreased appetite and has lost a few pounds  Hidradenitis suppurativa -Patient has boils under her arms which have to be drained  from time to time.  She reports that she does not shave which has helped and she is also currently not having a flare.  However she is interested in learning more about this. -She denies any fevers, chills, drainage, redness, tenderness  ROS   Patient Active Problem List   Diagnosis Date Noted  . Anxiety 07/15/2019  . Back pain 07/15/2019  . Hidradenitis suppurativa 07/15/2019  . High risk HPV infection 02/07/2017     Family History  Problem Relation Age of Onset  . Other Neg Hx   . Diabetes Neg Hx   . Heart disease Neg Hx   . Hyperlipidemia Neg Hx   . Hypertension Neg Hx   . Stroke Neg Hx     Past Medical History:  Diagnosis Date  . Crohn's disease in remission [facility])    Hasn't had symptoms since 2004  . Gallstones   . Hammertoe 09/21/2014  . History of low transverse cesarean section 11/28/2015  . VBAC, delivered 03/19/2016    Social Hx: Denies tobacco use, alcohol use, or illicit drug use.  Objective:  BP 122/62   Pulse 68   Ht 5\' 7"  (1.702 m)   Wt 167 lb 8 oz (76 kg)   SpO2 98%   BMI 26.23 kg/m  Vitals and nursing note reviewed  General: NAD, pleasant Head: Atraumatic Neck: Supple Cardiac:  RRR, normal heart sounds, no murmurs Respiratory: CTAB, normal effort Abdomen: soft, nontender, nondistended. Bowel sounds present Extremities: no edema or cyanosis. WWP. MSK: normal gait Skin: warm and dry, no rashes noted; some swelling noted under bilateral armpits with no purulence, erythema or fluctuance noted Neuro: alert and oriented, no focal deficits Psych: Neatly groomed and appropriately dressed. Maintains good eye contact and is cooperative and attentive. Speech is normal volume and rate. Denies SI/ HI. Normal affect.  Assessment & Plan:    Anxiety GAD 7 score of 5. PHQ9 score of 2.  patient would like resources in order to speak with a therapist.  She may be interested in coming here after we have our behavioral health services reactivated but in the  meantime will forward note to our social worker who can provide patient with list of options. -We will also provide as needed Atarax for anxiety attacks that may help prevent patient going to the emergency room.  She states this only happens once every couple of weeks, hopeful that therapy will help her.  If this is unsuccessful we will discuss long-term medication such as SSRI at follow-up appointment in 3 months.  High risk HPV infection Patient previously presented for colposcopy and 04/2018.  See plan from note at that visit below: Per Dr. Hulan Fray, pt does not need a colpo and will need to schedule for pap smear in one year with co-testing.  Patient will schedule Pap with cotesting with [place] as it has been 1 year  Back pain Appears to be MSK in nature.  No red flags.  Patient advised to use heating pad with Tylenol or ibuprofen as needed along with stretches shown in office.  Also provided note for her to not be able to use a stool at work as they will not provide 1 without a doctor's note.  Hidradenitis suppurativa Previously requiring I&D.  Located in bilateral axilla.  Patient is a non-smoker and is within normal weight.  Patient is already not shaving and advised to continue to simply trim hairs and avoid shaving or waxing.  Provided with clindamycin gel for when she has a flareup and given return precautions to the office.  Advised that she may need surgical evaluation if problem persists or worsens.   [place] Giulietta Prokop, DO Family Medicine Resident, PGY-3

## 2019-07-15 NOTE — Assessment & Plan Note (Addendum)
Appears to be MSK in nature.  No red flags.  Patient advised to use heating pad with Tylenol or ibuprofen as needed along with stretches shown in office.  Also provided note for her to not be able to use a stool at work as they will not provide 1 without a doctor's note.

## 2019-07-15 NOTE — Assessment & Plan Note (Signed)
Previously requiring I&D.  Located in bilateral axilla.  Patient is a non-smoker and is within normal weight.  Patient is already not shaving and advised to continue to simply trim hairs and avoid shaving or waxing.  Provided with clindamycin gel for when she has a flareup and given return precautions to the office.  Advised that she may need surgical evaluation if problem persists or worsens.

## 2019-09-24 IMAGING — CR CHEST - 2 VIEW
2 series · 2 of 2 positions shown · non-contrast
Comparison: None.

CLINICAL DATA: Chest pain

EXAM:
CHEST - 2 VIEW

[w chest lat]
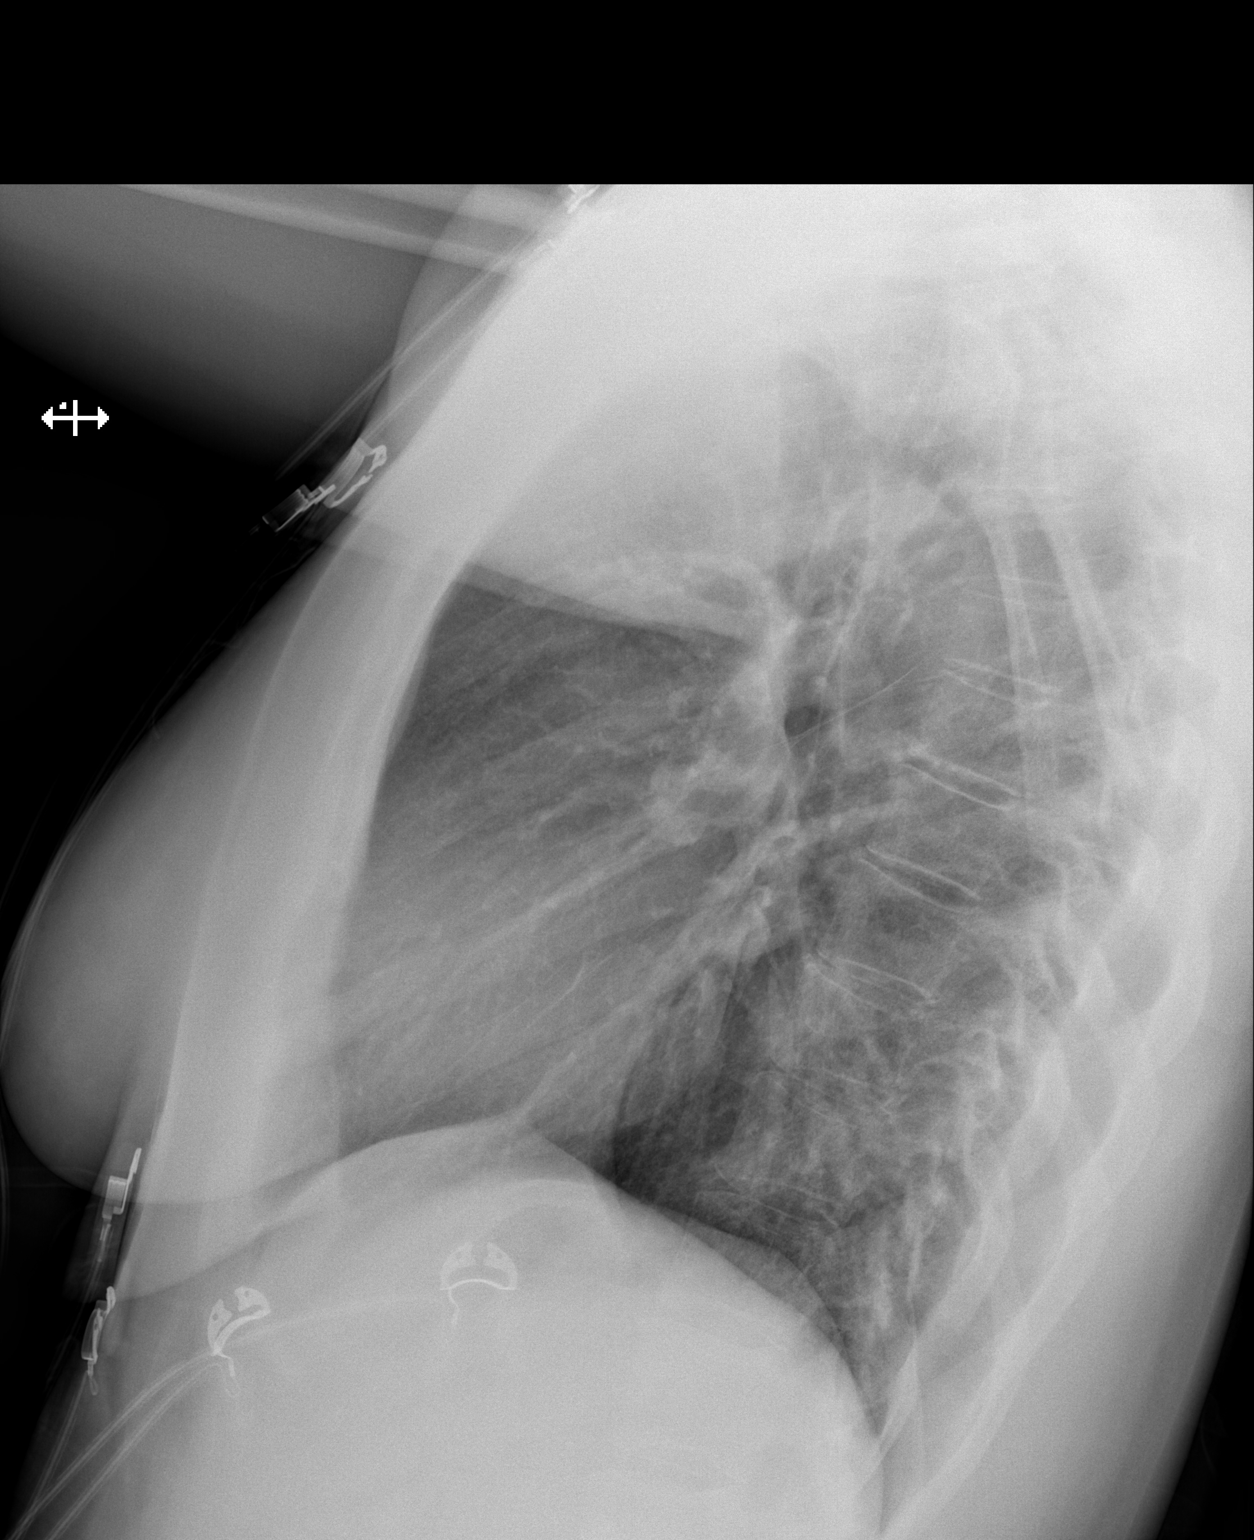

[w chest pa]
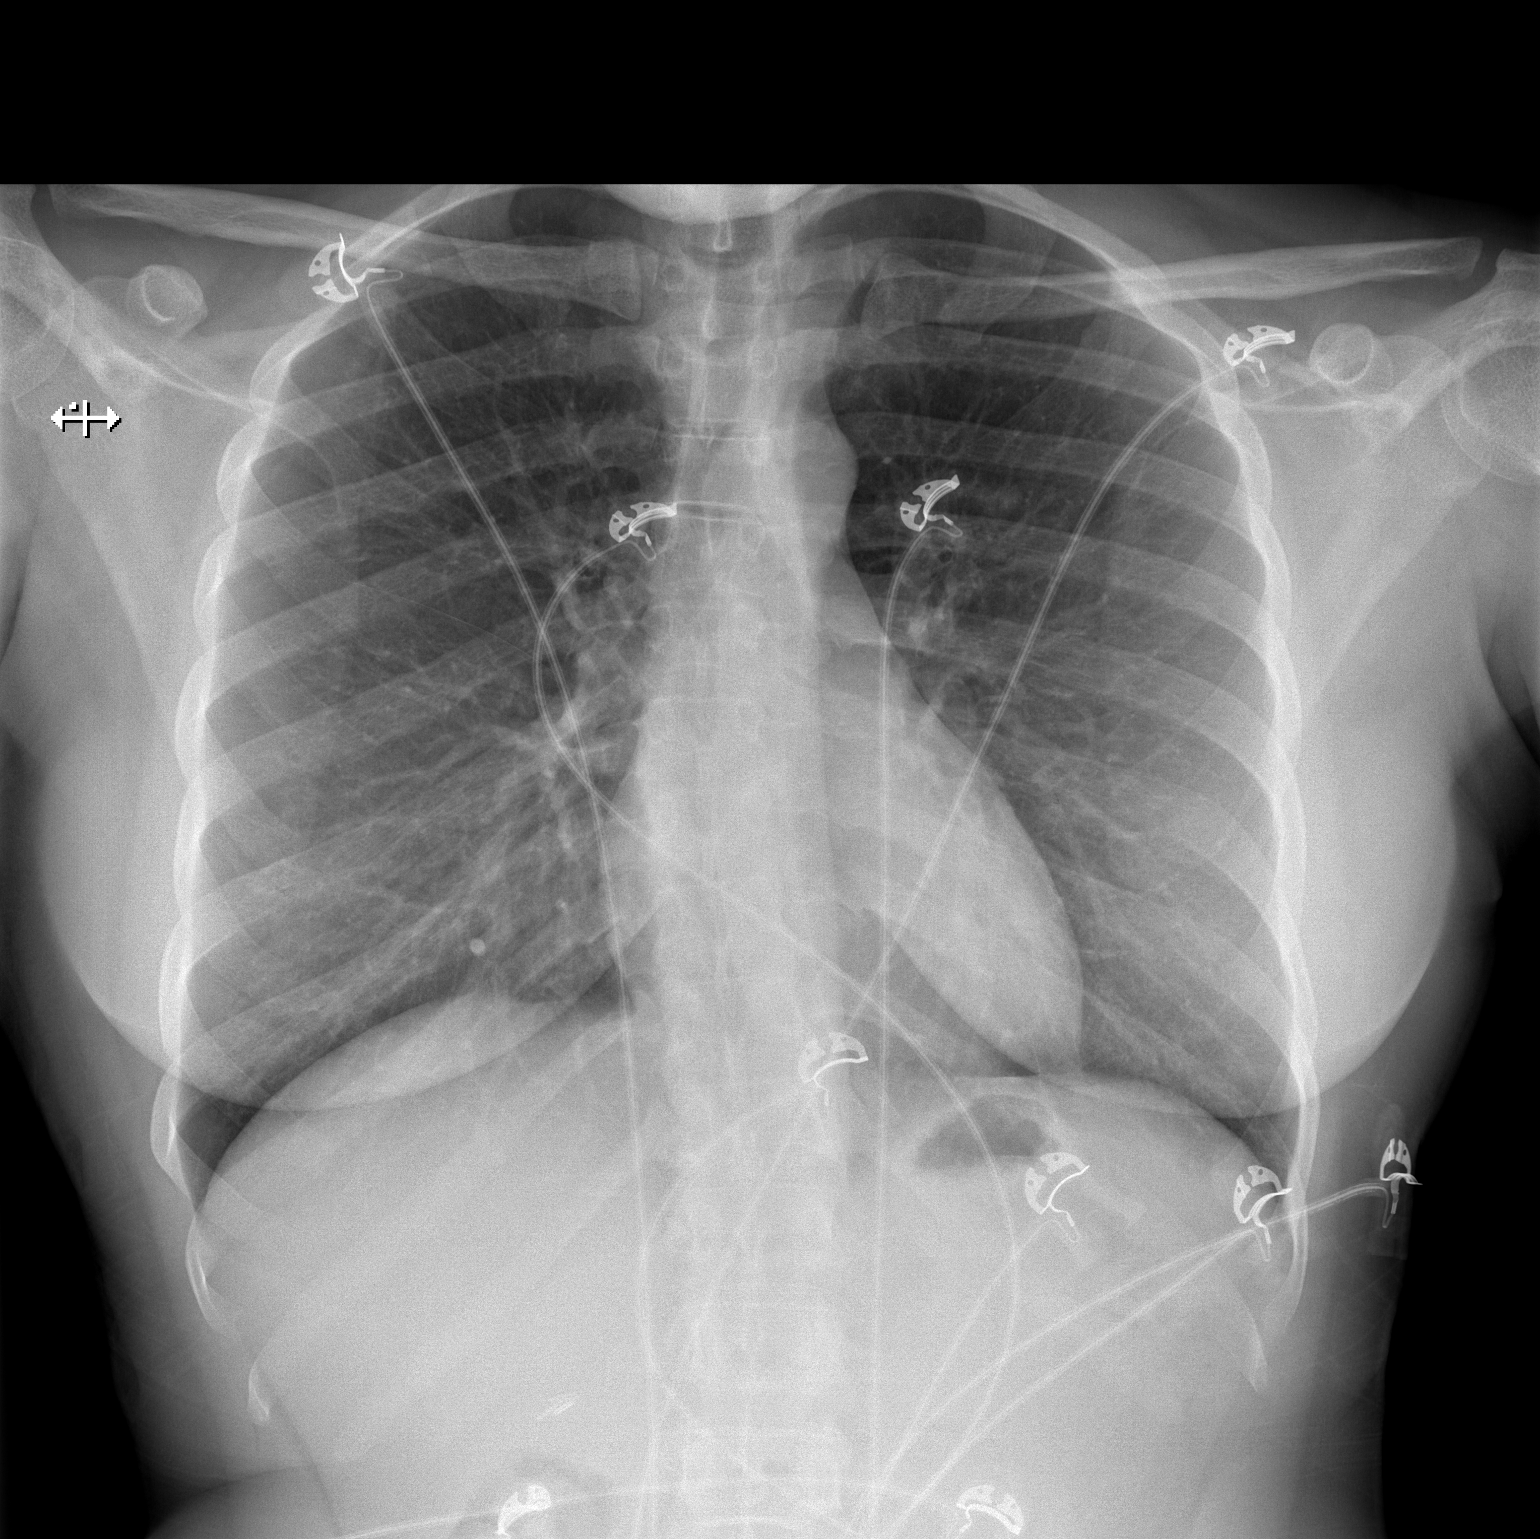

[2 of 2 positions shown; findings below may reference images not displayed]

FINDINGS: The heart size and mediastinal contours are within normal limits.
Both lungs are clear. The visualized skeletal structures are
unremarkable.
IMPRESSION: No active cardiopulmonary disease.

## 2019-09-25 ENCOUNTER — Telehealth: Payer: Self-pay | Admitting: *Deleted

## 2019-09-25 ENCOUNTER — Encounter (HOSPITAL_COMMUNITY): Payer: Self-pay

## 2019-09-25 ENCOUNTER — Emergency Department (HOSPITAL_COMMUNITY)
Admission: EM | Admit: 2019-09-25 | Discharge: 2019-09-25 | Disposition: A | Discharge status: Home / Self Care | Payer: Medicaid Other | Attending: Emergency Medicine | Admitting: Emergency Medicine

## 2019-09-25 ENCOUNTER — Other Ambulatory Visit: Payer: Self-pay

## 2019-09-25 ENCOUNTER — Ambulatory Visit (INDEPENDENT_AMBULATORY_CARE_PROVIDER_SITE_OTHER): Payer: Medicaid Other | Admitting: Lactation Services

## 2019-09-25 VITALS — Wt 167.3 lb

## 2019-09-25 DIAGNOSIS — R42 Dizziness and giddiness: Secondary | ICD-10-CM | POA: Insufficient documentation

## 2019-09-25 DIAGNOSIS — Z3042 Encounter for surveillance of injectable contraceptive: Secondary | ICD-10-CM

## 2019-09-25 DIAGNOSIS — R55 Syncope and collapse: Secondary | ICD-10-CM

## 2019-09-25 DIAGNOSIS — T50905A Adverse effect of unspecified drugs, medicaments and biological substances, initial encounter: Secondary | ICD-10-CM

## 2019-09-25 DIAGNOSIS — T385X5A Adverse effect of other estrogens and progestogens, initial encounter: Secondary | ICD-10-CM | POA: Insufficient documentation

## 2019-09-25 DIAGNOSIS — Y69 Unspecified misadventure during surgical and medical care: Secondary | ICD-10-CM | POA: Insufficient documentation

## 2019-09-25 DIAGNOSIS — R5383 Other fatigue: Secondary | ICD-10-CM | POA: Insufficient documentation

## 2019-09-25 DIAGNOSIS — T887XXA Unspecified adverse effect of drug or medicament, initial encounter: Secondary | ICD-10-CM | POA: Insufficient documentation

## 2019-09-25 DIAGNOSIS — Z79899 Other long term (current) drug therapy: Secondary | ICD-10-CM | POA: Insufficient documentation

## 2019-09-25 LAB — CBC WITH DIFFERENTIAL/PLATELET
Abs Immature Granulocytes: 0.03 10*3/uL (ref 0.00–0.07)
Basophils Absolute: 0 10*3/uL (ref 0.0–0.1)
Basophils Relative: 0 %
Eosinophils Absolute: 0 10*3/uL (ref 0.0–0.5)
Eosinophils Relative: 0 %
HCT: 41.2 % (ref 36.0–46.0)
Hemoglobin: 13.5 g/dL (ref 12.0–15.0)
Immature Granulocytes: 0 %
Lymphocytes Relative: 24 %
Lymphs Abs: 2.4 10*3/uL (ref 0.7–4.0)
MCH: 29.3 pg (ref 26.0–34.0)
MCHC: 32.8 g/dL (ref 30.0–36.0)
MCV: 89.4 fL (ref 80.0–100.0)
Monocytes Absolute: 0.5 10*3/uL (ref 0.1–1.0)
Monocytes Relative: 5 %
Neutro Abs: 7 10*3/uL (ref 1.7–7.7)
Neutrophils Relative %: 71 %
Platelets: 305 10*3/uL (ref 150–400)
RBC: 4.61 MIL/uL (ref 3.87–5.11)
RDW: 13.2 % (ref 11.5–15.5)
WBC: 10 10*3/uL (ref 4.0–10.5)
nRBC: 0 % (ref 0.0–0.2)

## 2019-09-25 LAB — BASIC METABOLIC PANEL
Anion gap: 10 (ref 5–15)
BUN: 8 mg/dL (ref 6–20)
CO2: 22 mmol/L (ref 22–32)
Calcium: 9.9 mg/dL (ref 8.9–10.3)
Chloride: 107 mmol/L (ref 98–111)
Creatinine, Ser: 0.69 mg/dL (ref 0.44–1.00)
GFR calc Af Amer: >60 mL/min (ref >60)
GFR calc non Af Amer: >60 mL/min (ref >60)
Glucose, Bld: 98 mg/dL (ref 70–99)
Potassium: 3.9 mmol/L (ref 3.5–5.1)
Sodium: 139 mmol/L (ref 135–145)

## 2019-09-25 MED ORDER — SODIUM CHLORIDE 0.9 % IV BOLUS
1000.0000 mL | Freq: Once | INTRAVENOUS | Status: AC
  Administered 2019-09-25: 1000 mL via INTRAVENOUS

## 2019-09-25 MED ORDER — MEDROXYPROGESTERONE ACETATE 150 MG/ML IM SUSP: 150.0000 mg | INTRAMUSCULAR | 0 refills | Status: DC

## 2019-09-25 MED ORDER — MEDROXYPROGESTERONE ACETATE 150 MG/ML IM SUSP
150.0000 mg | Freq: Once | INTRAMUSCULAR | Status: AC
  Administered 2019-09-25: 150 mg via INTRAMUSCULAR

## 2019-09-25 NOTE — ED Provider Notes (Signed)
[facility]-EMERGENCY DEPT Provider Note   CSN: [id number] Arrival date & time: 09/25/19  1011     History   Chief Complaint Chief Complaint  Patient presents with  . Shortness of Breath  . Fatigue  . Dizziness  . Medication Reaction    HPI Katherine Lindsey is a 33 y.o. female.     Patient is a 33 year old female with past medical history of Crohn's disease in remission, prior C-section.  She presents today for evaluation of dizziness and near syncope.  Patient states that she received a Depo injection at the [facility] this morning.  As she was leaving, she states she developed dizziness and feeling as if she was going to pass out.  She received this injection at approximately 930 this morning and has felt this way since.  She called the GYN office and was told to come to the ER for evaluation.  Patient denies any chest pain or difficulty breathing.  She denies any fevers or chills.  She denies any other recent illnesses or symptoms.  The history is provided by the patient.  Dizziness Quality:  Lightheadedness Severity:  Moderate Onset quality:  Sudden Duration:  3 hours Timing:  Constant Progression:  Unchanged Chronicity:  New Context comment:  After receiving Depo shot Relieved by:  Nothing Worsened by:  Nothing Ineffective treatments:  None tried   Past Medical History:  Diagnosis Date  . Crohn's disease in remission [facility])    Hasn't had symptoms since 2004  . Gallstones   . Hammertoe 09/21/2014  . History of low transverse cesarean section 11/28/2015  . VBAC, delivered 03/19/2016    Patient Active Problem List   Diagnosis Date Noted  . Anxiety 07/15/2019  . Back pain 07/15/2019  . Hidradenitis suppurativa 07/15/2019  . High risk HPV infection 02/07/2017    Past Surgical History:  Procedure Laterality Date  . CESAREAN SECTION N/A 06/16/2013   Procedure: CESAREAN SECTION;  Surgeon: Reva Bores, MD;  Location: [facility] ORS;  Service:  Obstetrics;  Laterality: N/A;  . CHOLECYSTECTOMY  08/28/2012   Procedure: LAPAROSCOPIC CHOLECYSTECTOMY WITH INTRAOPERATIVE CHOLANGIOGRAM;  Surgeon: [name], DO;  Location: [facility] OR;  Service: General;  Laterality: N/A;  . INDUCED ABORTION  02/2012     OB History    Gravida  4   Para  3   Term  3   Preterm      AB  1   Living  3     SAB  0   TAB  1   Ectopic      Multiple  0   Live Births  3        Obstetric Comments  2014 LTCS with extension of hysterotomy inferiorly         Home Medications    Prior to Admission medications   Medication Sig Start Date End Date Taking? Authorizing Provider  clindamycin (CLINDAGEL) 1 % gel Apply topically 2 (two) times daily. 07/15/19   Shirley, [place], DO  famotidine (PEPCID) 20 MG tablet Take 1 tablet (20 mg total) by mouth 2 (two) times daily for 7 days. Patient taking differently: Take 20 mg by mouth daily as needed for heartburn.  05/28/19 07/03/19  Claude Manges, PA-C  hydrOXYzine (ATARAX/VISTARIL) 10 MG tablet Take 1 tablet (10 mg total) by mouth 3 (three) times daily as needed for anxiety. 07/15/19   Shirley, [place], DO  ibuprofen (ADVIL,MOTRIN) 200 MG tablet Take 400 mg by mouth every 6 (six) hours  as needed for headache or moderate pain.    [provider]  medroxyPROGESTERone (DEPO-PROVERA) 150 MG/ML injection Inject 150 mg into the muscle every 3 (three) months.    [provider]    Family History Family History  Problem Relation Age of Onset  . Other Neg Hx   . Diabetes Neg Hx   . Heart disease Neg Hx   . Hyperlipidemia Neg Hx   . Hypertension Neg Hx   . Stroke Neg Hx     Social History Social History   Tobacco Use  . Smoking status: Never Smoker  . Smokeless tobacco: Never Used  Substance Use Topics  . Alcohol use: Yes    Alcohol/week: 1.0 standard drinks    Types: 1 Glasses of wine per week  . Drug use: No     Allergies   Patient has no known allergies.   Review of Systems  Review of Systems  Neurological: Positive for dizziness.  All other systems reviewed and are negative.    Physical Exam Updated Vital Signs BP 128/79   Pulse 70   Temp 98.9 F (37.2 C) (Oral)   Resp 14   Ht 5' 7.5" (1.715 m)   Wt 71.2 kg   SpO2 100%   BMI 24.23 kg/m   Physical Exam Vitals signs and nursing note reviewed.  Constitutional:      General: She is not in acute distress.    Appearance: She is well-developed. She is not diaphoretic.  HENT:     Head: Normocephalic and atraumatic.  Eyes:     Extraocular Movements: Extraocular movements intact.     Pupils: Pupils are equal, round, and reactive to light.  Neck:     Musculoskeletal: Normal range of motion and neck supple.  Cardiovascular:     Rate and Rhythm: Normal rate and regular rhythm.     Heart sounds: No murmur. No friction rub. No gallop.   Pulmonary:     Effort: Pulmonary effort is normal. No respiratory distress.     Breath sounds: Normal breath sounds. No wheezing.  Abdominal:     General: Bowel sounds are normal. There is no distension.     Palpations: Abdomen is soft.     Tenderness: There is no abdominal tenderness.  Musculoskeletal: Normal range of motion.  Skin:    General: Skin is warm and dry.  Neurological:     General: No focal deficit present.     Mental Status: She is alert and oriented to person, place, and time.     Cranial Nerves: No cranial nerve deficit.     Motor: No weakness.      ED Treatments / Results  Labs (all labs ordered are listed, but only abnormal results are displayed) Labs Reviewed  BASIC METABOLIC PANEL  CBC WITH DIFFERENTIAL/PLATELET    EKG None  Radiology No results found.  Procedures Procedures (including critical care time)  Medications Ordered in ED Medications  sodium chloride 0.9 % bolus 1,000 mL (has no administration in time range)     Initial Impression / Assessment and Plan / ED Course  I have reviewed the triage vital signs and the  nursing notes.  Pertinent labs & imaging results that were available during my care of the patient were reviewed by me and considered in my medical decision making (see chart for details).  Patient presenting here with complaints of dizziness that began after receiving a Depo injection.  Patient's vital signs are stable, physical examination is unremarkable,  and laboratory studies are reassuring.  Patient given IV fluids and observed for approximately 2 hours.  Patient appears clinically well and I believe appropriate for discharge.  Patient appears to have had some sort of reaction to this Depo shot, but nothing that appears significant.  Final Clinical Impressions(s) / ED Diagnoses   Final diagnoses:  None    ED Discharge Orders    None       Geoffery Lyons, MD 09/25/19 1358

## 2019-09-25 NOTE — Discharge Instructions (Addendum)
Drink plenty of fluids and get plenty of rest.  Return to the ER if symptoms significantly worsen or change.

## 2019-09-25 NOTE — Progress Notes (Addendum)
Katherine Lindsey here for Depo-Provera  Injection.  Injection administered without complication. Patient will return in 3 months for next injection. Pt did jump and tense when injection was given. She did report the shot was painful towards the end of the injection. No blood noted when pulled back on syringe and a dot of blood noted on arm after injection.     Donn Pierini, RN 09/25/2019  9:12 AM

## 2019-09-25 NOTE — ED Triage Notes (Addendum)
Patient states she feels SOB , dizziness, and fatigue after getting the depo shot at 0925 today.  Patient speaking in complete sentences. Sats normal.

## 2019-09-25 NOTE — Telephone Encounter (Signed)
Katherine Lindsey called office and asked to speak to a nurse. She reports she had a depoprovera shot today in our office and then started feeling dizziness, fatigue, sluggish. States she is at [facility] ER waiting to be seen. She asked if she got a different depo- provera than she usually gets. I informed her we may have a different manufacturer but it is the same medicine.  I can't confirm manufacturer.  We discussed that she has gotten depo-provera many times and has never felt this way. I confirmed that is not a typical complaint after depo- shot and we can't assume is from the shot- but if she feels she needs to be evaluated that the ER will take good care of her and make sure she is ok. She voices understanding. Rex Magee,RN

## 2019-10-20 ENCOUNTER — Telehealth: Payer: Self-pay | Admitting: Family Medicine

## 2019-10-20 NOTE — Telephone Encounter (Signed)
Patient informed and will get letter to her employer.  Jazmin Hartsell,CMA

## 2019-10-20 NOTE — Telephone Encounter (Signed)
Patient called stating she had a COVID test at [facility] and got her results back from them stating negative results, but her job needs a note stating that she is ok to return to work. Please give pt a call.

## 2019-10-20 NOTE — Telephone Encounter (Signed)
Will forward to MD.  Patient may need to provide proof of results. Jazmin Hartsell,CMA

## 2019-10-20 NOTE — Telephone Encounter (Signed)
Letter sent via My Chart.

## 2019-11-10 ENCOUNTER — Other Ambulatory Visit: Payer: Self-pay

## 2019-11-10 DIAGNOSIS — Z20822 Contact with and (suspected) exposure to covid-19: Secondary | ICD-10-CM

## 2019-11-12 LAB — NOVEL CORONAVIRUS, NAA: SARS-CoV-2, NAA: NOT DETECTED

## 2019-11-16 ENCOUNTER — Telehealth: Payer: Self-pay | Admitting: Family Medicine

## 2019-11-16 NOTE — Telephone Encounter (Signed)
Patient had neg Covid test and needs a doctors note stating she can return back to work. She would like for this letter to be sent to her MyChart. Please advise

## 2019-11-16 NOTE — Telephone Encounter (Signed)
Will forward to PCP.  Jazmin Hartsell,CMA  

## 2019-11-17 ENCOUNTER — Ambulatory Visit (INDEPENDENT_AMBULATORY_CARE_PROVIDER_SITE_OTHER): Payer: Self-pay | Admitting: Family Medicine

## 2019-11-17 ENCOUNTER — Other Ambulatory Visit: Payer: Self-pay

## 2019-11-17 ENCOUNTER — Encounter: Payer: Self-pay | Admitting: Family Medicine

## 2019-11-17 VITALS — BP 112/76 | HR 71

## 2019-11-17 DIAGNOSIS — L732 Hidradenitis suppurativa: Secondary | ICD-10-CM

## 2019-11-17 LAB — POCT URINE PREGNANCY: Preg Test, Ur: NEGATIVE

## 2019-11-17 MED ORDER — DOXYCYCLINE HYCLATE 100 MG PO TABS: 100.0000 mg | ORAL_TABLET | Freq: Every day | ORAL | 0 refills | Status: DC

## 2019-11-17 NOTE — Progress Notes (Signed)
    Subjective:  Katherine Lindsey is a 33 y.o. female who presents to the [facility] today with a chief complaint of bumps under her arm.   HPI: Hidradenitis suppurativa She reports new abscesses in her arms that have been developing for the past several weeks.  She notices bumps in both her right and left armpits.  Seem very similar to what she has experienced previously.  She did not have issues like this prior to 1 year ago.  In the past year, she has had recurrent bumps developing in her armpits which spontaneously erupt and drain.  Her left axillary abscess erupted yesterday.  She has been trying to keep it open by taking hot showers and keeps it covered with a gauze while dressed.  She wants to know if anything more can be done.'s been a particularly bothersome issue for her.  She has avoided wearing any shirts that show her arms and has avoided wearing bras for the past year because they irritate her armpits and lead to worsened abscess formation.  She otherwise feels well.  She denies fever, nausea, vomiting.  Chief Complaint noted Review of Symptoms - see HPI PMH -non-smoker.  Hidradenitis suppurativa starting 1 year ago   Objective:  Physical Exam: BP 112/76   Pulse 71   SpO2 98%   Breastfeeding No    Annual: No acute distress, seated comfortably in her exam chair. Skin: Left axilla with open, draining lesion along her anterior axillary line.  Only serous drainage noted.  Healthy appearing tissue visible at the opening of her eruption.  No significant erythema.  Larger indurated lump about 1 cm x 1 cm present along her posterior axilla. Right axilla with notable induration measuring about 1 cm x 1 cm along the mid axillary line.  Results for orders placed or performed in visit on 11/17/19 (from the past 72 hour(s))  POCT urine pregnancy     Status: None   Collection Time: 11/17/19  3:20 PM  Result Value Ref Range   Preg Test, Ur Negative Negative     Assessment/Plan:   Hidradenitis suppurativa Worsened in the past 1 to 2 weeks.  One lesion spontaneously draining in the left axilla.  She does not shave her armpits.  Non-smoker.  Weight within normal limits. -Pregnancy test negative -Doxycycline 100 mg daily for 1 month -Return in 1 month to ensure improvement

## 2019-11-17 NOTE — Patient Instructions (Addendum)
You have hidradenitis suppurativa.  There are number of different treatment options.  I think we should start with 1 month of an antibiotic called doxycycline.  This will help significantly reduce the inflammation in your armpits and treat any infection that may be present.  In addition to the antibiotics, please try to keep the abscess/cyst draining by taking hot steamy showers.  I think it is okay for you to to apply hydrogen peroxide after showering.  Please come back for [place] to assess in 1 month to see if this has been helpful.

## 2019-11-17 NOTE — Assessment & Plan Note (Signed)
Worsened in the past 1 to 2 weeks.  One lesion spontaneously draining in the left axilla.  She does not shave her armpits.  Non-smoker.  Weight within normal limits. -Pregnancy test negative -Doxycycline 100 mg daily for 1 month -Return in 1 month to ensure improvement

## 2019-11-17 NOTE — Telephone Encounter (Signed)
Note sent via MyChart.  

## 2019-12-16 ENCOUNTER — Other Ambulatory Visit: Payer: Self-pay

## 2019-12-16 ENCOUNTER — Ambulatory Visit (INDEPENDENT_AMBULATORY_CARE_PROVIDER_SITE_OTHER): Payer: Medicaid Other | Admitting: General Practice

## 2019-12-16 VITALS — BP 133/85 | HR 70 | Ht 68.0 in | Wt 173.0 lb

## 2019-12-16 DIAGNOSIS — Z3042 Encounter for surveillance of injectable contraceptive: Secondary | ICD-10-CM

## 2019-12-16 MED ORDER — MEDROXYPROGESTERONE ACETATE 150 MG/ML IM SUSP
150.0000 mg | Freq: Once | INTRAMUSCULAR | Status: AC
  Administered 2019-12-16: 08:00:00 150 mg via INTRAMUSCULAR

## 2019-12-16 NOTE — Progress Notes (Signed)
Katherine Lindsey here for Depo-Provera  Injection.  Injection administered without complication. Patient will return in 3 months for next injection.  Derinda Late, RN 12/16/2019  8:11 AM

## 2019-12-22 NOTE — Progress Notes (Signed)
Chart reviewed for nurse visit. Agree with plan of care.   Marylene Land, CNM 12/22/2019 7:26 PM

## 2020-01-06 ENCOUNTER — Ambulatory Visit (INDEPENDENT_AMBULATORY_CARE_PROVIDER_SITE_OTHER): Payer: BC Managed Care – PPO | Admitting: Family Medicine

## 2020-01-06 ENCOUNTER — Other Ambulatory Visit: Payer: Self-pay

## 2020-01-06 ENCOUNTER — Encounter: Payer: Self-pay | Admitting: Family Medicine

## 2020-01-06 VITALS — BP 122/72 | HR 75 | Wt 172.8 lb

## 2020-01-06 DIAGNOSIS — K029 Dental caries, unspecified: Secondary | ICD-10-CM

## 2020-01-06 DIAGNOSIS — R519 Headache, unspecified: Secondary | ICD-10-CM

## 2020-01-06 NOTE — Patient Instructions (Signed)
Today we talked for your headache, I think the 2 most likely causes are either your dental pain or a tension headache.  The main treatment for the dental pain is to go and get your tooth taken out like the dentist is told you to do.  Luckily it does not seem like you are having any emergency that is impacting your airway or ability to eat so you seem to be safe to follow-up with your dentist on an outpatient basis although you should do that now.  On the chance that this is just a simple tension headache the main treatment is to try to relax via meditation via reducing her caffeine intake, avoiding alcohol, avoiding stressful situations.  You can try ibuprofen or Tylenol or just avoiding light and loud noises when this is going on but these are usually self resolved.  If this does not improved over the next few weeks please reach back out to [place].  If you get any new concerning symptoms that seem like stroke or passing out please go straight to the emergency department.

## 2020-01-06 NOTE — Assessment & Plan Note (Signed)
Rear left lower tooth has dental caries patient says she was advised by her dentist that she needs to get it taken out.  There is no obvious systemic infection or asymmetry to mouth.  There is no respiratory distress or stridor, no pooling of drool, full range of motion in neck without pain still.  Discussed emergency precautions with patient and advised her to go to the dentist and get her tooth taken out.

## 2020-01-06 NOTE — Assessment & Plan Note (Signed)
7 of 10, centered on left side of face or patient also happens to have tooth her dentist said needs to be removed.  Katherine Lindsey is not painful to palpation, no eye involvement, no suspicion for temporal arteritis at this time.  Could be related to referred dental pain could be tension headache.  Advised that she can use ibuprofen or Tylenol but that one of the best potential resolutions would be actually going to the dentist and getting tooth taken out she was advised.

## 2020-01-06 NOTE — Progress Notes (Signed)
    Subjective:  Katherine Lindsey is a 34 y.o. female who presents to the [facility] today with a chief complaint of left-sided headache.   HPI: Nonintractable episodic headache 7 of 10, centered on left side of face where patient also happens to have tooth her dentist said needs to be removed.  Katherine Lindsey is not painful to palpation, no eye involvement,  Has not tried any medication for this, she says she finds that it shower often helps.  No lightheadedness, no stroke symptoms, no injuries, patient does not take any anticoagulants  Pain due to dental caries Rear left lower tooth has dental caries patient says she was advised by her dentist that she needs to get it taken out.  It hurts but she says that she does not have any physical trouble eating or breathing.  She is not any fevers  Objective:  Physical Exam: BP 122/72   Pulse 75   Wt 172 lb 12.8 oz (78.4 kg)   SpO2 100%   BMI 26.27 kg/m   Gen: NAD, conversing comfortably *Full range of motion to neck without pain, uvula is midline, there is no Ludwig angina, no tenderness to palpation of left temple, no asymmetry to face, no mastoiditis or ear involvement.  No stridor CV: RRR with no murmurs appreciated Pulm: NWOB, CTAB with no crackles, wheezes, or rhonchi GI: Normal bowel sounds present. Soft, Nontender, Nondistended. MSK: no edema, cyanosis, or clubbing noted Skin: warm, dry Neuro: grossly normal, moves all extremities Psych: Normal affect and thought content  No results found for this or any previous visit (from the past 72 hour(s)).   Assessment/Plan:  Nonintractable episodic headache 7 of 10, centered on left side of face or patient also happens to have tooth her dentist said needs to be removed.  Katherine Lindsey is not painful to palpation, no eye involvement, no suspicion for temporal arteritis at this time.  Could be related to referred dental pain could be tension headache.  Advised that she can use ibuprofen or Tylenol but that one  of the best potential resolutions would be actually going to the dentist and getting tooth taken out she was advised.  Pain due to dental caries Rear left lower tooth has dental caries patient says she was advised by her dentist that she needs to get it taken out.  There is no obvious systemic infection or asymmetry to mouth.  There is no respiratory distress or stridor, no pooling of drool, full range of motion in neck without pain still.  Discussed emergency precautions with patient and advised her to go to the dentist and get her tooth taken out.   Marthenia Rolling, DO FAMILY MEDICINE RESIDENT - PGY3 01/06/2020 8:14 PM

## 2020-01-27 ENCOUNTER — Encounter (HOSPITAL_COMMUNITY): Payer: Self-pay | Admitting: Emergency Medicine

## 2020-01-27 ENCOUNTER — Emergency Department (HOSPITAL_COMMUNITY)
Admission: EM | Admit: 2020-01-27 | Discharge: 2020-01-27 | Disposition: A | Discharge status: Home / Self Care | Payer: BC Managed Care – PPO | Attending: Emergency Medicine | Admitting: Emergency Medicine

## 2020-01-27 DIAGNOSIS — K0889 Other specified disorders of teeth and supporting structures: Secondary | ICD-10-CM | POA: Diagnosis not present

## 2020-01-27 DIAGNOSIS — Z79899 Other long term (current) drug therapy: Secondary | ICD-10-CM | POA: Insufficient documentation

## 2020-01-27 DIAGNOSIS — R519 Headache, unspecified: Secondary | ICD-10-CM | POA: Diagnosis not present

## 2020-01-27 MED ORDER — BUTALBITAL-APAP-CAFFEINE 50-325-40 MG PO TABS
2.0000 | ORAL_TABLET | ORAL | Status: AC
  Administered 2020-01-27: 2 via ORAL
  Filled 2020-01-27: qty 2

## 2020-01-27 MED ORDER — SUMATRIPTAN SUCCINATE 50 MG PO TABS: 50.0000 mg | ORAL_TABLET | ORAL | 0 refills | Status: DC | PRN

## 2020-01-27 NOTE — ED Triage Notes (Signed)
Pt reports a left sided headache that has been coming and going for over 1 week. Pt seen her pcp and was told it was most likely a tension. Pt has been using ibuprofen for pain but does not help. Pt has denies any change in vision.

## 2020-01-27 NOTE — Discharge Instructions (Addendum)
Please have your tooth accessed by your dentist--if you do not have a dentist I have given you a referral for one to see. If you currently have headaches after your dental procedure please follow-up with a neurologist I have given you a referral for them as well.  I have also prescribed you sumatriptan which you can take once if your headache begins and may take a second dose in 2 hours.

## 2020-01-27 NOTE — ED Provider Notes (Signed)
[facility] EMERGENCY DEPARTMENT Provider Note   CSN: [id number] Arrival date & time: 01/27/20  1054     History Chief Complaint  Patient presents with  . Headache    Katherine Lindsey is a 34 y.o. female.  HPI Patient is a 34 year old female with no significant past medical history presented for left-sided headache/temporal pain that is been intermittent for the past 2 months.  Patient states that though sensation is tight, intermittent, described as pressure/tightness seems to be better with cold compresses or warm showers.  She denies any numbness, weakness, lightheadedness or syncope.  She denies any vision changes or jaw claudication.  Denies any nausea or vomiting.  Denies any specific variation in symptoms based on time of day and states that sometimes it is better today than symptoms was at night.  Denies any worsening pain with sitting up or lying down.  Patient states that she was seen by family medicine doctor 1/20 and told to take ibuprofen.   She also has left lower dental pain from a broken tooth/cavity that she has had for approximately 1 month.  She states that it was present before that but did not feel like it was causing her pain until 1 month ago.  Patient denies any fevers, chills, difficulty swallowing or chewing.  She denies any sore throat nausea or vomiting.      Past Medical History:  Diagnosis Date  . Crohn's disease in remission [facility])    Hasn't had symptoms since 2004  . Gallstones   . Hammertoe 09/21/2014  . History of low transverse cesarean section 11/28/2015  . VBAC, delivered 03/19/2016    Patient Active Problem List   Diagnosis Date Noted  . Nonintractable episodic headache 01/06/2020  . Pain due to dental caries 01/06/2020  . Anxiety 07/15/2019  . Back pain 07/15/2019  . Hidradenitis suppurativa 07/15/2019  . High risk HPV infection 02/07/2017    Past Surgical History:  Procedure Laterality Date  . CESAREAN SECTION  N/A 06/16/2013   Procedure: CESAREAN SECTION;  Surgeon: Donnamae Jude, MD;  Location: [facility];  Service: Obstetrics;  Laterality: N/A;  . CHOLECYSTECTOMY  08/28/2012   Procedure: LAPAROSCOPIC CHOLECYSTECTOMY WITH INTRAOPERATIVE CHOLANGIOGRAM;  Surgeon: Madilyn Hook, DO;  Location: [facility];  Service: General;  Laterality: N/A;  . INDUCED ABORTION  02/2012     OB History    Gravida  4   Para  3   Term  3   Preterm      AB  1   Living  3     SAB  0   TAB  1   Ectopic      Multiple  0   Live Births  3        Obstetric Comments  2014 LTCS with extension of hysterotomy inferiorly        Family History  Problem Relation Age of Onset  . Other Neg Hx   . Diabetes Neg Hx   . Heart disease Neg Hx   . Hyperlipidemia Neg Hx   . Hypertension Neg Hx   . Stroke Neg Hx     Social History   Tobacco Use  . Smoking status: Never Smoker  . Smokeless tobacco: Never Used  Substance Use Topics  . Alcohol use: Yes    Alcohol/week: 1.0 standard drinks    Types: 1 Glasses of wine per week  . Drug use: No    Home Medications Prior to Admission medications   Medication  Sig Start Date End Date Taking? Authorizing Provider  clindamycin (CLINDAGEL) 1 % gel Apply topically 2 (two) times daily. 07/15/19   Shirley, [place], DO  doxycycline (VIBRA-TABS) 100 MG tablet Take 1 tablet (100 mg total) by mouth daily. 11/17/19   Mirian Mo, MD  famotidine (PEPCID) 20 MG tablet Take 1 tablet (20 mg total) by mouth 2 (two) times daily for 7 days. Patient taking differently: Take 20 mg by mouth daily as needed for heartburn.  05/28/19 07/03/19  Claude Manges, PA-C  hydrOXYzine (ATARAX/VISTARIL) 10 MG tablet Take 1 tablet (10 mg total) by mouth 3 (three) times daily as needed for anxiety. 07/15/19   Shirley, [place], DO  ibuprofen (ADVIL,MOTRIN) 200 MG tablet Take 400 mg by mouth every 6 (six) hours as needed for headache or moderate pain.    [provider]  medroxyPROGESTERone (DEPO-PROVERA) 150  MG/ML injection Inject 150 mg into the muscle every 3 (three) months.    [provider]  SUMAtriptan (IMITREX) 50 MG tablet Take 1 tablet (50 mg total) by mouth every 2 (two) hours as needed for migraine. May repeat in 2 hours if headache persists or recurs. 01/27/20   Gailen Shelter, PA    Allergies    Patient has no known allergies.  Review of Systems   Review of Systems  Constitutional: Negative for chills and fever.  HENT: Positive for dental problem. Negative for congestion.   Eyes: Negative for pain.  Respiratory: Negative for cough and shortness of breath.   Cardiovascular: Negative for chest pain and leg swelling.  Gastrointestinal: Negative for abdominal pain and vomiting.  Genitourinary: Negative for dysuria.  Musculoskeletal: Negative for myalgias.  Skin: Negative for rash.  Neurological: Positive for headaches. Negative for dizziness.    Physical Exam Updated Vital Signs BP 123/74 (BP Location: Right Arm)   Pulse 63   Temp 98.5 F (36.9 C) (Oral)   Resp 16   SpO2 98%   Physical Exam Vitals and nursing note reviewed.  Constitutional:      General: She is not in acute distress. HENT:     Head: Normocephalic and atraumatic.     Nose: Nose normal.     Mouth/Throat:     Mouth: Mucous membranes are moist.  Eyes:     General: No scleral icterus. Cardiovascular:     Rate and Rhythm: Normal rate and regular rhythm.     Pulses: Normal pulses.     Heart sounds: Normal heart sounds.  Pulmonary:     Effort: Pulmonary effort is normal. No respiratory distress.     Breath sounds: No wheezing.  Abdominal:     Palpations: Abdomen is soft.     Tenderness: There is no abdominal tenderness.  Musculoskeletal:     Cervical back: Normal range of motion.     Right lower leg: No edema.     Left lower leg: No edema.  Skin:    General: Skin is warm and dry.     Capillary Refill: Capillary refill takes less than 2 seconds.  Neurological:     Mental Status: She is  alert. Mental status is at baseline.  Psychiatric:        Mood and Affect: Mood normal.        Behavior: Behavior normal.     ED Results / Procedures / Treatments   Labs (all labs ordered are listed, but only abnormal results are displayed) Labs Reviewed - No data to display  EKG None  Radiology No results  found.  Procedures Procedures (including critical care time)  Medications Ordered in ED Medications  butalbital-acetaminophen-caffeine (FIORICET) 50-325-40 MG per tablet 2 tablet (2 tablets Oral Given 01/27/20 1246)    ED Course  I have reviewed the triage vital signs and the nursing notes.  Pertinent labs & imaging results that were available during my care of the patient were reviewed by me and considered in my medical decision making (see chart for details).    MDM Rules/Calculators/A&P                      Patient is well-appearing 34 year old female with no significant past medical history presented today for 2 months of headaches which appears to be more left-sided she also has a dental cavity which she is planning to have cold in the near future.  She has insurance and states that she will follow up with dentist.  She has 1 in my particularly but I will provide her with the information for Dr. Rocky Morel our on-call dentist.  She was seen by a primary care doctor recently who diagnosed her with tension migraines and recommended that she has been on ibuprofen.  She states that she has not had much.  Her physical exam is unremarkable today she does have a left lower dental cavity.  It is not tender to palpation of her mouth she has no pain or decreased motion sensitivity in her mouth.  She does not believe that her cavities causing your symptoms.  I have low suspicion for this as well.  She is neurologically intact and well-appearing.  She does not have any temporal symptoms to indicate tumor.  No indication that is a subarachnoid hemorrhage and sudden onset or worst headache of  her life.  She has a history of headaches in the past however this appears to be different.  As this is a new headache for her I recommended that she follows up with neurology.  I will provide her with a neuro referral and ordered ambulatory neurology referral.  She feels much improved after Fioricet.  I reevaluated patient after medication she seems much improved.  Is a tolerating p.o. without difficulty she continues to be neurologically intact.  The medical records were personally reviewed by myself. I personally reviewed all lab results and interpreted all imaging studies and either concurred with their official read or contacted radiology for clarification.   This patient appears reasonably screened and I doubt any other medical condition requiring further workup, evaluation, or treatment in the ED at this time prior to discharge.   Patient's vitals are WNL apart from vital sign abnormalities discussed above, patient is in NAD, and able to ambulate in the ED at their baseline and able to tolerate PO.  Pain has been managed or a plan has been made for home management and has no complaints prior to discharge. Patient is comfortable with above plan and for discharge at this time. All questions were answered prior to disposition. Results from the ER workup discussed with the patient face to face and all questions answered to the best of my ability. The patient is safe for discharge with strict return precautions. Patient appears safe for discharge with appropriate follow-up. Conveyed my impression with the patient and they voiced understanding and are agreeable to plan.   An After Visit Summary was printed and given to the patient.  Portions of this note were generated with Scientist, clinical (histocompatibility and immunogenetics). Dictation errors may occur despite best attempts at proofreading.  Final Clinical Impression(s) / ED Diagnoses Final diagnoses:  Bad headache    Rx / DC Orders ED Discharge Orders          Ordered    SUMAtriptan (IMITREX) 50 MG tablet  Every 2 hours PRN     01/27/20 1435    Ambulatory referral to Neurology    Comments: An appointment is requested in approximately: 4 weeks Patient has new onset of migraine-like headaches as left-sided ongoing for some time she has not had relief from facial medications to get some relief from Fioricet daily I prescribed her sumatriptan and recommend that she follow-up with neurology.   01/27/20 1442           Gailen Shelter, PA 01/27/20 1443    Terrilee Files, MD 01/27/20 [phone]

## 2020-02-01 ENCOUNTER — Telehealth: Payer: Self-pay | Admitting: Family Medicine

## 2020-02-01 NOTE — Telephone Encounter (Signed)
Please schedule patient for virtual visit to address giving a note to return to work. Thank you.

## 2020-02-01 NOTE — Telephone Encounter (Signed)
LM for patient to call back and schedule a virtual visit.  OK per DPR.  [facility] St[facility],CMA

## 2020-02-01 NOTE — Telephone Encounter (Signed)
Patient called stating that she needs a doctors note stating that she can return back to work concerning Covid. Please call patient if you have any questions.

## 2020-02-02 ENCOUNTER — Telehealth (INDEPENDENT_AMBULATORY_CARE_PROVIDER_SITE_OTHER): Payer: BC Managed Care – PPO | Admitting: Family Medicine

## 2020-02-02 DIAGNOSIS — R519 Headache, unspecified: Secondary | ICD-10-CM | POA: Diagnosis not present

## 2020-02-02 DIAGNOSIS — K029 Dental caries, unspecified: Secondary | ICD-10-CM

## 2020-02-02 NOTE — Assessment & Plan Note (Addendum)
Patient following up today to obtain a note to clear her to return to work after she was evaluated in the ED for a severe frontal headache that resolved with Fioricet. She has been having these intermittent frontal headaches that are relieved with OTC tylenol/ibuprofen. She has not required any Sumatriptan. She also has a dental cares that could be contributing. She is overall very well appearing on exam today. Work note provided. Tooth extraction scheduled for today. Patient instructed to follow up if headaches persist despite tooth removal for further evaluation. Patient has also been referred to neurology - recommended she make appointment at earliest convenience.  She understood and agreed to plan.

## 2020-02-02 NOTE — Progress Notes (Signed)
[facility] Telemedicine Visit  Patient consented to have virtual visit. Method of visit: Video  Encounter participants: Patient: Katherine Lindsey - located at Home Provider: Joana Reamer - located at [facility] Others (if applicable): Home  Chief Complaint: Headache Follow up  HPI: Non-intractable headache  Pain due to dental caries  Patient seen by Dr. Parke Simmers on 1/20 for left frontal headache and dental pain. Per chart review, feel headache is related to dental caries. She notes improvement in her headaches, takes tylenol/ibuprofen as needed which is helpful. She has a dentist appointment scheduled for today to have the tooth removed. She went to ED on 2/10 for similar headache. She notes she had more pressure that day and had never had that before and thus went to the ED. She was treated with Fioricet which resolved her headache. She was given Sumatriptin, but she notes she hasn't had to take it since she left the hospital. She was referred to neurologist which she plans to make an appointment with after her tooth is removed. She is presenting today for visit for a work note as her work is requiring she be cleared by a physician prior to returning. She notes they associate headaches with COVID thus would like letter from physician prior to returning. She denies any other COVID symptoms such as cough, congestion, SOB.  ROS: per HPI  Pertinent PMHx: Headache, dental caries   Exam:  Gen: Very well appearing Respiratory: Speaking in full sentences, breathing comfortably on room air  Assessment/Plan:  Nonintractable episodic headache Patient following up today to obtain a note to clear her to return to work after she was evaluated in the ED for a severe frontal headache that resolved with Fioricet. She has been having these intermittent frontal headaches that are relieved with OTC tylenol/ibuprofen. She has not required any Sumatriptan. She also has a dental cares  that could be contributing. She is overall very well appearing on exam today. Work note provided. Tooth extraction scheduled for today. Patient instructed to follow up if headaches persist despite tooth removal for further evaluation. Patient has also been referred to neurology - recommended she make appointment at earliest convenience.  She understood and agreed to plan.   Pain due to dental caries Patient to have tooth extracted today. Plan as above.     Time spent during visit with patient 16 minutes  Orpah Cobb, DO [facility], PGY2 02/02/20

## 2020-02-02 NOTE — Assessment & Plan Note (Signed)
Patient to have tooth extracted today. Plan as above.

## 2020-02-08 ENCOUNTER — Telehealth: Payer: Self-pay | Admitting: Family Medicine

## 2020-02-08 NOTE — Telephone Encounter (Signed)
Letter created for those dates and sent to patient. Fitzpatrick Alberico,CMA

## 2020-02-08 NOTE — Telephone Encounter (Signed)
Patient called back and needed a specific date to return.  Per patient she went back the next day 01/07/20.  Note changed and sent to her mychart.  Icy Fuhrmann,CMA

## 2020-02-08 NOTE — Telephone Encounter (Signed)
Pt is calling and would like to know if she could have a letter sent to her Earleen Reaper stating that she was seen in our clinic on 01/06/20 and that she was able to return to work. Pt needs letter for employer.

## 2020-03-03 ENCOUNTER — Ambulatory Visit: Payer: Medicaid Other

## 2020-03-15 ENCOUNTER — Other Ambulatory Visit: Payer: Self-pay

## 2020-03-15 ENCOUNTER — Ambulatory Visit (INDEPENDENT_AMBULATORY_CARE_PROVIDER_SITE_OTHER): Payer: BC Managed Care – PPO

## 2020-03-15 VITALS — BP 133/69 | HR 66 | Wt 172.7 lb

## 2020-03-15 DIAGNOSIS — Z3042 Encounter for surveillance of injectable contraceptive: Secondary | ICD-10-CM

## 2020-03-15 MED ORDER — MEDROXYPROGESTERONE ACETATE 150 MG/ML IM SUSP
150.0000 mg | Freq: Once | INTRAMUSCULAR | Status: AC
  Administered 2020-03-15: 150 mg via INTRAMUSCULAR

## 2020-03-15 NOTE — Progress Notes (Signed)
Patient seen and assessed by nursing staff during this encounter. I have reviewed the chart and agree with the documentation and plan.  Sahaj Bona, MD 03/15/2020 2:57 PM    

## 2020-03-15 NOTE — Progress Notes (Signed)
Debbe Bales here for Depo-Provera  Injection.  Injection administered without complication. Patient will return in 3 months for next injection.  Ralene Bathe, RN 03/15/2020  8:47 AM

## 2020-03-24 ENCOUNTER — Emergency Department (HOSPITAL_COMMUNITY)
Admission: EM | Admit: 2020-03-24 | Discharge: 2020-03-25 | Disposition: A | Discharge status: Home / Self Care | Payer: BC Managed Care – PPO | Attending: Emergency Medicine | Admitting: Emergency Medicine

## 2020-03-24 ENCOUNTER — Encounter (HOSPITAL_COMMUNITY): Payer: Self-pay

## 2020-03-24 ENCOUNTER — Other Ambulatory Visit: Payer: Self-pay

## 2020-03-24 DIAGNOSIS — R519 Headache, unspecified: Secondary | ICD-10-CM | POA: Insufficient documentation

## 2020-03-24 DIAGNOSIS — Z5321 Procedure and treatment not carried out due to patient leaving prior to being seen by health care provider: Secondary | ICD-10-CM | POA: Insufficient documentation

## 2020-03-24 NOTE — ED Triage Notes (Signed)
Pt arrives to ED w/ c/o head pressure x several months. Pt reports 9/10 pain.

## 2020-03-25 ENCOUNTER — Other Ambulatory Visit: Payer: Self-pay

## 2020-03-25 ENCOUNTER — Emergency Department (HOSPITAL_COMMUNITY)
Admission: EM | Admit: 2020-03-25 | Discharge: 2020-03-25 | Discharge status: Against Medical Advice | Payer: BC Managed Care – PPO | Attending: Emergency Medicine | Admitting: Emergency Medicine

## 2020-03-25 ENCOUNTER — Encounter (HOSPITAL_COMMUNITY): Payer: Self-pay

## 2020-03-25 DIAGNOSIS — R519 Headache, unspecified: Secondary | ICD-10-CM | POA: Insufficient documentation

## 2020-03-25 DIAGNOSIS — Z5321 Procedure and treatment not carried out due to patient leaving prior to being seen by health care provider: Secondary | ICD-10-CM | POA: Diagnosis not present

## 2020-03-25 NOTE — ED Triage Notes (Signed)
Headache for several months. Worse in temporal area.

## 2020-03-25 NOTE — ED Provider Notes (Signed)
Pt left from lobby prior to my evaluation   Rolan Bucco, MD 03/25/20 [phone]

## 2020-03-25 NOTE — ED Notes (Signed)
Pt gave registration labels and left facility

## 2020-03-25 NOTE — ED Notes (Signed)
Call patient back for a room and no one responded

## 2020-03-27 DIAGNOSIS — F419 Anxiety disorder, unspecified: Secondary | ICD-10-CM | POA: Diagnosis not present

## 2020-03-27 DIAGNOSIS — K0889 Other specified disorders of teeth and supporting structures: Secondary | ICD-10-CM | POA: Insufficient documentation

## 2020-03-27 DIAGNOSIS — R519 Headache, unspecified: Secondary | ICD-10-CM | POA: Diagnosis not present

## 2020-03-27 DIAGNOSIS — G44229 Chronic tension-type headache, not intractable: Secondary | ICD-10-CM | POA: Insufficient documentation

## 2020-03-27 DIAGNOSIS — G44209 Tension-type headache, unspecified, not intractable: Secondary | ICD-10-CM | POA: Diagnosis not present

## 2020-03-28 ENCOUNTER — Encounter (HOSPITAL_COMMUNITY): Payer: Self-pay

## 2020-03-28 ENCOUNTER — Other Ambulatory Visit: Payer: Self-pay

## 2020-03-28 ENCOUNTER — Emergency Department (HOSPITAL_COMMUNITY)
Admission: EM | Admit: 2020-03-28 | Discharge: 2020-03-28 | Disposition: A | Discharge status: Home / Self Care | Payer: BC Managed Care – PPO | Attending: Emergency Medicine | Admitting: Emergency Medicine

## 2020-03-28 DIAGNOSIS — F419 Anxiety disorder, unspecified: Secondary | ICD-10-CM

## 2020-03-28 DIAGNOSIS — G44229 Chronic tension-type headache, not intractable: Secondary | ICD-10-CM

## 2020-03-28 DIAGNOSIS — G44209 Tension-type headache, unspecified, not intractable: Secondary | ICD-10-CM | POA: Diagnosis not present

## 2020-03-28 MED ORDER — HYDROXYZINE HCL 25 MG PO TABS: 25.0000 mg | ORAL_TABLET | Freq: Three times a day (TID) | ORAL | 0 refills | Status: DC | PRN

## 2020-03-28 MED ORDER — KETOROLAC TROMETHAMINE 60 MG/2ML IM SOLN
60.0000 mg | Freq: Once | INTRAMUSCULAR | Status: AC
  Administered 2020-03-28: 07:00:00 60 mg via INTRAMUSCULAR
  Filled 2020-03-28: qty 2

## 2020-03-28 MED ORDER — DEXAMETHASONE SODIUM PHOSPHATE 10 MG/ML IJ SOLN
10.0000 mg | Freq: Once | INTRAMUSCULAR | Status: AC
  Administered 2020-03-28: 07:00:00 10 mg via INTRAMUSCULAR
  Filled 2020-03-28: qty 1

## 2020-03-28 MED ORDER — PROMETHAZINE HCL 25 MG PO TABS: 25.0000 mg | ORAL_TABLET | Freq: Three times a day (TID) | ORAL | 0 refills | Status: DC | PRN

## 2020-03-28 NOTE — ED Triage Notes (Signed)
Patient arrived with complaints of a dull headache since Friday night. Reports taking Ibuprofen today with little relief. Declines any LOC, vision changes, or nausea and vomiting. States she has started having some tingling in her arms and legs.

## 2020-03-28 NOTE — ED Provider Notes (Signed)
[facility]-EMERGENCY DEPT Provider Note   CSN: [id number] Arrival date & time: 03/27/20  2354     History Chief Complaint  Patient presents with  . Headache    Katherine Lindsey is a 34 y.o. female.  Patient presents to the emergency department for evaluation of headache.  She reports that she has been having daily dull headaches across the frontal area with occasional facial and tooth pain.  This is been ongoing for 4 months.  She has had problems similar to this in the past as well.  She has been taking ibuprofen for these headaches which improves it but then the headache comes back.  Patient reports that she did see a dentist initially and had some teeth extracted, was told there were no other dental problems.        Past Medical History:  Diagnosis Date  . Crohn's disease in remission [facility])    Hasn't had symptoms since 2004  . Gallstones   . Hammertoe 09/21/2014  . History of low transverse cesarean section 11/28/2015  . VBAC, delivered 03/19/2016    Patient Active Problem List   Diagnosis Date Noted  . Nonintractable episodic headache 01/06/2020  . Pain due to dental caries 01/06/2020  . Anxiety 07/15/2019  . Hidradenitis suppurativa 07/15/2019  . High risk HPV infection 02/07/2017    Past Surgical History:  Procedure Laterality Date  . CESAREAN SECTION N/A 06/16/2013   Procedure: CESAREAN SECTION;  Surgeon: Reva Bores, MD;  Location: [facility];  Service: Obstetrics;  Laterality: N/A;  . CHOLECYSTECTOMY  08/28/2012   Procedure: LAPAROSCOPIC CHOLECYSTECTOMY WITH INTRAOPERATIVE CHOLANGIOGRAM;  Surgeon: [name], DO;  Location: [facility];  Service: General;  Laterality: N/A;  . INDUCED ABORTION  02/2012     OB History    Gravida  4   Para  3   Term  3   Preterm      AB  1   Living  3     SAB  0   TAB  1   Ectopic      Multiple  0   Live Births  3        Obstetric Comments  2014 LTCS with extension of hysterotomy  inferiorly        Family History  Problem Relation Age of Onset  . Other Neg Hx   . Diabetes Neg Hx   . Heart disease Neg Hx   . Hyperlipidemia Neg Hx   . Hypertension Neg Hx   . Stroke Neg Hx     Social History   Tobacco Use  . Smoking status: Never Smoker  . Smokeless tobacco: Never Used  Substance Use Topics  . Alcohol use: Yes    Alcohol/week: 1.0 standard drinks    Types: 1 Glasses of wine per week  . Drug use: No    Home Medications Prior to Admission medications   Medication Sig Start Date End Date Taking? Authorizing Provider  clindamycin (CLINDAGEL) 1 % gel Apply topically 2 (two) times daily. 07/15/19   Shirley, [place], DO  doxycycline (VIBRA-TABS) 100 MG tablet Take 1 tablet (100 mg total) by mouth daily. 11/17/19   Mirian Mo, MD  famotidine (PEPCID) 20 MG tablet Take 1 tablet (20 mg total) by mouth 2 (two) times daily for 7 days. Patient taking differently: Take 20 mg by mouth daily as needed for heartburn.  05/28/19 07/03/19  Claude Manges, PA-C  hydrOXYzine (ATARAX/VISTARIL) 25 MG tablet Take 1 tablet (25 mg  total) by mouth every 8 (eight) hours as needed for anxiety. 03/28/20   Gilda Crease, MD  ibuprofen (ADVIL,MOTRIN) 200 MG tablet Take 400 mg by mouth every 6 (six) hours as needed for headache or moderate pain.    [provider]  medroxyPROGESTERone (DEPO-PROVERA) 150 MG/ML injection Inject 150 mg into the muscle every 3 (three) months.    [provider]  promethazine (PHENERGAN) 25 MG tablet Take 1 tablet (25 mg total) by mouth every 8 (eight) hours as needed (headache). 03/28/20   Gilda Crease, MD  SUMAtriptan (IMITREX) 50 MG tablet Take 1 tablet (50 mg total) by mouth every 2 (two) hours as needed for migraine. May repeat in 2 hours if headache persists or recurs. 01/27/20   Gailen Shelter, PA    Allergies    Patient has no known allergies.  Review of Systems   Review of Systems  Neurological: Positive for  headaches.  All other systems reviewed and are negative.   Physical Exam Updated Vital Signs BP 121/78   Pulse 62   Temp 98.5 F (36.9 C)   Resp 18   SpO2 100%   Physical Exam Vitals and nursing note reviewed.  Constitutional:      General: She is not in acute distress.    Appearance: Normal appearance. She is well-developed.  HENT:     Head: Normocephalic and atraumatic.     Right Ear: Hearing normal.     Left Ear: Hearing normal.     Nose: Nose normal.  Eyes:     Conjunctiva/sclera: Conjunctivae normal.     Pupils: Pupils are equal, round, and reactive to light.  Cardiovascular:     Rate and Rhythm: Regular rhythm.     Heart sounds: S1 normal and S2 normal. No murmur. No friction rub. No gallop.   Pulmonary:     Effort: Pulmonary effort is normal. No respiratory distress.     Breath sounds: Normal breath sounds.  Chest:     Chest wall: No tenderness.  Abdominal:     General: Bowel sounds are normal.     Palpations: Abdomen is soft.     Tenderness: There is no abdominal tenderness. There is no guarding or rebound. Negative signs include Murphy's sign and McBurney's sign.     Hernia: No hernia is present.  Musculoskeletal:        General: Normal range of motion.     Cervical back: Normal range of motion and neck supple.  Skin:    General: Skin is warm and dry.     Findings: No rash.  Neurological:     Mental Status: She is alert and oriented to person, place, and time.     GCS: GCS eye subscore is 4. GCS verbal subscore is 5. GCS motor subscore is 6.     Cranial Nerves: No cranial nerve deficit.     Sensory: No sensory deficit.     Coordination: Coordination normal.     Comments: Extraocular muscle movement: normal No visual field cut Pupils: equal and reactive both direct and consensual response is normal No nystagmus present    Sensory function is intact to light touch, pinprick Proprioception intact  Grip strength 5/5 symmetric in upper extremities No  pronator drift Normal finger to nose bilaterally  Lower extremity strength 5/5 against gravity Normal heel to shin bilaterally  Gait: normal   Psychiatric:        Speech: Speech normal.  Behavior: Behavior normal.        Thought Content: Thought content normal.     ED Results / Procedures / Treatments   Labs (all labs ordered are listed, but only abnormal results are displayed) Labs Reviewed - No data to display  EKG None  Radiology No results found.  Procedures Procedures (including critical care time)  Medications Ordered in ED Medications - No data to display  ED Course  I have reviewed the triage vital signs and the nursing notes.  Pertinent labs & imaging results that were available during my care of the patient were reviewed by me and considered in my medical decision making (see chart for details).    MDM Rules/Calculators/A&P                      Patient presents to the emergency department for evaluation of daily headaches for at least 4 months.  She has symptoms of a dull headache that is almost always there.  She has been taking a lot of ibuprofen.  This is perhaps analgesic rebound headaches.  She also, however, has a lot of pain across the left side of her face and into her mouth.  She has had previous dental extractions and repeat follow-up at the dentist revealed no further dental problems.  She was put on muscle relaxers for this but it has not helped.  This could perhaps be some form of neuralgia as well.  She has a normal neurologic exam.  She has been seen for headaches in the past, had a head CT a year ago that was normal.  I do not see indication for repeat CT at this time, will refer to neurology to determine if there is any neuro imaging necessary.  Patient reports that the headaches make her very anxious.  No homicidal or suicidal ideation.  Will prescribe Vistaril.  Final Clinical Impression(s) / ED Diagnoses Final diagnoses:  Chronic  tension-type headache, not intractable  Anxiety    Rx / DC Orders ED Discharge Orders         Ordered    hydrOXYzine (ATARAX/VISTARIL) 25 MG tablet  Every 8 hours PRN     03/28/20 0452    promethazine (PHENERGAN) 25 MG tablet  Every 8 hours PRN     03/28/20 0452           Orpah Greek, MD 03/28/20 [phone]

## 2020-03-28 NOTE — ED Notes (Signed)
Pt called x1 to reassess vitals

## 2020-03-29 ENCOUNTER — Encounter: Payer: Self-pay | Admitting: Neurology

## 2020-04-04 DIAGNOSIS — Z20828 Contact with and (suspected) exposure to other viral communicable diseases: Secondary | ICD-10-CM | POA: Diagnosis not present

## 2020-04-04 DIAGNOSIS — Z20822 Contact with and (suspected) exposure to covid-19: Secondary | ICD-10-CM | POA: Diagnosis not present

## 2020-04-06 ENCOUNTER — Ambulatory Visit: Payer: BC Managed Care – PPO

## 2020-04-14 ENCOUNTER — Other Ambulatory Visit: Payer: Self-pay

## 2020-04-14 ENCOUNTER — Telehealth (INDEPENDENT_AMBULATORY_CARE_PROVIDER_SITE_OTHER): Payer: BC Managed Care – PPO | Admitting: Family Medicine

## 2020-04-14 DIAGNOSIS — U071 COVID-19: Secondary | ICD-10-CM

## 2020-04-14 MED ORDER — BENZONATATE 100 MG PO CAPS: 100.0000 mg | ORAL_CAPSULE | Freq: Two times a day (BID) | ORAL | 0 refills | Status: DC | PRN

## 2020-04-14 NOTE — Progress Notes (Signed)
[place] [facility] Telemedicine Visit  Patient consented to have virtual visit. Method of visit: Telephone  Encounter participants: Patient: Katherine Lindsey - located at home Provider: [place] Thresa Dozier - located at [facility]  Others (if applicable): n/a  Chief Complaint: cough  HPI:  Patient diagnosed with COVID 4/20.  Patient reports that initially when she was sick with Covid she had 3 days of body aches.  She has not had any fevers.  She reports that she started feeling short of breath on Wednesday and Thursday night.  She called the ambulance on Thursday and she reports that they helped her significantly but told her she did not need to go to the hospital.  She reports that her O2 was at 98%.  They showed her some deep breathing exercises which helped her feel better.  Patient states she was unable to go to the hospital because she has 3 children who are sick at home with Covid as well.  She states that she has started feeling better over the past few days but is still having significant cough.  She denies feeling short of breath and is able to walk around her house without issue.  She does not have a pulse ox at home.  She reports she is having to sit up in recliner at night in order to sleep better.  She continues to deny any fevers, body aches.  She reports she feels as if there is chest congestion in her lungs.  She reports for the cough she has had cough drops, honey and tea as well as Mucinex and DayQuil.  She is worried that she may be developing pneumonia or some other serious side effects of Covid.  She reports that her children are mostly asymptomatic.  They are able to go back to school next Thursday.  She is also requesting if she could be written out of work until she feels better and her children are able to go back to school.  ROS: per HPI  Pertinent PMHx: Anxiety  Exam:  Respiratory: able to speak in complete sentences without issue, dry cough throughout  encounter  Assessment/Plan:  COVID-19 Diagnosed on 04/05/2020. Patient with no shortness of breath, fevers, chest pain and symptoms are improving.  Note provided today for patient to be out of work until next Thursday when her children are allowed to go back to school and hopes that she will also be symptom-free or greatly improved at that time.  - continue Tylenol/ Motrin, Mucinex and DayQuil as needed for discomfort; nasal saline to help with his nasal congestion - Use a cool mist humidifier at bedtime to help with breathing - Stressed hydration, Honey for cough -Counseled on wearing a mask, washing hands and avoiding social gatherings  -ED precautions discussed and patient expressed good understanding -Patient instructed to avoid others until they meet criteria for ending isolation after any suspected COVID, which are:  -24 hours with no fever (without use of medicaitons) and -respiratory symptoms have improved (e.g. cough, shortness of breath) or  -10 days since symptoms first appeared    Time spent during visit with patient: 22 minutes  [place] Deyjah Kindel, DO PGY-3, [facility]

## 2020-04-15 DIAGNOSIS — U071 COVID-19: Secondary | ICD-10-CM | POA: Insufficient documentation

## 2020-04-15 NOTE — Assessment & Plan Note (Addendum)
Diagnosed on 04/05/2020. Patient with no shortness of breath, fevers, chest pain and symptoms are improving.  Note provided today for patient to be out of work until next Thursday when her children are allowed to go back to school and hopes that she will also be symptom-free or greatly improved at that time.  - continue Tylenol/ Motrin, Mucinex and DayQuil as needed for discomfort; nasal saline to help with his nasal congestion - Use a cool mist humidifier at bedtime to help with breathing - Stressed hydration, Honey for cough -Counseled on wearing a mask, washing hands and avoiding social gatherings  -ED precautions discussed and patient expressed good understanding -Patient instructed to avoid others until they meet criteria for ending isolation after any suspected COVID, which are:  -24 hours with no fever (without use of medicaitons) and -respiratory symptoms have improved (e.g. cough, shortness of breath) or  -10 days since symptoms first appeared

## 2020-04-17 ENCOUNTER — Encounter (INDEPENDENT_AMBULATORY_CARE_PROVIDER_SITE_OTHER): Payer: Self-pay

## 2020-04-18 ENCOUNTER — Ambulatory Visit: Payer: BC Managed Care – PPO | Admitting: Podiatry

## 2020-05-31 NOTE — Progress Notes (Deleted)
NEUROLOGY CONSULTATION NOTE  Katherine Lindsey MRN: [id number] DOB: 15-Jul-1986  Referring provider: Solon Augusta, PA Primary care provider: [place] Shirley, DO  Reason for consult:  headaches  HISTORY OF PRESENT ILLNESS: Katherine Lindsey is a 34 year old ***-handed female with Crohn's disease in remission who presents for headaches.  History supplemented by family medicine and ED notes.  ***  She has had ED visits for her headaches, requiring treatment with headache cocktails.   CT head without contrast from 02/25/2019 personally reviewed was unremarkable.  Current NSAIDS:  ibuprofen Current analgesics:  *** Current triptans:  Sumatriptan 50mg  Current ergotamine:  none Current anti-emetic:  none Current muscle relaxants:  none Current anti-anxiolytic:  hydroxyzine Current sleep aide:  none Current Antihypertensive medications:  none Current Antidepressant medications:  none Current Anticonvulsant medications:  none Current anti-CGRP:  none Current Vitamins/Herbal/Supplements:  none Current Antihistamines/Decongestants:  none Other therapy:  *** Hormone/birth control:  Depo-Provera   Past NSAIDS:  *** Past analgesics:  *** Past abortive triptans:  *** Past abortive ergotamine:  *** Past muscle relaxants:  cyclobenzaprine Past anti-emetic:  Promethazine, ondansetron Past antihypertensive medications:  *** Past antidepressant medications:  *** Past anticonvulsant medications:  *** Past anti-CGRP:  *** Past vitamins/Herbal/Supplements:  *** Past antihistamines/decongestants:  *** Other past therapies:  ***  Caffeine:  *** Alcohol:  *** Smoker:  *** Diet:  *** Exercise:  *** Depression:  ***; Anxiety:  *** Other pain:  *** Sleep hygiene:  *** Family history of headache:  ***   PAST MEDICAL HISTORY: Past Medical History:  Diagnosis Date  . Crohn's disease in remission [facility])    Hasn't had symptoms since 2004  . Gallstones   . Hammertoe 09/21/2014    . History of low transverse cesarean section 11/28/2015  . VBAC, delivered 03/19/2016    PAST SURGICAL HISTORY: Past Surgical History:  Procedure Laterality Date  . CESAREAN SECTION N/A 06/16/2013   Procedure: CESAREAN SECTION;  Surgeon: 08/17/2013, MD;  Location: [facility];  Service: Obstetrics;  Laterality: N/A;  . CHOLECYSTECTOMY  08/28/2012   Procedure: LAPAROSCOPIC CHOLECYSTECTOMY WITH INTRAOPERATIVE CHOLANGIOGRAM;  Surgeon: 10/28/2012, DO;  Location: [facility] OR;  Service: General;  Laterality: N/A;  . INDUCED ABORTION  02/2012    MEDICATIONS: Current Outpatient Medications on File Prior to Visit  Medication Sig Dispense Refill  . benzonatate (TESSALON) 100 MG capsule Take 1 capsule (100 mg total) by mouth 2 (two) times daily as needed for cough. 20 capsule 0  . clindamycin (CLINDAGEL) 1 % gel Apply topically 2 (two) times daily. 75 mL 0  . doxycycline (VIBRA-TABS) 100 MG tablet Take 1 tablet (100 mg total) by mouth daily. 30 tablet 0  . famotidine (PEPCID) 20 MG tablet Take 1 tablet (20 mg total) by mouth 2 (two) times daily for 7 days. (Patient taking differently: Take 20 mg by mouth daily as needed for heartburn. ) 14 tablet 0  . hydrOXYzine (ATARAX/VISTARIL) 25 MG tablet Take 1 tablet (25 mg total) by mouth every 8 (eight) hours as needed for anxiety. 12 tablet 0  . ibuprofen (ADVIL,MOTRIN) 200 MG tablet Take 400 mg by mouth every 6 (six) hours as needed for headache or moderate pain.    . medroxyPROGESTERone (DEPO-PROVERA) 150 MG/ML injection Inject 150 mg into the muscle every 3 (three) months.    . promethazine (PHENERGAN) 25 MG tablet Take 1 tablet (25 mg total) by mouth every 8 (eight) hours as needed (headache). 30 tablet 0  . SUMAtriptan (IMITREX)  50 MG tablet Take 1 tablet (50 mg total) by mouth every 2 (two) hours as needed for migraine. May repeat in 2 hours if headache persists or recurs. 10 tablet 0   No current facility-administered medications on file prior to visit.     ALLERGIES: No Known Allergies  FAMILY HISTORY: Family History  Problem Relation Age of Onset  . Other Neg Hx   . Diabetes Neg Hx   . Heart disease Neg Hx   . Hyperlipidemia Neg Hx   . Hypertension Neg Hx   . Stroke Neg Hx    ***.  SOCIAL HISTORY: Social History   Socioeconomic History  . Marital status: Single    Spouse name: Not on file  . Number of children: Not on file  . Years of education: Not on file  . Highest education level: Not on file  Occupational History  . Not on file  Tobacco Use  . Smoking status: Never Smoker  . Smokeless tobacco: Never Used  Vaping Use  . Vaping Use: Never assessed  Substance and Sexual Activity  . Alcohol use: Yes    Alcohol/week: 1.0 standard drink    Types: 1 Glasses of wine per week  . Drug use: No  . Sexual activity: Yes  Other Topics Concern  . Not on file  Social History Narrative  . Not on file   Social Determinants of Health   Financial Resource Strain:   . Difficulty of Paying Living Expenses:   Food Insecurity:   . Worried About Programme researcher, broadcasting/film/video in the Last Year:   . Barista in the Last Year:   Transportation Needs:   . Freight forwarder (Medical):   Marland Kitchen Lack of Transportation (Non-Medical):   Physical Activity:   . Days of Exercise per Week:   . Minutes of Exercise per Session:   Stress:   . Feeling of Stress :   Social Connections:   . Frequency of Communication with Friends and Family:   . Frequency of Social Gatherings with Friends and Family:   . Attends Religious Services:   . Active Member of Clubs or Organizations:   . Attends Banker Meetings:   Marland Kitchen Marital Status:   Intimate Partner Violence:   . Fear of Current or Ex-Partner:   . Emotionally Abused:   Marland Kitchen Physically Abused:   . Sexually Abused:     REVIEW OF SYSTEMS: Constitutional: No fevers, chills, or sweats, no generalized fatigue, change in appetite Eyes: No visual changes, double vision, eye pain Ear,  nose and throat: No hearing loss, ear pain, nasal congestion, sore throat Cardiovascular: No chest pain, palpitations Respiratory:  No shortness of breath at rest or with exertion, wheezes GastrointestinaI: No nausea, vomiting, diarrhea, abdominal pain, fecal incontinence Genitourinary:  No dysuria, urinary retention or frequency Musculoskeletal:  No neck pain, back pain Integumentary: No rash, pruritus, skin lesions Neurological: as above Psychiatric: No depression, insomnia, anxiety Endocrine: No palpitations, fatigue, diaphoresis, mood swings, change in appetite, change in weight, increased thirst Hematologic/Lymphatic:  No purpura, petechiae. Allergic/Immunologic: no itchy/runny eyes, nasal congestion, recent allergic reactions, rashes  PHYSICAL EXAM: *** General: No acute distress.  Patient appears ***-groomed.  *** Head:  Normocephalic/atraumatic Eyes:  fundi examined but not visualized Neck: supple, no paraspinal tenderness, full range of motion Back: No paraspinal tenderness Heart: regular rate and rhythm Lungs: Clear to auscultation bilaterally. Vascular: No carotid bruits. Neurological Exam: Mental status: alert and oriented to person, place, and time,  recent and remote memory intact, fund of knowledge intact, attention and concentration intact, speech fluent and not dysarthric, language intact. Cranial nerves: CN I: not tested CN II: pupils equal, round and reactive to light, visual fields intact CN III, IV, VI:  full range of motion, no nystagmus, no ptosis CN V: facial sensation intact CN VII: upper and lower face symmetric CN VIII: hearing intact CN IX, X: gag intact, uvula midline CN XI: sternocleidomastoid and trapezius muscles intact CN XII: tongue midline Bulk & Tone: normal, no fasciculations. Motor:  5/5 throughout *** Sensation:  Pinprick *** temperature *** and vibration sensation intact.  ***. Deep Tendon Reflexes:  2+ throughout, *** toes downgoing.   *** Finger to nose testing:  Without dysmetria.  *** Heel to shin:  Without dysmetria.  *** Gait:  Normal station and stride.  Able to turn and tandem walk. Romberg ***.  IMPRESSION: ***  PLAN: ***  Thank you for allowing me to take part in the care of this patient.  Metta Clines, DO  CC: ***

## 2020-06-02 ENCOUNTER — Ambulatory Visit: Payer: BC Managed Care – PPO | Admitting: Neurology

## 2020-06-08 ENCOUNTER — Ambulatory Visit: Payer: BC Managed Care – PPO | Admitting: Obstetrics and Gynecology

## 2020-06-08 ENCOUNTER — Ambulatory Visit (INDEPENDENT_AMBULATORY_CARE_PROVIDER_SITE_OTHER): Payer: BC Managed Care – PPO | Admitting: Lactation Services

## 2020-06-08 ENCOUNTER — Other Ambulatory Visit: Payer: Self-pay

## 2020-06-08 VITALS — BP 126/86 | HR 67 | Ht 67.0 in | Wt 166.9 lb

## 2020-06-08 DIAGNOSIS — Z3042 Encounter for surveillance of injectable contraceptive: Secondary | ICD-10-CM

## 2020-06-08 MED ORDER — MEDROXYPROGESTERONE ACETATE 150 MG/ML IM SUSP
150.0000 mg | Freq: Once | INTRAMUSCULAR | Status: AC
  Administered 2020-06-08: 150 mg via INTRAMUSCULAR

## 2020-06-08 NOTE — Progress Notes (Signed)
Debbe Bales here for Depo-Provera  Injection.  Injection administered without complication. Patient will return in 3 months for next injection. Patient tolerated well.  Patient late for annual exam, has been rescheduled. She still wanted to have Depo today.   Ed Blalock, RN 06/08/2020  3:59 PM

## 2020-06-11 NOTE — Progress Notes (Signed)
I have reviewed the chart and agree with nursing staff's documentation of this patient's encounter.  Raelyn Mora, CNM 06/11/2020 9:34 AM

## 2020-07-14 ENCOUNTER — Other Ambulatory Visit: Payer: Self-pay

## 2020-07-14 ENCOUNTER — Ambulatory Visit (INDEPENDENT_AMBULATORY_CARE_PROVIDER_SITE_OTHER): Payer: BC Managed Care – PPO | Admitting: Family Medicine

## 2020-07-14 ENCOUNTER — Encounter: Payer: Self-pay | Admitting: Family Medicine

## 2020-07-14 VITALS — BP 102/60 | HR 79 | Wt 172.0 lb

## 2020-07-14 DIAGNOSIS — R42 Dizziness and giddiness: Secondary | ICD-10-CM | POA: Diagnosis not present

## 2020-07-14 DIAGNOSIS — F419 Anxiety disorder, unspecified: Secondary | ICD-10-CM

## 2020-07-14 DIAGNOSIS — L732 Hidradenitis suppurativa: Secondary | ICD-10-CM | POA: Diagnosis not present

## 2020-07-14 DIAGNOSIS — H8113 Benign paroxysmal vertigo, bilateral: Secondary | ICD-10-CM | POA: Diagnosis not present

## 2020-07-14 DIAGNOSIS — R519 Headache, unspecified: Secondary | ICD-10-CM

## 2020-07-14 DIAGNOSIS — H8110 Benign paroxysmal vertigo, unspecified ear: Secondary | ICD-10-CM | POA: Insufficient documentation

## 2020-07-14 NOTE — Assessment & Plan Note (Signed)
-  Patient not experiencing anxiety as often, continue coping mechanisms and utilizing strong support system

## 2020-07-14 NOTE — Patient Instructions (Addendum)
It was so nice meeting you today. Please do the below maneuver at least 3 times a day to help with your vertigo symptoms. Thank you for allowing me to be a part of your medical care. We will see you in a month, please contact us earlier  if you have any concerns that arise.   How to Perform the Epley Maneuver The Epley maneuver is an exercise that relieves symptoms of vertigo. Vertigo is the feeling that you or your surroundings are moving when they are not. When you feel vertigo, you may feel like the room is spinning and have trouble walking. Dizziness is a little different than vertigo. When you are dizzy, you may feel unsteady or light-headed. You can do this maneuver at home whenever you have symptoms of vertigo. You can do it up to 3 times a day until your symptoms go away. Even though the Epley maneuver may relieve your vertigo for a few weeks, it is possible that your symptoms will return. This maneuver relieves vertigo, but it does not relieve dizziness. What are the risks? If it is done correctly, the Epley maneuver is considered safe. Sometimes it can lead to dizziness or nausea that goes away after a short time. If you develop other symptoms, such as changes in vision, weakness, or numbness, stop doing the maneuver and call your health care provider. How to perform the Epley maneuver 1. Sit on the edge of a bed or table with your back straight and your legs extended or hanging over the edge of the bed or table. 2. Turn your head halfway toward the affected ear or side. 3. Lie backward quickly with your head turned until you are lying flat on your back. You may want to position a pillow under your shoulders. 4. Hold this position for 30 seconds. You may experience an attack of vertigo. This is normal. 5. Turn your head to the opposite direction until your unaffected ear is facing the floor. 6. Hold this position for 30 seconds. You may experience an attack of vertigo. This is normal. Hold  this position until the vertigo stops. 7. Turn your whole body to the same side as your head. Hold for another 30 seconds. 8. Sit back up. You can repeat this exercise up to 3 times a day. Follow these instructions at home:  After doing the Epley maneuver, you can return to your normal activities.  Ask your health care provider if there is anything you should do at home to prevent vertigo. He or she may recommend that you: ? Keep your head raised (elevated) with two or more pillows while you sleep. ? Do not sleep on the side of your affected ear. ? Get up slowly from bed. ? Avoid sudden movements during the day. ? Avoid extreme head movement, like looking up or bending over. Contact a health care provider if:  Your vertigo gets worse.  You have other symptoms, including: ? Nausea. ? Vomiting. ? Headache. Get help right away if:  You have vision changes.  You have a severe or worsening headache or neck pain.  You cannot stop vomiting.  You have new numbness or weakness in any part of your body. Summary  Vertigo is the feeling that you or your surroundings are moving when they are not.  The Epley maneuver is an exercise that relieves symptoms of vertigo.  If the Epley maneuver is done correctly, it is considered safe. You can do it up to 3 times a  day. This information is not intended to replace advice given to you by your health care provider. Make sure you discuss any questions you have with your health care provider. Document Revised: 11/15/2017 Document Reviewed: 10/23/2016 Elsevier Patient Education  2020 [employer].

## 2020-07-14 NOTE — Progress Notes (Signed)
    SUBJECTIVE:   CHIEF COMPLAINT / HPI:   Dizziness Patient complains of dizziness that has started 4-5 days ago. She describes it as intermittent. Does not occur when upon standing or moving positions. States it does not correlate with warm weather as she has been in the air conditioned environment every time it has occurred. Denies associated headache, nausea, vomiting and diarrhea. Admits to an occasional ringing sensation in her ears and sometimes feels like the room is spinning. Denies anything like this ever occurring prior to 4 days ago.   PERTINENT  PMH / PSH:   Headache Denies any current headache, reports significant improvement in the frequency of headaches. No longer taking the sumatriptan. When she does get a headache, still will take ibuprofen but reports that it occurs very infrequently.   Hidradenitis suppurativa States that this has improved, not getting as severe as it previously did. No longer requires to take the doxycycline.    Anxiety Not currently taking any medications. States that it has significantly improved since she has changed her outlook. Has a strong support system in place. No taking Atarax.   OBJECTIVE:   BP (!) 102/60   Pulse 79   Wt 172 lb (78 kg)   SpO2 98%   BMI 26.94 kg/m   General: Well-appearing, in no acute distress. Cardio: regular rate and rhythm, no murmurs or gallops noted Resp: lungs clear to ausculation bilaterally, no rhonchi or rales noted Abdomen: nontender, nondistended, active bowel sounds Neuro: CN2-12 grossly intact, 5/5 strength of UE and LE bilaterally,normal gait,  positive Dix-Hallpike maneuver  Psych: mood appropriate, denies any anxiety   ASSESSMENT/PLAN:   Vertigo, benign paroxysmal -Instructed patient on Epley's maneuver, 3x/day -Follow up in 1 month  -BMP and CBC to rule out less likely causes of anemia and hypoglycemia   Nonintractable episodic headache -Improved, no longer taking sumatriptan -Continue  taking ibuprofen as needed  Hidradenitis suppurativa -Continue to monitor, no medication needed at this time  Anxiety -Patient not experiencing anxiety as often, continue coping mechanisms and utilizing strong support system     Bob Daversa Robyne Peers, DO [facility]

## 2020-07-14 NOTE — Assessment & Plan Note (Signed)
-  Instructed patient on Epley's maneuver, 3x/day -Follow up in 1 month  -BMP and CBC to rule out less likely causes of anemia and hypoglycemia

## 2020-07-14 NOTE — Assessment & Plan Note (Signed)
-  Continue to monitor, no medication needed at this time

## 2020-07-14 NOTE — Assessment & Plan Note (Signed)
-  Improved, no longer taking sumatriptan -Continue taking ibuprofen as needed

## 2020-07-15 LAB — BASIC METABOLIC PANEL
BUN/Creatinine Ratio: 13 (ref 9–23)
BUN: 10 mg/dL (ref 6–20)
CO2: 23 mmol/L (ref 20–29)
Calcium: 10.1 mg/dL (ref 8.7–10.2)
Chloride: 103 mmol/L (ref 96–106)
Creatinine, Ser: 0.76 mg/dL (ref 0.57–1.00)
GFR calc Af Amer: 119 mL/min/{1.73_m2} (ref >59)
GFR calc non Af Amer: 103 mL/min/{1.73_m2} (ref >59)
Glucose: 75 mg/dL (ref 65–99)
Potassium: 4 mmol/L (ref 3.5–5.2)
Sodium: 140 mmol/L (ref 134–144)

## 2020-07-15 LAB — CBC WITH DIFFERENTIAL/PLATELET
Basophils Absolute: 0 10*3/uL (ref 0.0–0.2)
Basos: 1 %
EOS (ABSOLUTE): 0.1 10*3/uL (ref 0.0–0.4)
Eos: 2 %
Hematocrit: 38.9 % (ref 34.0–46.6)
Hemoglobin: 13.3 g/dL (ref 11.1–15.9)
Immature Grans (Abs): 0 10*3/uL (ref 0.0–0.1)
Immature Granulocytes: 0 %
Lymphocytes Absolute: 3 10*3/uL (ref 0.7–3.1)
Lymphs: 41 %
MCH: 30.3 pg (ref 26.6–33.0)
MCHC: 34.2 g/dL (ref 31.5–35.7)
MCV: 89 fL (ref 79–97)
Monocytes Absolute: 0.7 10*3/uL (ref 0.1–0.9)
Monocytes: 10 %
Neutrophils Absolute: 3.4 10*3/uL (ref 1.4–7.0)
Neutrophils: 46 %
Platelets: 284 10*3/uL (ref 150–450)
RBC: 4.39 x10E6/uL (ref 3.77–5.28)
RDW: 12.8 % (ref 11.7–15.4)
WBC: 7.3 10*3/uL (ref 3.4–10.8)

## 2020-08-11 ENCOUNTER — Ambulatory Visit: Payer: BC Managed Care – PPO

## 2020-08-11 ENCOUNTER — Emergency Department (HOSPITAL_COMMUNITY)
Admission: EM | Admit: 2020-08-11 | Discharge: 2020-08-11 | Disposition: A | Discharge status: Home / Self Care | Payer: BC Managed Care – PPO | Attending: Emergency Medicine | Admitting: Emergency Medicine

## 2020-08-11 ENCOUNTER — Emergency Department (HOSPITAL_COMMUNITY): Payer: BC Managed Care – PPO

## 2020-08-11 ENCOUNTER — Other Ambulatory Visit: Payer: Self-pay

## 2020-08-11 ENCOUNTER — Encounter (HOSPITAL_COMMUNITY): Payer: Self-pay

## 2020-08-11 DIAGNOSIS — R079 Chest pain, unspecified: Secondary | ICD-10-CM | POA: Diagnosis not present

## 2020-08-11 DIAGNOSIS — R0789 Other chest pain: Secondary | ICD-10-CM | POA: Diagnosis not present

## 2020-08-11 DIAGNOSIS — R0602 Shortness of breath: Secondary | ICD-10-CM | POA: Diagnosis not present

## 2020-08-11 DIAGNOSIS — Z5321 Procedure and treatment not carried out due to patient leaving prior to being seen by health care provider: Secondary | ICD-10-CM | POA: Diagnosis not present

## 2020-08-11 DIAGNOSIS — J90 Pleural effusion, not elsewhere classified: Secondary | ICD-10-CM | POA: Diagnosis not present

## 2020-08-11 LAB — BASIC METABOLIC PANEL
Anion gap: 14 (ref 5–15)
BUN: 7 mg/dL (ref 6–20)
CO2: 19 mmol/L — ABNORMAL LOW (ref 22–32)
Calcium: 9.6 mg/dL (ref 8.9–10.3)
Chloride: 107 mmol/L (ref 98–111)
Creatinine, Ser: 0.8 mg/dL (ref 0.44–1.00)
GFR calc Af Amer: >60 mL/min (ref >60)
GFR calc non Af Amer: >60 mL/min (ref >60)
Glucose, Bld: 85 mg/dL (ref 70–99)
Potassium: 3.6 mmol/L (ref 3.5–5.1)
Sodium: 140 mmol/L (ref 135–145)

## 2020-08-11 LAB — CBC
HCT: 40.5 % (ref 36.0–46.0)
Hemoglobin: 13.2 g/dL (ref 12.0–15.0)
MCH: 29.1 pg (ref 26.0–34.0)
MCHC: 32.6 g/dL (ref 30.0–36.0)
MCV: 89.2 fL (ref 80.0–100.0)
Platelets: 282 10*3/uL (ref 150–400)
RBC: 4.54 MIL/uL (ref 3.87–5.11)
RDW: 12 % (ref 11.5–15.5)
WBC: 8.8 10*3/uL (ref 4.0–10.5)
nRBC: 0 % (ref 0.0–0.2)

## 2020-08-11 LAB — I-STAT BETA HCG BLOOD, ED (MC, WL, AP ONLY): I-stat hCG, quantitative: <5 m[IU]/mL (ref <5)

## 2020-08-11 LAB — TROPONIN I (HIGH SENSITIVITY)
Troponin I (High Sensitivity): <2 ng/L (ref <18)
Troponin I (High Sensitivity): <2 ng/L (ref <18)

## 2020-08-11 NOTE — ED Notes (Signed)
Pt stated was leaving and could see results on MyChart. This NT notified pt if anything changes to do not hesitate and come back to be seen. Pt acknowledges.

## 2020-08-11 NOTE — ED Triage Notes (Signed)
Pt presents with left side chest pain radiating to her right side x1 day, woke this am feeling SOB

## 2020-08-16 ENCOUNTER — Encounter (HOSPITAL_COMMUNITY): Payer: Self-pay | Admitting: Emergency Medicine

## 2020-08-16 ENCOUNTER — Other Ambulatory Visit: Payer: Self-pay

## 2020-08-16 ENCOUNTER — Ambulatory Visit (HOSPITAL_COMMUNITY)
Admission: EM | Admit: 2020-08-16 | Discharge: 2020-08-16 | Disposition: A | Discharge status: Home / Self Care | Payer: BC Managed Care – PPO | Attending: Family Medicine | Admitting: Family Medicine

## 2020-08-16 DIAGNOSIS — L02411 Cutaneous abscess of right axilla: Secondary | ICD-10-CM

## 2020-08-16 DIAGNOSIS — R0789 Other chest pain: Secondary | ICD-10-CM | POA: Diagnosis not present

## 2020-08-16 DIAGNOSIS — L732 Hidradenitis suppurativa: Secondary | ICD-10-CM

## 2020-08-16 MED ORDER — NAPROXEN 500 MG PO TABS: 500.0000 mg | ORAL_TABLET | Freq: Two times a day (BID) | ORAL | 0 refills | Status: DC

## 2020-08-16 MED ORDER — SULFAMETHOXAZOLE-TRIMETHOPRIM 800-160 MG PO TABS: 1.0000 | ORAL_TABLET | Freq: Two times a day (BID) | ORAL | 0 refills | Status: AC

## 2020-08-16 NOTE — ED Triage Notes (Signed)
Chest, back pain for a week.  Went to [facility], reports having tests done, but did not see a provider.    Patient has a large lump under right axilla, anterior.  Lump is hard, tender.  Reports having a smaller area under left axilla.   Chest pain is anterior to swollen areas under arms.  Pain in back is across shoulders bilaterally.   Denies nausea, vomiting  Sometimes, patient feels anxiety is triggering sob, history of the same.

## 2020-08-16 NOTE — Discharge Instructions (Signed)
Take antibiotic 2 times a day.  Take 2 doses today. Take naproxen 2 times a day with food.  This is an anti-inflammatory to help with the chest pain See your doctor in follow-up  If you continue to have spells of rapid heartbeat (tachycardia) ask your doctor about a Holter monitor

## 2020-08-16 NOTE — ED Provider Notes (Signed)
[facility]    CSN: [id number] Arrival date & time: 08/16/20  0827      History   Chief Complaint Chief Complaint  Patient presents with   Chest Pain    HPI Katherine Lindsey is a 34 y.o. female.   HPI  Patient has been having anterior chest pain for about a week.  It goes through to her back.  Is worse with movement.  Worse with deep breath.  No exertional chest pain.  No radiation.  No dizziness or shortness of breath. Patient has known hypertension, diabetes, hyperlipidemia, known heart disease, or heart disease in her family. She went to the emergency room earlier in the week.  Lab work was done including 2 troponins, chest x-ray, and EKG.  All of these are reviewed and are normal.  She did not wait to be seen She also has a diagnosis of hidradenitis suppurativa.  Chronic abscesses.  Both of her axillae have painful lumps right now right greater than left.  No fever Patient has no GI distress.  No history of heartburn or ulcers.  No acid reflux  Past Medical History:  Diagnosis Date   Crohn's disease in remission [facility])    Hasn't had symptoms since 2004   Gallstones    Hammertoe 09/21/2014   History of low transverse cesarean section 11/28/2015   VBAC, delivered 03/19/2016    Patient Active Problem List   Diagnosis Date Noted   Vertigo, benign paroxysmal 07/14/2020   COVID-19 04/15/2020   Nonintractable episodic headache 01/06/2020   Anxiety 07/15/2019   Hidradenitis suppurativa 07/15/2019   High risk HPV infection 02/07/2017    Past Surgical History:  Procedure Laterality Date   CESAREAN SECTION N/A 06/16/2013   Procedure: CESAREAN SECTION;  Surgeon: Reva Bores, MD;  Location: [facility];  Service: Obstetrics;  Laterality: N/A;   CHOLECYSTECTOMY  08/28/2012   Procedure: LAPAROSCOPIC CHOLECYSTECTOMY WITH INTRAOPERATIVE CHOLANGIOGRAM;  Surgeon: [name], DO;  Location: [facility];  Service: General;  Laterality: N/A;   INDUCED ABORTION   02/2012    OB History    Gravida  4   Para  3   Term  3   Preterm      AB  1   Living  3     SAB  0   TAB  1   Ectopic      Multiple  0   Live Births  3        Obstetric Comments  2014 LTCS with extension of hysterotomy inferiorly         Home Medications    Prior to Admission medications   Medication Sig Start Date End Date Taking? Authorizing Provider  famotidine (PEPCID) 20 MG tablet Take 1 tablet (20 mg total) by mouth 2 (two) times daily for 7 days. Patient taking differently: Take 20 mg by mouth daily as needed for heartburn.  05/28/19 07/03/19  Claude Manges, PA-C  ibuprofen (ADVIL,MOTRIN) 200 MG tablet Take 400 mg by mouth every 6 (six) hours as needed for headache or moderate pain.    [provider]  medroxyPROGESTERone (DEPO-PROVERA) 150 MG/ML injection Inject 150 mg into the muscle every 3 (three) months.    [provider]  naproxen (NAPROSYN) 500 MG tablet Take 1 tablet (500 mg total) by mouth 2 (two) times daily. 08/16/20   Eustace Moore, MD  sulfamethoxazole-trimethoprim (BACTRIM DS) 800-160 MG tablet Take 1 tablet by mouth 2 (two) times daily for 7 days. 08/16/20 08/23/20  Eustace Moore, MD  promethazine (PHENERGAN) 25 MG tablet Take 1 tablet (25 mg total) by mouth every 8 (eight) hours as needed (headache). Patient not taking: Reported on 07/14/2020 03/28/20 08/16/20  Gilda Crease, MD  SUMAtriptan (IMITREX) 50 MG tablet Take 1 tablet (50 mg total) by mouth every 2 (two) hours as needed for migraine. May repeat in 2 hours if headache persists or recurs. Patient not taking: Reported on 07/14/2020 01/27/20 08/16/20  Gailen Shelter, PA    Family History Family History  Problem Relation Age of Onset   Other Neg Hx    Diabetes Neg Hx    Heart disease Neg Hx    Hyperlipidemia Neg Hx    Hypertension Neg Hx    Stroke Neg Hx     Social History Social History   Tobacco Use   Smoking status: Never Smoker    Smokeless tobacco: Never Used  Building services engineer Use: Never assessed  Substance Use Topics   Alcohol use: Yes    Alcohol/week: 1.0 standard drink    Types: 1 Glasses of wine per week   Drug use: No     Allergies   Patient has no known allergies.   Review of Systems Review of Systems See HPI  Physical Exam Triage Vital Signs ED Triage Vitals  Enc Vitals Group     BP 08/16/20 0903 124/85     Pulse Rate 08/16/20 0903 73     Resp 08/16/20 0903 20     Temp 08/16/20 0903 98.2 F (36.8 C)     Temp Source 08/16/20 0903 Oral     SpO2 08/16/20 0903 100 %     Weight --      Height --      Head Circumference --      Peak Flow --      Pain Score 08/16/20 0901 7     Pain Loc --      Pain Edu? --      Excl. in GC? --    No data found.  Updated Vital Signs BP 124/85 (BP Location: Right Arm)    Pulse 73    Temp 98.2 F (36.8 C) (Oral)    Resp 20    SpO2 100%     Physical Exam Constitutional:      General: She is not in acute distress.    Appearance: She is well-developed and normal weight.  HENT:     Head: Normocephalic and atraumatic.     Nose:     Comments: Mask is in place Eyes:     Conjunctiva/sclera: Conjunctivae normal.     Pupils: Pupils are equal, round, and reactive to light.  Cardiovascular:     Rate and Rhythm: Normal rate and regular rhythm.     Heart sounds: No murmur heard.      Comments: Normal heart exam Pulmonary:     Effort: Pulmonary effort is normal. No respiratory distress.     Breath sounds: Normal breath sounds. No wheezing, rhonchi or rales.  Chest:     Chest wall: Tenderness present.  Abdominal:     General: Bowel sounds are normal. There is no distension.     Palpations: Abdomen is soft.     Tenderness: There is no abdominal tenderness.     Comments: Lungs are clear.  Soft abdomen no organomegaly or tenderness  Musculoskeletal:        General: Normal range of motion.     Cervical back:  Normal range of motion and neck supple.    Skin:    General: Skin is warm and dry.     Comments: Right axilla has numerous subcutaneous nodules that are very tender.  Left axilla has 2 similar nodules.  Both axilla are scarred  Neurological:     General: No focal deficit present.     Mental Status: She is alert.  Psychiatric:        Behavior: Behavior normal.      UC Treatments / Results  Labs (all labs ordered are listed, but only abnormal results are displayed) Labs Reviewed - No data to display  EKG   Radiology No results found.  Procedures Procedures (including critical care time)  Medications Ordered in UC Medications - No data to display  Initial Impression / Assessment and Plan / UC Course  I have reviewed the triage vital signs and the nursing notes.  Pertinent labs & imaging results that were available during my care of the patient were reviewed by me and considered in my medical decision making (see chart for details).     Reassured patient there is no evidence of heart disease.  Both the history, physical examination, and lab work support chest wall pain. Patient has recurring infections in axilla.  We will treat her with antibiotics and have her follow-up with her primary care Final Clinical Impressions(s) / UC Diagnoses   Final diagnoses:  Chest wall pain  Abscess of right axilla  Axillary hidradenitis suppurativa     Discharge Instructions     Take antibiotic 2 times a day.  Take 2 doses today. Take naproxen 2 times a day with food.  This is an anti-inflammatory to help with the chest pain See your doctor in follow-up  If you continue to have spells of rapid heartbeat (tachycardia) ask your doctor about a Holter monitor   ED Prescriptions    Medication Sig Dispense Auth. Provider   sulfamethoxazole-trimethoprim (BACTRIM DS) 800-160 MG tablet Take 1 tablet by mouth 2 (two) times daily for 7 days. 14 tablet Eustace Moore, MD   naproxen (NAPROSYN) 500 MG tablet Take 1 tablet (500 mg  total) by mouth 2 (two) times daily. 30 tablet Eustace Moore, MD     PDMP not reviewed this encounter.   Eustace Moore, MD 08/16/20 1055

## 2020-08-17 ENCOUNTER — Other Ambulatory Visit: Payer: Self-pay

## 2020-08-17 ENCOUNTER — Ambulatory Visit (INDEPENDENT_AMBULATORY_CARE_PROVIDER_SITE_OTHER): Payer: BC Managed Care – PPO | Admitting: Family Medicine

## 2020-08-17 VITALS — BP 106/60 | HR 69 | Wt 169.6 lb

## 2020-08-17 DIAGNOSIS — R0789 Other chest pain: Secondary | ICD-10-CM | POA: Diagnosis not present

## 2020-08-17 DIAGNOSIS — L732 Hidradenitis suppurativa: Secondary | ICD-10-CM | POA: Diagnosis not present

## 2020-08-17 NOTE — Assessment & Plan Note (Addendum)
Acute.  Ongoing tender to palpation in the pectoralis muscle bilaterally extending into the armpit.  Seen in urgent care yesterday.  Negative troponins and normal EKG highly unlikely to be cardiac related.  Patient well-appearing in office today and heart sounds are normal.  Higher suspicion for tenderness and swelling precipitated by hidradenitis.  Should improve with antibiotics.  Given ibuprofen in urgent care. -Continue ibuprofen as needed for pain -Follow-up if symptoms persist or change beyond hidradenitis improvement

## 2020-08-17 NOTE — Assessment & Plan Note (Signed)
Acute flare.  Improving.  No drainage noted. -Continue Bactrim for 7-day course -Follow-up if no improvement

## 2020-08-17 NOTE — Progress Notes (Signed)
    SUBJECTIVE:   CHIEF COMPLAINT / HPI:   Arm Pit swelling Started 1 week ago. Has history of hidradenitis suppurativa.  Seen in urgent care yesterday given antibiotics.  Asked to follow-up.  Feels as if it is improving.  Chest Pain Endorsing bilateral chest pain that started the same time as the armpit swelling. Had some swelling in this region that patient feels like has decreased a little bit.  Not currently experiencing centralized chest pain or shortness of breath.  PERTINENT  PMH / PSH: Anxiety  OBJECTIVE:   BP 106/60   Pulse 69   Wt 169 lb 9.6 oz (76.9 kg)   SpO2 98%   BMI 26.56 kg/m   General: Appears well, no acute distress. Age appropriate. Cardiac: RRR, normal heart sounds, no murmurs. Chest tender to palpation anterior to axilla bilaterally Respiratory: CTAB, normal effort Extremities: No edema or cyanosis. Skin: Tender nodules in the axilla area       ASSESSMENT/PLAN:   Hidradenitis suppurativa Acute flare.  Improving.  No drainage noted. -Continue Bactrim for 7-day course -Follow-up if no improvement  Chest wall pain Acute.  Ongoing tender to palpation in the pectoralis muscle bilaterally extending into the armpit.  Seen in urgent care yesterday.  Negative troponins and normal EKG highly unlikely to be cardiac related.  Patient well-appearing in office today and heart sounds are normal.  Higher suspicion for tenderness and swelling precipitated by hidradenitis.  Should improve with antibiotics.  Given ibuprofen in urgent care. -Continue ibuprofen as needed for pain -Follow-up if symptoms persist or change beyond hidradenitis improvement   Lavonda Jumbo, DO [facility]

## 2020-08-17 NOTE — Patient Instructions (Signed)
It was nice to meet you today.  You were seen for hidradenitis suppurativa it appears to be improving.  Please continue antibiotics for full course.  Your bilateral chest muscle pain is likely due to the swelling and tenderness in your armpits.  Continue naproxen as prescribed.  If symptoms fail to improve with completion of antibiotic course and continued resolution of swelling please follow-up.  Dr. Salvadore Dom

## 2020-08-22 ENCOUNTER — Emergency Department (HOSPITAL_COMMUNITY)
Admission: EM | Admit: 2020-08-22 | Discharge: 2020-08-22 | Disposition: A | Discharge status: Home / Self Care | Payer: BC Managed Care – PPO | Attending: Emergency Medicine | Admitting: Emergency Medicine

## 2020-08-22 ENCOUNTER — Emergency Department (HOSPITAL_COMMUNITY): Payer: BC Managed Care – PPO

## 2020-08-22 ENCOUNTER — Encounter (HOSPITAL_COMMUNITY): Payer: Self-pay | Admitting: *Deleted

## 2020-08-22 ENCOUNTER — Other Ambulatory Visit: Payer: Self-pay

## 2020-08-22 DIAGNOSIS — R0789 Other chest pain: Secondary | ICD-10-CM | POA: Insufficient documentation

## 2020-08-22 DIAGNOSIS — R0902 Hypoxemia: Secondary | ICD-10-CM | POA: Diagnosis not present

## 2020-08-22 DIAGNOSIS — R079 Chest pain, unspecified: Secondary | ICD-10-CM | POA: Diagnosis not present

## 2020-08-22 DIAGNOSIS — Z79899 Other long term (current) drug therapy: Secondary | ICD-10-CM | POA: Insufficient documentation

## 2020-08-22 DIAGNOSIS — Z8616 Personal history of COVID-19: Secondary | ICD-10-CM | POA: Insufficient documentation

## 2020-08-22 DIAGNOSIS — I10 Essential (primary) hypertension: Secondary | ICD-10-CM | POA: Diagnosis not present

## 2020-08-22 DIAGNOSIS — R000 Tachycardia, unspecified: Secondary | ICD-10-CM | POA: Diagnosis not present

## 2020-08-22 DIAGNOSIS — R202 Paresthesia of skin: Secondary | ICD-10-CM | POA: Diagnosis not present

## 2020-08-22 LAB — TROPONIN I (HIGH SENSITIVITY)
Troponin I (High Sensitivity): 3 ng/L (ref <18)
Troponin I (High Sensitivity): 3 ng/L (ref <18)

## 2020-08-22 LAB — BASIC METABOLIC PANEL
Anion gap: 13 (ref 5–15)
BUN: 12 mg/dL (ref 6–20)
CO2: 17 mmol/L — ABNORMAL LOW (ref 22–32)
Calcium: 9.7 mg/dL (ref 8.9–10.3)
Chloride: 106 mmol/L (ref 98–111)
Creatinine, Ser: 0.86 mg/dL (ref 0.44–1.00)
GFR calc Af Amer: >60 mL/min (ref >60)
GFR calc non Af Amer: >60 mL/min (ref >60)
Glucose, Bld: 130 mg/dL — ABNORMAL HIGH (ref 70–99)
Potassium: 3.4 mmol/L — ABNORMAL LOW (ref 3.5–5.1)
Sodium: 136 mmol/L (ref 135–145)

## 2020-08-22 LAB — CBC
HCT: 39.8 % (ref 36.0–46.0)
Hemoglobin: 13.5 g/dL (ref 12.0–15.0)
MCH: 29.7 pg (ref 26.0–34.0)
MCHC: 33.9 g/dL (ref 30.0–36.0)
MCV: 87.7 fL (ref 80.0–100.0)
Platelets: 351 10*3/uL (ref 150–400)
RBC: 4.54 MIL/uL (ref 3.87–5.11)
RDW: 12 % (ref 11.5–15.5)
WBC: 9.1 10*3/uL (ref 4.0–10.5)
nRBC: 0 % (ref 0.0–0.2)

## 2020-08-22 LAB — I-STAT BETA HCG BLOOD, ED (MC, WL, AP ONLY): I-stat hCG, quantitative: <5 m[IU]/mL (ref <5)

## 2020-08-22 MED ORDER — HYDROXYZINE HCL 25 MG PO TABS: 50.0000 mg | ORAL_TABLET | Freq: Four times a day (QID) | ORAL | 0 refills | Status: DC | PRN

## 2020-08-22 MED ORDER — HYDROXYZINE HCL 25 MG PO TABS
50.0000 mg | ORAL_TABLET | Freq: Once | ORAL | Status: AC
  Administered 2020-08-22: 50 mg via ORAL
  Filled 2020-08-22: qty 2

## 2020-08-22 MED ORDER — ACETAMINOPHEN 325 MG PO TABS
650.0000 mg | ORAL_TABLET | Freq: Once | ORAL | Status: AC
  Administered 2020-08-22: 650 mg via ORAL
  Filled 2020-08-22: qty 2

## 2020-08-22 NOTE — Discharge Instructions (Addendum)
Your work-up today was all normal.  Your EKG and heart enzymes did not show any signs of a heart attack or heart damage.  Follow-up with your primary care doctor as soon as possible.  Recommend you continue taking naproxen to help with possible inflammation causing your chest wall pain.  You can also take Tylenol for pain.  Do not take any additional Aleve, Motrin, Aleve, ibuprofen as these medications are similar to naproxen.  -Prescription also sent for Vistaril.  This is an as needed medication for anxiety.  You will need to talk to your primary care doctor about anxiety in the future.

## 2020-08-22 NOTE — ED Provider Notes (Signed)
[facility] EMERGENCY DEPARTMENT Provider Note   CSN: [id number] Arrival date & time: 08/22/20  0533     History Chief Complaint  Patient presents with   Chest Pain    Katherine Lindsey is a 34 y.o. female with past medical history significant for Crohn's disease interventions since 2004, gallstones, hidradenitis suppurativa.   HPI Patient presents to emergency department today via EMS with chief complaint of left-sided chest pain.  Patient states the pain started last night when she woke up.  She was in bed and woke up and noticed that there were lights on and had a room so she stood up to turn the lights off.  She felt like her heart was racing and put on her apple watch which showed a heart rate of 170.  At that time she said she had dull aching pain on the left side of her chest that radiated to her left arm and neck.  She rates the pain 7 out of 10 in severity.  The pain is been intermittent.  Pain is not worse with exertion.  She then noticed that she had tingling in bilateral hands.  She does endorse drinking alcohol yesterday, stating it was only a small amount.  She admits to similar pain in the recent past.  She was seen in the ED on 08/11/2020 and had labs collected but left without seeing a provider.  She then was seen at urgent care on 08/16/2020, thought to have chest wall pain and given prescription for naproxen.  She followed up with her PCP on 08/17/2020 who thought her bilateral axilla abscesses might be contributing to the chest wall pain.  She was treated with a course of Bactrim.  She finished the antibiotic yesterday.  She states abscesses have significantly improved and are nontender to the touch.  Denies any fever, chills, syncope, back pain, abdominal pain, nausea, urinary symptoms, diarrhea, lower extremity edema, rash.  Denies any drug use. She denies family history of heart disease. Denies tobacco use.    Past Medical History:  Diagnosis Date    Crohn's disease in remission [facility])    Hasn't had symptoms since 2004   Gallstones    Hammertoe 09/21/2014   History of low transverse cesarean section 11/28/2015   VBAC, delivered 03/19/2016    Patient Active Problem List   Diagnosis Date Noted   Chest wall pain 08/17/2020   Vertigo, benign paroxysmal 07/14/2020   COVID-19 04/15/2020   Nonintractable episodic headache 01/06/2020   Anxiety 07/15/2019   Hidradenitis suppurativa 07/15/2019   High risk HPV infection 02/07/2017    Past Surgical History:  Procedure Laterality Date   CESAREAN SECTION N/A 06/16/2013   Procedure: CESAREAN SECTION;  Surgeon: Reva Bores, MD;  Location: [facility];  Service: Obstetrics;  Laterality: N/A;   CHOLECYSTECTOMY  08/28/2012   Procedure: LAPAROSCOPIC CHOLECYSTECTOMY WITH INTRAOPERATIVE CHOLANGIOGRAM;  Surgeon: [name], DO;  Location: [facility];  Service: General;  Laterality: N/A;   INDUCED ABORTION  02/2012     OB History    Gravida  4   Para  3   Term  3   Preterm      AB  1   Living  3     SAB  0   TAB  1   Ectopic      Multiple  0   Live Births  3        Obstetric Comments  2014 LTCS with extension of hysterotomy inferiorly  Family History  Problem Relation Age of Onset   Other Neg Hx    Diabetes Neg Hx    Heart disease Neg Hx    Hyperlipidemia Neg Hx    Hypertension Neg Hx    Stroke Neg Hx     Social History   Tobacco Use   Smoking status: Never Smoker   Smokeless tobacco: Never Used  Vaping Use   Vaping Use: Never assessed  Substance Use Topics   Alcohol use: Yes    Alcohol/week: 1.0 standard drink    Types: 1 Glasses of wine per week   Drug use: No    Home Medications Prior to Admission medications   Medication Sig Start Date End Date Taking? Authorizing Provider  hydrOXYzine (ATARAX/VISTARIL) 25 MG tablet Take 2 tablets (50 mg total) by mouth every 6 (six) hours as needed. 08/22/20   Alahna Dunne E, PA-C  ibuprofen  (ADVIL,MOTRIN) 200 MG tablet Take 400 mg by mouth every 6 (six) hours as needed for headache or moderate pain.    [provider]  medroxyPROGESTERone (DEPO-PROVERA) 150 MG/ML injection Inject 150 mg into the muscle every 3 (three) months.    [provider]  naproxen (NAPROSYN) 500 MG tablet Take 1 tablet (500 mg total) by mouth 2 (two) times daily. 08/16/20   Eustace Moore, MD  sulfamethoxazole-trimethoprim (BACTRIM DS) 800-160 MG tablet Take 1 tablet by mouth 2 (two) times daily for 7 days. 08/16/20 08/23/20  Eustace Moore, MD  promethazine (PHENERGAN) 25 MG tablet Take 1 tablet (25 mg total) by mouth every 8 (eight) hours as needed (headache). Patient not taking: Reported on 07/14/2020 03/28/20 08/16/20  Gilda Crease, MD  SUMAtriptan (IMITREX) 50 MG tablet Take 1 tablet (50 mg total) by mouth every 2 (two) hours as needed for migraine. May repeat in 2 hours if headache persists or recurs. Patient not taking: Reported on 07/14/2020 01/27/20 08/16/20  Gailen Shelter, PA    Allergies    Patient has no known allergies.  Review of Systems   Review of Systems All other systems are reviewed and are negative for acute change except as noted in the HPI.  Physical Exam Updated Vital Signs BP 129/68 (BP Location: Right Arm)    Pulse 70    Temp 98.5 F (36.9 C) (Oral)    Resp 18    Ht 5\' 6"  (1.676 m)    Wt 76.7 kg    SpO2 100%    BMI 27.28 kg/m   Physical Exam Vitals and nursing note reviewed.  Constitutional:      General: She is not in acute distress.    Appearance: She is not ill-appearing.  HENT:     Head: Normocephalic and atraumatic.     Right Ear: Tympanic membrane and external ear normal.     Left Ear: Tympanic membrane and external ear normal.     Nose: Nose normal.     Mouth/Throat:     Mouth: Mucous membranes are moist.     Pharynx: Oropharynx is clear.  Eyes:     General: No scleral icterus.       Right eye: No discharge.        Left eye: No  discharge.     Extraocular Movements: Extraocular movements intact.     Conjunctiva/sclera: Conjunctivae normal.     Pupils: Pupils are equal, round, and reactive to light.  Neck:     Vascular: No JVD.  Cardiovascular:     Rate and  Rhythm: Normal rate and regular rhythm.     Pulses: Normal pulses.          Radial pulses are 2+ on the right side and 2+ on the left side.     Heart sounds: Normal heart sounds.  Pulmonary:     Comments: Lungs clear to auscultation in all fields. Symmetric chest rise. No wheezing, rales, or rhonchi. Chest:     Chest wall: Tenderness present.       Comments: Tenderness to palpation as depicted in image above.  No overlying skin changes.  No palpable fluctuance, no gross abscess Abdominal:     Comments: Abdomen is soft, non-distended, and non-tender in all quadrants. No rigidity, no guarding. No peritoneal signs.  Musculoskeletal:        General: Normal range of motion.     Cervical back: Normal range of motion.     Right lower leg: No edema.     Left lower leg: No edema.  Skin:    General: Skin is warm and dry.     Capillary Refill: Capillary refill takes less than 2 seconds.  Neurological:     Mental Status: She is oriented to person, place, and time.     GCS: GCS eye subscore is 4. GCS verbal subscore is 5. GCS motor subscore is 6.     Comments: Fluent speech, no facial droop. CN 2-12 grossly intact. Ambulatory with normal gait  Psychiatric:        Mood and Affect: Mood is anxious.        Behavior: Behavior normal.     ED Results / Procedures / Treatments   Labs (all labs ordered are listed, but only abnormal results are displayed) Labs Reviewed  BASIC METABOLIC PANEL - Abnormal; Notable for the following components:      Result Value   Potassium 3.4 (*)    CO2 17 (*)    Glucose, Bld 130 (*)    All other components within normal limits  CBC  I-STAT BETA HCG BLOOD, ED ([facility], [facility], [facility] ONLY)  TROPONIN I (HIGH SENSITIVITY)  TROPONIN I (HIGH  SENSITIVITY)    EKG EKG Interpretation  Date/Time:  Monday August 22 2020 05:48:00 EDT Ventricular Rate:  87 PR Interval:  142 QRS Duration: 72 QT Interval:  346 QTC Calculation: 416 R Axis:   75 Text Interpretation: Normal sinus rhythm with sinus arrhythmia Nonspecific T wave abnormality Abnormal ECG since last tracing no significant change Confirmed by Rolan Bucco [phone]) on 08/22/2020 10:28:39 AM   Radiology DG Chest 2 View  Result Date: 08/22/2020 CLINICAL DATA:  Chest pain EXAM: CHEST - 2 VIEW COMPARISON:  08/11/2020 FINDINGS: Lungs are clear.  No pleural effusion or pneumothorax. The heart is normal in size. Visualized osseous structures are within normal limits. IMPRESSION: Normal chest radiographs. Electronically Signed   By: Charline Bills M.D.   On: 08/22/2020 06:30    Procedures Procedures (including critical care time)  Medications Ordered in ED Medications  acetaminophen (TYLENOL) tablet 650 mg (has no administration in time range)  hydrOXYzine (ATARAX/VISTARIL) tablet 50 mg (has no administration in time range)    ED Course  I have reviewed the triage vital signs and the nursing notes.  Pertinent labs & imaging results that were available during my care of the patient were reviewed by me and considered in my medical decision making (see chart for details).    MDM Rules/Calculators/A&P  History provided by patient with additional history obtained from chart review.    Patient presents to the emergency department with chest pain. Patient nontoxic appearing, in no apparent distress, vitals without significant abnormality. Fairly benign physical exam, chest pain is reproducible.  DDX: ACS, pulmonary embolism, dissection, pneumothorax, effusion, infiltrate, arrhythmia, anemia, electrolyte derangement, MSK. Evaluation initiated with labs, EKG, and CXR. Patient on cardiac monitor.   Work-up in the ER unremarkable. Labs reviewed, no  leukocytosis, anemia, or significant electrolyte abnormality. CXR without infiltrate, effusion, pneumothorax, or fracture/dislocation.   Low risk heart score of 1, EKG without obvious ischemia, delta troponin negative, doubt ACS. Patient is low risk wells, PERC negative, doubt pulmonary embolism. Pain is not a tearing sensation, symmetric pulses, no widening of mediastinum on CXR, doubt dissection. Cardiac monitor reviewed, no notable arrhythmias or tachycardia.  Patient reported tachycardia at home but the reading from her apple watch.  No tachycardia noted here.  Patient is anxious appearing.  Patient has appeared hemodynamically stable throughout ER visit and appears safe for discharge with close PCP follow up.  She has a prescription for naproxen which I encouraged her to continue taking for her chest wall pain.  Also given dose of Vistaril.  I discussed results, treatment plan, need for PCP follow-up, and return precautions with the patient. Provided opportunity for questions, patient confirmed understanding and is in agreement with plan.    Portions of this note were generated with Scientist, clinical (histocompatibility and immunogenetics). Dictation errors may occur despite best attempts at proofreading.    Final Clinical Impression(s) / ED Diagnoses Final diagnoses:  Chest wall pain    Rx / DC Orders ED Discharge Orders         Ordered    hydrOXYzine (ATARAX/VISTARIL) 25 MG tablet  Every 6 hours PRN        08/22/20 1102           Keala Drum, Turrell E, PA-C 08/22/20 1104    Rolan Bucco, MD 08/22/20 [phone]

## 2020-08-22 NOTE — ED Triage Notes (Signed)
Presents to ed via [facility] states she woke up and was walking around looked at her watch and saw that her heart rate was 170 cp left sided chest pain with radiation to her left arm and neck. Breathing fast upon arrival , states pain is a little better now. Appears anxious

## 2020-08-22 NOTE — ED Notes (Signed)
EDP  At bedside to review results of tests done today and Sx's

## 2020-08-22 NOTE — ED Notes (Signed)
Pt reports she does not feel safe to go home.

## 2020-08-24 ENCOUNTER — Ambulatory Visit: Payer: BC Managed Care – PPO

## 2020-08-26 ENCOUNTER — Ambulatory Visit (INDEPENDENT_AMBULATORY_CARE_PROVIDER_SITE_OTHER): Payer: BC Managed Care – PPO | Admitting: Family Medicine

## 2020-08-26 ENCOUNTER — Other Ambulatory Visit: Payer: Self-pay

## 2020-08-26 ENCOUNTER — Ambulatory Visit: Payer: BC Managed Care – PPO

## 2020-08-26 ENCOUNTER — Encounter: Payer: Self-pay | Admitting: Family Medicine

## 2020-08-26 VITALS — BP 118/62 | HR 87 | Ht 66.0 in | Wt 168.4 lb

## 2020-08-26 DIAGNOSIS — K219 Gastro-esophageal reflux disease without esophagitis: Secondary | ICD-10-CM

## 2020-08-26 DIAGNOSIS — F419 Anxiety disorder, unspecified: Secondary | ICD-10-CM | POA: Diagnosis not present

## 2020-08-26 MED ORDER — OMEPRAZOLE 20 MG PO CPDR: 20.0000 mg | DELAYED_RELEASE_CAPSULE | Freq: Every day | ORAL | 0 refills | Status: DC

## 2020-08-26 MED ORDER — ESCITALOPRAM OXALATE 10 MG PO TABS: 10.0000 mg | ORAL_TABLET | Freq: Every day | ORAL | 0 refills | Status: DC

## 2020-08-26 NOTE — Progress Notes (Signed)
° ° °  SUBJECTIVE:   CHIEF COMPLAINT / HPI:   Anxiety Recently seen in ED for chest pain due to home HR 170 on apple watch. Patient endorsed that number made her panic even more. Was able to be calmed down by brother who came over a few days prior to this incident for the same issue. Was said it was anxiety in the ED. Given hydroxyzine and has been helping with anxiety attacks but makes her sleepy. Endorses history of anxiety during college and was in therapy but never medicated. Has 3 children and is a single parent which gives her the most anxiety trying to provide alone. Also endorses burning in her chest that has worsened with her anxiety. Would like to try a long term medication and return to therapy.   PERTINENT  PMH / PSH: As above.   OBJECTIVE:   BP 118/62    Pulse 87    Ht 5\' 6"  (1.676 m)    Wt 168 lb 6.4 oz (76.4 kg)    SpO2 99%    BMI 27.18 kg/m   General: Appears well, no acute distress. Age appropriate. Cardiac: RRR, normal heart sounds, no murmurs Respiratory: CTAB, normal effort Psych: normal affect  GAD 7 : Generalized Anxiety Score 08/31/2020 06/08/2020 07/15/2019 07/15/2019  Nervous, Anxious, on Edge 1 0 1 1  Control/stop worrying 2 0 1 1  Worry too much - different things 2 0 1 1  Trouble relaxing 1 0 1 1  Restless 0 0 0 0  Easily annoyed or irritable 0 0 0 0  Afraid - awful might happen 1 0 1 1  Total GAD 7 Score 7 0 5 5  Anxiety Difficulty Not difficult at all - Somewhat difficult -    ASSESSMENT/PLAN:   Anxiety Review recent ED visit. Unremarkable work up. GAD7 score 7 mild anxiety. -Start lexapro 10 mg daily -follow up in 2 weeks for a mood check -visit [email or web address] to find a therapist.   Gastroesophageal reflux disease -Start omeprazole 20 mg  -Follow up if no improvement -Assess for discontinuation at follow up if resolves with treatment of anxiety   07/17/2019, DO [facility]

## 2020-08-26 NOTE — Patient Instructions (Addendum)
Today you were seen for a follow up.  I am glad you arms feel better.   Your occasional chest pain and increase heart rate is highly suggestive of untreated anxiety.   Today we will start lexapro 10 mg daily and follow up in 2 weeks for a mood check.  Please visit [email or web address] to find a therapist.   Please call the clinic at [phone] if your symptoms worsen or you have any concerns. It was our pleasure to serve you.  Dr. Salvadore Dom

## 2020-08-29 ENCOUNTER — Other Ambulatory Visit: Payer: Self-pay

## 2020-08-29 ENCOUNTER — Encounter: Payer: Self-pay | Admitting: Family Medicine

## 2020-08-29 ENCOUNTER — Ambulatory Visit (INDEPENDENT_AMBULATORY_CARE_PROVIDER_SITE_OTHER): Payer: BC Managed Care – PPO | Admitting: Family Medicine

## 2020-08-29 ENCOUNTER — Other Ambulatory Visit (HOSPITAL_COMMUNITY)
Admission: RE | Admit: 2020-08-29 | Discharge: 2020-08-29 | Disposition: A | Discharge status: Home / Self Care | Payer: BC Managed Care – PPO | Source: Ambulatory Visit | Attending: Family Medicine | Admitting: Family Medicine

## 2020-08-29 VITALS — BP 148/83 | HR 71 | Wt 170.0 lb

## 2020-08-29 DIAGNOSIS — Z1151 Encounter for screening for human papillomavirus (HPV): Secondary | ICD-10-CM | POA: Diagnosis not present

## 2020-08-29 DIAGNOSIS — Z01419 Encounter for gynecological examination (general) (routine) without abnormal findings: Secondary | ICD-10-CM

## 2020-08-29 DIAGNOSIS — Z3042 Encounter for surveillance of injectable contraceptive: Secondary | ICD-10-CM

## 2020-08-29 DIAGNOSIS — K219 Gastro-esophageal reflux disease without esophagitis: Secondary | ICD-10-CM

## 2020-08-29 DIAGNOSIS — B977 Papillomavirus as the cause of diseases classified elsewhere: Secondary | ICD-10-CM

## 2020-08-29 DIAGNOSIS — Z124 Encounter for screening for malignant neoplasm of cervix: Secondary | ICD-10-CM | POA: Diagnosis not present

## 2020-08-29 DIAGNOSIS — F419 Anxiety disorder, unspecified: Secondary | ICD-10-CM

## 2020-08-29 DIAGNOSIS — Z113 Encounter for screening for infections with a predominantly sexual mode of transmission: Secondary | ICD-10-CM

## 2020-08-29 DIAGNOSIS — R87810 Cervical high risk human papillomavirus (HPV) DNA test positive: Secondary | ICD-10-CM | POA: Diagnosis not present

## 2020-08-29 MED ORDER — MEDROXYPROGESTERONE ACETATE 150 MG/ML IM SUSP
150.0000 mg | Freq: Once | INTRAMUSCULAR | Status: AC
  Administered 2020-08-29: 150 mg via INTRAMUSCULAR

## 2020-08-29 NOTE — Progress Notes (Signed)
  Subjective:     Katherine Lindsey is a 34 y.o. female and is here for a comprehensive physical exam. The patient reports problems - anxiety.     The following portions of the patient's history were reviewed and updated as appropriate: allergies, current medications, past family history, past medical history, past social history, past surgical history and problem list.  Review of Systems Pertinent items noted in HPI and remainder of comprehensive ROS otherwise negative.   Objective:    BP (!) 148/83   Pulse 71   Wt 170 lb (77.1 kg)   BMI 27.44 kg/m  General appearance: alert, cooperative and appears stated age Head: Normocephalic, without obvious abnormality, atraumatic Neck: no adenopathy, supple, symmetrical, trachea midline and thyroid not enlarged, symmetric, no tenderness/mass/nodules Lungs: clear to auscultation bilaterally Heart: regular rate and rhythm, S1, S2 normal, no murmur, click, rub or gallop Abdomen: soft, non-tender; bowel sounds normal; no masses,  no organomegaly Pelvic: cervix normal in appearance, external genitalia normal, no adnexal masses or tenderness, no cervical motion tenderness, uterus normal size, shape, and consistency and vagina normal without discharge Extremities: extremities normal, atraumatic, no cyanosis or edema Pulses: 2+ and symmetric Skin: Skin color, texture, turgor normal. No rashes or lesions Neurologic: Grossly normal    Assessment:    Healthy female exam.      Plan:   Problem List Items Addressed This Visit      Unprioritized   High risk HPV infection - Primary   Relevant Orders   Cytology - PAP( [place])   Anxiety    Mindfulness training, seek counseling. Vistaril is making her too sleepy.      GERD (gastroesophageal reflux disease)    PPI prn--may use OTC Prilosec, do not take daily, may use as infrequently as q 2 wks and be effective.       Other Visit Diagnoses    Screening for malignant neoplasm of cervix        Relevant Orders   Cytology - PAP( [place])   Encounter for gynecological examination without abnormal finding       Screen for STD (sexually transmitted disease)       Relevant Orders   Hepatitis B surface antigen   Hepatitis C antibody   HIV Antibody (routine testing w rflx)   RPR     Return in 3 months (on 11/28/2020) for Depo Provera injection.    See After Visit Summary for Counseling Recommendations

## 2020-08-29 NOTE — Assessment & Plan Note (Signed)
PPI prn--may use OTC Prilosec, do not take daily, may use as infrequently as q 2 wks and be effective.

## 2020-08-29 NOTE — Patient Instructions (Addendum)
Prilosec for heartburn Mindfulness training  Preventive Care 33-33 Years Old, Female Preventive care refers to visits with your health care provider and lifestyle choices that can promote health and wellness. This includes:  A yearly physical exam. This may also be called an annual well check.  Regular dental visits and eye exams.  Immunizations.  Screening for certain conditions.  Healthy lifestyle choices, such as eating a healthy diet, getting regular exercise, not using drugs or products that contain nicotine and tobacco, and limiting alcohol use. What can I expect for my preventive care visit? Physical exam Your health care provider will check your:  Height and weight. This may be used to calculate body mass index (BMI), which tells if you are at a healthy weight.  Heart rate and blood pressure.  Skin for abnormal spots. Counseling Your health care provider may ask you questions about your:  Alcohol, tobacco, and drug use.  Emotional well-being.  Home and relationship well-being.  Sexual activity.  Eating habits.  Work and work Statistician.  Method of birth control.  Menstrual cycle.  Pregnancy history. What immunizations do I need?  Influenza (flu) vaccine  This is recommended every year. Tetanus, diphtheria, and pertussis (Tdap) vaccine  You may need a Td booster every 10 years. Varicella (chickenpox) vaccine  You may need this if you have not been vaccinated. Human papillomavirus (HPV) vaccine  If recommended by your health care provider, you may need three doses over 6 months. Measles, mumps, and rubella (MMR) vaccine  You may need at least one dose of MMR. You may also need a second dose. Meningococcal conjugate (MenACWY) vaccine  One dose is recommended if you are age 1-21 years and a first-year college student living in a residence hall, or if you have one of several medical conditions. You may also need additional booster  doses. Pneumococcal conjugate (PCV13) vaccine  You may need this if you have certain conditions and were not previously vaccinated. Pneumococcal polysaccharide (PPSV23) vaccine  You may need one or two doses if you smoke cigarettes or if you have certain conditions. Hepatitis A vaccine  You may need this if you have certain conditions or if you travel or work in places where you may be exposed to hepatitis A. Hepatitis B vaccine  You may need this if you have certain conditions or if you travel or work in places where you may be exposed to hepatitis B. Haemophilus influenzae type b (Hib) vaccine  You may need this if you have certain conditions. You may receive vaccines as individual doses or as more than one vaccine together in one shot (combination vaccines). Talk with your health care provider about the risks and benefits of combination vaccines. What tests do I need?  Blood tests  Lipid and cholesterol levels. These may be checked every 5 years starting at age 33.  Hepatitis C test.  Hepatitis B test. Screening  Diabetes screening. This is done by checking your blood sugar (glucose) after you have not eaten for a while (fasting).  Sexually transmitted disease (STD) testing.  BRCA-related cancer screening. This may be done if you have a family history of breast, ovarian, tubal, or peritoneal cancers.  Pelvic exam and Pap test. This may be done every 3 years starting at age 40. Starting at age 20, this may be done every 5 years if you have a Pap test in combination with an HPV test. Talk with your health care provider about your test results, treatment options, and if  necessary, the need for more tests. Follow these instructions at home: Eating and drinking   Eat a diet that includes fresh fruits and vegetables, whole grains, lean protein, and low-fat dairy.  Take vitamin and mineral supplements as recommended by your health care provider.  Do not drink alcohol  if: ? Your health care provider tells you not to drink. ? You are pregnant, may be pregnant, or are planning to become pregnant.  If you drink alcohol: ? Limit how much you have to 0-1 drink a day. ? Be aware of how much alcohol is in your drink. In the [place] one drink equals one 12 oz bottle of beer (355 mL), one 5 oz glass of wine (148 mL), or one 1 oz glass of hard liquor (44 mL). Lifestyle  Take daily care of your teeth and gums.  Stay active. Exercise for at least 30 minutes on 5 or more days each week.  Do not use any products that contain nicotine or tobacco, such as cigarettes, e-cigarettes, and chewing tobacco. If you need help quitting, ask your health care provider.  If you are sexually active, practice safe sex. Use a condom or other form of birth control (contraception) in order to prevent pregnancy and STIs (sexually transmitted infections). If you plan to become pregnant, see your health care provider for a preconception visit. What's next?  Visit your health care provider once a year for a well check visit.  Ask your health care provider how often you should have your eyes and teeth checked.  Stay up to date on all vaccines. This information is not intended to replace advice given to you by your health care provider. Make sure you discuss any questions you have with your health care provider. Document Revised: 08/14/2018 Document Reviewed: 08/14/2018 Elsevier Patient Education  2020 [employer].  Contraception Choices Contraception, also called birth control, means things to use or ways to try not to get pregnant. Hormonal birth control This kind of birth control uses hormones. Here are some types of hormonal birth control:  A tube that is put under skin of the arm (implant). The tube can stay in for as long as 3 years.  Shots to get every 3 months (injections).  Pills to take every day (birth control pills).  A patch to change 1 time each week for 3 weeks  (birth control patch). After that, the patch is taken off for 1 week.  A ring to put in the vagina. The ring is left in for 3 weeks. Then it is taken out of the vagina for 1 week. Then a new ring is put in.  Pills to take after unprotected sex (emergency birth control pills). Barrier birth control Here are some types of barrier birth control:  A thin covering that is put on the penis before sex (female condom). The covering is thrown away after sex.  A soft, loose covering that is put in the vagina before sex (female condom). The covering is thrown away after sex.  A rubber bowl that sits over the cervix (diaphragm). The bowl must be made for you. The bowl is put into the vagina before sex. The bowl is left in for 6-8 hours after sex. It is taken out within 24 hours.  A small, soft cup that fits over the cervix (cervical cap). The cup must be made for you. The cup can be left in for 6-8 hours after sex. It is taken out within 48 hours.  A sponge  that is put into the vagina before sex. It must be left in for at least 6 hours after sex. It must be taken out within 30 hours. Then it is thrown away.  A chemical that kills or stops sperm from getting into the uterus (spermicide). It may be a pill, cream, jelly, or foam to put in the vagina. The chemical should be used at least 10-15 minutes before sex. IUD (intrauterine) birth control An IUD is a small, T-shaped piece of plastic. It is put inside the uterus. There are two kinds:  Hormone IUD. This kind can stay in for 3-5 years.  Copper IUD. This kind can stay in for 10 years. Permanent birth control Here are some types of permanent birth control:  Surgery to block the fallopian tubes.  Having an insert put into each fallopian tube.  Surgery to tie off the tubes that carry sperm (vasectomy). Natural planning birth control Here are some types of natural planning birth control:  Not having sex on the days the woman could get  pregnant.  Using a calendar: ? To keep track of the length of each period. ? To find out what days pregnancy can happen. ? To plan to not have sex on days when pregnancy can happen.  Watching for symptoms of ovulation and not having sex during ovulation. One way the woman can check for ovulation is to check her temperature.  Waiting to have sex until after ovulation. Summary  Contraception, also called birth control, means things to use or ways to try not to get pregnant.  Hormonal methods of birth control include implants, injections, pills, patches, vaginal rings, and emergency birth control pills.  Barrier methods of birth control can include female condoms, female condoms, diaphragms, cervical caps, sponges, and spermicides.  There are two types of IUD (intrauterine device) birth control. An IUD can be put in a woman's uterus to prevent pregnancy for 3-5 years.  Permanent sterilization can be done through a procedure for males, females, or both.  Natural planning methods involve not having sex on the days when the woman could get pregnant. This information is not intended to replace advice given to you by your health care provider. Make sure you discuss any questions you have with your health care provider. Document Revised: 03/25/2019 Document Reviewed: 12/13/2016 Elsevier Patient Education  [place].

## 2020-08-29 NOTE — Assessment & Plan Note (Signed)
Mindfulness training, seek counseling. Vistaril is making her too sleepy.

## 2020-08-29 NOTE — Progress Notes (Signed)
Debbe Bales here for Depo-Provera  Injection.  Injection administered without complication. Patient will return in 3 months for next injection.  Janene Madeira Tareek Sabo, CMA 08/29/2020  5:07 PM

## 2020-08-30 LAB — HEPATITIS C ANTIBODY: Hep C Virus Ab: <0.1 s/co ratio (ref 0.0–0.9)

## 2020-08-30 LAB — RPR: RPR Ser Ql: NONREACTIVE

## 2020-08-30 LAB — HIV ANTIBODY (ROUTINE TESTING W REFLEX): HIV Screen 4th Generation wRfx: NONREACTIVE

## 2020-08-30 LAB — HEPATITIS B SURFACE ANTIGEN: Hepatitis B Surface Ag: NEGATIVE

## 2020-08-31 NOTE — Assessment & Plan Note (Signed)
-  Start omeprazole 20 mg  -Follow up if no improvement -Assess for discontinuation at follow up if resolves with treatment of anxiety

## 2020-08-31 NOTE — Assessment & Plan Note (Addendum)
Review recent ED visit. Unremarkable work up. GAD7 score 7 mild anxiety. -Start lexapro 10 mg daily -follow up in 2 weeks for a mood check -visit [email or web address] to find a therapist.

## 2020-09-05 LAB — CYTOLOGY - PAP
Chlamydia: NEGATIVE
Comment: NEGATIVE
Comment: NEGATIVE
Comment: NEGATIVE
Comment: NEGATIVE
Comment: NORMAL
Diagnosis: NEGATIVE
Diagnosis: REACTIVE
HPV 16: NEGATIVE
HPV 18 / 45: POSITIVE — AB
High risk HPV: POSITIVE — AB
Neisseria Gonorrhea: NEGATIVE
Trichomonas: NEGATIVE

## 2020-09-06 ENCOUNTER — Ambulatory Visit: Payer: BC Managed Care – PPO | Admitting: Women's Health

## 2020-09-30 ENCOUNTER — Ambulatory Visit: Payer: BC Managed Care – PPO | Admitting: Student in an Organized Health Care Education/Training Program

## 2020-09-30 ENCOUNTER — Other Ambulatory Visit: Payer: Self-pay

## 2020-09-30 ENCOUNTER — Ambulatory Visit (INDEPENDENT_AMBULATORY_CARE_PROVIDER_SITE_OTHER): Payer: BC Managed Care – PPO | Admitting: Student in an Organized Health Care Education/Training Program

## 2020-09-30 DIAGNOSIS — F419 Anxiety disorder, unspecified: Secondary | ICD-10-CM

## 2020-09-30 NOTE — Progress Notes (Signed)
    SUBJECTIVE:   CHIEF COMPLAINT / HPI: anxiety f/u  Anxiety-  Patient has talked to 2 counselors since last visit and had one good experience that she would like to follow up on. lexapro- did not take it at all. Due to other people saying it had bad results for them and that worried her.  Taking hydroxyzine as needed and only took once since last visit. She thinks it helps. Continues to have abnormal Chest wall sensation same as occurred prior to last evaluation here and at ED- bilateral T2 anterior chest pain a couple times a day. Travels up to neck and gives a numbness headache pain. Lasts up to 30 minutes. Occurs at rest or movement. Not effected by arm movement or breathing. Reproducible by palpation. No SOB, nausea, diaphoresis. When it occurs it makes her anxious. Going on about a month or so. no visual changes. Has numbness in random places in her body like arm or leg.  OBJECTIVE:   BP 115/75   Pulse 71   Ht 5\' 8"  (1.727 m)   Wt 162 lb 6.4 oz (73.7 kg)   SpO2 100%   BMI 24.69 kg/m   General: NAD, pleasant, able to participate in exam Cardiac: RRR, normal heart sounds, no murmurs. 2+ radial and PT pulses bilaterally. Tenderness to palpation of chest bilaterally inferior to clavicles.  Respiratory: CTAB, normal effort, No wheezes, rales or rhonchi Extremities: no edema. WWP. Skin: warm and dry, no rashes noted Neuro: alert and oriented, no focal deficits Psych: Normal affect and mood  ASSESSMENT/PLAN:   Anxiety Long discussion on indications, SE profile of SSRIs. Patient requested clonazepam.  Shared decision making on wether to prescribe different medication or try the already prescribed medication. - patient elects to take the prescribed lexapro - recommended she use the atarax when having the strange chest sensation to see if it helps. Provided reassurance that she had a normal exam today and was cleared of emergent causes in the ED.  - continue with counseling - f/u with  me in 4 weeks     , DO [facility]

## 2020-09-30 NOTE — Patient Instructions (Addendum)
It was a pleasure to see you today!  To summarize our discussion for this visit:  Please continue with counseling as this can be really beneficial  For your medication, take the hydroxyzine as needed and start the lexapro daily. We will meet again in about 4 weeks to check in on how you're doing.   Some additional health maintenance measures we should update are: Health Maintenance Due  Topic Date Due  . COVID-19 Vaccine (1) Never done  .   Call the clinic at [phone] if your symptoms worsen or you have any concerns.   Thank you for allowing me to take part in your care,  Dr. Jamelle Rushing   Escitalopram oral solution What is this medicine? ESCITALOPRAM (es sye TAL oh pram) is used to treat depression and certain types of anxiety. This medicine may be used for other purposes; ask your health care provider or pharmacist if you have questions. COMMON BRAND NAME(S): Lexapro What should I tell my health care provider before I take this medicine? They need to know if you have any of these conditions:  bipolar disorder or a family history of bipolar disorder  diabetes  glaucoma  heart disease  kidney or liver disease  receiving electroconvulsive therapy  seizures (convulsions)  suicidal thoughts, plans, or attempt by you or a family member  an unusual or allergic reaction to escitalopram, citalopram, other medicines, foods, dyes, or preservatives  pregnant or trying to become pregnant  breast-feeding How should I use this medicine? Take this medicine by mouth. Follow the directions on the prescription label. Use a specially marked spoon or container to measure your medicine. Ask your pharmacist if you do not have one. Household spoons are not accurate. This medicine can be taken with or without food. Take your medicine at regular intervals. Do not take it more often than directed. Do not stop taking this medicine suddenly except upon the advice of your doctor.  Stopping this medicine too quickly may cause serious side effects or your condition may worsen. A special MedGuide will be given to you by the pharmacist with each prescription and refill. Be sure to read this information carefully each time. Talk to your pediatrician regarding the use of this medicine in children. Special care may be needed. Overdosage: If you think you have taken too much of this medicine contact a poison control center or emergency room at once. NOTE: This medicine is only for you. Do not share this medicine with others. What if I miss a dose? If you miss a dose, take it as soon as you can. If it is almost time for your next dose, take only that dose. Do not take double or extra doses. What may interact with this medicine? Do not take this medicine with any of the following medications:  certain medicines for fungal infections like fluconazole, itraconazole, ketoconazole, posaconazole, voriconazole  cisapride  citalopram  dronedarone  linezolid  MAOIs like Carbex, Eldepryl, Marplan, Nardil, and Parnate  methylene blue (injected into a vein)  pimozide  thioridazine This medicine may also interact with the following medications:  alcohol  amphetamines  aspirin and aspirin-like medicines  carbamazepine  certain medicines for depression, anxiety, or psychotic disturbances  certain medicines for migraine headache like almotriptan, eletriptan, frovatriptan, naratriptan, rizatriptan, sumatriptan, zolmitriptan  certain medicines for sleep  certain medicines that treat or prevent blood clots like warfarin, enoxaparin, and dalteparin  cimetidine  diuretics  dofetilide  fentanyl  furazolidone  isoniazid  lithium  metoprolol  NSAIDs, medicines for pain and inflammation, like ibuprofen or naproxen  other medicines that prolong the QT interval (cause an abnormal heart rhythm)  procarbazine  rasagiline  supplements like St. John's wort, kava  kava, valerian  tramadol  tryptophan  ziprasidone This list may not describe all possible interactions. Give your health care provider a list of all the medicines, herbs, non-prescription drugs, or dietary supplements you use. Also tell them if you smoke, drink alcohol, or use illegal drugs. Some items may interact with your medicine. What should I watch for while using this medicine? Tell your doctor if your symptoms do not get better or if they get worse. Visit your doctor or health care professional for regular checks on your progress. Because it may take several weeks to see the full effects of this medicine, it is important to continue your treatment as prescribed by your doctor. Patients and their families should watch out for new or worsening thoughts of suicide or depression. Also watch out for sudden changes in feelings such as feeling anxious, agitated, panicky, irritable, hostile, aggressive, impulsive, severely restless, overly excited and hyperactive, or not being able to sleep. If this happens, especially at the beginning of treatment or after a change in dose, call your health care professional. Bonita Quin may get drowsy or dizzy. Do not drive, use machinery, or do anything that needs mental alertness until you know how this medicine affects you. Do not stand or sit up quickly, especially if you are an older patient. This reduces the risk of dizzy or fainting spells. Alcohol may interfere with the effect of this medicine. Avoid alcoholic drinks. Your mouth may get dry. Chewing sugarless gum or sucking hard candy, and drinking plenty of water may help. Contact your doctor if the problem does not go away or is severe. What side effects may I notice from receiving this medicine? Side effects that you should report to your doctor or health care professional as soon as possible:  allergic reactions like skin rash, itching or hives, swelling of the face, lips, or tongue  anxious  black, tarry  stools  changes in vision  confusion  elevated mood, decreased need for sleep, racing thoughts, impulsive behavior  eye pain  fast, irregular heartbeat  feeling faint or lightheaded, falls  feeling agitated, angry, or irritable  hallucination, loss of contact with reality  loss of balance or coordination  loss of memory  restlessness, pacing, inability to keep still  seizures  stiff muscles  suicidal thoughts or other mood changes  trouble sleeping  unusual bleeding or bruising  unusually weak or tired  vomiting Side effects that usually do not require medical attention (report to your doctor or health care professional if they continue or are bothersome):  changes in appetite  change in sex drive or performance  headache  increased sweating  indigestion, nausea  tremors Ths list may not describe all possible side effects. Call your doctor for medical advice about side effects. You may report side effects to FDA at 1-800-FDA-1088. This list may not describe all possible side effects. Call your doctor for medical advice about side effects. You may report side effects to FDA at 1-800-FDA-1088. Where should I keep my medicine? Keep out of reach of children. Store at room temperature between 15 and 30 degrees C (59 and 86 degrees F). Throw away any unused medicine after the expiration date. NOTE: This sheet is a summary. It may not cover all possible information. If you have questions about  this medicine, talk to your doctor, pharmacist, or health care provider.  2020 Elsevier/Gold Standard (2018-11-24 11:19:07)

## 2020-10-01 NOTE — Assessment & Plan Note (Addendum)
Long discussion on indications, SE profile of SSRIs. Patient requested clonazepam.  Shared decision making on wether to prescribe different medication or try the already prescribed medication. - patient elects to take the prescribed lexapro - recommended she use the atarax when having the strange chest sensation to see if it helps. Provided reassurance that she had a normal exam today and was cleared of emergent causes in the ED.  - continue with counseling - f/u with me in 4 weeks

## 2020-10-17 ENCOUNTER — Ambulatory Visit: Payer: BC Managed Care – PPO | Admitting: Family Medicine

## 2020-10-17 ENCOUNTER — Encounter: Payer: Self-pay | Admitting: Family Medicine

## 2020-10-17 NOTE — Progress Notes (Signed)
Patient did not keep appointment today. She will be called to reschedule.  

## 2020-10-18 ENCOUNTER — Encounter (HOSPITAL_COMMUNITY): Payer: Self-pay

## 2020-10-18 ENCOUNTER — Ambulatory Visit (HOSPITAL_COMMUNITY)
Admission: EM | Admit: 2020-10-18 | Discharge: 2020-10-18 | Disposition: A | Discharge status: Home / Self Care | Payer: BC Managed Care – PPO | Attending: Family Medicine | Admitting: Family Medicine

## 2020-10-18 ENCOUNTER — Other Ambulatory Visit: Payer: Self-pay

## 2020-10-18 DIAGNOSIS — M542 Cervicalgia: Secondary | ICD-10-CM

## 2020-10-18 DIAGNOSIS — F419 Anxiety disorder, unspecified: Secondary | ICD-10-CM

## 2020-10-18 DIAGNOSIS — R0789 Other chest pain: Secondary | ICD-10-CM

## 2020-10-18 HISTORY — DX: Anxiety disorder, unspecified: F41.9

## 2020-10-18 MED ORDER — TIZANIDINE HCL 4 MG PO TABS: 4.0000 mg | ORAL_TABLET | Freq: Three times a day (TID) | ORAL | 0 refills | Status: DC | PRN

## 2020-10-18 NOTE — ED Triage Notes (Signed)
Pt c/o central CP radiating to bilateral neck areas and jaw lines, onset yesterday. Characterizes as "dull" pain. Also reports pain travels to head and posterior neck and that neck has felt "stiff". Current CP 5/10 scale. Pain is not reproducible on palpation or movement, inspiration. Also reports feeling dizzy yesterday.  States h/o anxiety but reports "this feels different than when I have anxiety."  Denies n/v, diaphoresis, SOB, edema, cough, change in vision, mentation, or numbness to extremities. EKG performed and given to Dr. Tracie Harrier who advised pt can be further evaluated here according to time of arrival.

## 2020-10-18 NOTE — Discharge Instructions (Addendum)
Check back with your regular doctor to see if they could do labs for your thyroid or your adrenal glands to rule this out as a source of your anxiety. You can also try to establish care with a new provider on the list.

## 2020-10-18 NOTE — ED Provider Notes (Signed)
[facility]   MRN: [id number] DOB: 08/12/1986  Subjective:   Katherine Lindsey is a 34 y.o. female presenting for recurrent atypical chest pain, bilateral neck pain that radiates into the jaw.  Denies runny or stuffy nose, dental pain, cough, shortness of breath.  Has had ER visits in August and September 2021 that resulted in negative work-up including troponins and chest x-rays, EKGs, labs. Has been seen by her PCP practice in October and is being managed for anxiety.  She would like recommendations for new PCP to help continue working up her chest pain getting management for anxiety.  No current facility-administered medications for this encounter.  Current Outpatient Medications:  .  escitalopram (LEXAPRO) 10 MG tablet, Take 1 tablet (10 mg total) by mouth daily., Disp: 30 tablet, Rfl: 0 .  hydrOXYzine (ATARAX/VISTARIL) 25 MG tablet, Take 2 tablets (50 mg total) by mouth every 6 (six) hours as needed., Disp: 12 tablet, Rfl: 0 .  omeprazole (PRILOSEC) 20 MG capsule, Take 1 capsule (20 mg total) by mouth daily., Disp: 30 capsule, Rfl: 0 .  medroxyPROGESTERone (DEPO-PROVERA) 150 MG/ML injection, Inject 150 mg into the muscle every 3 (three) months., Disp: , Rfl:    No Known Allergies  Past Medical History:  Diagnosis Date  . Anxiety   . Crohn's disease in remission [facility])    Hasn't had symptoms since 2004  . Gallstones   . Hammertoe 09/21/2014  . History of low transverse cesarean section 11/28/2015  . VBAC, delivered 03/19/2016     Past Surgical History:  Procedure Laterality Date  . CESAREAN SECTION N/A 06/16/2013   Procedure: CESAREAN SECTION;  Surgeon: Reva Bores, MD;  Location: [facility];  Service: Obstetrics;  Laterality: N/A;  . CHOLECYSTECTOMY  08/28/2012   Procedure: LAPAROSCOPIC CHOLECYSTECTOMY WITH INTRAOPERATIVE CHOLANGIOGRAM;  Surgeon: [name], DO;  Location: [facility] OR;  Service: General;  Laterality: N/A;  . INDUCED ABORTION  02/2012    Family  History  Problem Relation Age of Onset  . Other Neg Hx   . Diabetes Neg Hx   . Heart disease Neg Hx   . Hyperlipidemia Neg Hx   . Hypertension Neg Hx   . Stroke Neg Hx     Social History   Tobacco Use  . Smoking status: Never Smoker  . Smokeless tobacco: Never Used  Vaping Use  . Vaping Use: Never assessed  Substance Use Topics  . Alcohol use: Yes    Alcohol/week: 1.0 standard drink    Types: 1 Glasses of wine per week  . Drug use: No    ROS   Objective:   Vitals: BP (!) 143/77 (BP Location: Right Arm)   Pulse 76   Temp 98.1 F (36.7 C) (Oral)   Resp 16   SpO2 100%   Physical Exam Constitutional:      General: She is not in acute distress.    Appearance: Normal appearance. She is well-developed. She is not ill-appearing, toxic-appearing or diaphoretic.  HENT:     Head: Normocephalic and atraumatic.     Nose: Nose normal.     Mouth/Throat:     Mouth: Mucous membranes are moist.  Eyes:     Extraocular Movements: Extraocular movements intact.     Pupils: Pupils are equal, round, and reactive to light.  Cardiovascular:     Rate and Rhythm: Normal rate and regular rhythm.     Pulses: Normal pulses.     Heart sounds: Normal heart sounds. No  murmur heard.  No friction rub. No gallop.   Pulmonary:     Effort: Pulmonary effort is normal. No respiratory distress.     Breath sounds: Normal breath sounds. No stridor. No wheezing, rhonchi or rales.  Skin:    General: Skin is warm and dry.     Findings: No rash.  Neurological:     Mental Status: She is alert and oriented to person, place, and time.  Psychiatric:        Mood and Affect: Mood normal.        Behavior: Behavior normal.        Thought Content: Thought content normal.     ED ECG REPORT   Date: 10/18/2020  Rate: 85bpm  Rhythm: normal sinus rhythm  QRS Axis: right  Intervals: normal  ST/T Wave abnormalities: nonspecific T wave changes  Conduction Disutrbances:none  Narrative Interpretation:  Nonspecific T wave in lead II, T wave version in lead III, right axis deviation but otherwise sinus rhythm at 85 bpm.  Comparable to previous EKG.  Old EKG Reviewed: unchanged  I have personally reviewed the EKG tracing and agree with the computerized printout as noted.  DG Chest 2 View  Result Date: 08/22/2020 CLINICAL DATA:  Chest pain EXAM: CHEST - 2 VIEW COMPARISON:  08/11/2020 FINDINGS: Lungs are clear.  No pleural effusion or pneumothorax. The heart is normal in size. Visualized osseous structures are within normal limits. IMPRESSION: Normal chest radiographs. Electronically Signed   By: Charline Bills M.D.   On: 08/22/2020 06:30   DG Chest 2 View  Result Date: 08/11/2020 CLINICAL DATA:  Left-sided chest pain for 24 hours. EXAM: CHEST - 2 VIEW COMPARISON:  05/27/2019 FINDINGS: The heart size and mediastinal contours are within normal limits. Both lungs are clear. No evidence of pneumothorax or pleural effusion. The visualized skeletal structures are unremarkable. IMPRESSION: No active cardiopulmonary disease. Electronically Signed   By: Danae Orleans M.D.   On: 08/11/2020 08:37    Recent Results (from the past 2160 hour(s))  Basic metabolic panel     Status: Abnormal   Collection Time: 08/11/20  8:19 AM  Result Value Ref Range   Sodium 140 135 - 145 mmol/L   Potassium 3.6 3.5 - 5.1 mmol/L   Chloride 107 98 - 111 mmol/L   CO2 19 (L) 22 - 32 mmol/L   Glucose, Bld 85 70 - 99 mg/dL    Comment: Glucose reference range applies only to samples taken after fasting for at least 8 hours.   BUN 7 6 - 20 mg/dL   Creatinine, Ser 4.08 0.44 - 1.00 mg/dL   Calcium 9.6 8.9 - 14.4 mg/dL   GFR calc non Af Amer >60 >60 mL/min   GFR calc Af Amer >60 >60 mL/min   Anion gap 14 5 - 15    Comment: Performed at [facility], [address] [place], [place] [address]  CBC     Status: None   Collection Time: 08/11/20  8:19 AM  Result Value Ref Range   WBC 8.8 4.0 - 10.5 K/uL   RBC 4.54 3.87 -  5.11 MIL/uL   Hemoglobin 13.2 12.0 - 15.0 g/dL   HCT 31.4 36 - 46 %   MCV 89.2 80.0 - 100.0 fL   MCH 29.1 26.0 - 34.0 pg   MCHC 32.6 30.0 - 36.0 g/dL   RDW 97.0 26.3 - 78.5 %   Platelets 282 150 - 400 K/uL   nRBC 0.0 0.0 -  0.2 %    Comment: Performed at [facility], [address] [place], [place] [address]  Troponin I (High Sensitivity)     Status: None   Collection Time: 08/11/20  8:19 AM  Result Value Ref Range   Troponin I (High Sensitivity) <2 <18 ng/L    Comment: (NOTE) Elevated high sensitivity troponin I (hsTnI) values and significant  changes across serial measurements may suggest ACS but many other  chronic and acute conditions are known to elevate hsTnI results.  Refer to the "Links" section for chest pain algorithms and additional  guidance. Performed at [facility], [address] [place], [place] [address]   I-Stat beta hCG blood, ED     Status: None   Collection Time: 08/11/20  8:32 AM  Result Value Ref Range   I-stat hCG, quantitative <5.0 <5 mIU/mL   Comment 3            Comment:   GEST. AGE      CONC.  (mIU/mL)   <=1 WEEK        5 - 50     2 WEEKS       50 - 500     3 WEEKS       100 - 10,000     4 WEEKS     1,000 - 30,000        FEMALE AND NON-PREGNANT FEMALE:     LESS THAN 5 mIU/mL   Troponin I (High Sensitivity)     Status: None   Collection Time: 08/11/20 12:00 PM  Result Value Ref Range   Troponin I (High Sensitivity) <2 <18 ng/L    Comment: (NOTE) Elevated high sensitivity troponin I (hsTnI) values and significant  changes across serial measurements may suggest ACS but many other  chronic and acute conditions are known to elevate hsTnI results.  Refer to the "Links" section for chest pain algorithms and additional  guidance. Performed at [facility], [address], [place], [place] [address]   Basic metabolic panel     Status: Abnormal   Collection Time: 08/22/20  5:58 AM  Result Value Ref Range   Sodium 136 135 - 145  mmol/L   Potassium 3.4 (L) 3.5 - 5.1 mmol/L   Chloride 106 98 - 111 mmol/L   CO2 17 (L) 22 - 32 mmol/L   Glucose, Bld 130 (H) 70 - 99 mg/dL    Comment: Glucose reference range applies only to samples taken after fasting for at least 8 hours.   BUN 12 6 - 20 mg/dL   Creatinine, Ser 9.14 0.44 - 1.00 mg/dL   Calcium 9.7 8.9 - 78.2 mg/dL   GFR calc non Af Amer >60 >60 mL/min   GFR calc Af Amer >60 >60 mL/min   Anion gap 13 5 - 15    Comment: Performed at [facility], [address] [place], [place] [address]  CBC     Status: None   Collection Time: 08/22/20  5:58 AM  Result Value Ref Range   WBC 9.1 4.0 - 10.5 K/uL   RBC 4.54 3.87 - 5.11 MIL/uL   Hemoglobin 13.5 12.0 - 15.0 g/dL   HCT 30.8 36 - 46 %   MCV 87.7 80.0 - 100.0 fL   MCH 29.7 26.0 - 34.0 pg   MCHC 33.9 30.0 - 36.0 g/dL   RDW 65.7 84.6 - 96.2 %   Platelets 351 150 - 400 K/uL  nRBC 0.0 0.0 - 0.2 %    Comment: Performed at [facility], [address], [place], [place] [address]  Troponin I (High Sensitivity)     Status: None   Collection Time: 08/22/20  5:58 AM  Result Value Ref Range   Troponin I (High Sensitivity) 3 <18 ng/L    Comment: (NOTE) Elevated high sensitivity troponin I (hsTnI) values and significant  changes across serial measurements may suggest ACS but many other  chronic and acute conditions are known to elevate hsTnI results.  Refer to the "Links" section for chest pain algorithms and additional  guidance. Performed at [facility], [address], [place], [place] [address]   I-Stat beta hCG blood, ED     Status: None   Collection Time: 08/22/20  6:05 AM  Result Value Ref Range   I-stat hCG, quantitative <5.0 <5 mIU/mL   Comment 3            Comment:   GEST. AGE      CONC.  (mIU/mL)   <=1 WEEK        5 - 50     2 WEEKS       50 - 500     3 WEEKS       100 - 10,000     4 WEEKS     1,000 - 30,000        FEMALE AND NON-PREGNANT FEMALE:     LESS THAN 5 mIU/mL   Troponin I  (High Sensitivity)     Status: None   Collection Time: 08/22/20  8:40 AM  Result Value Ref Range   Troponin I (High Sensitivity) 3 <18 ng/L    Comment: (NOTE) Elevated high sensitivity troponin I (hsTnI) values and significant  changes across serial measurements may suggest ACS but many other  chronic and acute conditions are known to elevate hsTnI results.  Refer to the "Links" section for chest pain algorithms and additional  guidance. Performed at [facility], [address] [place], [place] [address]   Cytology - PAP( [facility])     Status: Abnormal   Collection Time: 08/29/20  4:54 PM  Result Value Ref Range   High risk HPV Positive (A)    Neisseria Gonorrhea Negative    Chlamydia Negative    Trichomonas Negative    HPV 16 Negative    HPV 18 / 45 Positive (A)    Adequacy      Satisfactory for evaluation; transformation zone component PRESENT.   Diagnosis      - Negative for Intraepithelial Lesions or Malignancy (NILM)   Diagnosis - Benign reactive/reparative changes    Microorganisms Shift in flora suggestive of bacterial vaginosis    Comment Normal Reference Range HPV - Negative    Comment Normal Reference Range Trichomonas - Negative    Comment Normal Reference Ranger Chlamydia - Negative    Comment      Normal Reference Range Neisseria Gonorrhea - Negative   Comment Normal Reference Range HPV 16 18 45 -Negative   Hepatitis B surface antigen     Status: None   Collection Time: 08/29/20  5:09 PM  Result Value Ref Range   Hepatitis B Surface Ag Negative Negative  Hepatitis C antibody     Status: None   Collection Time: 08/29/20  5:09 PM  Result Value Ref Range   Hep C Virus Ab <0.1 0.0 - 0.9 s/co ratio    Comment:  Negative:     < 0.8                              Indeterminate: 0.8 - 0.9                                   Positive:     > 0.9  The CDC recommends that a positive HCV antibody result  be followed up with a HCV  Nucleic Acid Amplification  test (277824).   HIV Antibody (routine testing w rflx)     Status: None   Collection Time: 08/29/20  5:09 PM  Result Value Ref Range   HIV Screen 4th Generation wRfx Non Reactive Non Reactive  RPR     Status: None   Collection Time: 08/29/20  5:09 PM  Result Value Ref Range   RPR Ser Ql Non Reactive Non Reactive    Assessment and Plan :   I have reviewed the PDMP during this encounter.  1. Atypical chest pain   2. Neck pain   3. Anxiety     Patient has reassuring physical exam findings, recent negative work-up, normal EKG.  Suspect that anxiety is still playing a role with her atypical symptoms.  Recommended following instructions to start taking Lexapro, maintain hydroxyzine.  Provide her with information for new PCP to establish care and continued work-up.  Recommended that she do request lab work-up for endocrine sources such as her thyroid and adrenal glands. Counseled patient on potential for adverse effects with medications prescribed/recommended today, ER and return-to-clinic precautions discussed, patient verbalized understanding.    Wallis Bamberg, PA-C 10/18/20 1715

## 2020-10-28 ENCOUNTER — Emergency Department (HOSPITAL_COMMUNITY): Payer: BC Managed Care – PPO

## 2020-10-28 ENCOUNTER — Other Ambulatory Visit: Payer: Self-pay

## 2020-10-28 ENCOUNTER — Ambulatory Visit: Payer: BC Managed Care – PPO | Admitting: Family Medicine

## 2020-10-28 ENCOUNTER — Encounter (HOSPITAL_COMMUNITY): Payer: Self-pay | Admitting: Emergency Medicine

## 2020-10-28 ENCOUNTER — Encounter: Payer: Self-pay | Admitting: Family Medicine

## 2020-10-28 ENCOUNTER — Emergency Department (HOSPITAL_COMMUNITY)
Admission: EM | Admit: 2020-10-28 | Discharge: 2020-10-28 | Disposition: A | Discharge status: Home / Self Care | Payer: BC Managed Care – PPO | Attending: Emergency Medicine | Admitting: Emergency Medicine

## 2020-10-28 DIAGNOSIS — N939 Abnormal uterine and vaginal bleeding, unspecified: Secondary | ICD-10-CM | POA: Diagnosis not present

## 2020-10-28 DIAGNOSIS — K219 Gastro-esophageal reflux disease without esophagitis: Secondary | ICD-10-CM | POA: Diagnosis not present

## 2020-10-28 DIAGNOSIS — F419 Anxiety disorder, unspecified: Secondary | ICD-10-CM | POA: Diagnosis not present

## 2020-10-28 DIAGNOSIS — R079 Chest pain, unspecified: Secondary | ICD-10-CM | POA: Diagnosis not present

## 2020-10-28 DIAGNOSIS — R0602 Shortness of breath: Secondary | ICD-10-CM | POA: Diagnosis not present

## 2020-10-28 LAB — CBC
HCT: 39.5 % (ref 36.0–46.0)
Hemoglobin: 13 g/dL (ref 12.0–15.0)
MCH: 29.3 pg (ref 26.0–34.0)
MCHC: 32.9 g/dL (ref 30.0–36.0)
MCV: 89.2 fL (ref 80.0–100.0)
Platelets: 301 10*3/uL (ref 150–400)
RBC: 4.43 MIL/uL (ref 3.87–5.11)
RDW: 12.5 % (ref 11.5–15.5)
WBC: 6 10*3/uL (ref 4.0–10.5)
nRBC: 0 % (ref 0.0–0.2)

## 2020-10-28 LAB — BASIC METABOLIC PANEL
Anion gap: 13 (ref 5–15)
BUN: 7 mg/dL (ref 6–20)
CO2: 18 mmol/L — ABNORMAL LOW (ref 22–32)
Calcium: 10.2 mg/dL (ref 8.9–10.3)
Chloride: 109 mmol/L (ref 98–111)
Creatinine, Ser: 0.72 mg/dL (ref 0.44–1.00)
GFR, Estimated: >60 mL/min (ref >60)
Glucose, Bld: 84 mg/dL (ref 70–99)
Potassium: 3.3 mmol/L — ABNORMAL LOW (ref 3.5–5.1)
Sodium: 140 mmol/L (ref 135–145)

## 2020-10-28 LAB — I-STAT BETA HCG BLOOD, ED (MC, WL, AP ONLY): I-stat hCG, quantitative: <5 m[IU]/mL (ref <5)

## 2020-10-28 LAB — TROPONIN I (HIGH SENSITIVITY)
Troponin I (High Sensitivity): <2 ng/L (ref <18)
Troponin I (High Sensitivity): <2 ng/L (ref <18)

## 2020-10-28 MED ORDER — LIDOCAINE VISCOUS HCL 2 % MT SOLN
15.0000 mL | Freq: Once | OROMUCOSAL | Status: AC
  Administered 2020-10-28: 15 mL via ORAL
  Filled 2020-10-28: qty 15

## 2020-10-28 MED ORDER — ALUM & MAG HYDROXIDE-SIMETH 200-200-20 MG/5ML PO SUSP
30.0000 mL | Freq: Once | ORAL | Status: AC
  Administered 2020-10-28: 30 mL via ORAL
  Filled 2020-10-28: qty 30

## 2020-10-28 MED ORDER — ONDANSETRON 4 MG PO TBDP
4.0000 mg | ORAL_TABLET | Freq: Once | ORAL | Status: AC
  Administered 2020-10-28: 4 mg via ORAL
  Filled 2020-10-28: qty 1

## 2020-10-28 MED ORDER — FAMOTIDINE 20 MG PO TABS: 20.0000 mg | ORAL_TABLET | Freq: Two times a day (BID) | ORAL | 1 refills | Status: DC

## 2020-10-28 MED ORDER — POTASSIUM CHLORIDE CRYS ER 20 MEQ PO TBCR
20.0000 meq | EXTENDED_RELEASE_TABLET | Freq: Once | ORAL | Status: AC
  Administered 2020-10-28: 20 meq via ORAL
  Filled 2020-10-28: qty 1

## 2020-10-28 MED ORDER — ONDANSETRON 4 MG PO TBDP: 4.0000 mg | ORAL_TABLET | Freq: Three times a day (TID) | ORAL | 0 refills | Status: DC | PRN

## 2020-10-28 MED ORDER — OMEPRAZOLE 20 MG PO CPDR: 20.0000 mg | DELAYED_RELEASE_CAPSULE | Freq: Two times a day (BID) | ORAL | 0 refills | Status: DC

## 2020-10-28 NOTE — ED Triage Notes (Addendum)
Pt reports chest pain and sob, states she gets sob just talking and when she is up  Moving around. Reports symptoms have been ongoing for the past few weeks, seen at urgent care on 11/2 for the same. Hx of acid reflux and anxiety.

## 2020-10-28 NOTE — ED Notes (Addendum)
Pt appears anxious, reports chest discomfort x1 week with left neck and left head pain. Pt reports increased burping and gas that is not relieved with omeprazole that she takes for acid reflux. Mild SOB on exertion. Pain lessoned by drinking water and burping. Pt takes hydralazine for anxiety, one dose today.

## 2020-10-28 NOTE — ED Provider Notes (Signed)
[facility] EMERGENCY DEPARTMENT Provider Note   CSN: [id number] Arrival date & time: 10/28/20  0759     History Chief Complaint  Patient presents with  . Chest Pain  . Shortness of Breath    Katherine Lindsey is a 34 y.o. female with past medical history of anxiety, history of Crohn's in remission, acid reflux that presents the emergency department today for chest pain and nausea.  Patient states that she has had symptoms of burning chest pain with continuous burping since September, often feels nauseous and has lost about 10 pounds since September.  Patient states that she struggles with anxiety, has been taking Atarax which has not really been helping her, states that she is seen her PCP 3 times for similar symptoms and continuously is told to take her omeprazole and her Atarax, states that "there has to be something else going on."  States that no one will refer her to GI.  Denies any fevers, chills, night sweats, cough, myalgias.  Patient denies any recent long travel, calf pain, calf tenderness, recent surgery, chance of pregnancy.  Per chart review patient was seen for recurrent atypical chest pain 10 days ago by [name] urgent care, had ER visits also in August and September 2021 that resulted in negative work-up including negative troponins, negative chest x-rays and negative EKGs.  Also seen in October by her PCP for anxiety.  She was recommended to start Lexapro.  Patient states that she came here because she wants resolution, has been constantly going to different PCPs and urgent cares.  States that chest burning is worse at night when she is laying down, denies any regurgitation.  Denies any specific food triggers that she is aware of.  Denies any abdominal pain.  Denies any dysuria or hematuria.  States that she has felt nauseous therefore has not been eating as much.  States that she has been having normal bowel movements.  Chest pain is not worse upon exertion,  does not radiate anywhere, is not pleuritic.  No alcohol use, no drug use.  HPI     Past Medical History:  Diagnosis Date  . Anxiety   . Crohn's disease in remission [facility])    Hasn't had symptoms since 2004  . Gallstones   . Hammertoe 09/21/2014  . History of low transverse cesarean section 11/28/2015  . VBAC, delivered 03/19/2016    Patient Active Problem List   Diagnosis Date Noted  . Gastroesophageal reflux disease 08/29/2020  . Nonintractable episodic headache 01/06/2020  . Anxiety 07/15/2019  . Hidradenitis suppurativa 07/15/2019  . High risk HPV infection 02/07/2017    Past Surgical History:  Procedure Laterality Date  . CESAREAN SECTION N/A 06/16/2013   Procedure: CESAREAN SECTION;  Surgeon: Reva Bores, MD;  Location: [facility];  Service: Obstetrics;  Laterality: N/A;  . CHOLECYSTECTOMY  08/28/2012   Procedure: LAPAROSCOPIC CHOLECYSTECTOMY WITH INTRAOPERATIVE CHOLANGIOGRAM;  Surgeon: [name], DO;  Location: [facility] OR;  Service: General;  Laterality: N/A;  . INDUCED ABORTION  02/2012     OB History    Gravida  4   Para  3   Term  3   Preterm      AB  1   Living  3     SAB  0   TAB  1   Ectopic      Multiple  0   Live Births  3        Obstetric Comments  2014 LTCS with  extension of hysterotomy inferiorly        Family History  Problem Relation Age of Onset  . Other Neg Hx   . Diabetes Neg Hx   . Heart disease Neg Hx   . Hyperlipidemia Neg Hx   . Hypertension Neg Hx   . Stroke Neg Hx     Social History   Tobacco Use  . Smoking status: Never Smoker  . Smokeless tobacco: Never Used  Vaping Use  . Vaping Use: Never assessed  Substance Use Topics  . Alcohol use: Yes    Alcohol/week: 1.0 standard drink    Types: 1 Glasses of wine per week  . Drug use: No    Home Medications Prior to Admission medications   Medication Sig Start Date End Date Taking? Authorizing Provider  escitalopram (LEXAPRO) 10 MG tablet Take 1 tablet (10 mg  total) by mouth daily. 08/26/20   Autry-Lott, Randa Evens, DO  hydrOXYzine (ATARAX/VISTARIL) 25 MG tablet Take 2 tablets (50 mg total) by mouth every 6 (six) hours as needed. 08/22/20   Walisiewicz, Caroleen Hamman, PA-C  medroxyPROGESTERone (DEPO-PROVERA) 150 MG/ML injection Inject 150 mg into the muscle every 3 (three) months.    [provider]  omeprazole (PRILOSEC) 20 MG capsule Take 1 capsule (20 mg total) by mouth daily. 08/26/20   Autry-Lott, Randa Evens, DO  ondansetron (ZOFRAN ODT) 4 MG disintegrating tablet Take 1 tablet (4 mg total) by mouth every 8 (eight) hours as needed for nausea or vomiting. 10/28/20   Farrel Gordon, PA-C  tiZANidine (ZANAFLEX) 4 MG tablet Take 1 tablet (4 mg total) by mouth every 8 (eight) hours as needed. 10/18/20   Wallis Bamberg, PA-C  promethazine (PHENERGAN) 25 MG tablet Take 1 tablet (25 mg total) by mouth every 8 (eight) hours as needed (headache). Patient not taking: Reported on 07/14/2020 03/28/20 08/16/20  Gilda Crease, MD  SUMAtriptan (IMITREX) 50 MG tablet Take 1 tablet (50 mg total) by mouth every 2 (two) hours as needed for migraine. May repeat in 2 hours if headache persists or recurs. Patient not taking: Reported on 07/14/2020 01/27/20 08/16/20  Gailen Shelter, PA    Allergies    Patient has no known allergies.  Review of Systems   Review of Systems  Constitutional: Negative for chills, diaphoresis, fatigue and fever.  HENT: Negative for congestion, sore throat and trouble swallowing.   Eyes: Negative for pain and visual disturbance.  Respiratory: Negative for cough, shortness of breath and wheezing.   Cardiovascular: Positive for chest pain. Negative for palpitations and leg swelling.  Gastrointestinal: Positive for nausea. Negative for abdominal distention, abdominal pain, diarrhea and vomiting.  Genitourinary: Negative for difficulty urinating.  Musculoskeletal: Negative for back pain, neck pain and neck stiffness.  Skin: Negative for pallor.    Neurological: Negative for dizziness, speech difficulty, weakness and headaches.  Psychiatric/Behavioral: Negative for confusion.    Physical Exam Updated Vital Signs BP 122/73   Pulse (!) 54   Temp 98.3 F (36.8 C)   Resp 18   Ht 5\' 8"  (1.727 m)   Wt 72.6 kg   SpO2 100%   BMI 24.33 kg/m   Physical Exam Constitutional:      General: She is not in acute distress.    Appearance: Normal appearance. She is not ill-appearing, toxic-appearing or diaphoretic.     Comments: Pleasant female, appears anxious.  HENT:     Mouth/Throat:     Mouth: Mucous membranes are moist.     Pharynx: Oropharynx is  clear.  Eyes:     General: No scleral icterus.    Extraocular Movements: Extraocular movements intact.     Pupils: Pupils are equal, round, and reactive to light.  Cardiovascular:     Rate and Rhythm: Normal rate and regular rhythm.     Pulses: Normal pulses.     Heart sounds: Normal heart sounds.  Pulmonary:     Effort: Pulmonary effort is normal. No respiratory distress.     Breath sounds: Normal breath sounds. No stridor. No wheezing, rhonchi or rales.  Chest:     Chest wall: Tenderness present.  Abdominal:     General: Abdomen is flat. There is no distension.     Palpations: Abdomen is soft.     Tenderness: There is no abdominal tenderness. There is no guarding or rebound.  Musculoskeletal:        General: No swelling or tenderness. Normal range of motion.     Cervical back: Normal range of motion and neck supple. No rigidity.     Right lower leg: No edema.     Left lower leg: No edema.  Skin:    General: Skin is warm and dry.     Capillary Refill: Capillary refill takes less than 2 seconds.     Coloration: Skin is not pale.  Neurological:     General: No focal deficit present.     Mental Status: She is alert and oriented to person, place, and time.  Psychiatric:        Mood and Affect: Mood normal.        Behavior: Behavior normal.     ED Results / Procedures /  Treatments   Labs (all labs ordered are listed, but only abnormal results are displayed) Labs Reviewed  BASIC METABOLIC PANEL - Abnormal; Notable for the following components:      Result Value   Potassium 3.3 (*)    CO2 18 (*)    All other components within normal limits  CBC  I-STAT BETA HCG BLOOD, ED ([facility], [facility], [facility] ONLY)  TROPONIN I (HIGH SENSITIVITY)  TROPONIN I (HIGH SENSITIVITY)    EKG EKG Interpretation  Date/Time:  Friday October 28 2020 08:01:00 EST Ventricular Rate:  88 PR Interval:  140 QRS Duration: 68 QT Interval:  348 QTC Calculation: 421 R Axis:   80 Text Interpretation: Normal sinus rhythm T wave abnormality Artifact No significant change since last tracing Abnormal ECG Confirmed by Gerhard Munch [phone]) on 10/28/2020 8:17:02 AM   Radiology DG Chest 2 View  Result Date: 10/28/2020 CLINICAL DATA:  Chest pain and shortness of breath. EXAM: CHEST - 2 VIEW COMPARISON:  08/22/2020 FINDINGS: Heart and mediastinal shadows are normal. Question mild central bronchial thickening. No infiltrate, collapse or effusion. Mild chronic spinal curvature. Cholecystectomy clips right upper quadrant. IMPRESSION: Question mild central bronchial thickening. No consolidation or collapse. Electronically Signed   By: Paulina Fusi M.D.   On: 10/28/2020 08:43    Procedures Procedures (including critical care time)  Medications Ordered in ED Medications  potassium chloride SA (KLOR-CON) CR tablet 20 mEq (has no administration in time range)  alum & mag hydroxide-simeth (MAALOX/MYLANTA) 200-200-20 MG/5ML suspension 30 mL (30 mLs Oral Given 10/28/20 1005)    And  lidocaine (XYLOCAINE) 2 % viscous mouth solution 15 mL (15 mLs Oral Given 10/28/20 1005)  ondansetron (ZOFRAN-ODT) disintegrating tablet 4 mg (4 mg Oral Given 10/28/20 1005)    ED Course  I have reviewed the triage vital signs and the nursing  notes.  Pertinent labs & imaging results that were available during my care of  the patient were reviewed by me and considered in my medical decision making (see chart for details).    MDM Rules/Calculators/A&P                         TAKEYSHA BEERMAN is a 34 y.o. female with past medical history of anxiety, history of Crohn's in remission, acid reflux that presents the emergency department today for chest pain and nausea.  Patient symptoms suggestive of acid reflux and anxiety, unlikely to be ACS there is some reproducible component of chest pain, however will get full ACS work-up at this time and reevaluate.  GI cocktail given.  Patient is extremely anxious on exam.  Work-up today benign, unremarkable CBC, BMP, first and second troponin negative.  Heart score 1.  Potassium 3.3, did replete here today.  Upon reevaluation patient states that she feels much better with GI cocktail.  Will have patient referred to GI, and follow-up with PCP.  Patient to be discharged at this time, strict return precautions given.  I think patient's acid reflux has not been well controlled and it causes her to have extreme anxiety, I did discuss this with her and she is in agreement.  Patient appears reliable.  Do not think there are any emergent causes of patient's symptoms at this time, however I do think that patient would benefit from endoscopy and GI referral.  Patient is to be discharged with recommendation to follow up with PCP in regards to today's hospital visit. Chest pain is not likely of cardiac or pulmonary etiology d/t presentation, PERC negative, VSS, no tracheal deviation, no JVD or new murmur, RRR, breath sounds equal bilaterally, EKG without acute abnormalities, negative troponin, and negative CXR. Pt has been advised to return to the ED if CP becomes exertional, associated with diaphoresis or nausea, radiates to left jaw/arm, worsens or becomes concerning in any way. Pt appears reliable for follow up and is agreeable to discharge.   Doubt need for further emergent work up at this  time. I explained the diagnosis and have given explicit precautions to return to the ER including for any other new or worsening symptoms. The patient understands and accepts the medical plan as it's been dictated and I have answered their questions. Discharge instructions concerning home care and prescriptions have been given. The patient is STABLE and is discharged to home in good condition.     Final Clinical Impression(s) / ED Diagnoses Final diagnoses:  Gastroesophageal reflux disease, unspecified whether esophagitis present  Anxiety    Rx / DC Orders ED Discharge Orders         Ordered    Ambulatory referral to Gastroenterology        10/28/20 1101    ondansetron (ZOFRAN ODT) 4 MG disintegrating tablet  Every 8 hours PRN        10/28/20 1101           Farrel Gordon, PA-C 10/28/20 1135    Sabino Donovan, MD 10/28/20 2039

## 2020-10-28 NOTE — Patient Instructions (Signed)
It was nice to meet you today,  We made the following changes today:  -I would like you to start taking the Lexapro at night before bed.  You can start off with 5 mg by cutting the tablet in half if you would like, but I would transition to the full 10 mg tablet within a week.  Please give this medication at least 4 weeks to take effect.  -For your gas/dyspepsia symptoms, I would like you to take the omeprazole twice a day, once with breakfast and once with supper.  I have also prescribed you a medicine called famotidine that I would also like you to take in the morning with your first dose of omeprazole.  -I have also referred you to the gastroenterologist for further evaluation.  If you have not heard from anybody in 1 week, please call our office.  I would like you to schedule an appointment with your PCP, Dr. Dareen Piano in approximately 4- 6 weeks.  Have a great day,  Frederic Jericho, MD

## 2020-10-28 NOTE — Discharge Instructions (Signed)
You are seen today for acid reflux and anxiety.  Your work-up was reassuring, your potassium is slightly low, we did replete that here today.  I attached instructions in regards to managing anxiety, food choices for acid reflux and potassium rich foods.  Please start your Lexapro as recommended, remember to take your Atarax as needed.  I want you to follow-up with a GI doctor, they should call you within a week, or if they do not please call them, their information is attached.  Please come back to the emergency department for any new or worsening concerning symptoms as we discussed.

## 2020-10-28 NOTE — Progress Notes (Signed)
SUBJECTIVE:   CHIEF COMPLAINT / HPI:   Gerd: Patient states "she does not have an update ".  She does not eat dinner very often because of anxiety and lack of appetite.  She has pain in the left upper quadrant and at the left sternal border.  She states she has lost over 10 pounds unintentionally.  Patient states she "used to have Crohn's in high school".  She states that her last endoscopy showed resolution of her Crohn's disease.  She has not been to the [employer] in several years.  Anxiety: Patient feels her anxiety is worsening.  She does not have a specific phobia that she knows of or trigger for her anxiety.   She went to the emergency department this morning due to concerns of chest pain, as she has done previously in the past.  No cardiac cause was found.  She never started her Lexapro because she was worried about the side effects.  She takes hydroxyzine daily to help with symptoms.No history of bipolar disorder or bipolar symptoms such as decreased sleep without feeling tired.   Vaginal spotting: Patient has been on Depo-Provera for 8 years now.  Normally does not have a period, but after the most recent injection in September, she has had vaginal spotting that she states is minor.  She uses 1-2 liners a day.  Does not soak through.  She states that a provider mentioned something to her about hormone levels and now she is concerned about whether her estrogen levels are normal or not.  She felt like her anxiety worsened after getting the depo shot.    PERTINENT  PMH / PSH: anxiety  OBJECTIVE:   BP 120/76   Pulse 83   Ht 5\' 8"  (1.727 m)   Wt 158 lb 9.6 oz (71.9 kg)   SpO2 99%   BMI 24.12 kg/m   General: Alert, oriented.  No physical distress CV Regular rate and rhythm.  No murmurs Pulmonary: Lungs clear auscultation bilaterally GI: Soft, nontender.  Normal bowel sounds Psych: Patient appears nervous.  Eye contact is good.  Speech is spontaneous.  ASSESSMENT/PLAN:    Gastroesophageal reflux disease Patient seen in the ED today for chest pain that was believed to be caused by reflux.  She was given Zofran in the ED.  She has been taking omeprazole 20 mg daily.  Given her weight loss and history of Crohn's, I put in a new referral for gastroenterologist to evaluate if she needs endoscopy.  She has not been evaluated with endoscopy in several years.  I also increased the omeprazole to twice a day and added famotidine to take once a day  In the morning.  I advised her to wait at least 3 hours between taking the evening dose of omeprazole and taking her Lexapro.  Anxiety  Patient states she is willing to try the Lexapro.  No history of bipolar disorder.  I discussed the side effects with her and the most common side effect is GI discomfort, which we discussed would most likely improve after the first 2 weeks.  Patient willing to try this medication.  I advised her she can continue taking the Atarax in the meantime for specific anxiety episodes.  Vaginal spotting Patient states this had not happened to her before, but previous note from 2015 indicated she had experienced spotting previously.  Spotting is mild at this time.  Held off on starting OCPs today as our focus was her GI symptoms/weight loss, and  her anxiety.  Has an appointment in 2 weeks with PCP.  If still experiencing spotting at time can consider starting a trial of OCPs to help regulate bleeding.     Sandre Kitty, MD [facility]

## 2020-10-30 MED ORDER — FAMOTIDINE 20 MG PO TABS: 20.0000 mg | ORAL_TABLET | Freq: Every day | ORAL | 1 refills | Status: DC

## 2020-10-30 NOTE — Assessment & Plan Note (Signed)
Patient seen in the ED today for chest pain that was believed to be caused by reflux.  She was given Zofran in the ED.  She has been taking omeprazole 20 mg daily.  Given her weight loss and history of Crohn's, I put in a new referral for gastroenterologist to evaluate if she needs endoscopy.  She has not been evaluated with endoscopy in several years.  I also increased the omeprazole to twice a day and added famotidine to take once a day  In the morning.  I advised her to wait at least 3 hours between taking the evening dose of omeprazole and taking her Lexapro.

## 2020-10-30 NOTE — Assessment & Plan Note (Signed)
Patient states this had not happened to her before, but previous note from 2015 indicated she had experienced spotting previously.  Spotting is mild at this time.  Held off on starting OCPs today as our focus was her GI symptoms/weight loss, and her anxiety.  Has an appointment in 2 weeks with PCP.  If still experiencing spotting at time can consider starting a trial of OCPs to help regulate bleeding.

## 2020-10-30 NOTE — Assessment & Plan Note (Signed)
Patient states she is willing to try the Lexapro.  No history of bipolar disorder.  I discussed the side effects with her and the most common side effect is GI discomfort, which we discussed would most likely improve after the first 2 weeks.  Patient willing to try this medication.  I advised her she can continue taking the Atarax in the meantime for specific anxiety episodes.

## 2020-11-02 ENCOUNTER — Telehealth: Payer: Self-pay | Admitting: Family Medicine

## 2020-11-02 NOTE — Telephone Encounter (Signed)
After-hours call Patient called the after-hours line out of concerns for medication side effects.  She was started on Lexapro last Friday and reports that since she started she has felt "weird".  She reports nausea, a strange burning abdominal pain, and she had an anxiety attack yesterday.  She is unsure if these are all side effects from the Lexapro and if they are normal.  She would like to have her follow-up appointment moved up so that she can discuss other options for her anxiety.  I instructed to discontinue taking the Lexapro tonight and scheduled her for a follow-up appointment with Dr. Constance Goltz tomorrow at 9:10 AM.  Patient denies any SI or HI.  Strict ED precautions given.  Patient is agreeable

## 2020-11-02 NOTE — Progress Notes (Signed)
    SUBJECTIVE:   CHIEF COMPLAINT / HPI:   Anxiety f/u: patient had stomach discomfort from her medication, which caused her to have increased anxiety.  She is complaining of blurred vision, difficulty sleeping and subsequent fatigue.  She did not take the medication last night. Her symptoms started the second day she started taking it.  She ha had nausea with no vomiting and some diarrhea.  She has not taken her atarax since starting the lexapro.    PERTINENT  PMH / PSH: anxiety.   OBJECTIVE:   BP 114/82   Pulse 66   Ht 5\' 8"  (1.727 m)   Wt 154 lb 9.6 oz (70.1 kg)   SpO2 99%   BMI 23.51 kg/m   Gen: alert, oriented.  Cv: RRR.  Pulm: LCTAB.   Psych: anxious appearing. Spontaneous speech.    ASSESSMENT/PLAN:   Anxiety Pt having difficulty tolerating her lexapro.  Encouraged pt to attempt to continue taking her medication and allow her body to adjust to the medication.   - continue taking medication  - increase pepcid to 40mg  - advised pt to use atarax PRN TID while adjusting to the lexapro - f/u visit on December 2nd.       , MD [facility]

## 2020-11-03 ENCOUNTER — Encounter: Payer: Self-pay | Admitting: Family Medicine

## 2020-11-03 ENCOUNTER — Other Ambulatory Visit: Payer: Self-pay

## 2020-11-03 ENCOUNTER — Ambulatory Visit (INDEPENDENT_AMBULATORY_CARE_PROVIDER_SITE_OTHER): Payer: BC Managed Care – PPO | Admitting: Family Medicine

## 2020-11-03 DIAGNOSIS — F419 Anxiety disorder, unspecified: Secondary | ICD-10-CM

## 2020-11-03 NOTE — Assessment & Plan Note (Signed)
Pt having difficulty tolerating her lexapro.  Encouraged pt to attempt to continue taking her medication and allow her body to adjust to the medication.   - continue taking medication  - increase pepcid to 40mg  - advised pt to use atarax PRN TID while adjusting to the lexapro - f/u visit on December 2nd.

## 2020-11-03 NOTE — Patient Instructions (Addendum)
It was nice to see you again today,  We decided to make the following changes today: -Continue taking your Lexapro until you see me again.  Take it at night as you are doing. -Increase your famotidine to 40 mg and continue taking the omeprazole as already prescribed. -For anxiety in the short-term, you can continue to take the Atarax as needed up to 3 times a day. -Do not take any medications such as ibuprofen, Advil, BC powder, aspirin as these can make your stomach discomfort worse. -You can take up to 1000 mg of Tylenol at a time up to 3 times a day for your headaches.  Consider getting your eyes checked with your optometrist---this can be a cause of blurry vision  I will see you back in December or sooner if needed.  Have a great day,  Frederic Jericho, MD

## 2020-11-14 ENCOUNTER — Ambulatory Visit: Payer: BC Managed Care – PPO | Admitting: Student in an Organized Health Care Education/Training Program

## 2020-11-16 NOTE — Progress Notes (Signed)
    SUBJECTIVE:   CHIEF COMPLAINT / HPI:   Anxiety:Patient feels like the medication may be making her more anxious but also states that the symptoms of chest pain associated with anxiety she was having have improved.  She still has stomach discomfort with the medication but no vomiting.  She has had weight loss but this predates her starting the medication for anxiety.  She is still taking the Pepcid and Atarax.  Patient would like to stick with the medication until our visit 2 weeks from now.  PERTINENT  PMH / PSH: Anxiety  OBJECTIVE:   BP 102/60   Pulse 83   Ht 5\' 8"  (1.727 m)   Wt 150 lb 4 oz (68.2 kg)   SpO2 99%   BMI 22.85 kg/m   General: Alert, oriented.  No acute distress CV: Regular rate and rhythm Pulmonary: Lungs clear auscultation bilaterally Psych: Appears slightly less anxious than previous encounter.  ASSESSMENT/PLAN:   Anxiety Patient will continue the Lexapro until next visit.  If at that time she would like to change to a different medication we will.  Weight loss Patient's regular weight is around 170 pounds.  Patient had gone down to 160 prior to starting the anxiety medication and is currently at 150 pounds.  Patient endorses decreased appetite but no difficulties with swallowing.  No vomiting.  Possibly due to her mood disorder and/or the medications used to treat it.  We will continue to monitor while we work to stabilize her mood     , MD [facility]

## 2020-11-17 ENCOUNTER — Ambulatory Visit (INDEPENDENT_AMBULATORY_CARE_PROVIDER_SITE_OTHER): Payer: BC Managed Care – PPO | Admitting: Family Medicine

## 2020-11-17 ENCOUNTER — Other Ambulatory Visit: Payer: Self-pay

## 2020-11-17 ENCOUNTER — Encounter: Payer: Self-pay | Admitting: Family Medicine

## 2020-11-17 ENCOUNTER — Ambulatory Visit: Payer: BC Managed Care – PPO | Admitting: Student in an Organized Health Care Education/Training Program

## 2020-11-17 VITALS — BP 102/60 | HR 83 | Ht 68.0 in | Wt 150.2 lb

## 2020-11-17 DIAGNOSIS — F419 Anxiety disorder, unspecified: Secondary | ICD-10-CM | POA: Diagnosis not present

## 2020-11-17 DIAGNOSIS — R634 Abnormal weight loss: Secondary | ICD-10-CM

## 2020-11-17 NOTE — Patient Instructions (Signed)
It was nice to see you again today,  We will stick with this medication for now and reassess in 2 weeks.  If in 2 weeks you want to switch medications or try something else we will make a change at that time.  Have a [place] day,  Frederic Jericho, MD

## 2020-11-17 NOTE — Progress Notes (Deleted)
   SUBJECTIVE:   CHIEF COMPLAINT / HPI: anxiety  ***  PERTINENT  PMH / PSH: ***  OBJECTIVE:   BP 102/60   Pulse 83   Ht 5\' 8"  (1.727 m)   Wt 150 lb 4 oz (68.2 kg)   SpO2 99%   BMI 22.85 kg/m   ***  ASSESSMENT/PLAN:   No problem-specific Assessment & Plan notes found for this encounter.     , DO [facility]

## 2020-11-18 DIAGNOSIS — R634 Abnormal weight loss: Secondary | ICD-10-CM | POA: Insufficient documentation

## 2020-11-18 NOTE — Assessment & Plan Note (Signed)
Patient's regular weight is around 170 pounds.  Patient had gone down to 160 prior to starting the anxiety medication and is currently at 150 pounds.  Patient endorses decreased appetite but no difficulties with swallowing.  No vomiting.  Possibly due to her mood disorder and/or the medications used to treat it.  We will continue to monitor while we work to stabilize her mood

## 2020-11-18 NOTE — Assessment & Plan Note (Signed)
Patient will continue the Lexapro until next visit.  If at that time she would like to change to a different medication we will.

## 2020-11-21 ENCOUNTER — Telehealth: Payer: Self-pay | Admitting: Family Medicine

## 2020-11-21 ENCOUNTER — Encounter: Payer: Self-pay | Admitting: Family Medicine

## 2020-11-21 NOTE — Telephone Encounter (Signed)
**  After Hours/ Emergency Line Call**  Received a page to call [phone] - [phone].  Patient: Katherine Lindsey  Caller: Self  Confirmed name & DOB of patient with caller  Subjective:  Patient calling because she missed a few doses of lexapro. Her last dose was two nights ago. She currently takes 5 mg nightly. She reports that she just kept forgetting to take the the medication because she fell asleep. She is wondering when she should restart the medication.   Objective:  Observations: NAD.    Assessment & Plan  Katherine Lindsey is a 34 y.o. female with PMHx s/f anxiety who calls with the following complaints and concerns:   Missed dose of lexapro.  Patient on low-dose Lexapro 5 mg.  She reports that she forgot to take it for 2 days in a row.  Patient started the medication about 3-1/2 weeks ago.  She has been taking the medication for anxiety and reports that her anxiety has been better controlled.  She is nervous that she may have some unwanted side effects of possibly withdrawing from the medication.  I recommended that she restart the medication today. Additionally, if she has trouble taking the medication at night and remembering, she should switch her dosing to the daytime to limit the amount of missed doses as it does seem to be helping her.  -- Will forward to PCP.  Melene Plan, M.D.  11/21/2020 6:58 AM

## 2020-11-28 ENCOUNTER — Other Ambulatory Visit: Payer: Self-pay

## 2020-11-28 ENCOUNTER — Ambulatory Visit (INDEPENDENT_AMBULATORY_CARE_PROVIDER_SITE_OTHER): Payer: BC Managed Care – PPO | Admitting: *Deleted

## 2020-11-28 ENCOUNTER — Encounter: Payer: Self-pay | Admitting: *Deleted

## 2020-11-28 VITALS — BP 117/78 | HR 72 | Ht 68.0 in | Wt 151.3 lb

## 2020-11-28 DIAGNOSIS — Z3042 Encounter for surveillance of injectable contraceptive: Secondary | ICD-10-CM | POA: Diagnosis not present

## 2020-11-28 MED ORDER — MEDROXYPROGESTERONE ACETATE 150 MG/ML IM SUSP
150.0000 mg | Freq: Once | INTRAMUSCULAR | Status: AC
  Administered 2020-11-28: 16:00:00 150 mg via INTRAMUSCULAR

## 2020-11-28 NOTE — Progress Notes (Signed)
Depo Provera 150 mg IM administered as scheduled. Pt tolerated well. Next dose due 2/28-3/14/22. Pt had abnormal Pap on 9/13 and missed [facility] appointment on 11/1. This will need to be re-scheduled. Pt stated that she has been on Depo Provera for 10 years and experiences anxiety close to the time of next injection due. She wants to know if this is related to her hormone (estrogen) levels. I advised pt that I do not know the answer and encouraged her to discuss with the doctor @ her Colpo appointment. She voiced understanding of all information and instructions given.

## 2020-11-30 ENCOUNTER — Encounter: Payer: Self-pay | Admitting: Family Medicine

## 2020-11-30 ENCOUNTER — Ambulatory Visit: Payer: BC Managed Care – PPO | Admitting: Student in an Organized Health Care Education/Training Program

## 2020-11-30 ENCOUNTER — Ambulatory Visit: Payer: BC Managed Care – PPO | Admitting: Family Medicine

## 2020-11-30 ENCOUNTER — Other Ambulatory Visit: Payer: Self-pay

## 2020-11-30 ENCOUNTER — Ambulatory Visit (INDEPENDENT_AMBULATORY_CARE_PROVIDER_SITE_OTHER): Payer: BC Managed Care – PPO | Admitting: Family Medicine

## 2020-11-30 DIAGNOSIS — F419 Anxiety disorder, unspecified: Secondary | ICD-10-CM

## 2020-11-30 NOTE — Progress Notes (Signed)
    SUBJECTIVE:   CHIEF COMPLAINT / HPI:   Anxiety: Patient feels better today.  She feels like her anxiety is under much better control.  She really only feels anxious in the morning.  If she feels like a panic attack is coming on she does deep breathing exercises.  She has been talking with a therapist and feels like this really helps.  They have been doing journaling and deep breathing exercises.  She has been eating more consistently 3 meals a day which she believes has helped her stomach issues.  She is not currently having any abdominal discomfort and is not taking any of the antacid medications.  She will still take episode as needed in the future for her GERD.  PERTINENT  PMH / PSH: Anxiety, GERD  OBJECTIVE:   BP 110/60   Pulse 69   Ht 5\' 8"  (1.727 m)   Wt 151 lb 8 oz (68.7 kg)   SpO2 99%   BMI 23.04 kg/m   General: Alert and oriented.  No acute distress.  Healthy-appearing female, appears stated age Psych: Patient appears much less anxious today.  Smiling more than previously and making good eye contact.   ASSESSMENT/PLAN:   Anxiety Combination of 5 mg Lexapro and therapy have greatly decreased her anxiety since last visit.  She has not tolerating the GI side effects much better and currently does not endorse any symptoms of GI discomfort.  We discussed the pros and cons of going up on her dose as she is currently on 5 mg (half of a 10 mg tablet nightly).  Patient will continue current dose and follow-up in 4 weeks with me.     , MD [facility]

## 2020-11-30 NOTE — Patient Instructions (Signed)
It was nice to see you again today,  It appears you are doing much better on this medication.  I am glad to see that you are less anxious today and that your stomach issues have improved.  For now we will continue on the same dose of 5 mg and follow-up in 1 month.  If you still feel like you could improve by increasing your dose on her next visit, we can increase the dose at that time.  Have a great day,  Frederic Jericho, MD

## 2020-11-30 NOTE — Assessment & Plan Note (Signed)
Combination of 5 mg Lexapro and therapy have greatly decreased her anxiety since last visit.  She has not tolerating the GI side effects much better and currently does not endorse any symptoms of GI discomfort.  We discussed the pros and cons of going up on her dose as she is currently on 5 mg (half of a 10 mg tablet nightly).  Patient will continue current dose and follow-up in 4 weeks with me.

## 2020-12-05 DIAGNOSIS — Z20822 Contact with and (suspected) exposure to covid-19: Secondary | ICD-10-CM | POA: Diagnosis not present

## 2020-12-05 DIAGNOSIS — Z03818 Encounter for observation for suspected exposure to other biological agents ruled out: Secondary | ICD-10-CM | POA: Diagnosis not present

## 2020-12-06 ENCOUNTER — Ambulatory Visit: Payer: BC Managed Care – PPO

## 2020-12-20 DIAGNOSIS — R079 Chest pain, unspecified: Secondary | ICD-10-CM | POA: Diagnosis not present

## 2020-12-20 DIAGNOSIS — R0689 Other abnormalities of breathing: Secondary | ICD-10-CM | POA: Diagnosis not present

## 2020-12-20 DIAGNOSIS — R42 Dizziness and giddiness: Secondary | ICD-10-CM | POA: Diagnosis not present

## 2020-12-20 DIAGNOSIS — R0789 Other chest pain: Secondary | ICD-10-CM | POA: Diagnosis not present

## 2020-12-22 ENCOUNTER — Telehealth: Payer: Self-pay | Admitting: Family Medicine

## 2020-12-22 NOTE — Telephone Encounter (Signed)
**  After Hours/ Emergency Line Call**  Received a call to report that Katherine Lindsey started experiencing left leg pain this morning. She is worried it is a blood clot and it is "freaking her out".  Endorsing pain from bottom of foot up through her leg. Called the ambulance yesterday due to feeling funny but stated everything with her vitals and blood sugar checked out. Unsure whether she has taken her lexapro since yesterday. Says she initially had GI side effects from it but now feels as if she is moving slower and cannot feel her heartbeat. She talks with a psychologist every 2-3 weeks last session being prior to  Christmas. Denying leg warmth, swelling, or increased pain with plantarflexion. Patient does not have a hx of VTE, is not a smoker, is on depo.  Recommended that she goes to ED if denied symptoms occur along with chest pain and shortness of breath. Low suspicion for DVT at this time.  Red flags discussed. Patient scheduled on ATC 12/24/19 per request to discuss leg pain if it continues and possible medication change from lexapro 5 mg daily. Will forward to PCP and ATC provider on the scheduled appointment date.   Lavonda Jumbo, DO PGY-2, [facility] 12/22/2020 3:07 AM

## 2020-12-23 ENCOUNTER — Ambulatory Visit: Payer: BC Managed Care – PPO

## 2020-12-23 ENCOUNTER — Ambulatory Visit: Payer: BC Managed Care – PPO | Admitting: Family Medicine

## 2020-12-23 ENCOUNTER — Other Ambulatory Visit: Payer: Self-pay

## 2020-12-23 VITALS — BP 110/70 | HR 76 | Ht 68.0 in | Wt 150.1 lb

## 2020-12-23 DIAGNOSIS — F419 Anxiety disorder, unspecified: Secondary | ICD-10-CM

## 2020-12-23 DIAGNOSIS — M79604 Pain in right leg: Secondary | ICD-10-CM | POA: Diagnosis not present

## 2020-12-23 DIAGNOSIS — M79605 Pain in left leg: Secondary | ICD-10-CM | POA: Diagnosis not present

## 2020-12-23 MED ORDER — ESCITALOPRAM OXALATE 10 MG PO TABS: 10.0000 mg | ORAL_TABLET | Freq: Every day | ORAL | 0 refills | Status: DC

## 2020-12-23 NOTE — Progress Notes (Signed)
    SUBJECTIVE:   CHIEF COMPLAINT / HPI:   Anxiety: wants to 'bump up' her celexa to 10mg .  She feels like her anxiety has been worsening and thinks work may be increasing the anxiety.  She had a 'full panic attack' on her first day back to work. She recently forgot to take her medication two days in a row but has otherwise been compliant  Leg pain: has a 'weird' sensation in her left leg, but progressed to the right leg.  Describes it as a 'sharp, rolling pain'.  Not a pins and needlses sensation.  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  No injuries or overexertion recently. No prolonged periods of inactivity.   PERTINENT  PMH / PSH: anxiety  OBJECTIVE:   BP 110/70   Pulse 76   Ht 5\' 8"  (1.727 m)   Wt 150 lb 2 oz (68.1 kg)   SpO2 99%   BMI 22.83 kg/m   General: Alert and oriented.  No acute distress CV: Regular rate and rhythm MSK: 5/5 strength in lower extremities bilaterally, no swelling or asymmetry between the 2 legs.  Sensation intact.  Nontender to palpation. Psych: Generally less anxious appearing than previous exams.  ASSESSMENT/PLAN:   Anxiety Feels like her anxiety/panic attacks have worsened since going back to work.  Will increase lexapro to 10mg  and reassess in 3-4 weeks.  Advised pt she can still use hydroxyzine occasionally during episodes of panic attacks while taking her lexapro   Pain in both lower extremities  Recent onset of 'Weird sensation' in the bilateral thighs. Normal physical exam. No concern for DVT.  Will check electrolytes and b12 but pt not really describing it as a paresthesia.  Does not appear to be paresthetica meralgia. Likely a manifestation of her anxiety.  Will continue to monitor.      , MD [facility]

## 2020-12-23 NOTE — Patient Instructions (Signed)
It was nice to see you again today,  We can increase your dose of Lexapro to 10 mg.  I have put in a new prescription for you  Your physical exam for your leg pain is reassuring.  Do not believe you have a blood clot.  I will get some basic lab work and follow-up with results when I get them.  I would like to see you back again in 3 to 4 weeks.  Have a great day,  Frederic Jericho, MD

## 2020-12-23 NOTE — Assessment & Plan Note (Addendum)
Feels like her anxiety/panic attacks have worsened since going back to work.  Will increase lexapro to 10mg  and reassess in 3-4 weeks.  Advised pt she can still use hydroxyzine occasionally during episodes of panic attacks while taking her lexapro

## 2020-12-23 NOTE — Assessment & Plan Note (Signed)
Recent onset of 'Weird sensation' in the bilateral thighs. Normal physical exam. No concern for DVT.  Will check electrolytes and b12 but pt not really describing it as a paresthesia.  Does not appear to be paresthetica meralgia. Likely a manifestation of her anxiety.  Will continue to monitor.

## 2020-12-24 ENCOUNTER — Telehealth: Payer: Self-pay | Admitting: Family Medicine

## 2020-12-24 LAB — VITAMIN B12: Vitamin B-12: 495 pg/mL (ref 232–1245)

## 2020-12-24 LAB — BASIC METABOLIC PANEL
BUN/Creatinine Ratio: 14 (ref 9–23)
BUN: 10 mg/dL (ref 6–20)
CO2: 18 mmol/L — ABNORMAL LOW (ref 20–29)
Calcium: 9.6 mg/dL (ref 8.7–10.2)
Chloride: 106 mmol/L (ref 96–106)
Creatinine, Ser: 0.73 mg/dL (ref 0.57–1.00)
GFR calc Af Amer: 124 mL/min/{1.73_m2} (ref >59)
GFR calc non Af Amer: 108 mL/min/{1.73_m2} (ref >59)
Glucose: 89 mg/dL (ref 65–99)
Potassium: 4.2 mmol/L (ref 3.5–5.2)
Sodium: 141 mmol/L (ref 134–144)

## 2020-12-24 NOTE — Telephone Encounter (Signed)
**  After Hours/ Emergency Line Call**  Received a call to report that Katherine Lindsey had a question about her lab results.  She states that she has a history of anxiety but does not have any other medical problems but noted on her last lab results that she had a CO2 level that was reduced at 18 with a reference range of 20-29.  She states that this gave her some anxiety and so she wanted to call in to make sure this was okay as she had read on the Internet that it can be a sign of kidney disease.  I discussed with the patient that she has had levels consistent with this in the past and that with having no further medical problems and with a creatinine within normal limits I am not concerned about this level at this time.  Patient seemed reassured by this and stated that she had no further questions.  No other concerns at this time.  Will forward to PCP.  Jackelyn Poling, DO PGY-2, [facility] 12/24/2020 9:39 AM

## 2020-12-25 DIAGNOSIS — Z1152 Encounter for screening for COVID-19: Secondary | ICD-10-CM | POA: Diagnosis not present

## 2020-12-29 ENCOUNTER — Telehealth: Payer: Self-pay | Admitting: Family Medicine

## 2020-12-29 NOTE — Telephone Encounter (Signed)
**  After Hours/ Emergency Line Call**  Received a call to report that TERRIS BODIN is feeling fatigue, and experiencing chest heaviness. Endorsing symptoms of cough and congestion since 12/24/20. COVID tested 1/9 and notified she was positive 1/11. She is not vaccinated. She states she is feeling of loopy, and taking lexapro for her anxiety makes her feel way too relax. Would like to know if she should stop taking her lexapro. She says she was assessed by EMS today after calling them due to the symptoms above and O2 sat was 100%.   Extensive discussion about when to present to ED/urgent care. Let her know that her oxygen status and her ability to hold a conversation without becoming winded is reassuring at this time. Recommended that could stop taking the lexapro for few days until she feels better if she would like; on the other hand I did voice that this medication should not interfere with her course of COVID as she is worried about. She takes only 5 mg of lexapro. We also discussed MAB infusion as she asked about medication for COVID, I expressed that she was not a candidate for as she does not meet the criteria. Patient to call office when well to receive COVID vaccine. Patient voiced understanding and appreciation. Will forward to PCP.  Lavonda Jumbo, DO PGY-2, [facility] 12/29/2020 11:17 PM

## 2021-01-02 ENCOUNTER — Telehealth: Payer: Self-pay | Admitting: Family Medicine

## 2021-01-02 DIAGNOSIS — F419 Anxiety disorder, unspecified: Secondary | ICD-10-CM

## 2021-01-02 NOTE — Telephone Encounter (Signed)
**   After-hours emergency line call **  Patient contacted at number provided [phone].  Lexapro: Patient reports she is sometimes forgetting to take her Lexapro 10 mg.  Sometimes she will forget to take it for couple days at a time, then take it for couple of days, then forget again.  She has been waking up in the morning and sometimes feeling kind of sore or achy.  She is wondering if this is withdrawal symptoms from Lexapro  Soreness in left rib/armpit: Patient reports chest pain/rib pain that has been going on "for a while" that is located near her left armpit.  The patient was diagnosed with COVID on January 11 after she was swabbed January 9.  Patient currently denies shortness of breath, only says she has shortness of breath "when she feels like she is having a panic attack".  I encouraged the patient to purchase a pillbox to help keep medicines organized that she takes her Lexapro every day.  This soreness she describes having in the morning is most likely due to having been diagnosed with COVID and not due to Lexapro.  Also do not feel that the soreness in her left rib/armpit is of a cardiac etiology or due to the Lexapro.  I encouraged her to call the clinic to schedule an appointment for later this week/early next week for her to be seen for these issues.   Peggyann Shoals, DO [facility], PGY-3 01/02/2021 5:07 AM

## 2021-01-05 ENCOUNTER — Emergency Department (HOSPITAL_COMMUNITY): Payer: BC Managed Care – PPO

## 2021-01-05 ENCOUNTER — Other Ambulatory Visit: Payer: Self-pay

## 2021-01-05 ENCOUNTER — Emergency Department (HOSPITAL_COMMUNITY)
Admission: EM | Admit: 2021-01-05 | Discharge: 2021-01-06 | Disposition: A | Discharge status: Home / Self Care | Payer: BC Managed Care – PPO | Attending: Emergency Medicine | Admitting: Emergency Medicine

## 2021-01-05 ENCOUNTER — Encounter (HOSPITAL_COMMUNITY): Payer: Self-pay

## 2021-01-05 DIAGNOSIS — Z8616 Personal history of COVID-19: Secondary | ICD-10-CM | POA: Diagnosis not present

## 2021-01-05 DIAGNOSIS — M79605 Pain in left leg: Secondary | ICD-10-CM | POA: Insufficient documentation

## 2021-01-05 DIAGNOSIS — M79601 Pain in right arm: Secondary | ICD-10-CM | POA: Insufficient documentation

## 2021-01-05 DIAGNOSIS — R0602 Shortness of breath: Secondary | ICD-10-CM | POA: Diagnosis not present

## 2021-01-05 DIAGNOSIS — R079 Chest pain, unspecified: Secondary | ICD-10-CM | POA: Insufficient documentation

## 2021-01-05 DIAGNOSIS — R0981 Nasal congestion: Secondary | ICD-10-CM | POA: Insufficient documentation

## 2021-01-05 DIAGNOSIS — R0789 Other chest pain: Secondary | ICD-10-CM | POA: Diagnosis not present

## 2021-01-05 DIAGNOSIS — M79604 Pain in right leg: Secondary | ICD-10-CM | POA: Diagnosis not present

## 2021-01-05 DIAGNOSIS — M79602 Pain in left arm: Secondary | ICD-10-CM | POA: Insufficient documentation

## 2021-01-05 LAB — BASIC METABOLIC PANEL
Anion gap: 8 (ref 5–15)
BUN: 6 mg/dL (ref 6–20)
CO2: 22 mmol/L (ref 22–32)
Calcium: 9.7 mg/dL (ref 8.9–10.3)
Chloride: 107 mmol/L (ref 98–111)
Creatinine, Ser: 0.61 mg/dL (ref 0.44–1.00)
GFR, Estimated: >60 mL/min (ref >60)
Glucose, Bld: 93 mg/dL (ref 70–99)
Potassium: 3.9 mmol/L (ref 3.5–5.1)
Sodium: 137 mmol/L (ref 135–145)

## 2021-01-05 LAB — CBC
HCT: 37.2 % (ref 36.0–46.0)
Hemoglobin: 12.6 g/dL (ref 12.0–15.0)
MCH: 28.7 pg (ref 26.0–34.0)
MCHC: 33.9 g/dL (ref 30.0–36.0)
MCV: 84.7 fL (ref 80.0–100.0)
Platelets: 260 10*3/uL (ref 150–400)
RBC: 4.39 MIL/uL (ref 3.87–5.11)
RDW: 12.6 % (ref 11.5–15.5)
WBC: 5.7 10*3/uL (ref 4.0–10.5)
nRBC: 0 % (ref 0.0–0.2)

## 2021-01-05 LAB — I-STAT BETA HCG BLOOD, ED (MC, WL, AP ONLY): I-stat hCG, quantitative: <5 m[IU]/mL (ref <5)

## 2021-01-05 LAB — TROPONIN I (HIGH SENSITIVITY)
Troponin I (High Sensitivity): <2 ng/L (ref <18)
Troponin I (High Sensitivity): <2 ng/L (ref <18)

## 2021-01-05 MED ORDER — ACETAMINOPHEN 325 MG PO TABS
650.0000 mg | ORAL_TABLET | Freq: Once | ORAL | Status: AC
  Administered 2021-01-05: 650 mg via ORAL
  Filled 2021-01-05: qty 2

## 2021-01-05 MED ORDER — ALUM & MAG HYDROXIDE-SIMETH 200-200-20 MG/5ML PO SUSP
30.0000 mL | Freq: Once | ORAL | Status: AC
  Administered 2021-01-05: 30 mL via ORAL
  Filled 2021-01-05: qty 30

## 2021-01-05 MED ORDER — LIDOCAINE VISCOUS HCL 2 % MT SOLN
15.0000 mL | Freq: Once | OROMUCOSAL | Status: AC
  Administered 2021-01-05: 15 mL via ORAL
  Filled 2021-01-05: qty 15

## 2021-01-05 NOTE — ED Provider Notes (Signed)
[facility] EMERGENCY DEPARTMENT Provider Note   CSN: [id number] Arrival date & time: 01/05/21  1214     History Chief Complaint  Patient presents with  . Chest Pain    Katherine Lindsey is a 35 y.o. female with a hx of crohns disease, GERD, anxiety, & prior cholecystectomy who presents to the ED with complaints of chest discomfort since yesterday and leg pain intermittently for the past few weeks. Patient states there is a pressure to her central chest that is intermittent, feels like she needs to clear her throat/lungs and cough some/take a deep breath. No significant alleviating/aggravating factors other than some relief when she clears her throat. Having some mild nasal congestion. She also is complaining of pain to her bilateral lower extremities intermittently for the past few weeks. Feels like nerve pain shooting down her legs intermittently. Has discussed w/ PCP previously. Felt somewhat worse today and had some pain her arms which she has had before too. Denies fever, chills, N/V/D, abdominal pain, leg swelling, hemoptysis, recent surgery/trauma, recent long travel, personal hx of cancer, or hx of DVT/PE. Receives depo shots for birth control. Had COVID 19 in early January and feels she recovered from this. States she has a hx of anxiety but this feels different, but this did trigger her anxiety some.    HPI     Past Medical History:  Diagnosis Date  . Anxiety   . Crohn's disease in remission [facility])    Hasn't had symptoms since 2004  . Gallstones   . Hammertoe 09/21/2014  . History of low transverse cesarean section 11/28/2015  . VBAC, delivered 03/19/2016    Patient Active Problem List   Diagnosis Date Noted  . Pain in both lower extremities 12/23/2020  . Weight loss 11/18/2020  . Gastroesophageal reflux disease 08/29/2020  . Nonintractable episodic headache 01/06/2020  . Anxiety 07/15/2019  . Hidradenitis suppurativa 07/15/2019  . High risk HPV  infection 02/07/2017  . Vaginal spotting 10/17/2012    Past Surgical History:  Procedure Laterality Date  . CESAREAN SECTION N/A 06/16/2013   Procedure: CESAREAN SECTION;  Surgeon: Reva Bores, MD;  Location: [facility] ORS;  Service: Obstetrics;  Laterality: N/A;  . CHOLECYSTECTOMY  08/28/2012   Procedure: LAPAROSCOPIC CHOLECYSTECTOMY WITH INTRAOPERATIVE CHOLANGIOGRAM;  Surgeon: [name], DO;  Location: [facility] OR;  Service: General;  Laterality: N/A;  . INDUCED ABORTION  02/2012     OB History    Gravida  4   Para  3   Term  3   Preterm      AB  1   Living  3     SAB  0   IAB  1   Ectopic      Multiple  0   Live Births  3        Obstetric Comments  2014 LTCS with extension of hysterotomy inferiorly        Family History  Problem Relation Age of Onset  . Other Neg Hx   . Diabetes Neg Hx   . Heart disease Neg Hx   . Hyperlipidemia Neg Hx   . Hypertension Neg Hx   . Stroke Neg Hx     Social History   Tobacco Use  . Smoking status: Never Smoker  . Smokeless tobacco: Never Used  Substance Use Topics  . Alcohol use: Yes    Alcohol/week: 1.0 standard drink    Types: 1 Glasses of wine per week  . Drug use: No  Home Medications Prior to Admission medications   Medication Sig Start Date End Date Taking? Authorizing Provider  escitalopram (LEXAPRO) 10 MG tablet Take 1 tablet (10 mg total) by mouth daily. 12/23/20   Sandre Kitty, MD  famotidine (PEPCID) 20 MG tablet Take 1 tablet (20 mg total) by mouth daily. Patient not taking: Reported on 11/28/2020 10/30/20   Sandre Kitty, MD  hydrOXYzine (ATARAX/VISTARIL) 25 MG tablet Take 2 tablets (50 mg total) by mouth every 6 (six) hours as needed. Patient not taking: Reported on 11/28/2020 08/22/20   Shanon Ace, PA-C  medroxyPROGESTERone (DEPO-PROVERA) 150 MG/ML injection Inject 150 mg into the muscle every 3 (three) months.    [provider]  omeprazole (PRILOSEC) 20 MG capsule Take 1 capsule  (20 mg total) by mouth 2 (two) times daily before a meal. Patient not taking: Reported on 11/28/2020 10/28/20   Sandre Kitty, MD  ondansetron (ZOFRAN ODT) 4 MG disintegrating tablet Take 1 tablet (4 mg total) by mouth every 8 (eight) hours as needed for nausea or vomiting. Patient not taking: Reported on 11/28/2020 10/28/20   Farrel Gordon, PA-C  tiZANidine (ZANAFLEX) 4 MG tablet Take 1 tablet (4 mg total) by mouth every 8 (eight) hours as needed. Patient not taking: Reported on 11/28/2020 10/18/20   Wallis Bamberg, PA-C  promethazine (PHENERGAN) 25 MG tablet Take 1 tablet (25 mg total) by mouth every 8 (eight) hours as needed (headache). Patient not taking: Reported on 07/14/2020 03/28/20 08/16/20  Gilda Crease, MD  SUMAtriptan (IMITREX) 50 MG tablet Take 1 tablet (50 mg total) by mouth every 2 (two) hours as needed for migraine. May repeat in 2 hours if headache persists or recurs. Patient not taking: Reported on 07/14/2020 01/27/20 08/16/20  Gailen Shelter, PA    Allergies    Patient has no known allergies.  Review of Systems   Review of Systems  Constitutional: Negative for chills and fever.  HENT: Positive for congestion. Negative for ear pain.   Eyes: Negative for visual disturbance.  Cardiovascular: Positive for chest pain. Negative for leg swelling.  Gastrointestinal: Negative for abdominal pain, anal bleeding, blood in stool, diarrhea, nausea and vomiting.  Genitourinary: Negative for dysuria.  Musculoskeletal: Positive for myalgias.  Neurological: Negative for syncope, weakness, numbness and headaches.       Negative for incontinence.   Psychiatric/Behavioral: The patient is nervous/anxious.   All other systems reviewed and are negative.  Physical Exam Updated Vital Signs BP 125/80   Pulse 68   Temp 98.5 F (36.9 C) (Oral)   Resp 16   Ht 5\' 8"  (1.727 m)   Wt 68 kg   SpO2 100%   BMI 22.81 kg/m   Physical Exam Vitals and nursing note reviewed.  Constitutional:       General: She is not in acute distress.    Appearance: She is well-developed. She is not toxic-appearing.  HENT:     Head: Normocephalic and atraumatic.  Eyes:     General:        Right eye: No discharge.        Left eye: No discharge.     Conjunctiva/sclera: Conjunctivae normal.  Cardiovascular:     Rate and Rhythm: Normal rate and regular rhythm.     Pulses:          Radial pulses are 2+ on the right side and 2+ on the left side.       Posterior tibial pulses are 2+ on the  right side and 2+ on the left side.  Pulmonary:     Effort: Pulmonary effort is normal. No respiratory distress.     Breath sounds: Normal breath sounds. No wheezing, rhonchi or rales.  Abdominal:     General: There is no distension.     Palpations: Abdomen is soft.     Tenderness: There is no abdominal tenderness.  Musculoskeletal:     Cervical back: Neck supple.     Right lower leg: No edema.     Left lower leg: No edema.     Comments: No obvious deformities, significant ecchymosis, or significant wounds.  UEs/LEs: Patient has intact active range of motion of all major joints in the upper and lower extremities. She has no focal bony areas of tenderness of significant focal muscle tenderness. Compartments are soft. NVI distally.  Back: No midline tenderness or palpable step off.   Skin:    General: Skin is warm and dry.     Findings: No rash.  Neurological:     Mental Status: She is alert.     Comments: Clear speech. Sensation grossly intact to bilateral upper/lower extremities. 5/5 symmetric grip strength & strength with plantar/dorsiflexion bilaterally.   Psychiatric:        Behavior: Behavior normal.    ED Results / Procedures / Treatments   Labs (all labs ordered are listed, but only abnormal results are displayed) Labs Reviewed  D-DIMER, QUANTITATIVE (NOT AT [facility]) - Abnormal; Notable for the following components:      Result Value   D-Dimer, Quant 0.84 (*)    All other components within  normal limits  BASIC METABOLIC PANEL  CBC  I-STAT BETA HCG BLOOD, ED ([facility], [facility], [facility] ONLY)  TROPONIN I (HIGH SENSITIVITY)  TROPONIN I (HIGH SENSITIVITY)    EKG EKG Interpretation  Date/Time:  Thursday January 05 2021 12:44:46 EST Ventricular Rate:  67 PR Interval:    QRS Duration: 82 QT Interval:  362 QTC Calculation: 382 R Axis:   86 Text Interpretation: Sinus rhythm Nonspecific T wave abnormality No significant change since last tracing Confirmed by Gwyneth Sprout ([id number]) on 01/05/2021 10:50:27 PM   Radiology DG Chest 2 View  Result Date: 01/05/2021 CLINICAL DATA:  chest pain EXAM: CHEST - 2 VIEW COMPARISON:  10/28/2020 and prior. FINDINGS: No focal consolidation. No pneumothorax or pleural effusion. Mild peribronchial cuffing. Cardiomediastinal silhouette is within normal limits. No acute osseous abnormality. IMPRESSION: Mild bronchitic changes without focal consolidation. Electronically Signed   By: Stana Bunting M.D.   On: 01/05/2021 13:05   CT Angio Chest PE W/Cm &/Or Wo Cm  Result Date: 01/06/2021 CLINICAL DATA:  Chest pain and shortness of breath. EXAM: CT ANGIOGRAPHY CHEST WITH CONTRAST TECHNIQUE: Multidetector CT imaging of the chest was performed using the standard protocol during bolus administration of intravenous contrast. Multiplanar CT image reconstructions and MIPs were obtained to evaluate the vascular anatomy. CONTRAST:  53mL OMNIPAQUE IOHEXOL 350 MG/ML SOLN COMPARISON:  None. FINDINGS: Cardiovascular: Contrast injection is sufficient to demonstrate satisfactory opacification of the pulmonary arteries to the segmental level. There is no pulmonary embolus or evidence of right heart strain. The size of the main pulmonary artery is normal. Heart size is normal, with no pericardial effusion. The course and caliber of the aorta are normal. There is no atherosclerotic calcification. Opacification decreased due to pulmonary arterial phase contrast bolus timing.  Mediastinum/Nodes: -- No mediastinal lymphadenopathy. -- No hilar lymphadenopathy. --there is isolated right axillary adenopathy. The dominant lymph node measures approximately  1.5 cm (axial series 5, image 33). There is no significant left axillary adenopathy. -- No supraclavicular lymphadenopathy. -- Normal thyroid gland where visualized. -  Unremarkable esophagus. Lungs/Pleura: Airways are patent. No pleural effusion, lobar consolidation, pneumothorax or pulmonary infarction. Upper Abdomen: Contrast bolus timing is not optimized for evaluation of the abdominal organs. The visualized portions of the organs of the upper abdomen are normal. Musculoskeletal: No chest wall abnormality. No bony spinal canal stenosis. Review of the MIP images confirms the above findings. IMPRESSION: 1. No evidence of pulmonary embolus or other acute intrathoracic process. 2. Isolated right axillary adenopathy of unknown clinical significance. While this may be reactive in etiology, follow-up as an outpatient with a breast care center and is recommended. Electronically Signed   By: Katherine Mantle M.D.   On: 01/06/2021 01:28    Procedures Procedures (including critical care time)  Medications Ordered in ED Medications - No data to display  ED Course  I have reviewed the triage vital signs and the nursing notes.  Pertinent labs & imaging results that were available during my care of the patient were reviewed by me and considered in my medical decision making (see chart for details).    MDM Rules/Calculators/A&P                          Patient presents to the emergency department with chest pain. Patient nontoxic appearing, in no apparent distress, vitals without significant abnormality.    DDX: ACS, pulmonary embolism, dissection, pneumothorax, pneumonia, arrhythmia, severe anemia, MSK, GERD, anxiety.   Additional history obtained:  Additional history obtained from chart review & nursing note review.   EKG: No  significant change compared to prior  Lab Tests:  I Ordered, reviewed, and interpreted labs, which included:  CBC, BMP, pregnancy test, troponin, D-dimer: Fairly unremarkable with exception of elevated D-dimer.  Imaging Studies ordered:  I ordered imaging studies which included CXR & subsequently CTA, I independently visualized and interpreted imaging which showed:  1. No evidence of pulmonary embolus or other acute intrathoracic process. 2. Isolated right axillary adenopathy of unknown clinical significance. While this may be reactive in etiology, follow-up as an outpatient with a breast care center and is recommended--> no overlying erythema/warmth to this area.   Low risk wells, d-dimer positive- CTA negative for PE.  Low risk HEAR score- 2, no significant change on EKG, troponins flat.  Symmetric pulses, no wide mediastinum, doubt dissection. Chest x-ray/CTA without infiltrate, pneumothorax, or pulmonary edema. Laboratory testing overall reassuring. In terms of her extremities, no recent trauma or focal bony tenderness to raise concern for fracture/dislocation, no edema to raise concern for DVT, all extremities are well-perfused have low suspicion for arterial clot, no signs of cellulitis.   Adenopathy will need outpatient follow up. Overall reassuring ED work-up. Will start on robaxin to try to help with possible muscular pain. PCP follow up. I discussed results, treatment plan, need for follow-up, and return precautions with the patient. Provided opportunity for questions, patient confirmed understanding and is in agreement with plan.   Portions of this note were generated with Scientist, clinical (histocompatibility and immunogenetics). Dictation errors may occur despite best attempts at proofreading.  Final Clinical Impression(s) / ED Diagnoses Final diagnoses:  Chest pain, unspecified type    Rx / DC Orders ED Discharge Orders         Ordered    methocarbamol (ROBAXIN) 500 MG tablet  Every 8 hours PRN  01/06/21 0228           Rohnan Bartleson, Pleas KochSamantha R, PA-C 01/06/21 [id number]    Gwyneth SproutPlunkett, Whitney, MD 01/06/21 [phone]

## 2021-01-05 NOTE — ED Triage Notes (Signed)
Pt is here today due to cp& sob x2 days.Pt reports pain is starting to radiate down to her legs bilaterally.

## 2021-01-06 ENCOUNTER — Other Ambulatory Visit: Payer: Self-pay

## 2021-01-06 ENCOUNTER — Ambulatory Visit (INDEPENDENT_AMBULATORY_CARE_PROVIDER_SITE_OTHER): Payer: BC Managed Care – PPO | Admitting: Family Medicine

## 2021-01-06 ENCOUNTER — Encounter: Payer: Self-pay | Admitting: Family Medicine

## 2021-01-06 ENCOUNTER — Emergency Department (HOSPITAL_COMMUNITY): Payer: BC Managed Care – PPO

## 2021-01-06 VITALS — BP 108/68 | HR 67 | Ht 68.0 in | Wt 146.2 lb

## 2021-01-06 DIAGNOSIS — R079 Chest pain, unspecified: Secondary | ICD-10-CM | POA: Diagnosis not present

## 2021-01-06 DIAGNOSIS — F419 Anxiety disorder, unspecified: Secondary | ICD-10-CM | POA: Diagnosis not present

## 2021-01-06 DIAGNOSIS — R0602 Shortness of breath: Secondary | ICD-10-CM | POA: Diagnosis not present

## 2021-01-06 DIAGNOSIS — R634 Abnormal weight loss: Secondary | ICD-10-CM

## 2021-01-06 LAB — D-DIMER, QUANTITATIVE: D-Dimer, Quant: 0.84 ug/mL-FEU — ABNORMAL HIGH (ref 0.00–0.50)

## 2021-01-06 MED ORDER — IOHEXOL 350 MG/ML SOLN
53.0000 mL | Freq: Once | INTRAVENOUS | Status: AC | PRN
  Administered 2021-01-06: 53 mL via INTRAVENOUS

## 2021-01-06 MED ORDER — METHOCARBAMOL 500 MG PO TABS: 500.0000 mg | ORAL_TABLET | Freq: Three times a day (TID) | ORAL | 0 refills | Status: DC | PRN

## 2021-01-06 NOTE — Patient Instructions (Signed)
It was nice to see you again today,  We will get some blood test today and then I will follow-up with you in 2 weeks.    I would like you to decrease your Celexa back down to 5 mg.  Have a great day,  Frederic Jericho, MD

## 2021-01-06 NOTE — Progress Notes (Signed)
    SUBJECTIVE:   CHIEF COMPLAINT / HPI:   Patient went to the emergency department last night because she was not feeling well that day. Started at work in which she was feeling "weak and sluggish". States that felt like she was going to fall down while at work. She has not fallen. She describes pain in her legs bilaterally that is more pain and then weakness. She says she can feel her blood "circulating" in her legs. Hot showers and heating pads help her symptoms. Patient has noticed the symptoms since increasing her dose of Celexa from 5 to 10 mg.  Patient states that since being on her increased dose of Celexa she has not noticed a improvement in her anxiety but has noticed that she wakes up at night and has trouble going back to sleep. Also notes this muscle aching in her legs. Patient feels like she did not have any of these physical symptoms until this past September when she started having a panic attack like episodes.   PERTINENT  PMH / PSH: Anxiety  OBJECTIVE:   BP 108/68   Pulse 67   Ht 5\' 8"  (1.727 m)   Wt 146 lb 3.2 oz (66.3 kg)   SpO2 98%   BMI 22.23 kg/m   General: Alert and oriented. No acute distress CV: Regular rate and rhythm Pulmonary: Lungs clear auscultation bilaterally MSK: No swelling or bruising the lower extremities. Nontender to palpation in the thighs or calves.  ASSESSMENT/PLAN:   Anxiety Patient's complaints of leg pain likely secondary to her anxiety or an increase in the medication used to treat her anxiety, especially given her normal physical exam and lab work. Patient has had other episodes of complaints in other parts of her body in the recent past including chest pain and abdominal pain that subsequently get worked up without a cause found. Symptoms were improving with therapy and the 5 mg of Celexa, but patient became more anxious with the start of going back to work and an increase in Celexa dose to 10 mg does not appear to have helped but has led  to other symptoms such as sleeplessness. - Decrease Celexa to 5 mg - TSH and CMP - Consider adjunctive agent at next visit.  Weight loss Patient's weight decreased again since her last visit. Has been near this current weight before but it was back in 2014. Her symptoms of upset stomach after starting the Celexa resolved after a few weeks. Diarrhea has never been a complaint of hers. Patient not currently complaining of stomach issues or appetite change but still losing weight. We'll get TSH and CMP today. If patient's weight does not stabilize will need to asked patient to keep food diary at next visit and consider referral for GI.     2015, MD [facility]

## 2021-01-06 NOTE — Discharge Instructions (Addendum)
You were seen in the emergency department today for chest pain. Your work-up in the emergency department has been overall reassuring. Your labs have been fairly normal and or similar to previous blood work you have had done. Your EKG and the enzyme we use to check your heart did not show an acute heart attack at this time. Your CT scan did not show a blood clot- your right axillary (arm pit) lymph nodes are somewhat swollen this can occur for a variety of reasons- please follow up with your primary care provider and the breast center for re-evaluation of this in 1-2 weeks.   We are sending you home with robaxin to help with pain.  - Robaxin is the muscle relaxer I have prescribed, this is meant to help with muscle tightness. Be aware that this medication may make you drowsy therefore the first time you take this it should be at a time you are in an environment where you can rest. Do not drive or operate heavy machinery when taking this medication. Do not drink alcohol or take other sedating medications with this medicine such as narcotics or benzodiazepines.   You make take Tylenol per over the counter dosing with these medications.   We have prescribed you new medication(s) today. Discuss the medications prescribed today with your pharmacist as they can have adverse effects and interactions with your other medicines including over the counter and prescribed medications. Seek medical evaluation if you start to experience new or abnormal symptoms after taking one of these medicines, seek care immediately if you start to experience difficulty breathing, feeling of your throat closing, facial swelling, or rash as these could be indications of a more serious allergic reaction  We would like you to follow up closely with your primary care provider within 1-3 days. Return to the ER immediately should you experience any new or worsening symptoms including but not limited to return of pain, worsened pain, vomiting,  shortness of breath, dizziness, lightheadedness, passing out, or any other concerns that you may have.

## 2021-01-07 LAB — COMPREHENSIVE METABOLIC PANEL
ALT: 29 IU/L (ref 0–32)
AST: 19 IU/L (ref 0–40)
Albumin/Globulin Ratio: 1.6 (ref 1.2–2.2)
Albumin: 5 g/dL — ABNORMAL HIGH (ref 3.8–4.8)
Alkaline Phosphatase: 83 IU/L (ref 44–121)
BUN/Creatinine Ratio: 8 — ABNORMAL LOW (ref 9–23)
BUN: 6 mg/dL (ref 6–20)
Bilirubin Total: 0.4 mg/dL (ref 0.0–1.2)
CO2: 20 mmol/L (ref 20–29)
Calcium: 10.1 mg/dL (ref 8.7–10.2)
Chloride: 105 mmol/L (ref 96–106)
Creatinine, Ser: 0.78 mg/dL (ref 0.57–1.00)
GFR calc Af Amer: 115 mL/min/{1.73_m2} (ref >59)
GFR calc non Af Amer: 99 mL/min/{1.73_m2} (ref >59)
Globulin, Total: 3.2 g/dL (ref 1.5–4.5)
Glucose: 82 mg/dL (ref 65–99)
Potassium: 3.9 mmol/L (ref 3.5–5.2)
Sodium: 139 mmol/L (ref 134–144)
Total Protein: 8.2 g/dL (ref 6.0–8.5)

## 2021-01-07 LAB — TSH: TSH: 0.896 u[IU]/mL (ref 0.450–4.500)

## 2021-01-08 NOTE — Assessment & Plan Note (Signed)
Patient's weight decreased again since her last visit. Has been near this current weight before but it was back in 2014. Her symptoms of upset stomach after starting the Celexa resolved after a few weeks. Diarrhea has never been a complaint of hers. Patient not currently complaining of stomach issues or appetite change but still losing weight. We'll get TSH and CMP today. If patient's weight does not stabilize will need to asked patient to keep food diary at next visit and consider referral for GI.

## 2021-01-08 NOTE — Assessment & Plan Note (Signed)
Patient's complaints of leg pain likely secondary to her anxiety or an increase in the medication used to treat her anxiety, especially given her normal physical exam and lab work. Patient has had other episodes of complaints in other parts of her body in the recent past including chest pain and abdominal pain that subsequently get worked up without a cause found. Symptoms were improving with therapy and the 5 mg of Celexa, but patient became more anxious with the start of going back to work and an increase in Celexa dose to 10 mg does not appear to have helped but has led to other symptoms such as sleeplessness. - Decrease Celexa to 5 mg - TSH and CMP - Consider adjunctive agent at next visit.

## 2021-01-15 DIAGNOSIS — R0789 Other chest pain: Secondary | ICD-10-CM | POA: Diagnosis not present

## 2021-01-15 DIAGNOSIS — R069 Unspecified abnormalities of breathing: Secondary | ICD-10-CM | POA: Diagnosis not present

## 2021-01-15 DIAGNOSIS — R0602 Shortness of breath: Secondary | ICD-10-CM | POA: Diagnosis not present

## 2021-01-15 DIAGNOSIS — R079 Chest pain, unspecified: Secondary | ICD-10-CM | POA: Diagnosis not present

## 2021-01-19 ENCOUNTER — Other Ambulatory Visit (HOSPITAL_COMMUNITY)
Admission: RE | Admit: 2021-01-19 | Discharge: 2021-01-19 | Disposition: A | Discharge status: Home / Self Care | Payer: BC Managed Care – PPO | Source: Ambulatory Visit | Attending: Family Medicine | Admitting: Family Medicine

## 2021-01-19 ENCOUNTER — Ambulatory Visit (INDEPENDENT_AMBULATORY_CARE_PROVIDER_SITE_OTHER): Payer: BC Managed Care – PPO | Admitting: Family Medicine

## 2021-01-19 ENCOUNTER — Other Ambulatory Visit: Payer: Self-pay

## 2021-01-19 ENCOUNTER — Encounter: Payer: Self-pay | Admitting: Family Medicine

## 2021-01-19 VITALS — BP 122/68 | HR 70 | Ht 68.0 in | Wt 149.0 lb

## 2021-01-19 DIAGNOSIS — Z01812 Encounter for preprocedural laboratory examination: Secondary | ICD-10-CM

## 2021-01-19 DIAGNOSIS — B977 Papillomavirus as the cause of diseases classified elsewhere: Secondary | ICD-10-CM | POA: Insufficient documentation

## 2021-01-19 DIAGNOSIS — N870 Mild cervical dysplasia: Secondary | ICD-10-CM | POA: Diagnosis not present

## 2021-01-19 LAB — POCT PREGNANCY, URINE: Preg Test, Ur: NEGATIVE

## 2021-01-19 NOTE — Progress Notes (Signed)
    GYNECOLOGY OFFICE COLPOSCOPY PROCEDURE NOTE  35 y.o. [id number] here for colposcopy for no abnormalities pap smear on 08/2020 but + for HR HPV 18/45 . Discussed role for HPV in cervical dysplasia, need for surveillance.  Patient gave informed written consent, time out was performed.  Placed in lithotomy position. Cervix viewed with speculum and colposcope after application of acetic acid.   Colposcopy adequate? Yes  no visible lesions; biopsies taken at 11, 2, and 7 o'clock.  ECC specimen obtained. All specimens were labeled and sent to pathology.  Patient was given post procedure instructions.  Will follow up pathology and manage accordingly; patient will be contacted with results and recommendations.  Routine preventative health maintenance measures emphasized.  Discussed Gardasil, and would absolutely recommend this at this time.    Reva Bores 01/19/2021 9:09 AM

## 2021-01-19 NOTE — Patient Instructions (Signed)
[email or web address]  Colposcopy, Care After This sheet gives you information about how to care for yourself after your procedure. Your health care provider may also give you more specific instructions. If you have problems or questions, contact your health care provider. What can I expect after the procedure? If you had a colposcopy without a biopsy, you can expect to feel fine right away after your procedure. However, you may have some spotting of blood for a few days. You can return to your normal activities. If you had a colposcopy with a biopsy, it is common after the procedure to have:  Soreness and mild pain. These may last for a few days.  Light-headedness.  Mild vaginal bleeding or discharge that is dark-colored and grainy. This may last for a few days. The discharge may be caused by a liquid (solution) that was used during the procedure. You may need to wear a sanitary pad during this time.  Spotting of blood for at least 48 hours after the procedure. Follow these instructions at home: Medicines  Take over-the-counter and prescription medicines only as told by your health care provider.  Talk with your health care provider about what type of over-the-counter pain medicine and prescription medicine you can start to take again. It is especially important to talk with your health care provider if you take blood thinners. Activity  Limit your physical activity for the first day after your procedure as told by your health care provider.  Avoid using douche products, using tampons, or having sex for at least 3 days after the procedure or for as long as told.  Return to your normal activities as told by your health care provider. Ask your health care provider what activities are safe for you. General instructions  Drink enough fluid to keep your urine pale yellow.  Ask your health care provider if you may take baths, swim, or use a hot tub. You may take  showers.  If you use birth control (contraception), continue to use it.  Keep all follow-up visits as told by your health care provider. This is important.   Contact a health care provider if:  You develop a skin rash. Get help right away if:  You bleed a lot from your vagina or pass blood clots. This includes using more than one sanitary pad each hour for 2 hours in a row.  You have a fever or chills.  You have vaginal discharge that is abnormal, is yellow in color, or smells bad. This could be a sign of infection.  You have severe pain or cramps in your lower abdomen that do not go away with medicine.  You faint. Summary  If you had a colposcopy without a biopsy, you can expect to feel fine right away, but you may have some spotting of blood for a few days. You can return to your normal activities.  If you had a colposcopy with a biopsy, it is common to have mild pain for a few days and spotting for 48 hours after the procedure.  Avoid using douche products, using tampons, and having sex for at least 3 days after the procedure or for as long as told by your health care provider.  Get help right away if you have heavy bleeding, severe pain, or signs of infection. This information is not intended to replace advice given to you by your health care provider. Make sure you discuss any questions you have with your health care provider. Document Revised: 12/02/2019 Document Reviewed:   12/02/2019 Elsevier Patient Education  2021 [employer]vier Inc.  

## 2021-01-23 ENCOUNTER — Telehealth: Payer: Self-pay | Admitting: Family Medicine

## 2021-01-23 ENCOUNTER — Telehealth: Payer: Self-pay | Admitting: Lactation Services

## 2021-01-23 LAB — SURGICAL PATHOLOGY

## 2021-01-23 NOTE — Telephone Encounter (Signed)
LVM for pt to call back to resch appt from 02-13-21.

## 2021-01-23 NOTE — Telephone Encounter (Signed)
Called patient in regards to Colposcopy results. Reviewed results with patient. Reviewed that she will return in 1 year for repeat.   Patient interested in HPV vaccine, She has an appt on 2/28 for Depo and can ger her Gardasil at the same time.   Patient voiced she needs to change her Depo appt, sent message to front desk to reschedule.

## 2021-01-24 DIAGNOSIS — R457 State of emotional shock and stress, unspecified: Secondary | ICD-10-CM | POA: Diagnosis not present

## 2021-01-24 DIAGNOSIS — R1084 Generalized abdominal pain: Secondary | ICD-10-CM | POA: Diagnosis not present

## 2021-01-24 DIAGNOSIS — R52 Pain, unspecified: Secondary | ICD-10-CM | POA: Diagnosis not present

## 2021-01-24 DIAGNOSIS — R1010 Upper abdominal pain, unspecified: Secondary | ICD-10-CM | POA: Diagnosis not present

## 2021-01-25 ENCOUNTER — Ambulatory Visit (INDEPENDENT_AMBULATORY_CARE_PROVIDER_SITE_OTHER): Payer: BC Managed Care – PPO | Admitting: Family Medicine

## 2021-01-25 ENCOUNTER — Encounter: Payer: Self-pay | Admitting: Family Medicine

## 2021-01-25 ENCOUNTER — Other Ambulatory Visit: Payer: Self-pay

## 2021-01-25 DIAGNOSIS — F419 Anxiety disorder, unspecified: Secondary | ICD-10-CM

## 2021-01-25 NOTE — Progress Notes (Signed)
    SUBJECTIVE:   CHIEF COMPLAINT / HPI:   Patient has not been taking her Lexapro because when she switched pharmacies the pill shape was different and the new pills made her feel "out of it" and "loopy".  She states she is actually feeling better without the medication.  She feels like the medication was "suppressing" the anxiety and now she has her feelings back.  She is also currently driving whereas before this was a trigger for her anxiety.  She still feels like she has gas in her chest which she thinks can trigger her anxiety episodes.  She will drink hot water to make herself burp and when this happens it makes her feel better and relieves her anxiety.  Also states that the pain in her thighs is not bothering her nearly as much now.  PERTINENT  PMH / PSH: anxiety  OBJECTIVE:   BP 110/82   Pulse 69   Ht 5\' 8"  (1.727 m)   Wt 151 lb 3.2 oz (68.6 kg)   SpO2 100%   BMI 22.99 kg/m   General: alert, oriented.  No acute distress. CV: Regular rate and rhythm, no murmurs Pulmonary: Lungs good auscultation bilaterally Psych: Pleasant affect.  Does not appear anxious.  ASSESSMENT/PLAN:   Anxiety Patient has been off her medication since our last visit.  Appears to be doing better without medication at this time.  Discussed with patient that the medication is designed to help her symptoms and if she feels better off of it we can continue to hold off and always start again in the future if things change.  Advised her she can continue to drink hot water as needed to relieve the gas/bloating.  Also recommended over-the-counter treatments including simethicone or Beano.  Patient to follow-up in 2 to 4 weeks.     , MD [facility]

## 2021-01-25 NOTE — Patient Instructions (Addendum)
It was nice to see you again today,  For now we will stop with the Lexapro.  You can continue with the Atarax as needed.    For your gas/bloating.  You can continue to drink hot water as needed.  There are also multiple over-the-counter gas medications that you can try.  First I would try Gas-X extra strength.  The active ingredient is simethicone.  Another 1 you can try is beano.  This helps you digest carbohydrates.  There are also different teas you can try.  Follow-up with me in the next 2 to 4 weeks.  Have a great day,  Frederic Jericho, MD

## 2021-01-28 NOTE — Assessment & Plan Note (Signed)
Patient has been off her medication since our last visit.  Appears to be doing better without medication at this time.  Discussed with patient that the medication is designed to help her symptoms and if she feels better off of it we can continue to hold off and always start again in the future if things change.  Advised her she can continue to drink hot water as needed to relieve the gas/bloating.  Also recommended over-the-counter treatments including simethicone or Beano.  Patient to follow-up in 2 to 4 weeks.

## 2021-02-13 ENCOUNTER — Encounter: Payer: Self-pay | Admitting: Gastroenterology

## 2021-02-13 ENCOUNTER — Ambulatory Visit: Payer: BC Managed Care – PPO

## 2021-02-16 ENCOUNTER — Encounter: Payer: Self-pay | Admitting: Family Medicine

## 2021-02-16 ENCOUNTER — Other Ambulatory Visit: Payer: Self-pay

## 2021-02-16 ENCOUNTER — Ambulatory Visit: Payer: BC Managed Care – PPO | Admitting: Family Medicine

## 2021-02-16 ENCOUNTER — Ambulatory Visit (INDEPENDENT_AMBULATORY_CARE_PROVIDER_SITE_OTHER): Payer: BC Managed Care – PPO | Admitting: Family Medicine

## 2021-02-16 DIAGNOSIS — F419 Anxiety disorder, unspecified: Secondary | ICD-10-CM | POA: Diagnosis not present

## 2021-02-16 DIAGNOSIS — K219 Gastro-esophageal reflux disease without esophagitis: Secondary | ICD-10-CM | POA: Diagnosis not present

## 2021-02-16 NOTE — Assessment & Plan Note (Signed)
Patient still complaining of feelings of bloating and gas.  Questionable effectiveness of the medication she has tried so far: Pepcid, Gas-X, dietary changes, sodium bicarbonate.  Has a questionable history of Crohn's as well (no results, just patient reported history).  Patient has ointment with gastroenterologist in April.  Advised patient to follow-up with me after that.  Will advise patient to use Pepcid every day and a PPI as needed in the meantime.  Advised patient to continue treatments that she believes are effective and to discontinue once that are not effective.

## 2021-02-16 NOTE — Assessment & Plan Note (Signed)
Patient endorses much improved anxiety off of any medications.  Scores a 1 on the GAD-7.  Scores a 0 on the PHQ-9.  Advised patient to follow-up as needed but for now will continue to manage with therapy.

## 2021-02-16 NOTE — Progress Notes (Signed)
    SUBJECTIVE:   CHIEF COMPLAINT / HPI:   Anxiety: Patient states her anxiety is much improved since her last visit.  She is currently not taking any antianxiety medications.  Gas: Patient still complaining of excessive gas/burping.  This leads to a "pressure" in her chest that is resolved with burping.  She has tried taking Pepcid every other day, Gas-X as needed, baking soda with water, and ginger/ginger ale.  These are questionably effective.  Also drinks warm water to make herself burp.  Still has some Prilosec from previous prescription but does not use it currently.  PERTINENT  PMH / PSH: Anxiety  OBJECTIVE:   BP 112/82   Pulse 63   Ht 5\' 8"  (1.727 m)   Wt 152 lb 12.8 oz (69.3 kg)   SpO2 100%   BMI 23.23 kg/m   General: Alert and oriented.  No acute distress CV: Regular rate and rhythm, no murmurs Pulmonary: Lungs clear auscultation bilaterally Psych: Pleasant affect, no noticeable anxiety today.  Smiling., ASSESSMENT/PLAN:   Gastroesophageal reflux disease Patient still complaining of feelings of bloating and gas.  Questionable effectiveness of the medication she has tried so far: Pepcid, Gas-X, dietary changes, sodium bicarbonate.  Has a questionable history of Crohn's as well (no results, just patient reported history).  Patient has ointment with gastroenterologist in April.  Advised patient to follow-up with me after that.  Will advise patient to use Pepcid every day and a PPI as needed in the meantime.  Advised patient to continue treatments that she believes are effective and to discontinue once that are not effective.  Anxiety Patient endorses much improved anxiety off of any medications.  Scores a 1 on the GAD-7.  Scores a 0 on the PHQ-9.  Advised patient to follow-up as needed but for now will continue to manage with therapy.     May, MD [facility]

## 2021-02-16 NOTE — Progress Notes (Deleted)
Taking pepcid once every other day.  Questionable if it helps.  Pressure on chest relieved with burping.  Gas-x, baking soda and water, ginger ale/ginger.  Burping all the time and passing gas all the time.  Warm water makes her burp which relieves pressure occasionally.

## 2021-02-16 NOTE — Patient Instructions (Signed)
It was nice to see you again today,  I am glad that your anxiety is doing much better.  As far as the feelings of gas and bloating are concerned, I would like you to do the following: -Start taking your Pepcid once a day every day in the morning. -If you feel like you need something later in the day if your symptoms are worse than normal you can take a Prilosec as needed.  Do not take more than 1 Prilosec in the day.  For the other medications you ordered taking such as Gas-X, only take them if you feel like they are helping.  Otherwise you can stop taking them.  Please follow-up with me after your gastroenterology appointment.  Have a great day,  Frederic Jericho, MD

## 2021-02-27 ENCOUNTER — Encounter: Payer: Self-pay | Admitting: *Deleted

## 2021-02-27 ENCOUNTER — Other Ambulatory Visit: Payer: Self-pay

## 2021-02-27 ENCOUNTER — Ambulatory Visit (INDEPENDENT_AMBULATORY_CARE_PROVIDER_SITE_OTHER): Payer: BC Managed Care – PPO | Admitting: *Deleted

## 2021-02-27 VITALS — BP 123/83 | HR 67 | Ht 68.0 in | Wt 153.3 lb

## 2021-02-27 DIAGNOSIS — Z3042 Encounter for surveillance of injectable contraceptive: Secondary | ICD-10-CM

## 2021-02-27 MED ORDER — MEDROXYPROGESTERONE ACETATE 150 MG/ML IM SUSP
150.0000 mg | Freq: Once | INTRAMUSCULAR | Status: AC
  Administered 2021-02-27: 150 mg via INTRAMUSCULAR

## 2021-02-27 NOTE — Progress Notes (Addendum)
Depo Provera 150 mg IM administered as scheduled. Pt tolerated well. Next injection due 5/30-6/13. Annual Gyn exam w/Pap due 01/20/22. Pt was also scheduled to receive 1st Gardasil vaccine however stated she would like to postpone that for one month because she is concerned for problems with medication interaction. I advised that it is safe to receive both on the same Kadon Andrus. She still would like to wait - appt will be rescheduled.   Chart reviewed for nurse visit. Agree with plan of care.   Currie Paris, NP 02/27/2021 1:20 PM

## 2021-03-03 ENCOUNTER — Other Ambulatory Visit: Payer: Self-pay

## 2021-03-03 ENCOUNTER — Emergency Department (HOSPITAL_COMMUNITY): Payer: BC Managed Care – PPO

## 2021-03-03 ENCOUNTER — Encounter (HOSPITAL_COMMUNITY): Payer: Self-pay | Admitting: *Deleted

## 2021-03-03 ENCOUNTER — Emergency Department (HOSPITAL_COMMUNITY)
Admission: EM | Admit: 2021-03-03 | Discharge: 2021-03-03 | Disposition: A | Discharge status: Home / Self Care | Payer: BC Managed Care – PPO | Attending: Emergency Medicine | Admitting: Emergency Medicine

## 2021-03-03 DIAGNOSIS — R10816 Epigastric abdominal tenderness: Secondary | ICD-10-CM | POA: Insufficient documentation

## 2021-03-03 DIAGNOSIS — K2100 Gastro-esophageal reflux disease with esophagitis, without bleeding: Secondary | ICD-10-CM | POA: Insufficient documentation

## 2021-03-03 DIAGNOSIS — R0602 Shortness of breath: Secondary | ICD-10-CM | POA: Insufficient documentation

## 2021-03-03 DIAGNOSIS — R079 Chest pain, unspecified: Secondary | ICD-10-CM | POA: Diagnosis not present

## 2021-03-03 DIAGNOSIS — R0789 Other chest pain: Secondary | ICD-10-CM | POA: Diagnosis not present

## 2021-03-03 LAB — HEPATIC FUNCTION PANEL
ALT: 17 U/L (ref 0–44)
AST: 17 U/L (ref 15–41)
Albumin: 4.7 g/dL (ref 3.5–5.0)
Alkaline Phosphatase: 66 U/L (ref 38–126)
Bilirubin, Direct: <0.1 mg/dL (ref 0.0–0.2)
Total Bilirubin: 0.8 mg/dL (ref 0.3–1.2)
Total Protein: 8.3 g/dL — ABNORMAL HIGH (ref 6.5–8.1)

## 2021-03-03 LAB — CBC
HCT: 40 % (ref 36.0–46.0)
Hemoglobin: 13.1 g/dL (ref 12.0–15.0)
MCH: 28.6 pg (ref 26.0–34.0)
MCHC: 32.8 g/dL (ref 30.0–36.0)
MCV: 87.3 fL (ref 80.0–100.0)
Platelets: 267 10*3/uL (ref 150–400)
RBC: 4.58 MIL/uL (ref 3.87–5.11)
RDW: 13.8 % (ref 11.5–15.5)
WBC: 5.4 10*3/uL (ref 4.0–10.5)
nRBC: 0 % (ref 0.0–0.2)

## 2021-03-03 LAB — TROPONIN I (HIGH SENSITIVITY)
Troponin I (High Sensitivity): <2 ng/L (ref <18)
Troponin I (High Sensitivity): <2 ng/L (ref <18)

## 2021-03-03 LAB — LIPASE, BLOOD: Lipase: 34 U/L (ref 11–51)

## 2021-03-03 LAB — I-STAT BETA HCG BLOOD, ED (MC, WL, AP ONLY): I-stat hCG, quantitative: <5 m[IU]/mL (ref <5)

## 2021-03-03 LAB — BASIC METABOLIC PANEL
Anion gap: 7 (ref 5–15)
BUN: 8 mg/dL (ref 6–20)
CO2: 21 mmol/L — ABNORMAL LOW (ref 22–32)
Calcium: 9.5 mg/dL (ref 8.9–10.3)
Chloride: 108 mmol/L (ref 98–111)
Creatinine, Ser: 0.7 mg/dL (ref 0.44–1.00)
GFR, Estimated: >60 mL/min (ref >60)
Glucose, Bld: 84 mg/dL (ref 70–99)
Potassium: 3.9 mmol/L (ref 3.5–5.1)
Sodium: 136 mmol/L (ref 135–145)

## 2021-03-03 MED ORDER — ALUM & MAG HYDROXIDE-SIMETH 200-200-20 MG/5ML PO SUSP
30.0000 mL | Freq: Once | ORAL | Status: AC
  Administered 2021-03-03: 30 mL via ORAL
  Filled 2021-03-03: qty 30

## 2021-03-03 MED ORDER — OMEPRAZOLE 20 MG PO CPDR: 20.0000 mg | DELAYED_RELEASE_CAPSULE | Freq: Every day | ORAL | 0 refills | Status: DC

## 2021-03-03 MED ORDER — LIDOCAINE VISCOUS HCL 2 % MT SOLN
15.0000 mL | Freq: Once | OROMUCOSAL | Status: AC
  Administered 2021-03-03: 15 mL via ORAL
  Filled 2021-03-03: qty 15

## 2021-03-03 NOTE — ED Triage Notes (Signed)
Pt is here for CP and sob.  Pt reports that she has pressure on her chest and it feels like something is sitting on her chest.  Pt denies any stress lately and does not feel this is anxiety (previously seen for anxiety and CP). No URI recently.

## 2021-03-03 NOTE — ED Notes (Signed)
ED Provider at bedside. 

## 2021-03-03 NOTE — ED Notes (Signed)
Ambulated to BR without difficulty

## 2021-03-03 NOTE — Discharge Instructions (Signed)
Please pick up medication and take as prescribed on a daily basis in addition to the pepcid you are already taking.   Attached is information on acid reflux and food choices. I would also recommend eating dinner > 3 hours before going to sleep/laying flat as this can help prevent acid build up at nighttime.   Follow up with Dr. Lynett Fish GI as scheduled in April. You can see if there are earlier appointments.   Return to the ED for any worsening symptoms

## 2021-03-03 NOTE — ED Provider Notes (Signed)
[facility] EMERGENCY DEPARTMENT Provider Note   CSN: [id number] Arrival date & time: 03/03/21  0915     History Chief Complaint  Patient presents with  . Chest Pain    Katherine Lindsey is a 35 y.o. female with PMHx anxiety and GERD who presents to the ED today with ongoing diffuse chest pain x 2 months. Pt also complains of an intermittent burning sensation in her chest and throat. She reports this will make her feel like she cannot breathe. Pt reports that she attempted to go see her PCP today however they had no openings and told her to come to the ED instead.   Per chart review:  Pt was seen in the ED on 1/20 for chest pain. Workup was negative at that time including CTA to rule out PE given she has COVID early January.   Pt then had PCP visit on 03/03 for acid reflux. She is already taking pepcid 20 mg BID. She was prescribed omeprazole 20 mg to take PRN.   Pt reports that she has been taking the pepcid as prescribed and the omeprazole as needed however does not feel like it is helping her much. She does have an appointment scheduled with GI in April however reports she needs relief of her symptoms sooner than that. She does report history of anxiety and feels like these symptoms are causing her anxiety to get slightly worse. Pt has been trying to eat more fruits and vegetables and stay away from red meat and other things that could cause worsening chest pain for her. She states that she has had a lot of belching as well and has only very minimal relief with belching however her pain is always present. She eats dinner around 7-8 PM and goes to bed around 10-11 PM. She reports that the symptoms do seem worse at nighttime when she is trying to fall asleep. No other complaints at this time.   The history is provided by the patient and medical records.       Past Medical History:  Diagnosis Date  . Anxiety   . Crohn's disease in remission [facility])    Hasn't had  symptoms since 2004  . Gallstones   . Hammertoe 09/21/2014  . History of low transverse cesarean section 11/28/2015  . VBAC, delivered 03/19/2016    Patient Active Problem List   Diagnosis Date Noted  . Weight loss 11/18/2020  . Gastroesophageal reflux disease 08/29/2020  . Nonintractable episodic headache 01/06/2020  . Anxiety 07/15/2019  . Hidradenitis suppurativa 07/15/2019  . High risk HPV infection 02/07/2017  . Vaginal spotting 10/17/2012    Past Surgical History:  Procedure Laterality Date  . CESAREAN SECTION N/A 06/16/2013   Procedure: CESAREAN SECTION;  Surgeon: Reva Bores, MD;  Location: [facility]S;  Service: Obstetrics;  Laterality: N/A;  . CHOLECYSTECTOMY  08/28/2012   Procedure: LAPAROSCOPIC CHOLECYSTECTOMY WITH INTRAOPERATIVE CHOLANGIOGRAM;  Surgeon: [name], DO;  Location: [facility] OR;  Service: General;  Laterality: N/A;  . INDUCED ABORTION  02/2012     OB History    Gravida  4   Para  3   Term  3   Preterm      AB  1   Living  3     SAB  0   IAB  1   Ectopic      Multiple  0   Live Births  3        Obstetric Comments  2014 LTCS  with extension of hysterotomy inferiorly        Family History  Problem Relation Age of Onset  . Other Neg Hx   . Diabetes Neg Hx   . Heart disease Neg Hx   . Hyperlipidemia Neg Hx   . Hypertension Neg Hx   . Stroke Neg Hx     Social History   Tobacco Use  . Smoking status: Never Smoker  . Smokeless tobacco: Never Used  Substance Use Topics  . Alcohol use: Yes    Alcohol/week: 1.0 standard drink    Types: 1 Glasses of wine per week  . Drug use: No    Home Medications Prior to Admission medications   Medication Sig Start Date End Date Taking? Authorizing Provider  famotidine (PEPCID) 20 MG tablet Take 20 mg by mouth daily.   Yes [provider]  medroxyPROGESTERone (DEPO-PROVERA) 150 MG/ML injection Inject 150 mg into the muscle every 3 (three) months.   Yes [provider]   omeprazole (PRILOSEC) 20 MG capsule Take 20 mg by mouth daily as needed (acid reflux).   Yes [provider]  omeprazole (PRILOSEC) 20 MG capsule Take 1 capsule (20 mg total) by mouth daily. 03/03/21 04/02/21 Yes Rendy Lazard, PA-C  escitalopram (LEXAPRO) 10 MG tablet Take 1 tablet (10 mg total) by mouth daily. Patient not taking: No sig reported 12/23/20   Sandre Kitty, MD  promethazine (PHENERGAN) 25 MG tablet Take 1 tablet (25 mg total) by mouth every 8 (eight) hours as needed (headache). Patient not taking: Reported on 07/14/2020 03/28/20 08/16/20  Gilda Crease, MD  SUMAtriptan (IMITREX) 50 MG tablet Take 1 tablet (50 mg total) by mouth every 2 (two) hours as needed for migraine. May repeat in 2 hours if headache persists or recurs. Patient not taking: Reported on 07/14/2020 01/27/20 08/16/20  Gailen Shelter, PA    Allergies    Patient has no known allergies.  Review of Systems   Review of Systems  Constitutional: Negative for chills and fever.  Respiratory: Positive for shortness of breath.   Cardiovascular: Positive for chest pain.  Gastrointestinal: Negative for nausea and vomiting.  All other systems reviewed and are negative.   Physical Exam Updated Vital Signs BP 126/82 (BP Location: Right Arm)   Pulse 65   Temp 98.5 F (36.9 C)   Resp 18   Wt 69.4 kg   SpO2 100%   BMI 23.26 kg/m   Physical Exam Vitals and nursing note reviewed.  Constitutional:      Appearance: She is not ill-appearing.  HENT:     Head: Normocephalic and atraumatic.  Eyes:     Conjunctiva/sclera: Conjunctivae normal.  Cardiovascular:     Rate and Rhythm: Normal rate and regular rhythm.     Pulses:          Radial pulses are 2+ on the right side and 2+ on the left side.       Dorsalis pedis pulses are 2+ on the right side and 2+ on the left side.     Heart sounds: Normal heart sounds.  Pulmonary:     Effort: Pulmonary effort is normal.     Breath sounds: Normal breath  sounds. No decreased breath sounds, wheezing, rhonchi or rales.  Chest:     Chest wall: No tenderness.  Abdominal:     Palpations: Abdomen is soft.     Tenderness: There is abdominal tenderness. There is no guarding or rebound.     Comments:  Soft, + epigastric abdominal TTP, +BS throughout, no r/g/r, neg murphy's, neg mcburney's, no CVA TTP  Musculoskeletal:     Cervical back: Neck supple.  Skin:    General: Skin is warm and dry.  Neurological:     Mental Status: She is alert.     ED Results / Procedures / Treatments   Labs (all labs ordered are listed, but only abnormal results are displayed) Labs Reviewed  BASIC METABOLIC PANEL - Abnormal; Notable for the following components:      Result Value   CO2 21 (*)    All other components within normal limits  HEPATIC FUNCTION PANEL - Abnormal; Notable for the following components:   Total Protein 8.3 (*)    All other components within normal limits  CBC  LIPASE, BLOOD  I-STAT BETA HCG BLOOD, ED ([facility], [facility], [facility] ONLY)  TROPONIN I (HIGH SENSITIVITY)  TROPONIN I (HIGH SENSITIVITY)    EKG None  Radiology DG Chest 2 View  Result Date: 03/03/2021 CLINICAL DATA:  Chest pain and shortness of breath. EXAM: CHEST - 2 VIEW COMPARISON:  Chest radiograph January 05, 2021. FINDINGS: The heart size and mediastinal contours are within normal limits. No focal consolidation. No pleural effusion. No pneumothorax. The visualized skeletal structures are unremarkable. IMPRESSION: No active cardiopulmonary disease. Electronically Signed   By: Maudry Mayhew MD   On: 03/03/2021 10:09    Procedures Procedures   Medications Ordered in ED Medications  alum & mag hydroxide-simeth (MAALOX/MYLANTA) 200-200-20 MG/5ML suspension 30 mL (30 mLs Oral Given 03/03/21 1157)    And  lidocaine (XYLOCAINE) 2 % viscous mouth solution 15 mL (15 mLs Oral Given 03/03/21 1157)    ED Course  I have reviewed the triage vital signs and the nursing notes.  Pertinent labs  & imaging results that were available during my care of the patient were reviewed by me and considered in my medical decision making (see chart for details).    MDM Rules/Calculators/A&P                          35 year old female presents to the ED today complaining of chest pain and shortness of breath with a burning sensation and increased belching for the past 2 months.  Seen in the ED initially for same with a negative work-up including a negative CTA for PE.  Also seen by PCP recently with addition of omeprazole for acid reflux.  Has been taking as needed as well as Pepcid twice daily.  She has an appointment with GI in April however began having worsening pain today and cannot see her PCP prompting them to advised her to come to the ED for same.  On arrival to the ED vitals are stable.  Patient is afebrile, nontachycardic nontachypneic and appears to be in no acute distress.  She does have some epigastric abdominal tenderness palpation on exam.  She is able speak in full sentences without difficulty and has no chest wall tenderness palpation.  Lab work including EKG, chest x-ray, troponin as well as screening labs of CBC and BMP collected while in the waiting room.  Given epigastric pain will add on lipase and LFTs at this time.  I suspect her symptoms are likely more related to acid reflux versus ACS given she is only 35.  She has equal pulses bilaterally and I doubt dissection.  I do not feel she needs repeat work-up for CTA at this time.  Will provide GI  cocktail and see if this helps.  Repeat troponin < 2 LFTs unremarkable Lipase 34  On reevaluation pt resting comfortably and appears to be in NAD. Reports improvement after GI cocktail. Will discharge home at this time. Pt instructed to take the omeprazole daily and not as needed. She is encouraged to follow up with GI to see if they can get her an appointment sooner than 04/07. She is instructed on foods to avoid. She is in agreement with plan  and stable for discharge home.   This note was prepared using Dragon voice recognition software and may include unintentional dictation errors due to the inherent limitations of voice recognition software.  Final Clinical Impression(s) / ED Diagnoses Final diagnoses:  Gastroesophageal reflux disease with esophagitis without hemorrhage    Rx / DC Orders ED Discharge Orders         Ordered    omeprazole (PRILOSEC) 20 MG capsule  Daily        03/03/21 1452           Discharge Instructions     Please pick up medication and take as prescribed on a daily basis in addition to the pepcid you are already taking.   Attached is information on acid reflux and food choices. I would also recommend eating dinner > 3 hours before going to sleep/laying flat as this can help prevent acid build up at nighttime.   Follow up with Dr. Lynett Fish GI as scheduled in April. You can see if there are earlier appointments.   Return to the ED for any worsening symptoms       Tanda Rockers, PA-C 03/03/21 1453    Gerhard Munch, MD 03/06/21 [phone]

## 2021-03-14 DIAGNOSIS — R0789 Other chest pain: Secondary | ICD-10-CM | POA: Diagnosis not present

## 2021-03-14 DIAGNOSIS — R0602 Shortness of breath: Secondary | ICD-10-CM | POA: Diagnosis not present

## 2021-03-14 DIAGNOSIS — R079 Chest pain, unspecified: Secondary | ICD-10-CM | POA: Diagnosis not present

## 2021-03-15 ENCOUNTER — Encounter: Payer: Self-pay | Admitting: General Practice

## 2021-03-16 NOTE — Progress Notes (Signed)
SUBJECTIVE:   CHIEF COMPLAINT / HPI:   Esophageal pain: Patient has several month h/o pain from throat to epigastrium, with increased belching and chest pressure that began in January and has progressively worsened to now. These symptoms wake her from sleep at night. She has used hot and cold water, gas x, H2 blockers, and PPI without improvement. She reports that the "GI cocktail" at the ED helped somewhat. GAD-7 0 on 3/3 and PHQ9 1 on 3/3. On 1/20 and 3/18 she was seen in the ED, CTA w/ no e/o PE, CXR WNL, CBC WNL, TSH WNL, Troponin WNL. Of note, she has a h/o GAD and stopped lexapro in January- shortly before these symptoms began. She reports that she had muscle cramping with lexapro and did not feel like herself, which is why she discontinued the medication. She has not tried any other SSRIs. She reports a h/o crohn's disease as a child. She reports a colonoscopy as a child, but then states that her symptoms went away for most of her life until now. No family h/o Crohn's, reports that her MGM had stomach cancer late in life. She denies EtOH use, tobacco use, or recreational drug use. She is s/p cholecystectomy, though without a diagnosis for cholecystitis, etc. She has an appt with GI on 4/7 at 8:50 AM.   PERTINENT  PMH / PSH: Crohn's disease?, GAD  OBJECTIVE:   BP 120/77   Pulse 65   Ht 5\' 8"  (1.727 m)   Wt 155 lb 9.6 oz (70.6 kg)   SpO2 100%   BMI 23.66 kg/m   Nursing note and vitals reviewed GEN: age-appropriate, AAW, resting comfortably in chair, NAD, WNWD HEENT: NCAT. Sclera without injection or icterus. MMM.  Neck: Supple. No LAD, thyroid not palpable Cardiac: Regular rate and rhythm. Normal S1/S2. No murmurs, rubs, or gallops appreciated. 2+ radial pulses. Lungs: Clear bilaterally to ascultation. No increased WOB, no accessory muscle usage. No w/r/r. Abdomen: Soft. Normoactive bowel sounds. No tenderness to deep or light palpation. No rebound or guarding.   Neuro: Alert and  at baseline Ext: no edema Psych: Pleasant and appropriate  ASSESSMENT/PLAN:   Functional dyspepsia Patient with worsening sx of dyspepsia despite extensive work up. She is at low risk for cardiac disease, no anemia, thyroid is WNL, no e/o DVT, EKG NSR, troponins flat- less likely to be cardiac or pulmonary in nature. So far none of the remedies for GERD have helped symptoms at all, which leads me to think less likely GI in nature, however no definitive e/o this. Patient reports h/o Crohn's as a child, but sx she had were like they are now, not c/w Crohn's. It is also uncommon for Crohn's that her sx were absent so long. However, she is going to see GI next week, she may need EGD/colonoscopy. I suspect due to the timing of the symptoms with discontinuation of SSRI that these symptoms are most likely somatic sx from untreated GAD. Patient does have a therapist, reports overall feeling better, but she may not be identifying these sx as anxiety, consequently GAD and PHQ-9 scores are low. I recommended a trial of another SSRI to see if this improved sx as SSRI's are first line along with CBT for treating GAD. Counseled extensively on how SSRI's work and importance of adherence and 4-6 week trial. Patient opts to speak with GI first on 4/7 and will follow up after that appointment. She will consider trial of SSRI. F/U with PCP on 04/18/21.  Shirlean Mylar, MD [facility]

## 2021-03-17 ENCOUNTER — Ambulatory Visit (HOSPITAL_COMMUNITY)
Admission: RE | Admit: 2021-03-17 | Discharge: 2021-03-17 | Disposition: A | Discharge status: Home / Self Care | Payer: BC Managed Care – PPO | Source: Ambulatory Visit | Attending: Family Medicine | Admitting: Family Medicine

## 2021-03-17 ENCOUNTER — Ambulatory Visit (INDEPENDENT_AMBULATORY_CARE_PROVIDER_SITE_OTHER): Payer: BC Managed Care – PPO | Admitting: Family Medicine

## 2021-03-17 ENCOUNTER — Other Ambulatory Visit: Payer: Self-pay

## 2021-03-17 VITALS — BP 120/77 | HR 65 | Ht 68.0 in | Wt 155.6 lb

## 2021-03-17 DIAGNOSIS — R002 Palpitations: Secondary | ICD-10-CM

## 2021-03-17 DIAGNOSIS — Z1329 Encounter for screening for other suspected endocrine disorder: Secondary | ICD-10-CM

## 2021-03-17 DIAGNOSIS — Z130 Encounter for screening for diseases of the blood and blood-forming organs and certain disorders involving the immune mechanism: Secondary | ICD-10-CM

## 2021-03-17 DIAGNOSIS — R1013 Epigastric pain: Secondary | ICD-10-CM | POA: Diagnosis not present

## 2021-03-17 DIAGNOSIS — K30 Functional dyspepsia: Secondary | ICD-10-CM

## 2021-03-17 MED ORDER — GAVISCON EXTRA STRENGTH 160-105 MG PO CHEW: 2.0000 | CHEWABLE_TABLET | Freq: Three times a day (TID) | ORAL | 0 refills | Status: DC

## 2021-03-17 NOTE — Patient Instructions (Addendum)
It was a pleasure to see you today!  1. You can try gaviscon for your gas pain and heart burn. You can take 2-4 tablets with every meal and before bed if you would like. DO not use more than 16 tablets in 24 hours.  2. Follow up on 5/3 at 8:30 AM. Let us know after your visit with the GI doc if you are interested in trying another SSRI.    Be Well,  Dr. Leary Roca

## 2021-03-20 DIAGNOSIS — K30 Functional dyspepsia: Secondary | ICD-10-CM | POA: Insufficient documentation

## 2021-03-20 NOTE — Assessment & Plan Note (Signed)
Patient with worsening sx of dyspepsia despite extensive work up. She is at low risk for cardiac disease, no anemia, thyroid is WNL, no e/o DVT, EKG NSR, troponins flat- less likely to be cardiac or pulmonary in nature. So far none of the remedies for GERD have helped symptoms at all, which leads me to think less likely GI in nature, however no definitive e/o this. Patient reports h/o Crohn's as a child, but sx she had were like they are now, not c/w Crohn's. It is also uncommon for Crohn's that her sx were absent so long. However, she is going to see GI next week, she may need EGD/colonoscopy. I suspect due to the timing of the symptoms with discontinuation of SSRI that these symptoms are most likely somatic sx from untreated GAD. Patient does have a therapist, reports overall feeling better, but she may not be identifying these sx as anxiety, consequently GAD and PHQ-9 scores are low. I recommended a trial of another SSRI to see if this improved sx as SSRI's are first line along with CBT for treating GAD. Counseled extensively on how SSRI's work and importance of adherence and 4-6 week trial. Patient opts to speak with GI first on 4/7 and will follow up after that appointment. She will consider trial of SSRI. F/U with PCP on 04/18/21.

## 2021-03-23 ENCOUNTER — Ambulatory Visit: Payer: BC Managed Care – PPO | Admitting: Gastroenterology

## 2021-03-27 ENCOUNTER — Other Ambulatory Visit: Payer: Self-pay

## 2021-03-27 ENCOUNTER — Encounter (HOSPITAL_COMMUNITY): Payer: Self-pay

## 2021-03-27 ENCOUNTER — Emergency Department (HOSPITAL_COMMUNITY)
Admission: EM | Admit: 2021-03-27 | Discharge: 2021-03-27 | Disposition: A | Discharge status: Home / Self Care | Payer: BC Managed Care – PPO | Attending: Emergency Medicine | Admitting: Emergency Medicine

## 2021-03-27 ENCOUNTER — Emergency Department (HOSPITAL_COMMUNITY): Payer: BC Managed Care – PPO

## 2021-03-27 ENCOUNTER — Ambulatory Visit (HOSPITAL_COMMUNITY)
Admission: EM | Admit: 2021-03-27 | Discharge: 2021-03-27 | Payer: BC Managed Care – PPO | Attending: Student | Admitting: Student

## 2021-03-27 DIAGNOSIS — K219 Gastro-esophageal reflux disease without esophagitis: Secondary | ICD-10-CM | POA: Diagnosis not present

## 2021-03-27 DIAGNOSIS — R079 Chest pain, unspecified: Secondary | ICD-10-CM | POA: Diagnosis not present

## 2021-03-27 DIAGNOSIS — R0789 Other chest pain: Secondary | ICD-10-CM

## 2021-03-27 LAB — COMPREHENSIVE METABOLIC PANEL
ALT: 14 U/L (ref 0–44)
AST: 16 U/L (ref 15–41)
Albumin: 4.6 g/dL (ref 3.5–5.0)
Alkaline Phosphatase: 64 U/L (ref 38–126)
Anion gap: 9 (ref 5–15)
BUN: 7 mg/dL (ref 6–20)
CO2: 23 mmol/L (ref 22–32)
Calcium: 9.4 mg/dL (ref 8.9–10.3)
Chloride: 104 mmol/L (ref 98–111)
Creatinine, Ser: 0.69 mg/dL (ref 0.44–1.00)
GFR, Estimated: >60 mL/min (ref >60)
Glucose, Bld: 84 mg/dL (ref 70–99)
Potassium: 3.4 mmol/L — ABNORMAL LOW (ref 3.5–5.1)
Sodium: 136 mmol/L (ref 135–145)
Total Bilirubin: 0.9 mg/dL (ref 0.3–1.2)
Total Protein: 7.8 g/dL (ref 6.5–8.1)

## 2021-03-27 LAB — CBC WITH DIFFERENTIAL/PLATELET
Abs Immature Granulocytes: 0.02 10*3/uL (ref 0.00–0.07)
Basophils Absolute: 0 10*3/uL (ref 0.0–0.1)
Basophils Relative: 0 %
Eosinophils Absolute: 0.1 10*3/uL (ref 0.0–0.5)
Eosinophils Relative: 1 %
HCT: 39 % (ref 36.0–46.0)
Hemoglobin: 13 g/dL (ref 12.0–15.0)
Immature Granulocytes: 0 %
Lymphocytes Relative: 38 %
Lymphs Abs: 3 10*3/uL (ref 0.7–4.0)
MCH: 29.4 pg (ref 26.0–34.0)
MCHC: 33.3 g/dL (ref 30.0–36.0)
MCV: 88.2 fL (ref 80.0–100.0)
Monocytes Absolute: 0.6 10*3/uL (ref 0.1–1.0)
Monocytes Relative: 8 %
Neutro Abs: 4.3 10*3/uL (ref 1.7–7.7)
Neutrophils Relative %: 53 %
Platelets: 289 10*3/uL (ref 150–400)
RBC: 4.42 MIL/uL (ref 3.87–5.11)
RDW: 13.8 % (ref 11.5–15.5)
WBC: 8 10*3/uL (ref 4.0–10.5)
nRBC: 0 % (ref 0.0–0.2)

## 2021-03-27 LAB — I-STAT BETA HCG BLOOD, ED (MC, WL, AP ONLY): I-stat hCG, quantitative: <5 m[IU]/mL (ref <5)

## 2021-03-27 LAB — LIPASE, BLOOD: Lipase: 30 U/L (ref 11–51)

## 2021-03-27 LAB — TROPONIN I (HIGH SENSITIVITY)
Troponin I (High Sensitivity): <2 ng/L (ref <18)
Troponin I (High Sensitivity): <2 ng/L (ref <18)

## 2021-03-27 MED ORDER — ALUM & MAG HYDROXIDE-SIMETH 200-200-20 MG/5ML PO SUSP
30.0000 mL | Freq: Once | ORAL | Status: AC
  Administered 2021-03-27: 30 mL via ORAL
  Filled 2021-03-27: qty 30

## 2021-03-27 MED ORDER — LIDOCAINE VISCOUS HCL 2 % MT SOLN
15.0000 mL | Freq: Once | OROMUCOSAL | Status: AC
  Administered 2021-03-27: 15 mL via ORAL
  Filled 2021-03-27: qty 15

## 2021-03-27 NOTE — ED Notes (Signed)
I spoke with Zollie Beckers in the lab regarding no troponin results from the specimen I sent down at 1650.  Zollie Beckers states he will call me back

## 2021-03-27 NOTE — ED Triage Notes (Signed)
Pt presents with recurrent generalized chest pressure, dizziness, and fatigue.  Pt has Hx of anxiety and is no longer on medication.

## 2021-03-27 NOTE — ED Provider Notes (Signed)
  Face-to-face evaluation   History: Patient presents for evaluation of recurrent chest pain.  She has had frequent ED evaluations, but never been evaluated by cardiology.  She is low risk for cardiac disease.  She has GI symptoms including belching, and upper abdominal discomfort.  She takes Pepcid and omeprazole regularly and sometimes uses Mylanta.  These medicines have not stopped her discomfort.  The patient has seen her PCP about this problem already and was referred to GI, she has an appointment later this month.  Physical exam: Heart, calm, cooperative.  No respiratory distress.  She is in the room with 3 of her children.  I discussed the findings and recommendations with the patient.  Decision making-patient with nonspecific and recurrent symptoms, already evaluated by her PCP.  She has been referred to GI which will likely be helpful.  Since she has been in the ED several times will refer to cardiology as well  Medical screening examination/treatment/procedure(s) were conducted as a shared visit with non-physician practitioner(s) and myself.  I personally evaluated the patient during the encounter    Mancel Bale, MD 03/27/21 2059

## 2021-03-27 NOTE — Discharge Instructions (Signed)
-  Head straight to [facility] emergency room for further evaluation and management of chest pain, abdominal pain, and dizziness. -If your symptoms get worse while on the way to the emergency room, stop and call 911.  This includes worsening of dizziness, chest pain, pain in left arm, if you pass out, etc.

## 2021-03-27 NOTE — ED Triage Notes (Signed)
Emergency Medicine Provider Triage Evaluation Note  Katherine Lindsey , a 35 y.o. female  was evaluated in triage.  Pt complains of chest pain and fatigue visit history of anxiety previously on medication.  Does not feel like anxiety.  Does have history of GERD.  She has had intermittent chest pain over the last 3 months.  Was supposed to see Dr. Russella Dar with GI however unfortunately GI had to cancel due to emergency.  Pain is worse when she belches.  No radiation to left arm, left back or jaw.  Chest pain is nonexertional nonpleuritic in nature.  She has been taking her Pepcid and Prilosec as prescribed.  She does feel generally fatigued.  No lateral leg swelling, redness or warmth.  No recent surgery, immobilization or malignancy.  She does feel she gets intermittent swelling to the plantar arch of her bilateral feet.  No history of heart failure.  No PND orthopnea.  Seen by urgent care.  They told her she had some T wave changes and sent her here to emergency department for further evaluation  Did state that she D-dimer drawn previously which was elevated.  She had subsequent CTA of her chest in January which not show any evidence of PE.  Review of Systems  Positive: Chest pain, fatigue, GERD Negative: Syncope, history of PE, DVT.  Physical Exam  There were no vitals taken for this visit. Gen:   Awake, no distress   HEENT:  Atraumatic  Resp:  Normal effort  Cardiac:  Normal rate  Abd:   Nondistended, nontender  MSK:   Moves extremities without difficulty, compartment soft.  No clinical evidence of DVT on exam. Neuro:  Speech clear   Medical Decision Making  Medically screening exam initiated at 3:35 PM.  Appropriate orders placed.  Katherine Lindsey was informed that the remainder of the evaluation will be completed by another provider, this initial triage assessment does not replace that evaluation, and the importance of remaining in the ED until their evaluation is  complete.  Clinical Impression  Chest pain, fatigue, GERD   Katherine Lindsey A, PA-C 03/27/21 1541

## 2021-03-27 NOTE — Discharge Instructions (Addendum)
As discussed, all of your labs were reassuring today. I suspect your symptoms are related to reflux. Continue to take your reflux medication. Please report to your scheduled GI appointment. Return to the ER for new or worsening symptoms.  I have included the number of the cardiologist. Call tomorrow to schedule an appointment for further evaluation of chest pain.

## 2021-03-27 NOTE — ED Provider Notes (Signed)
[facility]    CSN: [id number] Arrival date & time: 03/27/21  1312      History   Chief Complaint Chief Complaint  Patient presents with  . Chest Pressure  . Dizziness    HPI Katherine Lindsey is a 35 y.o. female presenting with generalized chest pressure, dizziness, fatigue.  Medical history of anxiety for which she was previously taking medication but has been off of this.  Also with history of GERD, hidradenitis suppurativa.  Endorses 3 months of intermittent generalized chest pressure, worse centrally; states this feels like something is sitting on her chest.  Denies radiation of the pain to her back or arm.  States she also is having epigastric pain, which is fairly standard for her.  She is taking her Pepcid and Prilosec as directed.  States that today she is experiencing new symptoms of dizziness and fatigue in addition to the chest pain.  Currently endorses 3 days of constant chest pain as well as 30 minutes of dizziness and lightheadedness today.  Denies positional component to the dizziness, states this is constant.  Generalized fatigue.  Denies weakness or sensation changes in arms or legs.  States her primary care told her to go to urgent care for these complaints.  She was supposed to have an appointment with GI on 4/7, but unfortunately they did cancel this and she was able to reschedule this for 4/28.  HPI  Past Medical History:  Diagnosis Date  . Anxiety   . Crohn's disease in remission [facility])    Hasn't had symptoms since 2004  . Gallstones   . Hammertoe 09/21/2014  . History of low transverse cesarean section 11/28/2015  . VBAC, delivered 03/19/2016    Patient Active Problem List   Diagnosis Date Noted  . Functional dyspepsia 03/20/2021  . Weight loss 11/18/2020  . Gastroesophageal reflux disease 08/29/2020  . Nonintractable episodic headache 01/06/2020  . Anxiety 07/15/2019  . Hidradenitis suppurativa 07/15/2019  . High risk HPV infection  02/07/2017  . Vaginal spotting 10/17/2012    Past Surgical History:  Procedure Laterality Date  . CESAREAN SECTION N/A 06/16/2013   Procedure: CESAREAN SECTION;  Surgeon: Reva Bores, MD;  Location: [facility];  Service: Obstetrics;  Laterality: N/A;  . CHOLECYSTECTOMY  08/28/2012   Procedure: LAPAROSCOPIC CHOLECYSTECTOMY WITH INTRAOPERATIVE CHOLANGIOGRAM;  Surgeon: [name], DO;  Location: [facility] OR;  Service: General;  Laterality: N/A;  . INDUCED ABORTION  02/2012    OB History    Gravida  4   Para  3   Term  3   Preterm      AB  1   Living  3     SAB  0   IAB  1   Ectopic      Multiple  0   Live Births  3        Obstetric Comments  2014 LTCS with extension of hysterotomy inferiorly         Home Medications    Prior to Admission medications   Medication Sig Start Date End Date Taking? Authorizing Provider  Alum Hydroxide-Mag Carbonate (GAVISCON EXTRA STRENGTH) 160-105 MG CHEW Chew 2-4 tablets by mouth 4 (four) times daily -  with meals and at bedtime. Do not take more than 16 in a day 03/17/21   Shirlean Mylar, MD  escitalopram (LEXAPRO) 10 MG tablet Take 1 tablet (10 mg total) by mouth daily. Patient not taking: No sig reported 12/23/20   Sandre Kitty, MD  famotidine (PEPCID) 20 MG tablet Take 20 mg by mouth daily.    [provider]  medroxyPROGESTERone (DEPO-PROVERA) 150 MG/ML injection Inject 150 mg into the muscle every 3 (three) months.    [provider]  omeprazole (PRILOSEC) 20 MG capsule Take 20 mg by mouth daily as needed (acid reflux).    [provider]  omeprazole (PRILOSEC) 20 MG capsule Take 1 capsule (20 mg total) by mouth daily. 03/03/21 04/02/21  Tanda Rockers, PA-C  promethazine (PHENERGAN) 25 MG tablet Take 1 tablet (25 mg total) by mouth every 8 (eight) hours as needed (headache). Patient not taking: Reported on 07/14/2020 03/28/20 08/16/20  Gilda Crease, MD  SUMAtriptan (IMITREX) 50 MG tablet Take 1  tablet (50 mg total) by mouth every 2 (two) hours as needed for migraine. May repeat in 2 hours if headache persists or recurs. Patient not taking: Reported on 07/14/2020 01/27/20 08/16/20  Gailen Shelter, PA    Family History Family History  Problem Relation Age of Onset  . Other Neg Hx   . Diabetes Neg Hx   . Heart disease Neg Hx   . Hyperlipidemia Neg Hx   . Hypertension Neg Hx   . Stroke Neg Hx     Social History Social History   Tobacco Use  . Smoking status: Never Smoker  . Smokeless tobacco: Never Used  Substance Use Topics  . Alcohol use: Yes    Alcohol/week: 1.0 standard drink    Types: 1 Glasses of wine per week  . Drug use: No     Allergies   Patient has no known allergies.   Review of Systems Review of Systems  Constitutional: Positive for fatigue. Negative for appetite change, chills and fever.  HENT: Negative for congestion, sinus pressure, sore throat, trouble swallowing and voice change.   Eyes: Negative for photophobia, pain, discharge, redness, itching and visual disturbance.  Respiratory: Positive for chest tightness. Negative for cough and shortness of breath.   Cardiovascular: Positive for chest pain. Negative for palpitations and leg swelling.  Gastrointestinal: Negative for abdominal pain, constipation, diarrhea, nausea and vomiting.  Genitourinary: Negative for dysuria, flank pain, frequency and urgency.  Musculoskeletal: Negative for back pain, gait problem, myalgias, neck pain and neck stiffness.  Neurological: Positive for dizziness. Negative for tremors, seizures, syncope, facial asymmetry, speech difficulty, weakness, light-headedness, numbness and headaches.  Psychiatric/Behavioral: Negative for agitation, decreased concentration, dysphoric mood, hallucinations and suicidal ideas. The patient is not nervous/anxious.   All other systems reviewed and are negative.    Physical Exam Triage Vital Signs ED Triage Vitals  Enc Vitals Group      BP 03/27/21 1326 131/75     Pulse Rate 03/27/21 1326 90     Resp 03/27/21 1326 18     Temp 03/27/21 1326 98.6 F (37 C)     Temp Source 03/27/21 1326 Oral     SpO2 03/27/21 1326 100 %     Weight --      Height --      Head Circumference --      Peak Flow --      Pain Score 03/27/21 1329 5     Pain Loc --      Pain Edu? --      Excl. in GC? --    No data found.  Updated Vital Signs BP 131/75 (BP Location: Right Arm)   Pulse 90   Temp 98.6 F (37 C) (Oral)   Resp 18   SpO2  100%   Visual Acuity Right Eye Distance:   Left Eye Distance:   Bilateral Distance:    Right Eye Near:   Left Eye Near:    Bilateral Near:     Physical Exam Vitals reviewed.  Constitutional:      General: She is not in acute distress.    Appearance: Normal appearance. She is not ill-appearing or diaphoretic.  HENT:     Head: Normocephalic and atraumatic.     Mouth/Throat:     Mouth: Mucous membranes are moist.  Eyes:     Extraocular Movements: Extraocular movements intact.     Pupils: Pupils are equal, round, and reactive to light.  Cardiovascular:     Rate and Rhythm: Normal rate and regular rhythm.     Pulses:          Radial pulses are 2+ on the right side and 2+ on the left side.     Heart sounds: Normal heart sounds.  Pulmonary:     Effort: Pulmonary effort is normal.     Breath sounds: Normal breath sounds. No wheezing, rhonchi or rales.  Abdominal:     Palpations: Abdomen is soft.     Tenderness: There is abdominal tenderness in the epigastric area. There is no guarding or rebound.  Musculoskeletal:     Cervical back: Normal range of motion and neck supple. No rigidity.     Right lower leg: No edema.     Left lower leg: No edema.  Lymphadenopathy:     Cervical: No cervical adenopathy.  Skin:    General: Skin is warm.     Capillary Refill: Capillary refill takes less than 2 seconds.  Neurological:     General: No focal deficit present.     Mental Status: She is alert and  oriented to person, place, and time. Mental status is at baseline.     Cranial Nerves: Cranial nerves are intact. No cranial nerve deficit or facial asymmetry.     Sensory: Sensation is intact. No sensory deficit.     Motor: Motor function is intact. No weakness.     Coordination: Coordination is intact. Coordination normal.     Gait: Gait is intact. Gait normal.     Comments: CN 2-12 intact. No weakness or numbness in UEs or LEs.  Psychiatric:        Mood and Affect: Mood normal.        Behavior: Behavior normal.        Thought Content: Thought content normal.        Judgment: Judgment normal.      UC Treatments / Results  Labs (all labs ordered are listed, but only abnormal results are displayed) Labs Reviewed - No data to display  EKG   Radiology No results found.  Procedures Procedures (including critical care time)  Medications Ordered in UC Medications - No data to display  Initial Impression / Assessment and Plan / UC Course  I have reviewed the triage vital signs and the nursing notes.  Pertinent labs & imaging results that were available during my care of the patient were reviewed by me and considered in my medical decision making (see chart for details).      This patient is a 35 year old female presenting with generalized chest pressure intermittently x3 months; new symptoms today of  dizziness, fatigue.  She has been evaluated by the emergency room multiple times for this as well as her primary care provider.  This patient has a diagnosis  of GERD for which she takes Pepcid and Prilosec daily. Was last evaluated in the emergency room on 03/03/21, and at that time she had full work-up including EKG, troponin, lipase, LFTs, CBC, BMP.  All of this was unremarkable. CTA was performed 01/05/21 and was negative.  This patient has an appointment with GI on 4/28. EKG today with t wave changes throughout, though fairly unchanged from 03/03/21 EKG.  I suspect that once again  this patient's chest pain is caused by poorly controlled GERD as well as anxiety.  However I cannot completely rule out cardiac pathology, and given new symptoms of dizziness today, I am recommending she head to the emergency room for further evaluation and management and cardiac work-up.  She verbalizes understanding and agreement.  She is hemodynamically stable for transport in personal vehicle at this time.   Final Clinical Impressions(s) / UC Diagnoses   Final diagnoses:  Atypical chest pain  Gastroesophageal reflux disease without esophagitis     Discharge Instructions     -Head straight to [facility] emergency room for further evaluation and management of chest pain, abdominal pain, and dizziness. -If your symptoms get worse while on the way to the emergency room, stop and call 911.  This includes worsening of dizziness, chest pain, pain in left arm, if you pass out, etc.    ED Prescriptions    None     PDMP not reviewed this encounter.   Rhys Martini, PA-C 03/27/21 1534

## 2021-03-27 NOTE — ED Notes (Signed)
Patient is being discharged from the Urgent Care and sent to the Emergency Department via personal vehicle. Per provider Ignacia Bayley, patient is in need of higher level of care due to chest pain of unknown source. Patient is aware and verbalizes understanding of plan of care.   Vitals:   03/27/21 1326  BP: 131/75  Pulse: 90  Resp: 18  Temp: 98.6 F (37 C)  SpO2: 100%

## 2021-03-27 NOTE — ED Triage Notes (Signed)
Pt c/o constant chest pressure, fatigue and bilateral lower leg pain. Pt c/o shortness of breath. Pt states she has been having indigestion and taking multiple medications.

## 2021-03-27 NOTE — ED Notes (Signed)
States the GI cocktail helped and that her pain level is now a 5

## 2021-03-27 NOTE — ED Provider Notes (Signed)
[facility] EMERGENCY DEPARTMENT Provider Note   CSN: [id number] Arrival date & time: 03/27/21  1532     History Chief Complaint  Patient presents with  . Chest Pain    Katherine Lindsey is a 35 y.o. female with a past medical history significant for anxiety, GERD, and Crohn's disease who presents to the ED from urgent care due to central/right-sided chest pressure that has been intermittent for the past 3 months.  No relation to exertion or change in position.  Patient states chest pain is relieved by belching.  Has a history of GERD and anxiety.  She has been taking Pepcid, Prilosec, Gas-X, Brilinta, and ginger with no relief in symptoms.  Patient denies history of blood clots, recent surgeries or recent long immobilizations, lower extremity edema.  She receives Depo injections for birth control.  Denies associated shortness of breath, diaphoresis, vomiting.  She admits to intermittent nausea.  No tobacco use.  No family history of early CAD.  Patient also admits to "bloating" for the past 3 months.  She has a scheduled GI appointment later this month for further evaluation.  She has a history of a cholecystectomy and 1 C-section. She admits to previous episodes of chest pain in the past which was believed to be related to reflux. Patient also notes fatigue and dizziness this morning which has completely resolved. No treatment prior to arrival. No aggravating or alleviating symptoms.    History obtained from patient and past medical records. No interpreter used during encounter.   HPI: A 35 year old patient presents for evaluation of chest pain. Initial onset of pain was less than one hour ago. The patient's chest pain is described as heaviness/pressure/tightness and is not worse with exertion. The patient complains of nausea. The patient's chest pain is middle- or left-sided, is not well-localized, is not sharp and does not radiate to the arms/jaw/neck. The patient denies  diaphoresis. The patient has no history of stroke, has no history of peripheral artery disease, has not smoked in the past 90 days, denies any history of treated diabetes, has no relevant family history of coronary artery disease (first degree relative at less than age 49), is not hypertensive, has no history of hypercholesterolemia and does not have an elevated BMI (>=30).   Past Medical History:  Diagnosis Date  . Anxiety   . Crohn's disease in remission [facility])    Hasn't had symptoms since 2004  . Gallstones   . Hammertoe 09/21/2014  . History of low transverse cesarean section 11/28/2015  . VBAC, delivered 03/19/2016    Patient Active Problem List   Diagnosis Date Noted  . Functional dyspepsia 03/20/2021  . Weight loss 11/18/2020  . Gastroesophageal reflux disease 08/29/2020  . Nonintractable episodic headache 01/06/2020  . Anxiety 07/15/2019  . Hidradenitis suppurativa 07/15/2019  . High risk HPV infection 02/07/2017  . Vaginal spotting 10/17/2012    Past Surgical History:  Procedure Laterality Date  . CESAREAN SECTION N/A 06/16/2013   Procedure: CESAREAN SECTION;  Surgeon: Reva Bores, MD;  Location: [facility];  Service: Obstetrics;  Laterality: N/A;  . CHOLECYSTECTOMY  08/28/2012   Procedure: LAPAROSCOPIC CHOLECYSTECTOMY WITH INTRAOPERATIVE CHOLANGIOGRAM;  Surgeon: [name], DO;  Location: [facility] OR;  Service: General;  Laterality: N/A;  . INDUCED ABORTION  02/2012     OB History    Gravida  4   Para  3   Term  3   Preterm      AB  1  Living  3     SAB  0   IAB  1   Ectopic      Multiple  0   Live Births  3        Obstetric Comments  2014 LTCS with extension of hysterotomy inferiorly        Family History  Problem Relation Age of Onset  . Other Neg Hx   . Diabetes Neg Hx   . Heart disease Neg Hx   . Hyperlipidemia Neg Hx   . Hypertension Neg Hx   . Stroke Neg Hx     Social History   Tobacco Use  . Smoking status: Never Smoker  . Smokeless  tobacco: Never Used  Substance Use Topics  . Alcohol use: Yes    Alcohol/week: 1.0 standard drink    Types: 1 Glasses of wine per week  . Drug use: No    Home Medications Prior to Admission medications   Medication Sig Start Date End Date Taking? Authorizing Provider  Alum Hydroxide-Mag Carbonate (GAVISCON EXTRA STRENGTH) 160-105 MG CHEW Chew 2-4 tablets by mouth 4 (four) times daily -  with meals and at bedtime. Do not take more than 16 in a day 03/17/21   Shirlean Mylar, MD  escitalopram (LEXAPRO) 10 MG tablet Take 1 tablet (10 mg total) by mouth daily. Patient not taking: No sig reported 12/23/20   Sandre Kitty, MD  famotidine (PEPCID) 20 MG tablet Take 20 mg by mouth daily.    [provider]  medroxyPROGESTERone (DEPO-PROVERA) 150 MG/ML injection Inject 150 mg into the muscle every 3 (three) months.    [provider]  omeprazole (PRILOSEC) 20 MG capsule Take 20 mg by mouth daily as needed (acid reflux).    [provider]  omeprazole (PRILOSEC) 20 MG capsule Take 1 capsule (20 mg total) by mouth daily. 03/03/21 04/02/21  Tanda Rockers, PA-C  promethazine (PHENERGAN) 25 MG tablet Take 1 tablet (25 mg total) by mouth every 8 (eight) hours as needed (headache). Patient not taking: Reported on 07/14/2020 03/28/20 08/16/20  Gilda Crease, MD  SUMAtriptan (IMITREX) 50 MG tablet Take 1 tablet (50 mg total) by mouth every 2 (two) hours as needed for migraine. May repeat in 2 hours if headache persists or recurs. Patient not taking: Reported on 07/14/2020 01/27/20 08/16/20  Gailen Shelter, PA    Allergies    Patient has no known allergies.  Review of Systems   Review of Systems  Constitutional: Negative for chills and fever.  Respiratory: Negative for cough and shortness of breath.   Cardiovascular: Positive for chest pain. Negative for leg swelling.  Gastrointestinal: Positive for abdominal distention (bloating) and nausea. Negative for diarrhea and  vomiting.  Genitourinary: Negative for dysuria and vaginal discharge.  Neurological: Positive for dizziness (resolved).  All other systems reviewed and are negative.   Physical Exam Updated Vital Signs BP 131/82   Pulse 65   Temp 99.1 F (37.3 C)   Resp 18   Ht 5\' 8"  (1.727 m)   Wt 70.3 kg   LMP  (LMP Unknown)   SpO2 100%   BMI 23.57 kg/m   Physical Exam Vitals and nursing note reviewed.  Constitutional:      General: She is not in acute distress.    Appearance: She is not ill-appearing.  HENT:     Head: Normocephalic.  Eyes:     Pupils: Pupils are equal, round, and reactive to light.  Cardiovascular:  Rate and Rhythm: Normal rate and regular rhythm.     Pulses: Normal pulses.     Heart sounds: Normal heart sounds. No murmur heard. No friction rub. No gallop.   Pulmonary:     Effort: Pulmonary effort is normal.     Breath sounds: Normal breath sounds.  Abdominal:     General: Abdomen is flat. There is no distension.     Palpations: Abdomen is soft.     Tenderness: There is abdominal tenderness. There is no guarding or rebound.     Comments: Mild diffuse abdominal tenderness. No focal tenderness. No rebound or guarding.  Musculoskeletal:        General: Normal range of motion.     Cervical back: Neck supple.     Comments: No lower extremity edema. Negative homan sign bilaterally.  Skin:    General: Skin is warm and dry.  Neurological:     General: No focal deficit present.     Mental Status: She is alert.  Psychiatric:        Mood and Affect: Mood normal.        Behavior: Behavior normal.     ED Results / Procedures / Treatments   Labs (all labs ordered are listed, but only abnormal results are displayed) Labs Reviewed  COMPREHENSIVE METABOLIC PANEL - Abnormal; Notable for the following components:      Result Value   Potassium 3.4 (*)    All other components within normal limits  CBC WITH DIFFERENTIAL/PLATELET  LIPASE, BLOOD  I-STAT BETA HCG  BLOOD, ED ([facility], [facility], [facility] ONLY)  TROPONIN I (HIGH SENSITIVITY)  TROPONIN I (HIGH SENSITIVITY)    EKG None  Radiology DG Chest 2 View  Result Date: 03/27/2021 CLINICAL DATA:  Chest pain. EXAM: CHEST - 2 VIEW COMPARISON:  March 03, 2021. FINDINGS: The heart size and mediastinal contours are within normal limits. Both lungs are clear. No pneumothorax or pleural effusion is noted. The visualized skeletal structures are unremarkable. IMPRESSION: No active cardiopulmonary disease. Electronically Signed   By: Lupita Raider M.D.   On: 03/27/2021 16:21    Procedures Procedures   Medications Ordered in ED Medications  alum & mag hydroxide-simeth (MAALOX/MYLANTA) 200-200-20 MG/5ML suspension 30 mL (30 mLs Oral Given 03/27/21 1840)    And  lidocaine (XYLOCAINE) 2 % viscous mouth solution 15 mL (15 mLs Oral Given 03/27/21 1840)    ED Course  I have reviewed the triage vital signs and the nursing notes.  Pertinent labs & imaging results that were available during my care of the patient were reviewed by me and considered in my medical decision making (see chart for details).    MDM Rules/Calculators/A&P HEAR Score: 2                       35 year old female presents to the ED due to chest pain and abdominal bloating that has been intermittent for the past 3 months.  Evaluated by urgent care prior to arrival and sent to the ED to rule out cardiac etiology.  Patient has a scheduled GI appointment later this month. Patient seen a few times in the ED for the same complaint which was thought to be related to reflux.  Upon arrival, vitals all within normal limits.  Patient in no acute stress and non-ill-appearing.  Physical exam significant for diffuse abdominal tenderness.  No focal tenderness.  No rebound or guarding.  Doubt acute abdomen.  Lungs clear to auscultation bilaterally.  Routine labs, troponin, chest x-ray, and EKG ordered.  Low suspicion for PE/DVT given no tachycardia, hypoxia, or clinical  signs of DVT on exam.  Dissection was considered but thought to be less likely given presentation and longevity of symptoms. GI cocktail given.  CBC reassuring with no leukocytosis and normal hemoglobin.  CMP significant for mild hypokalemia 3.4, but otherwise unremarkable.  Normal renal function.  Potassium repleted here in the ED.  Pregnancy test negative.  Initial troponin normal.  Lipase normal.  Doubt pancreatitis.  EKG personally reviewed which demonstrates normal sinus rhythm with nonspecific T wave abnormalities. Patient admits to improvement after GI cocktail. Suspect symptoms could be related to GERD.  Chest x-ray personally reviewed which is negative for signs of pneumonia, pneumothorax, or widened mediastinum.  Patient handed off to Dr. Effie Shy pending delta troponin. If flat, patient may be discharged home with GI follow-up for possible GERD. Cardiology number given at discharge for further evaluation Final Clinical Impression(s) / ED Diagnoses Final diagnoses:  Nonspecific chest pain  Gastroesophageal reflux disease, unspecified whether esophagitis present    Rx / DC Orders ED Discharge Orders    None       Jesusita Oka 03/27/21 1906    Mancel Bale, MD 03/27/21 2059

## 2021-03-30 ENCOUNTER — Ambulatory Visit: Payer: BC Managed Care – PPO

## 2021-03-31 ENCOUNTER — Emergency Department (HOSPITAL_COMMUNITY): Payer: BC Managed Care – PPO

## 2021-03-31 ENCOUNTER — Telehealth: Payer: Self-pay | Admitting: Family Medicine

## 2021-03-31 ENCOUNTER — Encounter: Payer: Self-pay | Admitting: Family Medicine

## 2021-03-31 ENCOUNTER — Encounter (HOSPITAL_COMMUNITY): Payer: Self-pay | Admitting: Pharmacy Technician

## 2021-03-31 ENCOUNTER — Emergency Department (HOSPITAL_COMMUNITY)
Admission: EM | Admit: 2021-03-31 | Discharge: 2021-03-31 | Disposition: A | Discharge status: Home / Self Care | Payer: BC Managed Care – PPO | Attending: Emergency Medicine | Admitting: Emergency Medicine

## 2021-03-31 ENCOUNTER — Other Ambulatory Visit: Payer: Self-pay

## 2021-03-31 DIAGNOSIS — R202 Paresthesia of skin: Secondary | ICD-10-CM | POA: Diagnosis not present

## 2021-03-31 DIAGNOSIS — R079 Chest pain, unspecified: Secondary | ICD-10-CM

## 2021-03-31 DIAGNOSIS — R1013 Epigastric pain: Secondary | ICD-10-CM | POA: Insufficient documentation

## 2021-03-31 DIAGNOSIS — R0789 Other chest pain: Secondary | ICD-10-CM | POA: Diagnosis not present

## 2021-03-31 DIAGNOSIS — R140 Abdominal distension (gaseous): Secondary | ICD-10-CM | POA: Diagnosis not present

## 2021-03-31 DIAGNOSIS — K219 Gastro-esophageal reflux disease without esophagitis: Secondary | ICD-10-CM | POA: Diagnosis not present

## 2021-03-31 DIAGNOSIS — F419 Anxiety disorder, unspecified: Secondary | ICD-10-CM | POA: Insufficient documentation

## 2021-03-31 DIAGNOSIS — R0602 Shortness of breath: Secondary | ICD-10-CM | POA: Diagnosis not present

## 2021-03-31 LAB — D-DIMER, QUANTITATIVE: D-Dimer, Quant: 0.44 ug/mL-FEU (ref 0.00–0.50)

## 2021-03-31 LAB — CBC
HCT: 37.8 % (ref 36.0–46.0)
Hemoglobin: 12.7 g/dL (ref 12.0–15.0)
MCH: 29.7 pg (ref 26.0–34.0)
MCHC: 33.6 g/dL (ref 30.0–36.0)
MCV: 88.3 fL (ref 80.0–100.0)
Platelets: 261 10*3/uL (ref 150–400)
RBC: 4.28 MIL/uL (ref 3.87–5.11)
RDW: 13.4 % (ref 11.5–15.5)
WBC: 6.3 10*3/uL (ref 4.0–10.5)
nRBC: 0 % (ref 0.0–0.2)

## 2021-03-31 LAB — BASIC METABOLIC PANEL
Anion gap: 7 (ref 5–15)
BUN: 7 mg/dL (ref 6–20)
CO2: 23 mmol/L (ref 22–32)
Calcium: 9.4 mg/dL (ref 8.9–10.3)
Chloride: 107 mmol/L (ref 98–111)
Creatinine, Ser: 0.69 mg/dL (ref 0.44–1.00)
GFR, Estimated: >60 mL/min (ref >60)
Glucose, Bld: 89 mg/dL (ref 70–99)
Potassium: 3.6 mmol/L (ref 3.5–5.1)
Sodium: 137 mmol/L (ref 135–145)

## 2021-03-31 LAB — I-STAT BETA HCG BLOOD, ED (MC, WL, AP ONLY): I-stat hCG, quantitative: <5 m[IU]/mL (ref <5)

## 2021-03-31 LAB — TROPONIN I (HIGH SENSITIVITY): Troponin I (High Sensitivity): <2 ng/L (ref <18)

## 2021-03-31 MED ORDER — HYDROXYZINE HCL 25 MG PO TABS: 25.0000 mg | ORAL_TABLET | Freq: Three times a day (TID) | ORAL | 0 refills | Status: DC | PRN

## 2021-03-31 NOTE — Discharge Instructions (Signed)
Your work-up today was reassuring.  Please go to your appointment with your primary care provider next week, as scheduled.  It is important that you discuss your recent ER encounters and ongoing strategies for management of anxiety.  You have been prescribed hydroxyzine.  Please take as needed for anxiety symptoms.  Continue to take your other regularly prescribed occasions, particularly those ones for bloating and heartburn.  Return to the ED or seek immediate medical attention should you experience any new or worsening symptoms.

## 2021-03-31 NOTE — Telephone Encounter (Signed)
*  AFTER HOURS CALL*  Patient calls reporting left arm pain, chest pain, neck, jaw and head pain. It is only on her left side. She also reports pain in the middle of her chest and has been having pressure. She reports that she was evaluated in the ED on Monday and was told it seemed to be more gastric related. She reports that she continues to feel pain in her chest that is affecting her ability to do her day-to-day activities. She reports that the left sided pains started yesterday where as before it was just the chest pressure.  She reports that the chest pains are getting more severe and now has progressed to even including foot pain with tingling in feet and legs. She reports having some nausea. She has not had any emesis. She has not had any new surgeries or new medications since onset of pains. She denies new vitamins. Chest pressure is centralized.  Patient reports she has been taking pepcid, GasEx and omeprazole and eating an appropriate diet but nothing seems to help her pains.  She often has SOB with exertion.   Patient continues to report pain during call as well as increasing difficulty breathing. Given worsening of symptoms, recommended that patient be evaluated for respiratory concerns and worsening pains.   Reviewed patient's chart with normal troponin levels. She has appt scheduled with GI for dyspepsia.  Made Access to Care Appt for Wed 4/20 at 9:50 AM.  Patient to be evaluated today in ED.   Ronnald Ramp, MD  [facility] Service, PGY-2  FPTS Intern Pager [phone]

## 2021-03-31 NOTE — ED Provider Notes (Signed)
[facility] EMERGENCY DEPARTMENT Provider Note   CSN: [id number] Arrival date & time: 03/31/21  1043     History Chief Complaint  Patient presents with  . Arm Pain  . Chest Pain    Katherine Lindsey is a 35 y.o. female with past medical history of anxiety and functional dyspepsia who presents to the ED with complaints of chest pain and left arm tingling paresthesias.  I reviewed patient's medical record and she has been seen in the ED for similar presentations several times this year, as recently as 4 days ago.  During that encounter, it was noted her symptoms improved with belching and she also complained of abdominal bloating symptoms.  She already has an appointment scheduled with gastroenterology later this month.  Chest pain work-up was entirely unremarkable.  Improved with GI cocktail.  She was also seen in triage by another provider who ordered repeat cardiac work-up as well as D-dimer.  On my examination, patient reports that for the past couple of days she has been having tingling sensation in her left arm that radiates towards her head and chest.  She states that she is continuing to endorse the central chest pressure discomfort and GI complaints, as well.  She feels as though she has shooting pains down into her feet.  Patient notes that she was researching online and suspects that perhaps she has a clot somewhere.  While she has had repeat negative testing here in the ED, she states that the tingling sensation is new which prompted her to come to the ED for evaluation.  Patient continues to experience epigastric bloating and discomfort that improves with burping.  She is on a proton pump inhibitor and H2 blocker.  She also takes Mylanta.  She states that she has been improving her diet.  She has been doing all the things she can to mitigate symptoms, without any improvement.  For this reason, she suspects that there is a more nefarious process involved.  I  asked her if she has been in contact with her primary care provider and she states that she actually called them today and they subsequently referred her here.  I inquired about history of anxiety and she states that she used to have it and take medications, but no longer does.  She states that she discontinued Lexapro 11/2020.  She does not believe her symptoms here are related to anxiety because she states that she has not been as stressed recently.  However, she also tells me that she has been stressed regarding the financial impact of multiple ER encounters over the course of the past few months.  No family history of premature cardiac death.  No personal history of exertional syncope.  HPI     Past Medical History:  Diagnosis Date  . Anxiety   . Crohn's disease in remission [facility])    Hasn't had symptoms since 2004  . Gallstones   . Hammertoe 09/21/2014  . History of low transverse cesarean section 11/28/2015  . VBAC, delivered 03/19/2016    Patient Active Problem List   Diagnosis Date Noted  . Functional dyspepsia 03/20/2021  . Weight loss 11/18/2020  . Gastroesophageal reflux disease 08/29/2020  . Nonintractable episodic headache 01/06/2020  . Anxiety 07/15/2019  . Hidradenitis suppurativa 07/15/2019  . High risk HPV infection 02/07/2017  . Vaginal spotting 10/17/2012    Past Surgical History:  Procedure Laterality Date  . CESAREAN SECTION N/A 06/16/2013   Procedure: CESAREAN SECTION;  Surgeon: Kenney Houseman  Tawni Levy, MD;  Location: [facility] ORS;  Service: Obstetrics;  Laterality: N/A;  . CHOLECYSTECTOMY  08/28/2012   Procedure: LAPAROSCOPIC CHOLECYSTECTOMY WITH INTRAOPERATIVE CHOLANGIOGRAM;  Surgeon: [name], DO;  Location: [facility] OR;  Service: General;  Laterality: N/A;  . INDUCED ABORTION  02/2012     OB History    Gravida  4   Para  3   Term  3   Preterm      AB  1   Living  3     SAB  0   IAB  1   Ectopic      Multiple  0   Live Births  3        Obstetric Comments   2014 LTCS with extension of hysterotomy inferiorly        Family History  Problem Relation Age of Onset  . Other Neg Hx   . Diabetes Neg Hx   . Heart disease Neg Hx   . Hyperlipidemia Neg Hx   . Hypertension Neg Hx   . Stroke Neg Hx     Social History   Tobacco Use  . Smoking status: Never Smoker  . Smokeless tobacco: Never Used  Substance Use Topics  . Alcohol use: Yes    Alcohol/week: 1.0 standard drink    Types: 1 Glasses of wine per week  . Drug use: No    Home Medications Prior to Admission medications   Medication Sig Start Date End Date Taking? Authorizing Provider  hydrOXYzine (ATARAX/VISTARIL) 25 MG tablet Take 1 tablet (25 mg total) by mouth every 8 (eight) hours as needed for anxiety. 03/31/21  Yes Lorelee New, PA-C  Alum Hydroxide-Mag Carbonate (GAVISCON EXTRA STRENGTH) 160-105 MG CHEW Chew 2-4 tablets by mouth 4 (four) times daily -  with meals and at bedtime. Do not take more than 16 in a day 03/17/21   Shirlean Mylar, MD  escitalopram (LEXAPRO) 10 MG tablet Take 1 tablet (10 mg total) by mouth daily. Patient not taking: No sig reported 12/23/20   Sandre Kitty, MD  famotidine (PEPCID) 20 MG tablet Take 20 mg by mouth daily.    [provider]  medroxyPROGESTERone (DEPO-PROVERA) 150 MG/ML injection Inject 150 mg into the muscle every 3 (three) months.    [provider]  omeprazole (PRILOSEC) 20 MG capsule Take 20 mg by mouth daily as needed (acid reflux).    [provider]  omeprazole (PRILOSEC) 20 MG capsule Take 1 capsule (20 mg total) by mouth daily. 03/03/21 04/02/21  Tanda Rockers, PA-C  promethazine (PHENERGAN) 25 MG tablet Take 1 tablet (25 mg total) by mouth every 8 (eight) hours as needed (headache). Patient not taking: Reported on 07/14/2020 03/28/20 08/16/20  Gilda Crease, MD  SUMAtriptan (IMITREX) 50 MG tablet Take 1 tablet (50 mg total) by mouth every 2 (two) hours as needed for migraine. May repeat in 2  hours if headache persists or recurs. Patient not taking: Reported on 07/14/2020 01/27/20 08/16/20  Gailen Shelter, PA    Allergies    Patient has no known allergies.  Review of Systems   Review of Systems  All other systems reviewed and are negative.   Physical Exam Updated Vital Signs BP 123/72 (BP Location: Left Arm)   Pulse 65   Temp 99.3 F (37.4 C) (Oral)   Resp 17   LMP  (LMP Unknown)   SpO2 100%   Physical Exam Vitals and nursing note reviewed. Exam conducted with a chaperone present.  Constitutional:      General: She is not in acute distress.    Appearance: Normal appearance. She is not toxic-appearing.  HENT:     Head: Normocephalic and atraumatic.  Eyes:     General: No scleral icterus.    Conjunctiva/sclera: Conjunctivae normal.  Cardiovascular:     Rate and Rhythm: Normal rate and regular rhythm.     Pulses: Normal pulses.  Pulmonary:     Effort: Pulmonary effort is normal. No respiratory distress.     Comments: CTA bilaterally.  No increased work of breathing. Abdominal:     General: Abdomen is flat. There is no distension.     Palpations: Abdomen is soft. There is no mass.     Tenderness: There is no abdominal tenderness.     Hernia: No hernia is present.  Musculoskeletal:     Cervical back: Normal range of motion. No rigidity.     Right lower leg: No edema.     Left lower leg: No edema.  Skin:    General: Skin is dry.  Neurological:     General: No focal deficit present.     Mental Status: She is alert and oriented to person, place, and time.     GCS: GCS eye subscore is 4. GCS verbal subscore is 5. GCS motor subscore is 6.     Cranial Nerves: No cranial nerve deficit.     Motor: No weakness.     Coordination: Coordination normal.  Psychiatric:        Behavior: Behavior normal.        Thought Content: Thought content normal.     Comments: Teary-eyed.  Frustrated.     ED Results / Procedures / Treatments   Labs (all labs ordered are  listed, but only abnormal results are displayed) Labs Reviewed  BASIC METABOLIC PANEL  CBC  D-DIMER, QUANTITATIVE  I-STAT BETA HCG BLOOD, ED ([facility], [facility], [facility] ONLY)  TROPONIN I (HIGH SENSITIVITY)    EKG None  Radiology DG Chest 2 View  Result Date: 03/31/2021 CLINICAL DATA:  35 year old female with chest pain under the left breast and shortness of breath since 0100 hours. EXAM: CHEST - 2 VIEW COMPARISON:  Chest radiographs 03/27/2021 and earlier. FINDINGS: Stable mild scoliosis. Lung volumes and mediastinal contours remain normal. Lung markings appear stable, both lungs appear clear. No pneumothorax or pleural effusion. Stable cholecystectomy clips. Negative visible bowel gas pattern. No acute osseous abnormality identified. IMPRESSION: Negative, no cardiopulmonary abnormality.  Mild scoliosis. Electronically Signed   By: Odessa Fleming M.D.   On: 03/31/2021 11:50    Procedures Procedures   Medications Ordered in ED Medications - No data to display  ED Course  I have reviewed the triage vital signs and the nursing notes.  Pertinent labs & imaging results that were available during my care of the patient were reviewed by me and considered in my medical decision making (see chart for details).    MDM Rules/Calculators/A&P                            BARBIE GOVERT was evaluated in Emergency Department on 03/31/2021 for the symptoms described in the history of present illness. She was evaluated in the context of the global COVID-19 pandemic, which necessitated consideration that the patient might be at risk for infection with the SARS-CoV-2 virus that causes COVID-19. Institutional protocols and algorithms that pertain to the evaluation of patients at risk for COVID-19  are in a state of rapid change based on information released by regulatory bodies including the CDC and federal and state organizations. These policies and algorithms were followed during the patient's care in the ED.  I  personally reviewed patient's medical chart and all notes from triage and staff during today's encounter. I have also ordered and reviewed all labs and imaging that I felt to be medically necessary in the evaluation of this patient's complaints and with consideration of their physical exam. If needed, translation services were available and utilized.   Patient with multiple complaints, suspect anxiety.  She endorses left arm tingling radiating towards her head and into her chest with associated central chest "pressure" for which she has been worked up on numerous occasions.  She also states that she is having intermittent shooting pains into her legs in addition to crampy abdominal discomfort and bloating symptoms.  She has been researching online and was concerned for an arterial blockage.  She has been taking all of her medications at home, without any relief.  She has appointment with gastroenterology and is hoping to have answers there.  She is frustrated because she keeps coming to the ER with negative work-ups.  I emphasized the importance of outpatient management.  Recommended restarting SSRIs.  In the interim, will discharge patient home with a course of hydroxyzine for to take as needed as an abortive therapy for anxiety symptoms.  She has an appoint with her primary care provider as well as therapist next week.  Gastroenterology is before the end of the month.  EKG with normal sinus rhythm.  Laboratory work-up obtained and reviewed, all unremarkable.  Troponin is negative, lowering concern for NSTEMI.  Patient has a low Heart Score which lowers suspicion for ACS.  I have low suspicion for dissection given normal pulses in extremities and normal mediastinum on CXR.  No evidence of pneumothorax or consolidation concerning for pneumonia.  There is also no pleural effusion or history suggestive of possible esophageal rupture.  Patient is PERC negative, negative D-Dimer.  I discussed the patient the exact  etiology of their chest pain is unclear and warrants follow up with a primary care provider.    Final Clinical Impression(s) / ED Diagnoses Final diagnoses:  Nonspecific chest pain  Anxiety    Rx / DC Orders ED Discharge Orders         Ordered    hydrOXYzine (ATARAX/VISTARIL) 25 MG tablet  Every 8 hours PRN        03/31/21 1312           Elvera Maria 03/31/21 1313    Cheryll Cockayne, MD 03/31/21 1345

## 2021-03-31 NOTE — ED Triage Notes (Signed)
Emergency Medicine Provider Triage Evaluation Note  Katherine Lindsey , a 35 y.o. female  was evaluated in triage.  Pt complains of substernal chest pain intermittent for several months. Pt now complaining of a "tingling" sensation starting in her left wrist and radiating up into the left shoulder and into her head which is new for her. Has been seen in the ED several times for chest pain - most recently on 04/11 with negative work up. Thought to be related to GI issues with plans to see GI soon.   Review of Systems  Positive: + chest pain, + tingling in left arm Negative: - SOB, - weakness/numbness  Physical Exam  BP 123/72 (BP Location: Left Arm)   Pulse 65   Temp 99.3 F (37.4 C) (Oral)   Resp 17   LMP  (LMP Unknown)   SpO2 100%  Gen:   Awake, no distress   HEENT:  Atraumatic  Resp:  Normal effort  Cardiac:  Normal rate  Abd:   Nondistended, nontender  MSK:   Moves extremities without difficulty  Neuro:  Speech clear. Equal grip strength bilaterally. Equal pulses  Bilaterally radial and DP.   Medical Decision Making  Medically screening exam initiated at 11:14 AM.  Appropriate orders placed.  ALLANNA BRESEE was informed that the remainder of the evaluation will be completed by another provider, this initial triage assessment does not replace that evaluation, and the importance of remaining in the ED until their evaluation is complete.  Clinical Impression  35 year old female presenting to the ED with new onset left arm tingling with ongoing chest pain for several months. On arrival to the ED VSS. Pt appears to be in NAD. She was recently seen in the ED on 04/11 for same with negative work up. Neuro exam reassuring at this time. Low suspicion for dissection. Pt is PERC negative however given she did not have a dimer last time in the ED will add on dimer today. Question GI issues vs anxiety vs ACS (lower suspicion). EKG with NSR and no acute ischemic changes at this time.     Tanda Rockers, PA-C 03/31/21 1118

## 2021-03-31 NOTE — ED Triage Notes (Signed)
Pt here via pov with reports of intermittent chest pressure for a few months. Pt also endorses L arm pain radiating to her neck and jaw onset this morning.

## 2021-04-03 ENCOUNTER — Telehealth: Payer: Self-pay | Admitting: Family Medicine

## 2021-04-04 NOTE — Patient Instructions (Signed)
Thank you for coming in to see [place] today! Please see below to review our plan for today's visit:  1. We are checking H. Pylori to see if this is a cause of your discomfort.  2. Please follow up with GI, you can see either me or Dr. Constance Goltz here in the clinic.  3. Omeprazole 40mg  twice daily.   Please call the clinic at [phone] if your symptoms worsen or you have any concerns. It was our pleasure to serve you!   Dr. [phone] [facility]

## 2021-04-04 NOTE — Progress Notes (Signed)
SUBJECTIVE:   CHIEF COMPLAINT / HPI:    Left-sided Abdominal and Chest pain: Patient presents to clinic reporting fullness in her epigastric area and bloating in her abdomen.  She reports often having gassiness in her chest.  Sometimes she will drink water which relieves the gassiness, but it feels as if "my chest feels back up with gassiness and it hurts, but I feel better with burping."  She feels that her symptoms are worse at night, it makes her anxious and creates a pressure in her chest which worsens her anxiety.  She reports her pain does not change with eating; however, reports worsened gassiness that she needs fried foods, breaded things.  She denies any issues with milk products such as yogurt, butter, cheese.  She says she does not drink milk.  She denies diarrhea or blood in her stool.  She has stools every other day.  Patient has a history of Crohn's disease for which she was a teenager; however, has not been on any medication for Crohn's then.  She says when she had issues with Crohn's it was her lower abdomen, was very painful, she had weight loss and bloody diarrhea.  She reports her current symptoms do not The symptoms she had from Crohn's. Patient has a history of functional dyspepsia and GERD. She is currently treated with Pepcid 20mg  daily, Prilosec 20mg  daily, and Aluminum Hydroxide-Mag Carbonate 2-4 soft chews 4 times daily.    PERTINENT  PMH / PSH:  Patient Active Problem List   Diagnosis Date Noted  . Gassiness 04/07/2021  . Functional dyspepsia 03/20/2021  . Weight loss 11/18/2020  . Gastroesophageal reflux disease 08/29/2020  . Nonintractable episodic headache 01/06/2020  . Anxiety 07/15/2019  . Hidradenitis suppurativa 07/15/2019  . High risk HPV infection 02/07/2017  . Vaginal spotting 10/17/2012     OBJECTIVE:   BP 123/70   Pulse 66   Ht 5\' 8"  (1.727 m)   Wt 154 lb 12.8 oz (70.2 kg)   LMP  (LMP Unknown)   SpO2 99%   BMI 23.54 kg/m    Physical  exam: General: No acute distress, pleasant patient Respiratory: comfortable work of breathing on room air  ASSESSMENT/PLAN:   Functional dyspepsia Pleasant patient presenting again with concerns for left-sided chest/abdominal pain, epigastric discomfort and fullness.  Patient reports of gassiness and bloating most consistent with dyspepsia.  Patient currently taking Prilosec 20 mg daily and famotidine 20 mg daily.  Patient's previous work-up negative for cardiac cause of discomfort thyroid within normal limits, no anemia, no evidence of DVT, no cough/shortness of breath to suggest pulmonary cause.  Patient with history of Crohn's, says her current symptoms do not mimic her Crohn's.  No weight loss identified, no bloody stools/diarrhea to be concern for bleeding ulcer, Crohn's.  Patient denies worsening/improving symptoms of her abdominal pain eating, lower suspicion for gastric versus duodenal ulcer.  Patient could be having symptoms of gastritis with feelings of fullness and discomfort in the epigastric region; however, this is usually described as burning sensation.  I do believe that her anxiety is playing a role in her current symptoms as patient reports this wakes her up at night makes her anxious to believe she would benefit from being SSRI.  Patient initially scheduled to see GI outpatient on 4/7; however, due to scheduling issues with GI provider patient's appointment had to be pushed back.  Benign abdominal exam, no concern for acute abdomen at this time. -We will check H. pylori today to  rule out this is possible cause of her pain/discomfort; however, patient has been taking Prilosec.  Results may be skewed. -Patient to follow-up with GI at her upcoming appointment, afterwards can follow-up with either myself, Dr. Leary Roca or Dr. Constance Goltz -We will increase patient's omeprazole to 40 mg twice daily as treatment for possible gastritis     Dollene Cleveland, DO [facility]

## 2021-04-05 ENCOUNTER — Ambulatory Visit (INDEPENDENT_AMBULATORY_CARE_PROVIDER_SITE_OTHER): Payer: BC Managed Care – PPO | Admitting: Family Medicine

## 2021-04-05 ENCOUNTER — Other Ambulatory Visit: Payer: Self-pay

## 2021-04-05 VITALS — BP 123/70 | HR 66 | Ht 68.0 in | Wt 154.8 lb

## 2021-04-05 DIAGNOSIS — K30 Functional dyspepsia: Secondary | ICD-10-CM

## 2021-04-05 DIAGNOSIS — R140 Abdominal distension (gaseous): Secondary | ICD-10-CM

## 2021-04-05 MED ORDER — OMEPRAZOLE 40 MG PO CPDR: 40.0000 mg | DELAYED_RELEASE_CAPSULE | Freq: Every day | ORAL | 2 refills | Status: DC

## 2021-04-07 DIAGNOSIS — R140 Abdominal distension (gaseous): Secondary | ICD-10-CM | POA: Insufficient documentation

## 2021-04-07 LAB — H. PYLORI BREATH TEST: H pylori Breath Test: NEGATIVE

## 2021-04-07 NOTE — Assessment & Plan Note (Signed)
Pleasant patient presenting again with concerns for left-sided chest/abdominal pain, epigastric discomfort and fullness.  Patient reports of gassiness and bloating most consistent with dyspepsia.  Patient currently taking Prilosec 20 mg daily and famotidine 20 mg daily.  Patient's previous work-up negative for cardiac cause of discomfort thyroid within normal limits, no anemia, no evidence of DVT, no cough/shortness of breath to suggest pulmonary cause.  Patient with history of Crohn's, says her current symptoms do not mimic her Crohn's.  No weight loss identified, no bloody stools/diarrhea to be concern for bleeding ulcer, Crohn's.  Patient denies worsening/improving symptoms of her abdominal pain eating, lower suspicion for gastric versus duodenal ulcer.  Patient could be having symptoms of gastritis with feelings of fullness and discomfort in the epigastric region; however, this is usually described as burning sensation.  I do believe that her anxiety is playing a role in her current symptoms as patient reports this wakes her up at night makes her anxious to believe she would benefit from being SSRI.  Patient initially scheduled to see GI outpatient on 4/7; however, due to scheduling issues with GI provider patient's appointment had to be pushed back.  Benign abdominal exam, no concern for acute abdomen at this time. -We will check H. pylori today to rule out this is possible cause of her pain/discomfort; however, patient has been taking Prilosec.  Results may be skewed. -Patient to follow-up with GI at her upcoming appointment, afterwards can follow-up with either myself, Dr. Leary Roca or Dr. Constance Goltz -We will increase patient's omeprazole to 40 mg twice daily as treatment for possible gastritis

## 2021-04-11 NOTE — Telephone Encounter (Signed)
error 

## 2021-04-13 ENCOUNTER — Ambulatory Visit (INDEPENDENT_AMBULATORY_CARE_PROVIDER_SITE_OTHER): Payer: BC Managed Care – PPO | Admitting: Gastroenterology

## 2021-04-13 ENCOUNTER — Encounter: Payer: Self-pay | Admitting: Gastroenterology

## 2021-04-13 VITALS — BP 100/68 | HR 80 | Ht 68.0 in | Wt 156.4 lb

## 2021-04-13 DIAGNOSIS — K219 Gastro-esophageal reflux disease without esophagitis: Secondary | ICD-10-CM

## 2021-04-13 DIAGNOSIS — R0789 Other chest pain: Secondary | ICD-10-CM | POA: Diagnosis not present

## 2021-04-13 DIAGNOSIS — R142 Eructation: Secondary | ICD-10-CM | POA: Diagnosis not present

## 2021-04-13 NOTE — Progress Notes (Addendum)
04/13/2021 Katherine Lindsey [id number] [birth year]   HISTORY OF PRESENT ILLNESS:  This is a 35 year old female who is new to our office.  She presents here today at the request of Dr. Deirdre Lindsey for evaluation of GERD. She tells me that since September she has been having issues with belching and chest pain.  Says that the pain feels like pressues with gas/air in her chest.  Has been to the ED multiple times and has had many EKGs, chest x-rays, labs including troponins, and a chest CT that have all been negative.  She is on omeprazole 40 mg daily and pepcid 20 mg daily right now.  She says that she's had issues with anxiety as well and was put on lexapro but that seemed to make her feel worse.  She denies any dysphagia.    She tells me that when she was in 10th grade she saw GI in [place] and had several EGDs and colonoscopies.  She says that she was diagnosed with Crohn's disease and was on medication for a little while but then she stopped the medication and felt better so she ended up having several other procedures and says that they no longer found any evidence of Crohn's disease.  She says that she has not experienced any other issues since that time.   Past Medical History:  Diagnosis Date  . Anxiety   . Crohn's disease in remission [facility])    Hasn't had symptoms since 2004  . Gallstones   . Hammertoe 09/21/2014  . History of low transverse cesarean section 11/28/2015  . VBAC, delivered 03/19/2016   Past Surgical History:  Procedure Laterality Date  . CESAREAN SECTION N/A 06/16/2013   Procedure: CESAREAN SECTION;  Surgeon: Katherine Bores, MD;  Location: [facility];  Service: Obstetrics;  Laterality: N/A;  . CHOLECYSTECTOMY  08/28/2012   Procedure: LAPAROSCOPIC CHOLECYSTECTOMY WITH INTRAOPERATIVE CHOLANGIOGRAM;  Surgeon: Katherine Pilot, DO;  Location: [facility] OR;  Service: General;  Laterality: N/A;  . INDUCED ABORTION  02/2012    reports that she has never smoked. She has never used  smokeless tobacco. She reports current alcohol use of about 1.0 standard drink of alcohol per week. She reports that she does not use drugs. family history is not on file. No Known Allergies    Outpatient Encounter Medications as of 04/13/2021  Medication Sig  . famotidine (PEPCID) 20 MG tablet Take 20 mg by mouth daily.  . medroxyPROGESTERone (DEPO-PROVERA) 150 MG/ML injection Inject 150 mg into the muscle every 3 (three) months.  Marland Kitchen omeprazole (PRILOSEC) 40 MG capsule Take 1 capsule (40 mg total) by mouth daily.  . [DISCONTINUED] Alum Hydroxide-Mag Carbonate (GAVISCON EXTRA STRENGTH) 160-105 MG CHEW Chew 2-4 tablets by mouth 4 (four) times daily -  with meals and at bedtime. Do not take more than 16 in a day (Patient not taking: Reported on 04/13/2021)  . [DISCONTINUED] hydrOXYzine (ATARAX/VISTARIL) 25 MG tablet Take 1 tablet (25 mg total) by mouth every 8 (eight) hours as needed for anxiety. (Patient not taking: Reported on 04/13/2021)  . [DISCONTINUED] promethazine (PHENERGAN) 25 MG tablet Take 1 tablet (25 mg total) by mouth every 8 (eight) hours as needed (headache). (Patient not taking: Reported on 07/14/2020)  . [DISCONTINUED] SUMAtriptan (IMITREX) 50 MG tablet Take 1 tablet (50 mg total) by mouth every 2 (two) hours as needed for migraine. May repeat in 2 hours if headache persists or recurs. (Patient not taking: Reported on 07/14/2020)   No  facility-administered encounter medications on file as of 04/13/2021.     REVIEW OF SYSTEMS  : All other systems reviewed and negative except where noted in the History of Present Illness.   PHYSICAL EXAM: BP 100/68 (BP Location: Left Arm, Patient Position: Sitting, Cuff Size: Normal)   Pulse 80   Ht 5\' 8"  (1.727 m)   Wt 156 lb 6 oz (70.9 kg)   LMP  (LMP Unknown)   BMI 23.78 kg/m  General: Well developed AA female in no acute distress Head: Normocephalic and atraumatic Eyes:  Sclerae anicteric, conjunctiva pink. Ears: Normal auditory  acuity Lungs: Clear throughout to auscultation; no W/R/R. Heart: Regular rate and rhythm; no M/R/G. Abdomen: Soft, non-distended.  BS present.  Non-tender. Musculoskeletal: Symmetrical with no gross deformities  Skin: No lesions on visible extremities Extremities: No edema  Neurological: Alert oriented x 4, grossly non-focal Psychological:  Alert and cooperative. Normal mood and affect  ASSESSMENT AND PLAN: *Belching, atypical chest pain, GERD:  Patient having symptoms for the past several months.  EKGs, chest x-rays, a chest CT, and troponins have been negative on several occasions.  ? Component of anxiety as well, but she states that Lexapro made her feel worse.  She has been on omeprazole 20 mg daily and pepcid 20 mg daily.  PCP recently sent a prescription for omeprazole 40 mg daily.  She will start the increase dosing and continue pepcid once or twice daily as well.  Will plan for EGD with Dr. .  The risks, benefits, and alternatives to EGD were discussed with the patient and she consents to proceed.  Will give samples of FDgard to try for gas/belching in the interim as well.  **Will request previous GI records from [facility] in [place] from when she was in high school as she reports having several EGDs and colonoscopies at that time.  **Some records were received.  Colonoscopy from August 2006 showed Crohn's disease that was vastly improved prior to previous study with only distal rectal and perianal disease noted but no fistula.  No other studies were sent.  Looks like she had been on Asacol and prednisone and the plan was to start 6-MP 50 mg daily back then.  CC:  September 2006, MD

## 2021-04-13 NOTE — Patient Instructions (Addendum)
If you are age 34 or older, your body mass index should be between 23-30. Your Body mass index is 23.78 kg/m. If this is out of the aforementioned range listed, please consider follow up with your Primary Care Provider.  If you are age 80 or younger, your body mass index should be between 19-25. Your Body mass index is 23.78 kg/m. If this is out of the aformentioned range listed, please consider follow up with your Primary Care Provider.   You have been scheduled for an endoscopy. Please follow written instructions given to you at your visit today. If you use inhalers (even only as needed), please bring them with you on the day of your procedure.  Please start FD guard.You may start gaviscon or gas-x for gas and belching as well.  Due to recent changes in healthcare laws, you may see the results of your imaging and laboratory studies on MyChart before your provider has had a chance to review them.  We understand that in some cases there may be results that are confusing or concerning to you. Not all laboratory results come back in the same time frame and the provider may be waiting for multiple results in order to interpret others.  Please give [place] 48 hours in order for your provider to thoroughly review all the results before contacting the office for clarification of your results.   Thank you for choosing me and [facility].  Doug Sou, PA-C

## 2021-04-13 NOTE — Progress Notes (Signed)
Reviewed and agree with management plan.  Izick Gasbarro T. Decari Duggar, MD FACG [phone]  

## 2021-04-18 ENCOUNTER — Ambulatory Visit: Payer: BC Managed Care – PPO | Admitting: Family Medicine

## 2021-04-18 NOTE — Progress Notes (Deleted)
    SUBJECTIVE:   CHIEF COMPLAINT / HPI:   Dyspepsia: omperazole 40mg  daily w/ pepcid once or twice daily.  FDgard samples  PERTINENT  PMH / PSH: ***  OBJECTIVE:   LMP  (LMP Unknown)   ***  ASSESSMENT/PLAN:   No problem-specific Assessment & Plan notes found for this encounter.     , MD [place] [facility]   {    This will disappear when note is signed, click to select method of visit    :1}

## 2021-05-05 ENCOUNTER — Telehealth: Payer: Self-pay

## 2021-05-05 ENCOUNTER — Ambulatory Visit (INDEPENDENT_AMBULATORY_CARE_PROVIDER_SITE_OTHER): Payer: BC Managed Care – PPO | Admitting: Family Medicine

## 2021-05-05 ENCOUNTER — Other Ambulatory Visit: Payer: Self-pay

## 2021-05-05 ENCOUNTER — Encounter: Payer: Self-pay | Admitting: Family Medicine

## 2021-05-05 VITALS — BP 116/60 | HR 61 | Ht 68.0 in | Wt 157.4 lb

## 2021-05-05 DIAGNOSIS — R202 Paresthesia of skin: Secondary | ICD-10-CM | POA: Diagnosis not present

## 2021-05-05 DIAGNOSIS — R200 Anesthesia of skin: Secondary | ICD-10-CM

## 2021-05-05 NOTE — Telephone Encounter (Signed)
Patient calls nurse line reporting she has noticed some "numbess" around her face, neck, and left arm. Patient reports she has chest pressure, however this is normal for her when she has indigestion. Patient denies headaches, SOB, or NV. Patient scheduled for this afternoon for evaluation. ED precautions given until then.

## 2021-05-06 LAB — COMPREHENSIVE METABOLIC PANEL
ALT: 12 IU/L (ref 0–32)
AST: 16 IU/L (ref 0–40)
Albumin/Globulin Ratio: 1.9 (ref 1.2–2.2)
Albumin: 5 g/dL — ABNORMAL HIGH (ref 3.8–4.8)
Alkaline Phosphatase: 82 IU/L (ref 44–121)
BUN/Creatinine Ratio: 10 (ref 9–23)
BUN: 8 mg/dL (ref 6–20)
Bilirubin Total: 0.6 mg/dL (ref 0.0–1.2)
CO2: 20 mmol/L (ref 20–29)
Calcium: 9.7 mg/dL (ref 8.7–10.2)
Chloride: 102 mmol/L (ref 96–106)
Creatinine, Ser: 0.78 mg/dL (ref 0.57–1.00)
Globulin, Total: 2.7 g/dL (ref 1.5–4.5)
Glucose: 83 mg/dL (ref 65–99)
Potassium: 3.9 mmol/L (ref 3.5–5.2)
Sodium: 136 mmol/L (ref 134–144)
Total Protein: 7.7 g/dL (ref 6.0–8.5)
eGFR: 102 mL/min/{1.73_m2} (ref >59)

## 2021-05-06 LAB — VITAMIN B12: Vitamin B-12: 489 pg/mL (ref 232–1245)

## 2021-05-06 LAB — FOLATE: Folate: 7 ng/mL (ref >3.0)

## 2021-05-09 ENCOUNTER — Encounter: Payer: Self-pay | Admitting: Family Medicine

## 2021-05-09 DIAGNOSIS — R200 Anesthesia of skin: Secondary | ICD-10-CM | POA: Insufficient documentation

## 2021-05-09 DIAGNOSIS — R202 Paresthesia of skin: Secondary | ICD-10-CM | POA: Insufficient documentation

## 2021-05-09 NOTE — Assessment & Plan Note (Signed)
Patient with numbness in her bilateral upper extremities that started yesterday 05/04/2021.  No injury/trauma noted that started patient's symptoms.  Low concern for vitamin deficiency at this time but will check.  Patient not taking any medications known to cause this symptom.  Patient's upper extremities neurovascularly intact -We will check CMP, vitamin B-12 and vitamin B 9

## 2021-05-09 NOTE — Progress Notes (Signed)
    SUBJECTIVE:   CHIEF COMPLAINT / HPI:   Numbness of hands: Patient presents to clinic today with concerns for 1 day of numbness/tingling in her bilateral hands that has made its way up her arms to her chest, and patient feels as if it is making its way up her neck.  She has never had this sensation before.  Denies any accidents/traumas that could be contributing to it.  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  No history of diabetes with peripheral neuropathy.  Patient is not vegetarian/vegan, she is open minded to checking vitamins at today's visit.  She denies any fevers, shortness of breath, cough, body aches, chills.  PERTINENT  PMH / PSH:  Patient Active Problem List   Diagnosis Date Noted  . Numbness and tingling in both hands 05/09/2021  . Belching 04/13/2021  . Gassiness 04/07/2021  . Functional dyspepsia 03/20/2021  . Gastroesophageal reflux disease 08/29/2020  . Atypical chest pain 08/17/2020  . Nonintractable episodic headache 01/06/2020  . Anxiety 07/15/2019  . Hidradenitis suppurativa 07/15/2019  . High risk HPV infection 02/07/2017  . Vaginal spotting 10/17/2012    OBJECTIVE:   BP 116/60   Pulse 61   Ht 5\' 8"  (1.727 m)   Wt 157 lb 6.4 oz (71.4 kg)   SpO2 100%   BMI 23.93 kg/m    Physical exam: General: Well-appearing patient, no acute distress Respiratory: Comfortable work of breathing on room air Extremities: Bilateral upper extremities normal in appearance and without deformity or injury; nontender to palpation, full range of motion appreciated bilaterally  ASSESSMENT/PLAN:   Numbness and tingling in both hands Patient with numbness in her bilateral upper extremities that started yesterday 05/04/2021.  No injury/trauma noted that started patient's symptoms.  Low concern for vitamin deficiency at this time but will check.  Patient not taking any medications known to cause this  symptom.  Patient's upper extremities neurovascularly intact -We will check CMP, vitamin B-12 and vitamin B 9     05/06/2021, DO [facility]

## 2021-05-16 ENCOUNTER — Other Ambulatory Visit: Payer: Self-pay

## 2021-05-16 ENCOUNTER — Ambulatory Visit (INDEPENDENT_AMBULATORY_CARE_PROVIDER_SITE_OTHER): Payer: BC Managed Care – PPO

## 2021-05-16 VITALS — BP 127/84 | HR 77 | Wt 158.7 lb

## 2021-05-16 DIAGNOSIS — Z3042 Encounter for surveillance of injectable contraceptive: Secondary | ICD-10-CM | POA: Diagnosis not present

## 2021-05-16 MED ORDER — MEDROXYPROGESTERONE ACETATE 150 MG/ML IM SUSP
150.0000 mg | Freq: Once | INTRAMUSCULAR | Status: AC
  Administered 2021-05-16: 150 mg via INTRAMUSCULAR

## 2021-05-16 NOTE — Progress Notes (Signed)
Debbe Bales here for Depo-Provera Injection. Injection administered without complication. Patient will return in 3 months for next injection between Aug 16 and Aug 30. Next annual visit due 08/2021.   Ralene Bathe, RN 05/16/2021  9:29 AM

## 2021-05-17 NOTE — Progress Notes (Signed)
Patient was assessed and managed by nursing staff during this encounter. I have reviewed the chart and agree with the documentation and plan.   Bernerd Limbo, CNM 05/17/2021 4:49 PM

## 2021-05-31 ENCOUNTER — Encounter: Payer: Self-pay | Admitting: Gastroenterology

## 2021-05-31 ENCOUNTER — Ambulatory Visit (AMBULATORY_SURGERY_CENTER): Payer: BC Managed Care – PPO | Admitting: Gastroenterology

## 2021-05-31 ENCOUNTER — Other Ambulatory Visit: Payer: Self-pay

## 2021-05-31 VITALS — BP 117/70 | HR 66 | Temp 98.0°F | Resp 13 | Ht 68.0 in | Wt 156.0 lb

## 2021-05-31 DIAGNOSIS — R142 Eructation: Secondary | ICD-10-CM

## 2021-05-31 DIAGNOSIS — R079 Chest pain, unspecified: Secondary | ICD-10-CM

## 2021-05-31 DIAGNOSIS — K219 Gastro-esophageal reflux disease without esophagitis: Secondary | ICD-10-CM | POA: Diagnosis not present

## 2021-05-31 MED ORDER — SODIUM CHLORIDE 0.9 % IV SOLN
500.0000 mL | Freq: Once | INTRAVENOUS | Status: DC
Start: 2021-05-31 — End: 2021-05-31

## 2021-05-31 NOTE — Progress Notes (Signed)
1005 Robinul 0.1 mg IV given due large amount of secretions upon assessment.  MD made aware, vss 

## 2021-05-31 NOTE — Progress Notes (Signed)
VS taken by S.B. 

## 2021-05-31 NOTE — Op Note (Signed)
[facility] Patient Name: Katherine Lindsey Procedure Date: 05/31/2021 10:00 AM MRN: [id number] Endoscopist: Meryl Dare , MD Age: 35 Referring MD:  Date of Birth: Aug 19, 1986 Gender: Female Account #: [id number] Procedure:                Upper GI endoscopy Indications:              Chest pain (non cardiac), Belching Medicines:                Monitored Anesthesia Care Procedure:                Pre-Anesthesia Assessment:                           - Prior to the procedure, a History and Physical                            was performed, and patient medications and                            allergies were reviewed. The patient's tolerance of                            previous anesthesia was also reviewed. The risks                            and benefits of the procedure and the sedation                            options and risks were discussed with the patient.                            All questions were answered, and informed consent                            was obtained. Prior Anticoagulants: The patient has                            taken no previous anticoagulant or antiplatelet                            agents. ASA Grade Assessment: II - A patient with                            mild systemic disease. After reviewing the risks                            and benefits, the patient was deemed in                            satisfactory condition to undergo the procedure.                           After obtaining informed consent, the endoscope was  passed under direct vision. Throughout the                            procedure, the patient's blood pressure, pulse, and                            oxygen saturations were monitored continuously. The                            Endoscope was introduced through the mouth, and                            advanced to the second part of duodenum. The upper                            GI endoscopy  was accomplished without difficulty.                            The patient tolerated the procedure well. Scope In: Scope Out: Findings:                 The esophagus was normal.                           The stomach was normal.                           The examined duodenum was normal.                           The cardia and gastric fundus were normal on                            retroflexion. Complications:            No immediate complications. Estimated Blood Loss:     Estimated blood loss: none. Impression:               - Normal esophagus.                           - Normal stomach.                           - Normal examined duodenum.                           - No specimens collected. Recommendation:           - Patient has a contact number available for                            emergencies. The signs and symptoms of potential                            delayed complications were discussed with the  patient. Return to normal activities tomorrow.                            Written discharge instructions were provided to the                            patient.                           - Resume previous diet.                           - Continue present medications.                           - Discontinue omeprazole and famotidine if they are                            not helping symptoms.                           - Gavison or Gas-X qid prn                           - FDgard 1-2 po tid prn                           - Follow up with PCP Meryl Dare, MD 05/31/2021 10:26:34 AM This report has been signed electronically.

## 2021-05-31 NOTE — Progress Notes (Signed)
Report given to PACU, vss 

## 2021-05-31 NOTE — Patient Instructions (Signed)

## 2021-06-02 ENCOUNTER — Telehealth: Payer: Self-pay | Admitting: *Deleted

## 2021-06-02 NOTE — Telephone Encounter (Signed)
Inbound call from patient. Reports she is feeling fine for the most part but gassy and chest sore

## 2021-06-02 NOTE — Telephone Encounter (Signed)
Second follow up call made. 

## 2021-06-02 NOTE — Telephone Encounter (Signed)
Message left

## 2021-06-05 ENCOUNTER — Telehealth: Payer: Self-pay

## 2021-06-05 NOTE — Telephone Encounter (Signed)
Returned pts call.  She states that she is continuing to have gas.  She has tried gas x but does not feel that it is helping.  She now says that she feels "numb on the inside" on her upper left chest area and under the left side of her rib cage.  She states that this is relieved with belching.  This symptom started "a few days after her procedure".  I reviewed her procedure report with normal findings.  She has an appointment with her primary care physician tomorrow morning. Informed her that I would make Dr. Russella Dar aware of her symptoms but that I felt it was a good idea to discuss this with her primary care MD.

## 2021-06-05 NOTE — Telephone Encounter (Signed)
Spoke with patient again. Gave her Dr. Ardell Isaacs recommendations.  She stated that does eat fast and I advised her to try to slow down her eating as well.  Patient will plan to see PCP tomorrow.

## 2021-06-05 NOTE — Telephone Encounter (Signed)
Inbound call from pt requesting a call back stating that she is feeling a numb off and on, on the left side of her chest and she is feeling gas. Please advise. Thanks

## 2021-06-05 NOTE — Telephone Encounter (Signed)
Patient calls nurse line regarding left sided chest numbness since last Wednesday, post Endoscopy. Patient denies chest pain. SHOB, nausea, vomiting and jaw tightness. Patient reports that symptoms improve when she is able to relieve gas. Patient has scheduled follow up appointment tomorrow with Dr. Constance Goltz.   Spoke with Dr. Deirdre Priest regarding patient. Recommended that patient call GI specialist, as this is a problem that has been going on since procedure. Also advised ED precautions and close follow up.   Informed patient of above. Patient verbalizes understanding.   Veronda Prude, RN

## 2021-06-05 NOTE — Telephone Encounter (Signed)
No underlying GI cause was found for her belching and chest pain. Her EGD was normal which is very reassuring. She is likely swallowing air that is causing her to belch. Advise her to take note of how often she is swallowing. Avoid chewing gum, avoid carbonated beverages and avoid gulping foods and beverages. Anxiety is likely contributing to her symptoms and she should address this with her PCP. Treatment of anxiety could improve her belching and chest pain. Further evaluation and treatment per her PCP.

## 2021-06-06 ENCOUNTER — Ambulatory Visit (INDEPENDENT_AMBULATORY_CARE_PROVIDER_SITE_OTHER): Payer: BC Managed Care – PPO | Admitting: Family Medicine

## 2021-06-06 ENCOUNTER — Ambulatory Visit: Payer: BC Managed Care – PPO | Admitting: Family Medicine

## 2021-06-06 ENCOUNTER — Other Ambulatory Visit: Payer: Self-pay

## 2021-06-06 ENCOUNTER — Encounter: Payer: Self-pay | Admitting: Family Medicine

## 2021-06-06 DIAGNOSIS — F419 Anxiety disorder, unspecified: Secondary | ICD-10-CM

## 2021-06-06 NOTE — Progress Notes (Deleted)
    SUBJECTIVE:   CHIEF COMPLAINT / HPI:   Called about chest numbness yesterday.  Told to contact gi and now she has an appt with me.  Did she contact them?   She has somatic symptom disorder. Everything always comes back normal. She's been tested out the wazoo  PERTINENT  PMH / PSH:   OBJECTIVE:   There were no vitals taken for this visit.  ***  ASSESSMENT/PLAN:   No problem-specific Assessment & Plan notes found for this encounter.     Sandre Kitty, MD [place] [facility]   {    This will disappear when note is signed, click to select method of visit    :1}

## 2021-06-06 NOTE — Patient Instructions (Signed)
It was nice to see you today,  On exam I do not find thing concerning.  I think we should give it time for the symptoms are resolved.  If you feel like these symptoms are worsening get always come back and see [place].  I expect them to get better over time.  I do not think we need to get any further testing today.    I have included some information below about a another anxiety medication.  There are several options in addition to this 1.  These medications may help the stress surrounding the symptoms.  Please continue to go to your therapist.  Have a great day,  Frederic Jericho, MD   Bupropion Extended-Release Tablets (Depression/Mood Disorders) What is this medication? BUPROPION (byoo PROE pee on) treats depression. It increases norepinephrine and dopamine in the brain, hormones that help regulate mood. It belongs to a groupof medications called NDRIs. This medicine may be used for other purposes; ask your health care provider orpharmacist if you have questions. COMMON BRAND NAME(S): Aplenzin, Budeprion XL, Forfivo XL, Wellbutrin XL What should I tell my care team before I take this medication? They need to know if you have any of these conditions: An eating disorder, such as anorexia or bulimia Bipolar disorder or psychosis Diabetes or high blood sugar, treated with medication Glaucoma Head injury or brain tumor Heart disease, previous heart attack, or irregular heart beat High blood pressure Kidney or liver disease Seizures (convulsions) Suicidal thoughts or a previous suicide attempt Tourette's syndrome Weight loss An unusual or allergic reaction to bupropion, other medications, foods, dyes, or preservatives Pregnant or trying to become pregnant Breast-feeding How should I use this medication? Take this medication by mouth with a glass of water. Follow the directions on the prescription label. You can take it with or without food. If it upsets your stomach, take it with food. Do not  crush, chew, or cut these tablets. This medication is taken once daily at the same time each day. Do not take your medication more often than directed. Do not stop taking this medication suddenly except upon the advice of your care team. Stopping this medication tooquickly may cause serious side effects or your condition may worsen. A special MedGuide will be given to you by the pharmacist with eachprescription and refill. Be sure to read this information carefully each time. Talk to your care team about the use of this medication in children. Specialcare may be needed. Overdosage: If you think you have taken too much of this medicine contact apoison control center or emergency room at once. NOTE: This medicine is only for you. Do not share this medicine with others. What if I miss a dose? If you miss a dose, skip the missed dose and take your next tablet at theregular time. Do not take double or extra doses. What may interact with this medication? Do not take this medication with any of the following: Linezolid MAOIs like Azilect, Carbex, Eldepryl, Marplan, Nardil, and Parnate Methylene blue (injected into a vein) Other medications that contain bupropion like Zyban This medication may also interact with the following: Alcohol Certain medications for anxiety or sleep Certain medications for blood pressure like metoprolol, propranolol Certain medications for depression or psychotic disturbances Certain medications for HIV or AIDS like efavirenz, lopinavir, nelfinavir, ritonavir Certain medications for irregular heart beat like propafenone, flecainide Certain medications for Parkinson's disease like amantadine, levodopa Certain medications for seizures like carbamazepine, phenytoin, phenobarbital Cimetidine Clopidogrel Cyclophosphamide Digoxin Furazolidone Isoniazid Nicotine  Orphenadrine Procarbazine Steroid medications like prednisone or cortisone Stimulant medications for attention  disorders, weight loss, or to stay awake Tamoxifen Theophylline Thiotepa Ticlopidine Tramadol Warfarin This list may not describe all possible interactions. Give your health care provider a list of all the medicines, herbs, non-prescription drugs, or dietary supplements you use. Also tell them if you smoke, drink alcohol, or use illegaldrugs. Some items may interact with your medicine. What should I watch for while using this medication? Tell your care team if your symptoms do not get better or if they get worse. Visit your care team for regular checks on your progress. Because it may take several weeks to see the full effects of this medication, it is important tocontinue your treatment as prescribed. Watch for new or worsening thoughts of suicide or depression. This includes sudden changes in mood, behavior, or thoughts. These changes can happen at any time but are more common in the beginning of treatment or after a change in dose. Call your care team right away if you experience these thoughts orworsening depression. Manic episodes may happen in patients with bipolar disorder who take this medication. Watch for changes in feelings or behaviors such as feeling anxious, nervous, agitated, panicky, irritable, hostile, aggressive, impulsive, severely restless, overly excited and hyperactive, or trouble sleeping. These symptoms can happen at anytime but are more common in the beginning of treatment or after a change in dose. Call your care team right away if you notice any ofthese symptoms. This medication may cause serious skin reactions. They can happen weeks to months after starting the medication. Contact your care team right away if you notice fevers or flu-like symptoms with a rash. The rash may be red or purple and then turn into blisters or peeling of the skin. Or, you might notice a red rash with swelling of the face, lips or lymph nodes in your neck or under yourarms. Avoid drinks that contain  alcohol while taking this medication. Drinking large amounts of alcohol, using sleeping or anxiety medications, or quickly stopping the use of these agents while taking this medication may increase your risk fora seizure. Do not drive or use heavy machinery until you know how this medication affectsyou. This medication can impair your ability to perform these tasks. Do not take this medication close to bedtime. It may prevent you from sleeping. Your mouth may get dry. Chewing sugarless gum or sucking hard candy, and drinking plenty of water may help. Contact your care team if the problem doesnot go away or is severe. The tablet shell for some brands of this medication does not dissolve. This is normal. The tablet shell may appear whole in the stool. This is not a cause forconcern. What side effects may I notice from receiving this medication? Side effects that you should report to your care team as soon as possible: Allergic reactions-skin rash, itching, hives, swelling of the face, lips, tongue, or throat Increase in blood pressure Mood and behavior changes-anxiety, nervousness, confusion, hallucinations, irritability, hostility, thoughts of suicide or self-harm, worsening mood, feelings of depression Redness, blistering, peeling, or loosening of the skin, including inside the mouth Seizures Sudden eye pain or change in vision such as blurry vision, seeing halos around lights, vision loss Side effects that usually do not require medical attention (report to your careteam if they continue or are bothersome): Constipation Dizziness Dry mouth Loss of appetite Nausea Tremors or shaking Trouble sleeping This list may not describe all possible side effects. Call your doctor for  medical advice about side effects. You may report side effects to FDA at1-800-FDA-1088. Where should I keep my medication? Keep out of the reach of children and pets. Store at room temperature between 15 and 30 degrees C  (59 and 86 degrees F).Throw away any unused medication after the expiration date. NOTE: This sheet is a summary. It may not cover all possible information. If you have questions about this medicine, talk to your doctor, pharmacist, orhealth care provider.  2022 Elsevier/Gold Standard (2021-02-14 14:27:06)

## 2021-06-06 NOTE — Progress Notes (Signed)
    SUBJECTIVE:   CHIEF COMPLAINT / HPI:   Left sided numbness: Patient complains of recent onset of "numbness" on the left side from the neck down to her leg.  Also the left side of her chest.  Describes it also as a "cold feeling".  It will occur randomly, and then go away for a few hours before returning.  Also having bilateral medial foot pain episodes that she describes as feeling like a sharp pain when she breathes in deeply.  Patient states she noticed the symptoms improve if she drinks wine.  Patient talks about how she feels like she has received every test imaginable and they all come back normal.  She wonders aloud if this can be related to her anxiety.  We discussed how this can sometimes be the case.  Patient wants time to think about starting a new anxiety medication.  She still goes to her therapist.  She states that her panic attack symptoms are much improved.  PERTINENT  PMH / PSH: Anxiety  OBJECTIVE:   BP 106/60   Pulse 65   Wt 156 lb 6.4 oz (70.9 kg)   SpO2 97%   BMI 23.78 kg/m   General: Alert and oriented.  No acute distress. HEENT: PERRLA, EOMI. CV: Regular rate and rhythm, no murmurs. Pulmonary: Lungs good auscultation bilaterally Neuro: Cranial nerves II through XII grossly intact, 5/5 strength upper and lower extremities bilaterally.  Sensation to light touch and pinprick intact in the upper and lower extremities bilaterally.  ASSESSMENT/PLAN:   Anxiety Patient presents today with new symptoms since her recent EGD.  This includes left-sided sensation that she describes alternately as numbness or cold feeling.  Physical exam including neurologic exam normal.  Over the past year patient has presented with multiple different complaints that receive thorough negative work-ups.  Most recently an EGD that was completely normal for epigastric pain and gassiness.  Has also received several chest pain work-ups in the ED.  The fact that this symptoms seem to go away when  she drinks wine lends further evidence that this can be mostly anxiety driven somatic complaints.  No formal diagnosis of somatic symptom disorder.  We will still need to consider other possibilities such as MS in the future if her physical exam or labs/imaging becomes abnormal.  For now, will wait and reevaluate at next visit.  Gave patient information on an alternative anxiety medication today.  In the future could consider bupropion, SNRI, even TCA.  Has tried an SSRI in the past and was very sensitive to the side effects but did eventually take it consistently for a while.  Encouraged her to continue going to therapy.  She has made great improvements in her anxiety overall, but her somatic complaints remain.     Sandre Kitty, MD [facility]

## 2021-06-07 DIAGNOSIS — R6889 Other general symptoms and signs: Secondary | ICD-10-CM | POA: Insufficient documentation

## 2021-06-07 NOTE — Assessment & Plan Note (Signed)
Patient presents today with new symptoms since her recent EGD.  This includes left-sided sensation that she describes alternately as numbness or cold feeling.  Physical exam including neurologic exam normal.  Over the past year patient has presented with multiple different complaints that receive thorough negative work-ups.  Most recently an EGD that was completely normal for epigastric pain and gassiness.  Has also received several chest pain work-ups in the ED.  The fact that this symptoms seem to go away when she drinks wine lends further evidence that this can be mostly anxiety driven somatic complaints.  No formal diagnosis of somatic symptom disorder.  We will still need to consider other possibilities such as MS in the future if her physical exam or labs/imaging becomes abnormal.  For now, will wait and reevaluate at next visit.  Gave patient information on an alternative anxiety medication today.  In the future could consider bupropion, SNRI, even TCA.  Has tried an SSRI in the past and was very sensitive to the side effects but did eventually take it consistently for a while.  Encouraged her to continue going to therapy.  She has made great improvements in her anxiety overall, but her somatic complaints remain.

## 2021-06-30 ENCOUNTER — Other Ambulatory Visit: Payer: Self-pay

## 2021-06-30 ENCOUNTER — Emergency Department (HOSPITAL_COMMUNITY)
Admission: EM | Admit: 2021-06-30 | Discharge: 2021-06-30 | Disposition: A | Discharge status: Home / Self Care | Payer: BC Managed Care – PPO | Attending: Emergency Medicine | Admitting: Emergency Medicine

## 2021-06-30 ENCOUNTER — Emergency Department (HOSPITAL_COMMUNITY): Payer: BC Managed Care – PPO

## 2021-06-30 DIAGNOSIS — R200 Anesthesia of skin: Secondary | ICD-10-CM | POA: Diagnosis not present

## 2021-06-30 DIAGNOSIS — R519 Headache, unspecified: Secondary | ICD-10-CM | POA: Insufficient documentation

## 2021-06-30 DIAGNOSIS — R29818 Other symptoms and signs involving the nervous system: Secondary | ICD-10-CM | POA: Diagnosis not present

## 2021-06-30 DIAGNOSIS — R202 Paresthesia of skin: Secondary | ICD-10-CM | POA: Diagnosis not present

## 2021-06-30 DIAGNOSIS — R079 Chest pain, unspecified: Secondary | ICD-10-CM | POA: Diagnosis not present

## 2021-06-30 DIAGNOSIS — G43109 Migraine with aura, not intractable, without status migrainosus: Secondary | ICD-10-CM

## 2021-06-30 DIAGNOSIS — R0789 Other chest pain: Secondary | ICD-10-CM | POA: Insufficient documentation

## 2021-06-30 DIAGNOSIS — R42 Dizziness and giddiness: Secondary | ICD-10-CM | POA: Diagnosis not present

## 2021-06-30 LAB — COMPREHENSIVE METABOLIC PANEL
ALT: 16 U/L (ref 0–44)
AST: 16 U/L (ref 15–41)
Albumin: 4.7 g/dL (ref 3.5–5.0)
Alkaline Phosphatase: 62 U/L (ref 38–126)
Anion gap: 8 (ref 5–15)
BUN: 16 mg/dL (ref 6–20)
CO2: 22 mmol/L (ref 22–32)
Calcium: 9.2 mg/dL (ref 8.9–10.3)
Chloride: 107 mmol/L (ref 98–111)
Creatinine, Ser: 0.73 mg/dL (ref 0.44–1.00)
GFR, Estimated: >60 mL/min (ref >60)
Glucose, Bld: 93 mg/dL (ref 70–99)
Potassium: 3.2 mmol/L — ABNORMAL LOW (ref 3.5–5.1)
Sodium: 137 mmol/L (ref 135–145)
Total Bilirubin: 0.7 mg/dL (ref 0.3–1.2)
Total Protein: 7.8 g/dL (ref 6.5–8.1)

## 2021-06-30 LAB — CBC WITH DIFFERENTIAL/PLATELET
Abs Immature Granulocytes: 0.01 10*3/uL (ref 0.00–0.07)
Basophils Absolute: 0 10*3/uL (ref 0.0–0.1)
Basophils Relative: 1 %
Eosinophils Absolute: 0.1 10*3/uL (ref 0.0–0.5)
Eosinophils Relative: 2 %
HCT: 37.8 % (ref 36.0–46.0)
Hemoglobin: 12.4 g/dL (ref 12.0–15.0)
Immature Granulocytes: 0 %
Lymphocytes Relative: 42 %
Lymphs Abs: 3 10*3/uL (ref 0.7–4.0)
MCH: 29.6 pg (ref 26.0–34.0)
MCHC: 32.8 g/dL (ref 30.0–36.0)
MCV: 90.2 fL (ref 80.0–100.0)
Monocytes Absolute: 0.6 10*3/uL (ref 0.1–1.0)
Monocytes Relative: 8 %
Neutro Abs: 3.5 10*3/uL (ref 1.7–7.7)
Neutrophils Relative %: 47 %
Platelets: 241 10*3/uL (ref 150–400)
RBC: 4.19 MIL/uL (ref 3.87–5.11)
RDW: 12.2 % (ref 11.5–15.5)
WBC: 7.3 10*3/uL (ref 4.0–10.5)
nRBC: 0 % (ref 0.0–0.2)

## 2021-06-30 LAB — MAGNESIUM: Magnesium: 2.2 mg/dL (ref 1.7–2.4)

## 2021-06-30 LAB — TROPONIN I (HIGH SENSITIVITY): Troponin I (High Sensitivity): <2 ng/L (ref <18)

## 2021-06-30 LAB — D-DIMER, QUANTITATIVE: D-Dimer, Quant: <0.27 ug/mL-FEU (ref 0.00–0.50)

## 2021-06-30 MED ORDER — GADOBUTROL 1 MMOL/ML IV SOLN
7.0000 mL | Freq: Once | INTRAVENOUS | Status: AC | PRN
  Administered 2021-06-30: 7 mL via INTRAVENOUS

## 2021-06-30 NOTE — ED Provider Notes (Signed)
[facility]   CSN: [id number] Arrival date & time: 06/30/21  0133     History Chief Complaint  Patient presents with   Numbness   Headache    Katherine Lindsey is a 35 y.o. female.  Patient has a history of anxiety, Crohn's disease in remission, Depo-Provera use here with a 3-day history of "numbness" involving her entire left face and left side of her body.  She has difficulty describing the sensation most pain just feels different and does not feel like pins-and-needles.  She feels like she is having some increased heaviness using her left arm.  Earlier today she noticed a gradual onset right-sided headache that was not there initially with the numbness but is there currently.  Denies any difficulty speaking or difficulty swallowing.  Denies any weakness in her arms or legs.  States the left side of her body just feels different.  There is no photophobia or phonophobia. She is also having some left-sided chest pain that has been fairly constant for the past 1 day, worse with palpation and movement.  No cough or fever.  No shortness of breath.  Denies any history of migraines.  Had a similar episode of numbness in June and it went away on its own after about one day.  She saw her doctor was told to come to the ED if it recurred.  The history is provided by the patient.  Headache Associated symptoms: dizziness and numbness   Associated symptoms: no abdominal pain, no congestion, no cough, no fever, no myalgias, no nausea, no photophobia, no vomiting and no weakness       Past Medical History:  Diagnosis Date   Anxiety    Crohn's disease in remission (HCC)    Hasn't had symptoms since 2004   Gallstones    Hammertoe 09/21/2014   History of low transverse cesarean section 11/28/2015   VBAC, delivered 03/19/2016    Patient Active Problem List   Diagnosis Date Noted   Numbness and tingling in both hands 05/09/2021   Belching  04/13/2021   Gassiness 04/07/2021   Functional dyspepsia 03/20/2021   Gastroesophageal reflux disease 08/29/2020   Atypical chest pain 08/17/2020   Nonintractable episodic headache 01/06/2020   Anxiety 07/15/2019   Hidradenitis suppurativa 07/15/2019   High risk HPV infection 02/07/2017   Vaginal spotting 10/17/2012    Past Surgical History:  Procedure Laterality Date   CESAREAN SECTION N/A 06/16/2013   Procedure: CESAREAN SECTION;  Surgeon: Reva Bores, MD;  Location: [facility];  Service: Obstetrics;  Laterality: N/A;   CHOLECYSTECTOMY  08/28/2012   Procedure: LAPAROSCOPIC CHOLECYSTECTOMY WITH INTRAOPERATIVE CHOLANGIOGRAM;  Surgeon: [name], DO;  Location: [facility] OR;  Service: General;  Laterality: N/A;   INDUCED ABORTION  02/2012     OB History     Gravida  4   Para  3   Term  3   Preterm      AB  1   Living  3      SAB  0   IAB  1   Ectopic      Multiple  0   Live Births  3        Obstetric Comments  2014 LTCS with extension of hysterotomy inferiorly         Family History  Problem Relation Age of Onset   Other Neg Hx    Diabetes Neg Hx    Heart disease Neg Hx  Hyperlipidemia Neg Hx    Hypertension Neg Hx    Stroke Neg Hx    Colon cancer Neg Hx    Esophageal cancer Neg Hx    Pancreatic cancer Neg Hx    Stomach cancer Neg Hx     Social History   Tobacco Use   Smoking status: Never   Smokeless tobacco: Never  Vaping Use   Vaping Use: Never used  Substance Use Topics   Alcohol use: Yes    Alcohol/week: 1.0 standard drink    Types: 1 Glasses of wine per week   Drug use: No    Home Medications Prior to Admission medications   Medication Sig Start Date End Date Taking? Authorizing Provider  famotidine (PEPCID) 20 MG tablet Take 20 mg by mouth 2 (two) times daily.    [provider]  medroxyPROGESTERone (DEPO-PROVERA) 150 MG/ML injection Inject 150 mg into the muscle every 3 (three) months.    [provider]   omeprazole (PRILOSEC) 40 MG capsule Take 1 capsule (40 mg total) by mouth daily. 04/05/21   Dollene Cleveland, DO  promethazine (PHENERGAN) 25 MG tablet Take 1 tablet (25 mg total) by mouth every 8 (eight) hours as needed (headache). Patient not taking: Reported on 07/14/2020 03/28/20 08/16/20  Gilda Crease, MD  SUMAtriptan (IMITREX) 50 MG tablet Take 1 tablet (50 mg total) by mouth every 2 (two) hours as needed for migraine. May repeat in 2 hours if headache persists or recurs. Patient not taking: Reported on 07/14/2020 01/27/20 08/16/20  Gailen Shelter, PA    Allergies    Patient has no known allergies.  Review of Systems   Review of Systems  Constitutional:  Negative for activity change, appetite change and fever.  HENT:  Negative for congestion and rhinorrhea.   Eyes:  Negative for photophobia and visual disturbance.  Respiratory:  Positive for chest tightness. Negative for cough and shortness of breath.   Cardiovascular:  Positive for chest pain.  Gastrointestinal:  Negative for abdominal pain, nausea and vomiting.  Genitourinary:  Negative for dysuria and hematuria.  Musculoskeletal:  Negative for arthralgias and myalgias.  Skin:  Negative for rash.  Neurological:  Positive for dizziness, numbness and headaches. Negative for weakness.   all other systems are negative except as noted in the HPI and PMH.   Physical Exam Updated Vital Signs BP 133/89 (BP Location: Right Arm)   Pulse 67   Temp 97.9 F (36.6 C) (Oral)   Resp 18   Ht 5\' 8"  (1.727 m)   Wt 74.4 kg   SpO2 99%   BMI 24.94 kg/m   Physical Exam Vitals and nursing note reviewed.  Constitutional:      General: She is not in acute distress.    Appearance: She is well-developed.  HENT:     Head: Normocephalic and atraumatic.     Mouth/Throat:     Pharynx: No oropharyngeal exudate.  Eyes:     Conjunctiva/sclera: Conjunctivae normal.     Pupils: Pupils are equal, round, and reactive to light.  Neck:      Comments: No meningismus. Cardiovascular:     Rate and Rhythm: Normal rate and regular rhythm.     Heart sounds: Normal heart sounds. No murmur heard. Pulmonary:     Effort: Pulmonary effort is normal. No respiratory distress.     Breath sounds: Normal breath sounds.     Comments: Reproducible left-sided chest tenderness Chest:     Chest wall: Tenderness present.  Abdominal:     Palpations: Abdomen is soft.     Tenderness: There is no abdominal tenderness. There is no guarding or rebound.  Musculoskeletal:        General: No tenderness. Normal range of motion.     Cervical back: Normal range of motion and neck supple.  Skin:    General: Skin is warm.  Neurological:     Mental Status: She is alert and oriented to person, place, and time.     Cranial Nerves: No cranial nerve deficit.     Motor: No abnormal muscle tone.     Coordination: Coordination normal.     Comments: CN 2-12 intact, no ataxia on finger to nose, no nystagmus, 5/5 strength throughout, no pronator drift, Romberg negative, normal gait. Subjective numbness involving left face, left arm, left trunk and left leg.  Psychiatric:        Behavior: Behavior normal.    ED Results / Procedures / Treatments   Labs (all labs ordered are listed, but only abnormal results are displayed) Labs Reviewed  COMPREHENSIVE METABOLIC PANEL - Abnormal; Notable for the following components:      Result Value   Potassium 3.2 (*)    All other components within normal limits  CBC WITH DIFFERENTIAL/PLATELET  D-DIMER, QUANTITATIVE  MAGNESIUM  TROPONIN I (HIGH SENSITIVITY)  TROPONIN I (HIGH SENSITIVITY)    EKG EKG Interpretation  Date/Time:  Friday June 30 2021 04:06:49 EDT Ventricular Rate:  62 PR Interval:  149 QRS Duration: 77 QT Interval:  395 QTC Calculation: 402 R Axis:   80 Text Interpretation: Sinus rhythm No significant change was found Confirmed by Glynn Octave [id number]) on 06/30/2021 4:10:59 AM  Radiology No  results found.  Procedures Procedures   Medications Ordered in ED Medications - No data to display  ED Course  I have reviewed the triage vital signs and the nursing notes.  Pertinent labs & imaging results that were available during my care of the patient were reviewed by me and considered in my medical decision making (see chart for details).    MDM Rules/Calculators/A&P                         3 days of left-sided numbness without motor deficit.  Low suspicion for stroke or CVA.  Code stroke not activated due to ongoing symptoms out of treatment window and low NIH.  Chest pain is reproducible, low suspicion for ACS or PE EKG is sinus rhythm and CXR is negative.  Troponin and D-dimer negative.   Suspect her numbness is due to complicated migraine. With ongoing L facial numbness and no previous history of similar, will need MRI evaluation. Patient agreeable to wait until MRI become available after 6am. Declines medication for headache.  Anticipate followup with neurology and PCP if MRI reassuring.  Dr. Jeraldine Loots to assume care.  Final Clinical Impression(s) / ED Diagnoses Final diagnoses:  None    Rx / DC Orders ED Discharge Orders     None        Nusaiba Guallpa, Jeannett Senior, MD 06/30/21 [phone]

## 2021-06-30 NOTE — Discharge Instructions (Signed)
Your testing is reassuring.  There no evidence of stroke.  You should follow-up with the neurologist for further evaluation of your headache and numbness. Return to the ED with new or worsening symptoms

## 2021-06-30 NOTE — ED Provider Notes (Signed)
10:04 AM Patient in no distress.  She now notes that she has had months of feeling differently, points specifically to a small area of numbness in the medial portion of her proximal left foot states that she occasionally has tingling in that area.  She is awake, alert, hemodynamically unremarkable. I reviewed her MRI, discussed with her.  Labs reassuring, vitals reassuring, MRI reassuring.  Patient has a primary care physician, was discharged in stable condition.   Gerhard Munch, MD 06/30/21 1005

## 2021-06-30 NOTE — ED Triage Notes (Signed)
Patient arrives after 3 days of a numbness to her left side that goes to her left chest and a right sided headache. Patient denies any dizziness but has a headache. Patient said she had something similar happen in June but it went away on its own.

## 2021-06-30 NOTE — ED Notes (Signed)
Patient transported to MRI 

## 2021-07-29 IMAGING — DX DG CHEST 2V
2 series · 2 of 2 positions shown · non-contrast
Comparison: Chest radiographs 03/27/2021 and earlier.

CLINICAL DATA: 35-year-old female with chest pain under the left
breast and shortness of breath since 2422 hours.

EXAM:
CHEST - 2 VIEW

[w chest pa]
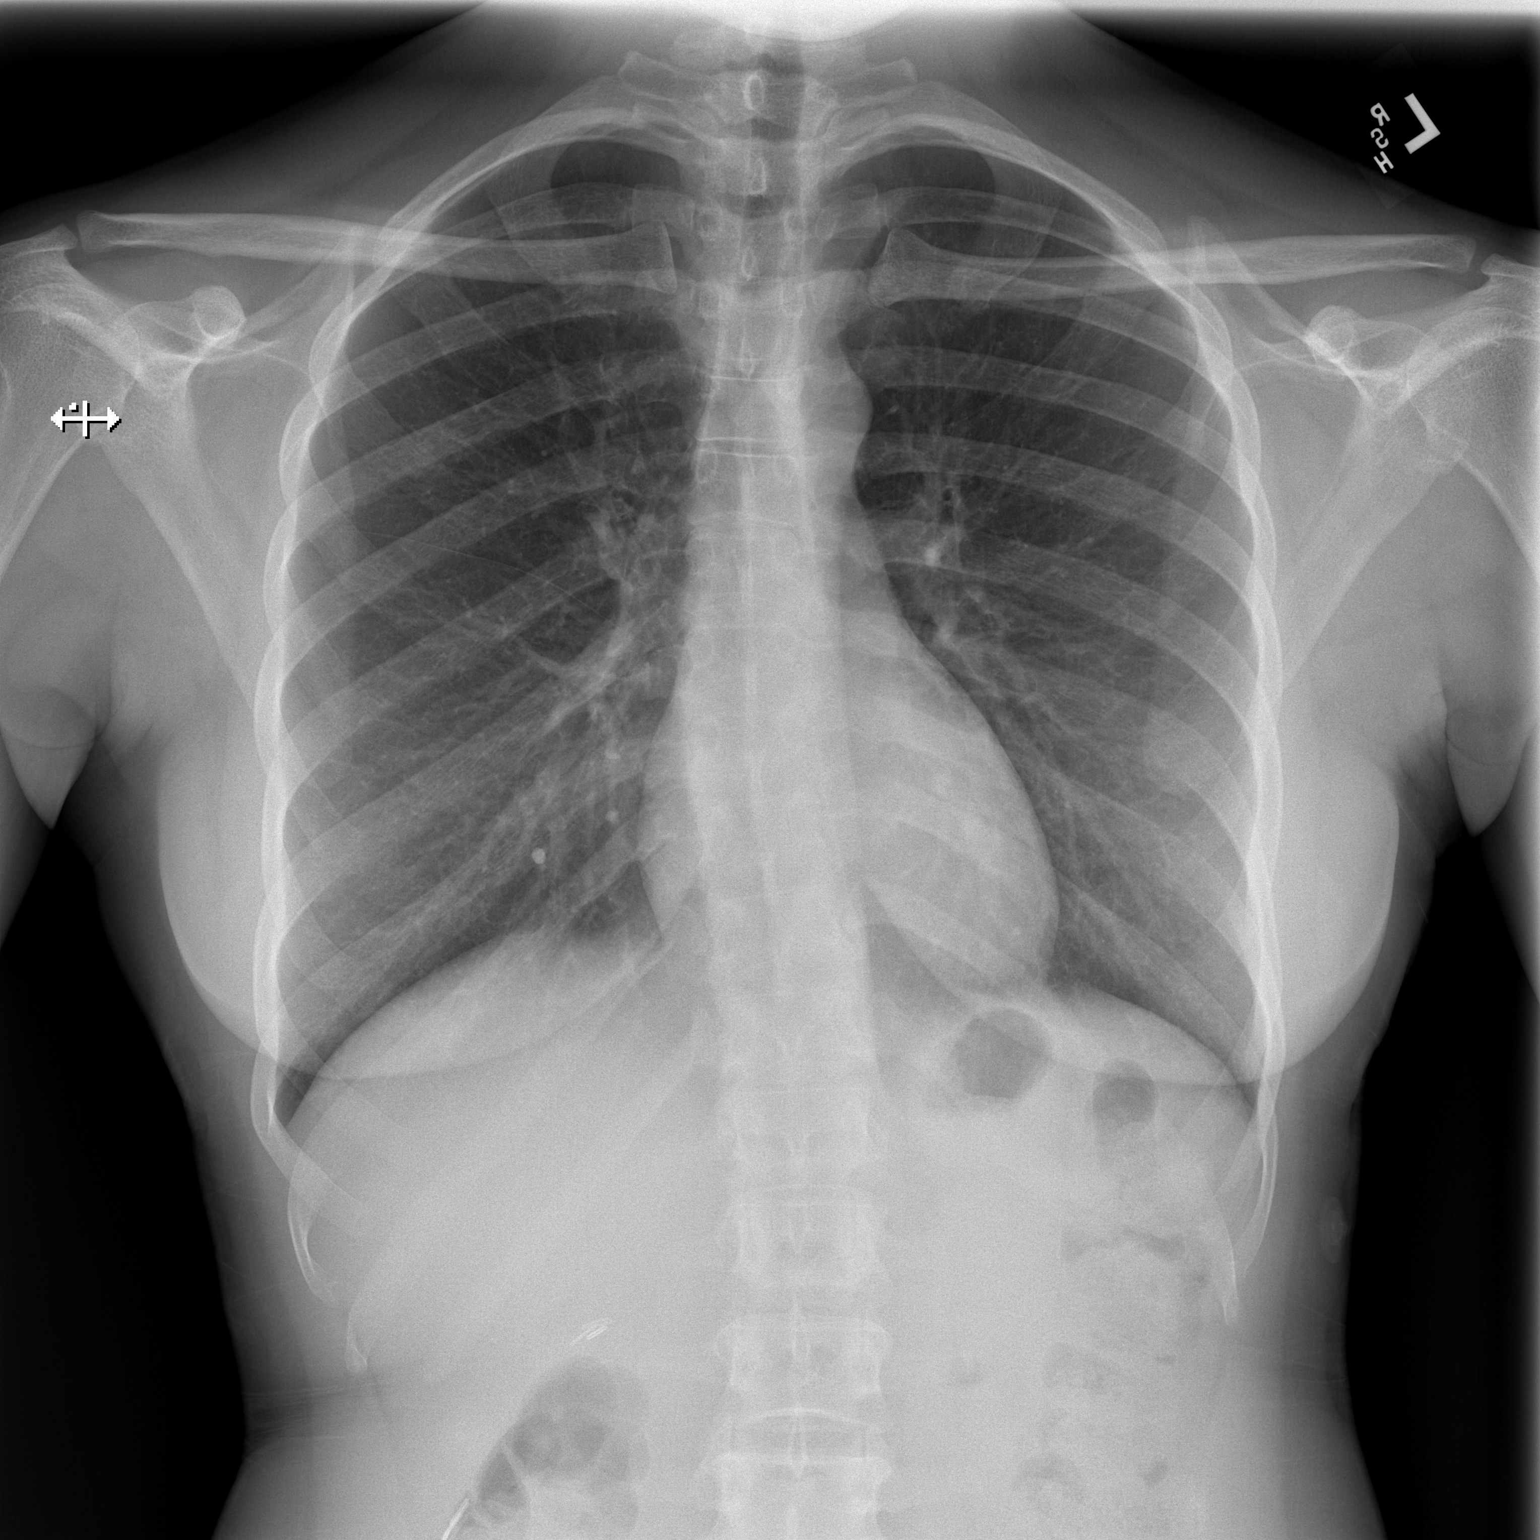

[w chest lat]
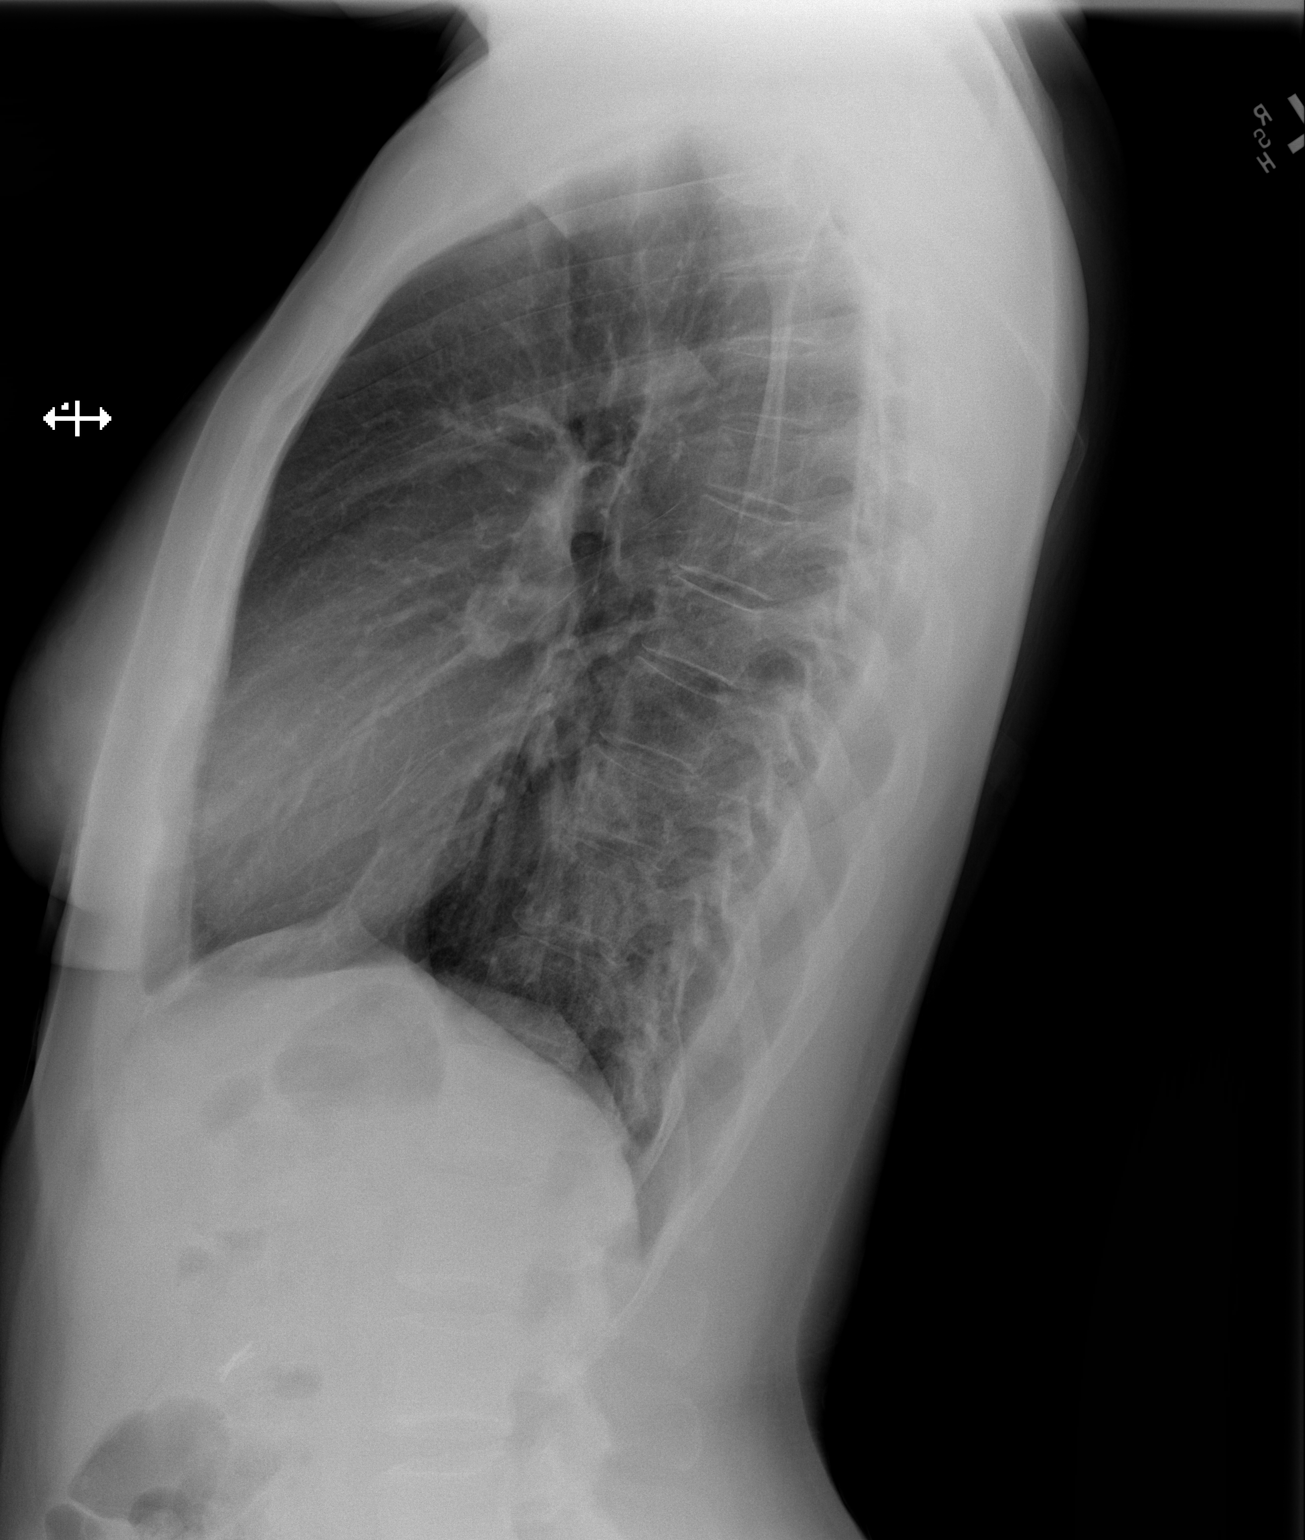

[2 of 2 positions shown; findings below may reference images not displayed]

FINDINGS: Stable mild scoliosis. Lung volumes and mediastinal contours remain
normal. Lung markings appear stable, both lungs appear clear. No
pneumothorax or pleural effusion.

Stable cholecystectomy clips. Negative visible bowel gas pattern. No
acute osseous abnormality identified.
IMPRESSION: Negative, no cardiopulmonary abnormality.  Mild scoliosis.

## 2021-08-01 ENCOUNTER — Other Ambulatory Visit: Payer: Self-pay

## 2021-08-01 ENCOUNTER — Ambulatory Visit (INDEPENDENT_AMBULATORY_CARE_PROVIDER_SITE_OTHER): Payer: BC Managed Care – PPO

## 2021-08-01 VITALS — BP 123/81 | HR 58 | Ht 68.0 in | Wt 167.4 lb

## 2021-08-01 DIAGNOSIS — Z3042 Encounter for surveillance of injectable contraceptive: Secondary | ICD-10-CM

## 2021-08-01 MED ORDER — MEDROXYPROGESTERONE ACETATE 150 MG/ML IM SUSP
150.0000 mg | Freq: Once | INTRAMUSCULAR | Status: AC
  Administered 2021-08-01: 150 mg via INTRAMUSCULAR

## 2021-08-01 NOTE — Progress Notes (Signed)
Debbe Bales here for Depo-Provera Injection. Injection administered without complication. Patient will return in 3 months for next injection between Nov 1 and Nov 15,2022. Next annual visit due in Sept 2022, but pt will now wait for next Depo injection. Pt verbalized understanding.   Isabell Jarvis, RN 08/01/2021  8:45 AM

## 2021-08-15 ENCOUNTER — Telehealth: Payer: Self-pay | Admitting: Family Medicine

## 2021-08-15 ENCOUNTER — Ambulatory Visit (INDEPENDENT_AMBULATORY_CARE_PROVIDER_SITE_OTHER): Payer: BC Managed Care – PPO | Admitting: Family Medicine

## 2021-08-15 ENCOUNTER — Other Ambulatory Visit: Payer: Self-pay

## 2021-08-15 ENCOUNTER — Encounter: Payer: Self-pay | Admitting: Family Medicine

## 2021-08-15 VITALS — BP 110/68 | HR 75 | Ht 68.0 in | Wt 168.4 lb

## 2021-08-15 DIAGNOSIS — H109 Unspecified conjunctivitis: Secondary | ICD-10-CM | POA: Insufficient documentation

## 2021-08-15 MED ORDER — ERYTHROMYCIN 5 MG/GM OP OINT: 1.0000 "application " | TOPICAL_OINTMENT | Freq: Four times a day (QID) | OPHTHALMIC | 0 refills | Status: DC

## 2021-08-15 NOTE — Telephone Encounter (Signed)
**  After Hours/ Emergency Line Call**  Received a call to report that Katherine Lindsey was experiencing redness in her right eye. Awoke with red eye. Endorsing pain and sensitivity to light.  Denying periorbital swelling, drainage/discharge, or changes in vision.  Recommended that patient be evaluated in clinic or Urgent Care for eye fluorescein exam. Advised Tylenol and/or Ibuprofen for pain relief. Recommended that someone drive her. Red flags discussed.  Will forward to PCP.  Sabino Dick, DO PGY-2, [facility] 08/15/2021 7:34 AM

## 2021-08-15 NOTE — Assessment & Plan Note (Signed)
35 year old female with conjunctivitis and purulence noted on the medial aspect, only present in the right eye.  She is not a contact wearer.  She denies any injury to that eye.  She initially complained of some photophobia but does not have any on physical exam.  Attempted to visualize the optic nerve but was unable to successfully do so.  She did recently bumped her head on a cabinet which was a minor bump and did not cause her to lose consciousness or feel dazed.  She has had no other trauma or injury to the area and she did not strike in the vicinity of her eye at that time.  She has extraocular movements intact.  Most likely differential is bacterial conjunctivitis.  Will treat with erythromycin drops.  Discussed return precautions and patient will follow up with [place] if her symptoms or not improving in the next 3 to 4 days.

## 2021-08-15 NOTE — Progress Notes (Signed)
    SUBJECTIVE:   CHIEF COMPLAINT / HPI:   Eye pain: 35 year old female presenting with eye pain which started yesterday. She noticed the sun really hurt her eye when being outside yesterday and it slowly got more and more red over the day. She has never had anything like this before. It is painful with a sharp pain. She bumped her head on a cabinet above her eye the day before it started but did not lose consciousness or get daised and says it was a minor bump.  She does not wear contacts.  She does have a mild headache which started today.  PERTINENT  PMH / PSH: None relevant  OBJECTIVE:   BP 110/68   Pulse 75   Ht 5\' 8"  (1.727 m)   Wt 168 lb 6 oz (76.4 kg)   SpO2 98%   BMI 25.60 kg/m    General: NAD, pleasant, able to participate in exam HEENT: Right eye with no photophobia on physical exam, conjunctive is erythematous with some purulence noted on the medial aspect, left eye appears normal. Respiratory: No respiratory distress Neuro: alert,  CN II through XII intact, fine touch sensation intact in upper and lower extremities bilaterally, strength 5/5 in upper and lower extremities bilaterally Psych: Normal affect and mood  ASSESSMENT/PLAN:   Bacterial conjunctivitis 35 year old female with conjunctivitis and purulence noted on the medial aspect, only present in the right eye.  She is not a contact wearer.  She denies any injury to that eye.  She initially complained of some photophobia but does not have any on physical exam.  Attempted to visualize the optic nerve but was unable to successfully do so.  She did recently bumped her head on a cabinet which was a minor bump and did not cause her to lose consciousness or feel dazed.  She has had no other trauma or injury to the area and she did not strike in the vicinity of her eye at that time.  She has extraocular movements intact.  Most likely differential is bacterial conjunctivitis.  Will treat with erythromycin drops.  Discussed  return precautions and patient will follow up with 20 if her symptoms or not improving in the next 3 to 4 days.   [place], DO [facility]

## 2021-08-15 NOTE — Patient Instructions (Signed)
Your symptoms are consistent with bacterial conjunctivitis, bacterial infection of the eye.  I am prescribing an antibiotic ointment to place in your eye 4 times per day.  The easiest way to do this is to pull your lower lid out and place a small amount on the inside of the lower lid followed by blinking several times.  This will help through the antibiotic around her eye.  It should improve your symptoms within 24 hours but please use it for the full course of 5 days.  If you develop any worsening symptoms, worsening pain, change in vision, or other concerning symptoms please let us know immediately

## 2021-08-16 ENCOUNTER — Other Ambulatory Visit: Payer: Self-pay

## 2021-08-16 ENCOUNTER — Encounter (HOSPITAL_COMMUNITY): Payer: Self-pay | Admitting: *Deleted

## 2021-08-16 ENCOUNTER — Emergency Department (HOSPITAL_COMMUNITY)
Admission: EM | Admit: 2021-08-16 | Discharge: 2021-08-16 | Disposition: A | Discharge status: Home / Self Care | Payer: BC Managed Care – PPO | Attending: Emergency Medicine | Admitting: Emergency Medicine

## 2021-08-16 ENCOUNTER — Emergency Department (HOSPITAL_COMMUNITY): Payer: BC Managed Care – PPO

## 2021-08-16 DIAGNOSIS — H5711 Ocular pain, right eye: Secondary | ICD-10-CM | POA: Diagnosis not present

## 2021-08-16 DIAGNOSIS — J342 Deviated nasal septum: Secondary | ICD-10-CM | POA: Diagnosis not present

## 2021-08-16 DIAGNOSIS — H1031 Unspecified acute conjunctivitis, right eye: Secondary | ICD-10-CM | POA: Insufficient documentation

## 2021-08-16 DIAGNOSIS — N9489 Other specified conditions associated with female genital organs and menstrual cycle: Secondary | ICD-10-CM | POA: Diagnosis not present

## 2021-08-16 LAB — I-STAT BETA HCG BLOOD, ED (MC, WL, AP ONLY): I-stat hCG, quantitative: <5 m[IU]/mL (ref <5)

## 2021-08-16 MED ORDER — IOHEXOL 350 MG/ML SOLN
75.0000 mL | Freq: Once | INTRAVENOUS | Status: AC | PRN
  Administered 2021-08-16: 60 mL via INTRAVENOUS

## 2021-08-16 MED ORDER — FLUORESCEIN SODIUM 1 MG OP STRP
1.0000 | ORAL_STRIP | Freq: Once | OPHTHALMIC | Status: AC
  Administered 2021-08-16: 1 via OPHTHALMIC
  Filled 2021-08-16: qty 1

## 2021-08-16 MED ORDER — TETRACAINE HCL 0.5 % OP SOLN
2.0000 [drp] | Freq: Once | OPHTHALMIC | Status: AC
  Administered 2021-08-16: 2 [drp] via OPHTHALMIC
  Filled 2021-08-16: qty 4

## 2021-08-16 NOTE — ED Triage Notes (Signed)
Pt complains of right eye pain, sensitivity to light since yesterday. Pain started after sunlight hit her eye. Went to pcp, had erythromycin ointment prescribed and told to follow up at ED if pain worsens.

## 2021-08-16 NOTE — Discharge Instructions (Addendum)
Please follow-up with your PCP for a recheck as needed in 2 to 3 days.  Continue to apply the erythromycin ointment.  Your CT scan did not reveal any concerning findings.  Your physical exam did not reveal evidence of any scratch on your eye. Your symptoms are consistent with bacterial conjunctivitis.

## 2021-08-16 NOTE — ED Notes (Signed)
OD:   20/25    OS:  20/25    OU:   20/20

## 2021-08-16 NOTE — ED Notes (Signed)
ED Provider at bedside. 

## 2021-08-16 NOTE — ED Provider Notes (Signed)
[facility]EMERGENCY DEPT Provider Note   CSN: [id number] Arrival date & time: 08/16/21  9518     History Chief Complaint  Patient presents with   Eye Pain    Katherine Lindsey is a 35 y.o. female.  The history is provided by the patient.  Eye Pain This is a new problem. The current episode started 2 days ago. The problem occurs constantly. The problem has not changed since onset.Pertinent negatives include no chest pain, no abdominal pain, no headaches and no shortness of breath. Exacerbated by: Sherlynn Stalls.   The patient presents with right eye pain and some photophobia for 2 days.  She took to the sun while being outside 2 days ago and felt that her symptoms slowly developed throughout the day.  She endorses sharp pain with redness throughout her eye.  She did hit her head 1 day before but did not hit her eye.  She is not a contact lens wearer.  She denies any blurry vision or vision loss.  Past Medical History:  Diagnosis Date   Anxiety    Crohn's disease in remission [facility])    Hasn't had symptoms since 2004   Gallstones    Hammertoe 09/21/2014   History of low transverse cesarean section 11/28/2015   VBAC, delivered 03/19/2016    Patient Active Problem List   Diagnosis Date Noted   Bacterial conjunctivitis 08/15/2021   Numbness and tingling in both hands 05/09/2021   Belching 04/13/2021   Gassiness 04/07/2021   Functional dyspepsia 03/20/2021   Gastroesophageal reflux disease 08/29/2020   Atypical chest pain 08/17/2020   Nonintractable episodic headache 01/06/2020   Anxiety 07/15/2019   Hidradenitis suppurativa 07/15/2019   High risk HPV infection 02/07/2017   Vaginal spotting 10/17/2012    Past Surgical History:  Procedure Laterality Date   CESAREAN SECTION N/A 06/16/2013   Procedure: CESAREAN SECTION;  Surgeon: Reva Bores, MD;  Location: [facility];  Service: Obstetrics;  Laterality: N/A;   CHOLECYSTECTOMY  08/28/2012   Procedure: LAPAROSCOPIC  CHOLECYSTECTOMY WITH INTRAOPERATIVE CHOLANGIOGRAM;  Surgeon: [name], DO;  Location: [facility] OR;  Service: General;  Laterality: N/A;   INDUCED ABORTION  02/2012     OB History     Gravida  4   Para  3   Term  3   Preterm      AB  1   Living  3      SAB  0   IAB  1   Ectopic      Multiple  0   Live Births  3        Obstetric Comments  2014 LTCS with extension of hysterotomy inferiorly         Family History  Problem Relation Age of Onset   Other Neg Hx    Diabetes Neg Hx    Heart disease Neg Hx    Hyperlipidemia Neg Hx    Hypertension Neg Hx    Stroke Neg Hx    Colon cancer Neg Hx    Esophageal cancer Neg Hx    Pancreatic cancer Neg Hx    Stomach cancer Neg Hx     Social History   Tobacco Use   Smoking status: Never   Smokeless tobacco: Never  Vaping Use   Vaping Use: Never used  Substance Use Topics   Alcohol use: Yes    Alcohol/week: 1.0 standard drink    Types: 1 Glasses of wine per week   Drug use: No  Home Medications Prior to Admission medications   Medication Sig Start Date End Date Taking? Authorizing Provider  erythromycin ophthalmic ointment Place 1 application into the right eye 4 (four) times daily. Use a small amount on the lower eyelid 4-5 times per day for 5 days 08/15/21  Yes Jackelyn Poling, DO  hydrOXYzine (ATARAX/VISTARIL) 25 MG tablet Take 25 mg by mouth 3 (three) times daily as needed for anxiety. 04/20/21  Yes [provider]  ibuprofen (ADVIL) 200 MG tablet Take 600 mg by mouth every 6 (six) hours as needed for moderate pain or mild pain.   Yes [provider]  medroxyPROGESTERone (DEPO-PROVERA) 150 MG/ML injection Inject 150 mg into the muscle every 3 (three) months.   Yes [provider]  omeprazole (PRILOSEC) 40 MG capsule Take 1 capsule (40 mg total) by mouth daily. Patient not taking: Reported on 08/16/2021 04/05/21   Dollene Cleveland, DO  promethazine (PHENERGAN) 25 MG tablet Take 1 tablet  (25 mg total) by mouth every 8 (eight) hours as needed (headache). Patient not taking: Reported on 07/14/2020 03/28/20 08/16/20  Gilda Crease, MD  SUMAtriptan (IMITREX) 50 MG tablet Take 1 tablet (50 mg total) by mouth every 2 (two) hours as needed for migraine. May repeat in 2 hours if headache persists or recurs. Patient not taking: Reported on 07/14/2020 01/27/20 08/16/20  Gailen Shelter, PA    Allergies    Patient has no known allergies.  Review of Systems   Review of Systems  Eyes:  Positive for pain.  Respiratory:  Negative for shortness of breath.   Cardiovascular:  Negative for chest pain.  Gastrointestinal:  Negative for abdominal pain.  Neurological:  Negative for headaches.   Physical Exam Updated Vital Signs BP 129/85   Pulse 66   Temp 98.4 F (36.9 C) (Oral)   Resp 16   SpO2 100%   Physical Exam Vitals and nursing note reviewed.  Constitutional:      General: She is not in acute distress.    Appearance: She is well-developed.  HENT:     Head: Normocephalic and atraumatic.  Eyes:     General: Vision grossly intact. No visual field deficit.       Right eye: Discharge present. No foreign body.        Left eye: No foreign body.     Intraocular pressure: Right eye pressure is 17 mmHg. Measurements were taken using a handheld tonometer.    Extraocular Movements: Extraocular movements intact.     Conjunctiva/sclera:     Right eye: Right conjunctiva is injected.     Left eye: Left conjunctiva is not injected.     Comments: No proptosis.  No pain with extraocular movements.  Visual fields intact.  Right-sided conjunctival injection present.  No evidence of periorbital cellulitis. No foreign bodies appreciated. Negative Seidel's sign. No dendrites.  OD 20/25 OS 20/25  OU 20/20  Cardiovascular:     Rate and Rhythm: Normal rate and regular rhythm.     Heart sounds: No murmur heard. Pulmonary:     Effort: Pulmonary effort is normal. No respiratory distress.      Breath sounds: Normal breath sounds.  Abdominal:     Palpations: Abdomen is soft.     Tenderness: There is no abdominal tenderness.  Musculoskeletal:     Cervical back: Neck supple.  Skin:    General: Skin is warm and dry.  Neurological:     Mental Status: She is alert.  ED Results / Procedures / Treatments   Labs (all labs ordered are listed, but only abnormal results are displayed) Labs Reviewed  I-STAT BETA HCG BLOOD, ED ([facility], [facility], [facility] ONLY)    EKG None  Radiology CT Orbits W Contrast  Result Date: 08/16/2021 CLINICAL DATA:  Ocular pain. Additional provided: Patient reports right eye pain, sensitivity to light since yesterday. EXAM: CT ORBITS WITH CONTRAST TECHNIQUE: Multidetector CT images was performed according to the standard protocol following intravenous contrast administration. CONTRAST:  41mL OMNIPAQUE IOHEXOL 350 MG/ML SOLN COMPARISON:  MRI/MRV head 06/30/2021.  Head CT 06/30/2021. FINDINGS: Orbits: The globes are normal in size and contour. The extraocular muscles and optic nerve sheath complexes are symmetric and unremarkable. No orbital mass or appreciable inflammatory changes. Visible paranasal sinuses: Redemonstrated 6 mm osteoma within the inferior right frontal sinus. The paranasal sinuses are otherwise normally aerated. Soft tissues: Unremarkable. Osseous: No acute bony abnormality or aggressive osseous lesion. Limited intracranial: No evidence of acute intracranial multi within the field-of-view. Other: Rightward deviation of the bony nasal septum. IMPRESSION: Unremarkable CT appearance of the orbits. Redemonstrated 6 mm osteoma within the inferior right frontal sinus. Electronically Signed   By: Jackey Loge D.O.   On: 08/16/2021 13:47    Procedures Procedures   Medications Ordered in ED Medications  fluorescein ophthalmic strip 1 strip (1 strip Right Eye Given 08/16/21 1014)  tetracaine (PONTOCAINE) 0.5 % ophthalmic solution 2 drop (2 drops Right Eye Given  08/16/21 1015)  iohexol (OMNIPAQUE) 350 MG/ML injection 75 mL (60 mLs Intravenous Contrast Given 08/16/21 1323)    ED Course  I have reviewed the triage vital signs and the nursing notes.  Pertinent labs & imaging results that were available during my care of the patient were reviewed by me and considered in my medical decision making (see chart for details).    MDM Rules/Calculators/A&P                           The patient presents with right eye pain and some photophobia for 2 days.  She took to the sun while being outside 2 days ago and felt that her symptoms slowly developed throughout the day.  She endorses sharp pain with redness throughout her eye.  She did hit her head 1 day before but did not hit her eye.  She is not a contact lens wearer.  She denies any blurry vision or vision loss.  On arrival, the patient was afebrile, hemodynamically stable.  She has no vision loss, visual acuity tested in both eyes.  No visual field deficit.  Fluorescein staining was conducted with no evidence of corneal abrasion or ulceration.  The patient had no pain with extraocular movements.  No clear proptosis.  She does endorse pain behind her eye with minimal to no swelling, no evidence of periorbital cellulitis.  Lower concern for orbital cellulitis.  Symptoms are most consistent with bacterial conjunctivitis.  Intraocular pressure measured to be normal.  Low concern for acute angle-closure glaucoma.  Given the patient's persistent pain and discomfort, CT of the orbits was obtained to rule out orbital cellulitis and resulted negative.  Overall findings consistent with bacterial conjunctivitis.  Advised continued erythromycin ointment treatment and close follow-up with her PCP.  Final Clinical Impression(s) / ED Diagnoses Final diagnoses:  Acute bacterial conjunctivitis of right eye    Rx / DC Orders ED Discharge Orders     None  Ernie Avena, MD 08/16/21 [phone]

## 2021-09-08 ENCOUNTER — Other Ambulatory Visit: Payer: Self-pay

## 2021-09-08 ENCOUNTER — Encounter (HOSPITAL_COMMUNITY): Payer: Self-pay

## 2021-09-08 ENCOUNTER — Emergency Department (HOSPITAL_COMMUNITY): Payer: BC Managed Care – PPO

## 2021-09-08 ENCOUNTER — Emergency Department (HOSPITAL_COMMUNITY)
Admission: EM | Admit: 2021-09-08 | Discharge: 2021-09-08 | Disposition: A | Discharge status: Home / Self Care | Payer: BC Managed Care – PPO | Attending: Emergency Medicine | Admitting: Emergency Medicine

## 2021-09-08 DIAGNOSIS — R457 State of emotional shock and stress, unspecified: Secondary | ICD-10-CM | POA: Diagnosis not present

## 2021-09-08 DIAGNOSIS — R0789 Other chest pain: Secondary | ICD-10-CM | POA: Insufficient documentation

## 2021-09-08 DIAGNOSIS — R61 Generalized hyperhidrosis: Secondary | ICD-10-CM | POA: Insufficient documentation

## 2021-09-08 DIAGNOSIS — R079 Chest pain, unspecified: Secondary | ICD-10-CM | POA: Diagnosis not present

## 2021-09-08 LAB — CBC
HCT: 37.4 % (ref 36.0–46.0)
Hemoglobin: 12.6 g/dL (ref 12.0–15.0)
MCH: 30.7 pg (ref 26.0–34.0)
MCHC: 33.7 g/dL (ref 30.0–36.0)
MCV: 91 fL (ref 80.0–100.0)
Platelets: 256 10*3/uL (ref 150–400)
RBC: 4.11 MIL/uL (ref 3.87–5.11)
RDW: 12.7 % (ref 11.5–15.5)
WBC: 6.1 10*3/uL (ref 4.0–10.5)
nRBC: 0 % (ref 0.0–0.2)

## 2021-09-08 LAB — BASIC METABOLIC PANEL
Anion gap: 8 (ref 5–15)
BUN: 10 mg/dL (ref 6–20)
CO2: 22 mmol/L (ref 22–32)
Calcium: 9.8 mg/dL (ref 8.9–10.3)
Chloride: 108 mmol/L (ref 98–111)
Creatinine, Ser: 0.74 mg/dL (ref 0.44–1.00)
GFR, Estimated: >60 mL/min (ref >60)
Glucose, Bld: 96 mg/dL (ref 70–99)
Potassium: 3.6 mmol/L (ref 3.5–5.1)
Sodium: 138 mmol/L (ref 135–145)

## 2021-09-08 LAB — I-STAT BETA HCG BLOOD, ED (MC, WL, AP ONLY): I-stat hCG, quantitative: <5 m[IU]/mL (ref <5)

## 2021-09-08 LAB — TROPONIN I (HIGH SENSITIVITY): Troponin I (High Sensitivity): <2 ng/L (ref <18)

## 2021-09-08 LAB — LIPASE, BLOOD: Lipase: 26 U/L (ref 11–51)

## 2021-09-08 MED ORDER — PANTOPRAZOLE SODIUM 20 MG PO TBEC: 20.0000 mg | DELAYED_RELEASE_TABLET | Freq: Two times a day (BID) | ORAL | 0 refills | Status: DC

## 2021-09-08 MED ORDER — NAPROXEN 500 MG PO TABS: 500.0000 mg | ORAL_TABLET | Freq: Two times a day (BID) | ORAL | 0 refills | Status: DC

## 2021-09-08 MED ORDER — ALUM & MAG HYDROXIDE-SIMETH 200-200-20 MG/5ML PO SUSP
30.0000 mL | Freq: Once | ORAL | Status: AC
  Administered 2021-09-08: 30 mL via ORAL
  Filled 2021-09-08: qty 30

## 2021-09-08 NOTE — ED Provider Notes (Signed)
Emergency Medicine Provider Triage Evaluation Note  Katherine Lindsey , a 35 y.o. female  was evaluated in triage.  Pt complains of left-sided chest pain started yesterday, came on suddenly, describes as a burning-like sensation, will radiate to her left arm, no associate shortness of breath nausea vomiting become diaphoretic.  No significant tach history.  Denies p.o or change in vision make the pain worse or better..  Review of Systems  Positive: Chest pain, left arm pain Negative: Shortness of breath, abdominal pain  Physical Exam  BP 139/87 (BP Location: Right Arm)   Pulse 74   Temp 98.5 F (36.9 C) (Oral)   Resp 16   SpO2 100%  Gen:   Awake, no distress   Resp:  Normal effort  MSK:   Moves extremities without difficulty  Other:    Medical Decision Making  Medically screening exam initiated at 11:12 AM.  Appropriate orders placed.  MAGIE CIAMPA was informed that the remainder of the evaluation will be completed by another provider, this initial triage assessment does not replace that evaluation, and the importance of remaining in the ED until their evaluation is complete.  Patient presents with left-sided chest pain labwork imaging been ordered, patient need further evaluation.   Carroll Sage, PA-C 09/08/21 1114    Linwood Dibbles, MD 09/10/21 [phone]

## 2021-09-08 NOTE — ED Triage Notes (Signed)
Pt arrived via POV, c/o left sided chest pain, radiating to left arm since yesterday. Non reproducible.

## 2021-09-08 NOTE — ED Provider Notes (Signed)
[facility]-EMERGENCY DEPT Provider Note   CSN: [id number] Arrival date & time: 09/08/21  1024     History Chief Complaint  Patient presents with   Chest Pain    Katherine Lindsey is a 35 y.o. female.   Chest Pain  HPI: A 35 year old patient presents for evaluation of chest pain. Initial onset of pain was more than 6 hours ago. The patient's chest pain is not worse with exertion. The patient reports some diaphoresis. The patient's chest pain is middle- or left-sided, is not well-localized, is not described as heaviness/pressure/tightness, is not sharp and does not radiate to the arms/jaw/neck. The patient does not complain of nausea. The patient has no history of stroke, has no history of peripheral artery disease, has not smoked in the past 90 days, denies any history of treated diabetes, has no relevant family history of coronary artery disease (first degree relative at less than age 81), is not hypertensive, has no history of hypercholesterolemia and does not have an elevated BMI (>=30).  Patient states is a burning discomfort.  It does go towards her arm as well as down her abdomen and also into her legs.  She is not having any numbness or weakness.  No fevers or chills. Past Medical History:  Diagnosis Date   Anxiety    Crohn's disease in remission [facility])    Hasn't had symptoms since 2004   Gallstones    Hammertoe 09/21/2014   History of low transverse cesarean section 11/28/2015   VBAC, delivered 03/19/2016    Patient Active Problem List   Diagnosis Date Noted   Bacterial conjunctivitis 08/15/2021   Numbness and tingling in both hands 05/09/2021   Belching 04/13/2021   Gassiness 04/07/2021   Functional dyspepsia 03/20/2021   Gastroesophageal reflux disease 08/29/2020   Atypical chest pain 08/17/2020   Nonintractable episodic headache 01/06/2020   Anxiety 07/15/2019   Hidradenitis suppurativa 07/15/2019   High risk HPV infection 02/07/2017    Vaginal spotting 10/17/2012    Past Surgical History:  Procedure Laterality Date   CESAREAN SECTION N/A 06/16/2013   Procedure: CESAREAN SECTION;  Surgeon: Reva Bores, MD;  Location: [facility] ORS;  Service: Obstetrics;  Laterality: N/A;   CHOLECYSTECTOMY  08/28/2012   Procedure: LAPAROSCOPIC CHOLECYSTECTOMY WITH INTRAOPERATIVE CHOLANGIOGRAM;  Surgeon: [name], DO;  Location: [facility] OR;  Service: General;  Laterality: N/A;   INDUCED ABORTION  02/2012     OB History     Gravida  4   Para  3   Term  3   Preterm      AB  1   Living  3      SAB  0   IAB  1   Ectopic      Multiple  0   Live Births  3        Obstetric Comments  2014 LTCS with extension of hysterotomy inferiorly         Family History  Problem Relation Age of Onset   Other Neg Hx    Diabetes Neg Hx    Heart disease Neg Hx    Hyperlipidemia Neg Hx    Hypertension Neg Hx    Stroke Neg Hx    Colon cancer Neg Hx    Esophageal cancer Neg Hx    Pancreatic cancer Neg Hx    Stomach cancer Neg Hx     Social History   Tobacco Use   Smoking status: Never   Smokeless tobacco: Never  Vaping Use   Vaping Use: Never used  Substance Use Topics   Alcohol use: Yes    Alcohol/week: 1.0 standard drink    Types: 1 Glasses of wine per week   Drug use: No    Home Medications Prior to Admission medications   Medication Sig Start Date End Date Taking? Authorizing Provider  hydrOXYzine (ATARAX/VISTARIL) 25 MG tablet Take 25 mg by mouth 3 (three) times daily as needed for anxiety. 04/20/21  Yes [provider]  medroxyPROGESTERone (DEPO-PROVERA) 150 MG/ML injection Inject 150 mg into the muscle every 3 (three) months.   Yes [provider]  naproxen (NAPROSYN) 500 MG tablet Take 1 tablet (500 mg total) by mouth 2 (two) times daily with a meal. As needed for pain 09/08/21  Yes Linwood Dibbles, MD  pantoprazole (PROTONIX) 20 MG tablet Take 1 tablet (20 mg total) by mouth 2 (two) times daily for 10  days. 09/08/21 09/18/21 Yes Linwood Dibbles, MD  erythromycin ophthalmic ointment Place 1 application into the right eye 4 (four) times daily. Use a small amount on the lower eyelid 4-5 times per day for 5 days Patient not taking: Reported on 09/08/2021 08/15/21   Jackelyn Poling, DO  omeprazole (PRILOSEC) 40 MG capsule Take 1 capsule (40 mg total) by mouth daily. Patient not taking: No sig reported 04/05/21   Dollene Cleveland, DO  promethazine (PHENERGAN) 25 MG tablet Take 1 tablet (25 mg total) by mouth every 8 (eight) hours as needed (headache). Patient not taking: Reported on 07/14/2020 03/28/20 08/16/20  Gilda Crease, MD  SUMAtriptan (IMITREX) 50 MG tablet Take 1 tablet (50 mg total) by mouth every 2 (two) hours as needed for migraine. May repeat in 2 hours if headache persists or recurs. Patient not taking: Reported on 07/14/2020 01/27/20 08/16/20  Gailen Shelter, PA    Allergies    Patient has no known allergies.  Review of Systems   Review of Systems  Cardiovascular:  Positive for chest pain.  All other systems reviewed and are negative.  Physical Exam Updated Vital Signs BP 129/79   Pulse 68   Temp 98.5 F (36.9 C) (Oral)   Resp 17   SpO2 100%   Physical Exam Vitals and nursing note reviewed.  Constitutional:      General: She is not in acute distress.    Appearance: She is well-developed.  HENT:     Head: Normocephalic and atraumatic.     Right Ear: External ear normal.     Left Ear: External ear normal.  Eyes:     General: No scleral icterus.       Right eye: No discharge.        Left eye: No discharge.     Conjunctiva/sclera: Conjunctivae normal.  Neck:     Trachea: No tracheal deviation.  Cardiovascular:     Rate and Rhythm: Normal rate and regular rhythm.  Pulmonary:     Effort: Pulmonary effort is normal. No respiratory distress.     Breath sounds: Normal breath sounds. No stridor. No wheezing or rales.  Abdominal:     General: Bowel sounds are normal.  There is no distension.     Palpations: Abdomen is soft.     Tenderness: There is no abdominal tenderness. There is no guarding or rebound.  Musculoskeletal:        General: No tenderness or deformity.     Cervical back: Neck supple.  Skin:    General: Skin is warm and dry.  Findings: No rash.  Neurological:     General: No focal deficit present.     Mental Status: She is alert.     Cranial Nerves: No cranial nerve deficit (no facial droop, extraocular movements intact, no slurred speech).     Sensory: No sensory deficit.     Motor: No abnormal muscle tone or seizure activity.     Coordination: Coordination normal.  Psychiatric:        Mood and Affect: Mood normal.    ED Results / Procedures / Treatments   Labs (all labs ordered are listed, but only abnormal results are displayed) Labs Reviewed  BASIC METABOLIC PANEL  CBC  LIPASE, BLOOD  I-STAT BETA HCG BLOOD, ED ([facility], [facility], [facility] ONLY)  TROPONIN I (HIGH SENSITIVITY)    EKG EKG Interpretation  Date/Time:  Friday September 08 2021 10:31:06 EDT Ventricular Rate:  68 PR Interval:  149 QRS Duration: 77 QT Interval:  375 QTC Calculation: 399 R Axis:   80 Text Interpretation: Sinus rhythm No significant change since last tracing Confirmed by Linwood Dibbles [phone]) on 09/08/2021 11:42:02 AM  Radiology DG Chest 2 View  Result Date: 09/08/2021 CLINICAL DATA:  Left chest pain EXAM: CHEST - 2 VIEW COMPARISON:  06/30/2021 chest radiograph. FINDINGS: Stable cardiomediastinal silhouette with normal heart size. No pneumothorax. No pleural effusion. Lungs appear clear, with no acute consolidative airspace disease and no pulmonary edema. IMPRESSION: No active cardiopulmonary disease. Electronically Signed   By: Delbert Phenix M.D.   On: 09/08/2021 11:23    Procedures Procedures   Medications Ordered in ED Medications  alum & mag hydroxide-simeth (MAALOX/MYLANTA) 200-200-20 MG/5ML suspension 30 mL (30 mLs Oral Given 09/08/21 1220)    ED  Course  I have reviewed the triage vital signs and the nursing notes.  Pertinent labs & imaging results that were available during my care of the patient were reviewed by me and considered in my medical decision making (see chart for details).  Clinical Course as of 09/08/21 1306  Fri Sep 08, 2021  1238 Troponin is normal.  CBC metabolic panel is normal.  Chest x-ray without acute findings.  Lipase is normal [JK]    Clinical Course User Index [JK] Linwood Dibbles, MD   MDM Rules/Calculators/A&P HEAR Score: 1                         Chest pain atypical for cardiac etiology.  Low risk heart score.  Troponin is normal.  I doubt symptoms related to acute Neri syndrome.  Patient is low risk for PE.  PERC negative.  No pneumonia or pneumothorax on x-ray.  Patient describes some symptoms suggestive of possible gastroesophageal reflux.  We will try course of antacids.  We will also try course of NSAIDs.  Recommend outpatient follow-up with PCP. Final Clinical Impression(s) / ED Diagnoses Final diagnoses:  Chest pain with low risk for cardiac etiology    Rx / DC Orders ED Discharge Orders          Ordered    pantoprazole (PROTONIX) 20 MG tablet  2 times daily        09/08/21 1304    naproxen (NAPROSYN) 500 MG tablet  2 times daily with meals        09/08/21 1304             Linwood Dibbles, MD 09/08/21 1306

## 2021-09-08 NOTE — ED Notes (Signed)
Pt d/c home per MD order. Discharge summary reviewed with pt, pt verbalizes understanding. No s/s of acute distress noted at discharge. Ambulatory off unit.  

## 2021-09-08 NOTE — Discharge Instructions (Addendum)
Take the medications as prescribed.  Follow-up with your primary doctor to be rechecked next week

## 2021-10-02 ENCOUNTER — Telehealth: Payer: Self-pay | Admitting: Family Medicine

## 2021-10-02 DIAGNOSIS — R079 Chest pain, unspecified: Secondary | ICD-10-CM | POA: Diagnosis not present

## 2021-10-02 DIAGNOSIS — R0789 Other chest pain: Secondary | ICD-10-CM | POA: Diagnosis not present

## 2021-10-02 NOTE — Telephone Encounter (Signed)
**  After Hours/ Emergency Line Call**  Received a page to call Katherine Lindsey.  Pt states she feels left sided numbness in her chest that radiates into her neck.  Denying nausea, vomiting, sweating, shortness of breath, extremity numbness or tingling. She tried her GERD medications without relief. States she has been seen by GI and evaluated in the ED previously for her chest discomfort. Recommended that pt be evaluated in the ED to rule out acute coronary syndrome.  Will forward to PCP.  [name], DO PGY-2, [facility] 10/02/2021 1:42 AM

## 2021-10-16 ENCOUNTER — Ambulatory Visit (INDEPENDENT_AMBULATORY_CARE_PROVIDER_SITE_OTHER): Payer: BC Managed Care – PPO | Admitting: Family Medicine

## 2021-10-16 DIAGNOSIS — Z91199 Patient's noncompliance with other medical treatment and regimen due to unspecified reason: Secondary | ICD-10-CM

## 2021-10-16 NOTE — Progress Notes (Signed)
No show for appointment. Will send letter.   Modest Draeger, MD  

## 2021-10-17 ENCOUNTER — Ambulatory Visit: Payer: BC Managed Care – PPO

## 2021-10-27 ENCOUNTER — Other Ambulatory Visit: Payer: Self-pay

## 2021-10-27 ENCOUNTER — Ambulatory Visit (INDEPENDENT_AMBULATORY_CARE_PROVIDER_SITE_OTHER): Payer: BC Managed Care – PPO | Admitting: Family

## 2021-10-27 ENCOUNTER — Other Ambulatory Visit (HOSPITAL_COMMUNITY)
Admission: RE | Admit: 2021-10-27 | Discharge: 2021-10-27 | Disposition: A | Discharge status: Home / Self Care | Payer: BC Managed Care – PPO | Source: Ambulatory Visit | Attending: Family | Admitting: Family

## 2021-10-27 ENCOUNTER — Encounter: Payer: Self-pay | Admitting: Family

## 2021-10-27 ENCOUNTER — Other Ambulatory Visit: Payer: Self-pay | Admitting: Family

## 2021-10-27 VITALS — BP 131/80 | HR 70 | Ht 68.0 in | Wt 173.4 lb

## 2021-10-27 DIAGNOSIS — N6320 Unspecified lump in the left breast, unspecified quadrant: Secondary | ICD-10-CM

## 2021-10-27 DIAGNOSIS — Z01419 Encounter for gynecological examination (general) (routine) without abnormal findings: Secondary | ICD-10-CM | POA: Insufficient documentation

## 2021-10-27 DIAGNOSIS — Z566 Other physical and mental strain related to work: Secondary | ICD-10-CM

## 2021-10-27 DIAGNOSIS — N6323 Unspecified lump in the left breast, lower outer quadrant: Secondary | ICD-10-CM

## 2021-10-27 DIAGNOSIS — Z3202 Encounter for pregnancy test, result negative: Secondary | ICD-10-CM

## 2021-10-27 DIAGNOSIS — Z3042 Encounter for surveillance of injectable contraceptive: Secondary | ICD-10-CM | POA: Diagnosis not present

## 2021-10-27 DIAGNOSIS — N6311 Unspecified lump in the right breast, upper outer quadrant: Secondary | ICD-10-CM

## 2021-10-27 LAB — POCT PREGNANCY, URINE: Preg Test, Ur: NEGATIVE

## 2021-10-27 MED ORDER — MEDROXYPROGESTERONE ACETATE 150 MG/ML IM SUSP
150.0000 mg | Freq: Once | INTRAMUSCULAR | Status: AC
  Administered 2021-10-27: 150 mg via INTRAMUSCULAR

## 2021-10-27 NOTE — Progress Notes (Signed)
SUBJECTIVE:  35 y.o. female for annual routine Pap and checkup.  Pt reports being sexually active with partner x 5 year.  Desires STI screening.  Also reports feeling "something" on left breast/chest that is sometimes painful.  +radiation to left arm, has been seen in ED for this in the past with negative results.  No pain at this time.  Currently works as Air cabin crew.  Rates stress between 9-10. Also in school for Entrepreneurship.  Desires to have a staffing agency.  Here with daughter.        Current Outpatient Medications  Medication Sig Dispense Refill   medroxyPROGESTERone (DEPO-PROVERA) 150 MG/ML injection Inject 150 mg into the muscle every 3 (three) months.     erythromycin ophthalmic ointment Place 1 application into the right eye 4 (four) times daily. Use a small amount on the lower eyelid 4-5 times per day for 5 days (Patient not taking: No sig reported) 3.5 g 0   hydrOXYzine (ATARAX/VISTARIL) 25 MG tablet Take 25 mg by mouth 3 (three) times daily as needed for anxiety. (Patient not taking: Reported on 10/27/2021)     naproxen (NAPROSYN) 500 MG tablet Take 1 tablet (500 mg total) by mouth 2 (two) times daily with a meal. As needed for pain (Patient not taking: Reported on 10/27/2021) 20 tablet 0   omeprazole (PRILOSEC) 40 MG capsule Take 1 capsule (40 mg total) by mouth daily. (Patient not taking: No sig reported) 60 capsule 2   pantoprazole (PROTONIX) 20 MG tablet Take 1 tablet (20 mg total) by mouth 2 (two) times daily for 10 days. 20 tablet 0   No current facility-administered medications for this visit.   Allergies: Patient has no known allergies.  No LMP recorded. Patient has had an injection.  ROS:  Feeling well. No dyspnea or chest pain on exertion.  No abdominal pain, change in bowel habits, black or bloody stools.  No urinary tract symptoms. GYN ROS: normal menses, no abnormal bleeding, pelvic pain or discharge, no side effects of hormonal medications, no vaginal bleeding.  No neurological complaints.  Current stress level rated a 9/10.  Predominate cause of stress is work related.  Current self-care methods are none at this time; interested in integrated behavioral health.    OBJECTIVE:  BP 131/80   Pulse 70   Ht 5\' 8"  (1.727 m)   Wt 173 lb 6.4 oz (78.7 kg)   BMI 26.37 kg/m  General appearance: alert, cooperative and appears stated age Head: Normocephalic, without obvious abnormality, atraumatic Neck: no adenopathy, no carotid bruit, no JVD, supple, symmetrical, trachea midline and thyroid not enlarged, symmetric, no tenderness/mass/nodules Lungs: clear to auscultation bilaterally Breasts: normal appearance, + semisolid mass at 7'oclock on left breast, mild tenderness, No nipple retraction or dimpling, No nipple discharge or bleeding, No axillary or supraclavicular adenopathy, Normal to palpation without dominant masses, Taught monthly breast self examination Heart: regular rate and rhythm, S1, S2 normal, no murmur, click, rub or gallop Abdomen: soft, non-tender; bowel sounds normal; no masses,  no organomegaly Pelvic: cervix normal in appearance, external genitalia normal, no adnexal masses or tenderness, no cervical motion tenderness, rectovaginal septum normal, uterus normal size, shape, and consistency and vagina normal without discharge Skin: Skin color, texture, turgor normal. No rashes or lesions      ASSESSMENT:   Well Woman no contraindication to continue hormonal therapy  2.  Left Breast Mass  3.  Increased Stress   PLAN:  pap smear and STI screening counseled on breast self  exam and osteoporosis Referral to integrated behavioral health Order for left breast ultrasound  return annually or prn  Bronson Curb, Kae Heller, CNM

## 2021-10-30 NOTE — BH Specialist Note (Signed)
Pt did not arrive to video visit and did not answer the phone; Left HIPPA-compliant message to call back Dyland Panuco from [facility] at  [phone] (Maliyah Willets's office).  ?; left MyChart message for patient.  ? ?

## 2021-11-01 LAB — CYTOLOGY - PAP
Adequacy: ABSENT
Chlamydia: NEGATIVE
Comment: NEGATIVE
Comment: NEGATIVE
Comment: NORMAL
Diagnosis: UNDETERMINED — AB
High risk HPV: NEGATIVE
Neisseria Gonorrhea: NEGATIVE

## 2021-11-03 ENCOUNTER — Ambulatory Visit: Payer: BC Managed Care – PPO | Admitting: Clinical

## 2021-11-03 DIAGNOSIS — Z91199 Patient's noncompliance with other medical treatment and regimen due to unspecified reason: Secondary | ICD-10-CM

## 2021-12-05 ENCOUNTER — Telehealth: Payer: Self-pay | Admitting: Family

## 2021-12-05 NOTE — Telephone Encounter (Signed)
Pt called to inform about abnormal pap smear, ASCUS, with history of high risk HPV.  Plan to inquire advise about needing a colposcopy.

## 2021-12-14 ENCOUNTER — Other Ambulatory Visit: Payer: BC Managed Care – PPO

## 2021-12-26 ENCOUNTER — Emergency Department (HOSPITAL_COMMUNITY): Payer: BC Managed Care – PPO

## 2021-12-26 ENCOUNTER — Encounter (HOSPITAL_COMMUNITY): Payer: Self-pay

## 2021-12-26 ENCOUNTER — Other Ambulatory Visit: Payer: Self-pay

## 2021-12-26 ENCOUNTER — Emergency Department (HOSPITAL_COMMUNITY)
Admission: EM | Admit: 2021-12-26 | Discharge: 2021-12-26 | Disposition: A | Discharge status: Home / Self Care | Payer: BC Managed Care – PPO | Attending: Emergency Medicine | Admitting: Emergency Medicine

## 2021-12-26 DIAGNOSIS — R0789 Other chest pain: Secondary | ICD-10-CM | POA: Diagnosis not present

## 2021-12-26 DIAGNOSIS — J40 Bronchitis, not specified as acute or chronic: Secondary | ICD-10-CM | POA: Diagnosis not present

## 2021-12-26 DIAGNOSIS — R079 Chest pain, unspecified: Secondary | ICD-10-CM | POA: Diagnosis not present

## 2021-12-26 LAB — CBC
HCT: 40.4 % (ref 36.0–46.0)
Hemoglobin: 13.5 g/dL (ref 12.0–15.0)
MCH: 30.3 pg (ref 26.0–34.0)
MCHC: 33.4 g/dL (ref 30.0–36.0)
MCV: 90.8 fL (ref 80.0–100.0)
Platelets: 303 10*3/uL (ref 150–400)
RBC: 4.45 MIL/uL (ref 3.87–5.11)
RDW: 12.5 % (ref 11.5–15.5)
WBC: 7.7 10*3/uL (ref 4.0–10.5)
nRBC: 0 % (ref 0.0–0.2)

## 2021-12-26 LAB — BASIC METABOLIC PANEL
Anion gap: 8 (ref 5–15)
BUN: 11 mg/dL (ref 6–20)
CO2: 23 mmol/L (ref 22–32)
Calcium: 9.3 mg/dL (ref 8.9–10.3)
Chloride: 107 mmol/L (ref 98–111)
Creatinine, Ser: 0.68 mg/dL (ref 0.44–1.00)
GFR, Estimated: >60 mL/min (ref >60)
Glucose, Bld: 95 mg/dL (ref 70–99)
Potassium: 4 mmol/L (ref 3.5–5.1)
Sodium: 138 mmol/L (ref 135–145)

## 2021-12-26 LAB — TROPONIN I (HIGH SENSITIVITY): Troponin I (High Sensitivity): <2 ng/L (ref <18)

## 2021-12-26 LAB — I-STAT BETA HCG BLOOD, ED (MC, WL, AP ONLY): I-stat hCG, quantitative: <5 m[IU]/mL (ref <5)

## 2021-12-26 MED ORDER — NAPROXEN 500 MG PO TABS: 500.0000 mg | ORAL_TABLET | Freq: Two times a day (BID) | ORAL | 0 refills | Status: DC

## 2021-12-26 NOTE — ED Triage Notes (Signed)
Patient c/o mid chest pain that radiates under the left breast area since 12/23/21. Patient states sometimes she "burps" and the pain lessens. Patient states she has intermittent SOB.

## 2021-12-26 NOTE — ED Provider Notes (Signed)
[facility] EMERGENCY DEPARTMENT Provider Note    CSN: [id number] Arrival date & time: 12/26/21 0930  History Chief Complaint  Patient presents with   Chest Pain    Katherine Lindsey is a 36 y.o. female with no significant PMH reports 3 days of pressure/tightness in mid chest radiating under her left breast. Some cough and congestion. No fever or SOB. No nausea or vomiting. Mild worsening with deep breath. Improves some after belching.    Home Medications Prior to Admission medications   Medication Sig Start Date End Date Taking? Authorizing Provider  medroxyPROGESTERone (DEPO-PROVERA) 150 MG/ML injection Inject 150 mg into the muscle every 3 (three) months.   Yes [provider]  erythromycin ophthalmic ointment Place 1 application into the right eye 4 (four) times daily. Use a small amount on the lower eyelid 4-5 times per day for 5 days Patient not taking: Reported on 09/08/2021 08/15/21   Jackelyn Poling, DO  naproxen (NAPROSYN) 500 MG tablet Take 1 tablet (500 mg total) by mouth 2 (two) times daily with a meal. As needed for pain 12/26/21   Pollyann Savoy, MD  pantoprazole (PROTONIX) 20 MG tablet Take 1 tablet (20 mg total) by mouth 2 (two) times daily for 10 days. Patient not taking: Reported on 12/26/2021 09/08/21 09/18/21  Linwood Dibbles, MD  promethazine (PHENERGAN) 25 MG tablet Take 1 tablet (25 mg total) by mouth every 8 (eight) hours as needed (headache). Patient not taking: Reported on 07/14/2020 03/28/20 08/16/20  Gilda Crease, MD  SUMAtriptan (IMITREX) 50 MG tablet Take 1 tablet (50 mg total) by mouth every 2 (two) hours as needed for migraine. May repeat in 2 hours if headache persists or recurs. Patient not taking: Reported on 07/14/2020 01/27/20 08/16/20  Gailen Shelter, PA     Allergies    Patient has no known allergies.   Review of Systems   Review of Systems Please see HPI for pertinent positives and negatives  Physical Exam BP 124/87     Pulse 61    Temp 97.8 F (36.6 C) (Oral)    Resp 10    Ht 5\' 8"  (1.727 m)    Wt 80.3 kg    SpO2 100%    BMI 26.91 kg/m   Physical Exam Vitals and nursing note reviewed.  Constitutional:      Appearance: Normal appearance.  HENT:     Head: Normocephalic and atraumatic.     Nose: Nose normal.     Mouth/Throat:     Mouth: Mucous membranes are moist.  Eyes:     Extraocular Movements: Extraocular movements intact.     Conjunctiva/sclera: Conjunctivae normal.  Cardiovascular:     Rate and Rhythm: Normal rate.  Pulmonary:     Effort: Pulmonary effort is normal.     Breath sounds: Normal breath sounds.  Abdominal:     General: Abdomen is flat.     Palpations: Abdomen is soft.     Tenderness: There is no abdominal tenderness.  Musculoskeletal:        General: No swelling. Normal range of motion.     Cervical back: Neck supple.  Skin:    General: Skin is warm and dry.  Neurological:     General: No focal deficit present.     Mental Status: She is alert.  Psychiatric:        Mood and Affect: Mood normal.    ED Results / Procedures / Treatments   EKG EKG Interpretation  Date/Time:  Tuesday December 26 2021 09:39:18 EST Ventricular Rate:  69 PR Interval:  152 QRS Duration: 78 QT Interval:  394 QTC Calculation: 423 R Axis:   73 Text Interpretation: Sinus rhythm No significant change since last tracing Confirmed by Jacalyn Lefevre [phone]) on 12/26/2021 9:48:17 AM  Procedures Procedures  Medications Ordered in the ED Medications - No data to display  Initial Impression and Plan  Patient here with atypical chest pains. Low risk factor profile with symptoms ongoing for 3 days. Will check labs, EKG and CXR.   ED Course   Clinical Course as of 12/26/21 1138  Tue Dec 26, 2021  1022 CXR is clear [CS]  1034 CBC is normal.  [CS]  1101 BMP and Trop neg. Given duration of symptoms a repeat is not indicated.  [CS]  1137 Patient with atypical pain, normal ED workup. Symptoms could  be from bronchitis as she has had a cough recently. Advised NSAIDs, OTC cough medicine, rest and follow up with PCP. Return to the ED for any other concerns.  [CS]    Clinical Course User Index [CS] Pollyann Savoy, MD     MDM Rules/Calculators/A&P Medical Decision Making Problems Addressed: Atypical chest pain: acute illness or injury Bronchitis: acute illness or injury  Amount and/or Complexity of Data Reviewed Labs: ordered. Decision-making details documented in ED Course. Radiology: ordered and independent interpretation performed. Decision-making details documented in ED Course. ECG/medicine tests: ordered and independent interpretation performed.  Risk Prescription drug management.    Final Clinical Impression(s) / ED Diagnoses Final diagnoses:  Atypical chest pain  Bronchitis    Rx / DC Orders ED Discharge Orders          Ordered    naproxen (NAPROSYN) 500 MG tablet  2 times daily with meals        12/26/21 1138             Pollyann Savoy, MD 12/26/21 1139

## 2021-12-29 ENCOUNTER — Other Ambulatory Visit: Payer: Self-pay

## 2021-12-29 ENCOUNTER — Ambulatory Visit (INDEPENDENT_AMBULATORY_CARE_PROVIDER_SITE_OTHER): Payer: BC Managed Care – PPO | Admitting: Family Medicine

## 2021-12-29 VITALS — BP 119/75 | HR 72 | Ht 68.0 in | Wt 183.1 lb

## 2021-12-29 DIAGNOSIS — R0789 Other chest pain: Secondary | ICD-10-CM | POA: Diagnosis not present

## 2021-12-29 DIAGNOSIS — R011 Cardiac murmur, unspecified: Secondary | ICD-10-CM | POA: Diagnosis not present

## 2021-12-29 NOTE — Progress Notes (Signed)
° ° °  SUBJECTIVE:   CHIEF COMPLAINT / HPI: chest pain  Recently seen in the ED 3 days ago for chest pain.  Work-up including CXR, CBC, troponin negative.  See chart for further details.  Prescribed naproxen.  She was evaluated for chest pain in the ED in September 2022.  Troponin normal, CXR unremarkable.  Felt to be related to GERD.  She had a left breast mass addressed by her OB/GYN November 2022, imaging was ordered but does not appear to have been done. -- Today, patient reports she has had chest pain for at least several months.  She feels a tight sensation in the center of her chest and around her ribs.  Pain is not exertional, appears to be improved by belching.  She states pain used to be worse after drinking alcohol but she has quit drinking alcohol now.  She reports she had an EGD done which was normal and so she was told to stop taking pantoprazole.  She has had panic attacks before but feels this is different.  She has a history of anxiety but states that she has not had issues of anxiety recently.  Previously was on escitalopram but states that this did not help and she felt better when she was off the medication.  She works at a bank.  Does not do any regular physical activity.  Denies smoking and recreational drug use.  PERTINENT  PMH / PSH: HS, Depo for contraception, Crohn's disease in remission  OBJECTIVE:   BP 119/75    Pulse 72    Ht 5\' 8"  (1.727 m)    Wt 183 lb 2 oz (83.1 kg)    SpO2 99%    BMI 27.84 kg/m   General: Alert, NAD Neck: Supple, no carotid bruits CV: RRR, 2/6 systolic murmur best heard in the left upper sternal border Pulm: CTAB, no wheezes or rales Abdomen: Soft, nontender  ASSESSMENT/PLAN:   Atypical chest pain This has been an ongoing issue for at least several months.  Recently worked up in the ED, low likelihood for ACS.  EGD performed in the past year also unremarkable. - check TTE given murmur - recommend trial of naproxen which was prescribed -  consider therapy for possible anxiety component     Zola Button, MD [place]

## 2021-12-29 NOTE — Patient Instructions (Addendum)
It was nice seeing you today!  Try the naproxen for chest pain up to twice a day with food as needed.  Follow-up after heart study (echocardiogram) is done.  Stay well, Zola Button, MD [place] [phone]  --  Make sure to check out at the front desk before you leave today.  Please arrive at least 15 minutes prior to your scheduled appointments.  If you had blood work today, I will send you a MyChart message or a letter if results are normal. Otherwise, I will give you a call.  If you had a referral placed, they will call you to set up an appointment. Please give [place] a call if you don't hear back in the next 2 weeks.  If you need additional refills before your next appointment, please call your pharmacy first.

## 2021-12-29 NOTE — Assessment & Plan Note (Addendum)
This has been an ongoing issue for at least several months.  Recently worked up in the ED, low likelihood for ACS.  EGD performed in the past year also unremarkable. - check TTE given murmur - recommend trial of naproxen which was prescribed - consider therapy for possible anxiety component

## 2022-01-05 ENCOUNTER — Other Ambulatory Visit: Payer: Self-pay

## 2022-01-05 ENCOUNTER — Ambulatory Visit (HOSPITAL_COMMUNITY)
Admission: RE | Admit: 2022-01-05 | Discharge: 2022-01-05 | Disposition: A | Discharge status: Home / Self Care | Payer: BC Managed Care – PPO | Source: Ambulatory Visit | Attending: Family Medicine | Admitting: Family Medicine

## 2022-01-05 DIAGNOSIS — R011 Cardiac murmur, unspecified: Secondary | ICD-10-CM | POA: Insufficient documentation

## 2022-01-05 DIAGNOSIS — K5090 Crohn's disease, unspecified, without complications: Secondary | ICD-10-CM | POA: Insufficient documentation

## 2022-01-05 DIAGNOSIS — R0789 Other chest pain: Secondary | ICD-10-CM | POA: Diagnosis not present

## 2022-01-05 DIAGNOSIS — F419 Anxiety disorder, unspecified: Secondary | ICD-10-CM | POA: Diagnosis not present

## 2022-01-05 LAB — ECHOCARDIOGRAM COMPLETE
Area-P 1/2: 3.6 cm2
S' Lateral: 3.3 cm

## 2022-01-05 NOTE — Progress Notes (Signed)
Echocardiogram 2D Echocardiogram has been performed.  Arlyss Gandy 01/05/2022, 9:09 AM

## 2022-01-07 ENCOUNTER — Telehealth: Payer: Self-pay | Admitting: Family Medicine

## 2022-01-07 NOTE — Telephone Encounter (Signed)
After-hours call Patient called the emergency line to determine if she needs to go to the hospital.  She reports it has been 3 to 4 days since she has had a bowel movement and she feels a very hard solid stool ball in her rectum when she is unable to pass.  She reports that she used a glycerin suppository without any results.  She is also been drinking plenty of water.  She also tried an at-home Fleet enema.  With all of these measures she has not been able to pass this stool ball.  She asks about laxatives and we discussed monitoring and continuing oral hydration versus being evaluated.  She is going to be evaluated in the emergency department so that she can hopefully have some relief from this constipation.

## 2022-01-10 ENCOUNTER — Telehealth: Payer: Self-pay

## 2022-01-10 ENCOUNTER — Other Ambulatory Visit: Payer: Self-pay

## 2022-01-10 ENCOUNTER — Encounter: Payer: Self-pay | Admitting: Family Medicine

## 2022-01-10 ENCOUNTER — Ambulatory Visit (INDEPENDENT_AMBULATORY_CARE_PROVIDER_SITE_OTHER): Payer: BC Managed Care – PPO | Admitting: Family Medicine

## 2022-01-10 VITALS — BP 125/72 | HR 66 | Ht 68.0 in | Wt 181.4 lb

## 2022-01-10 DIAGNOSIS — R0789 Other chest pain: Secondary | ICD-10-CM

## 2022-01-10 DIAGNOSIS — N6323 Unspecified lump in the left breast, lower outer quadrant: Secondary | ICD-10-CM

## 2022-01-10 DIAGNOSIS — K5090 Crohn's disease, unspecified, without complications: Secondary | ICD-10-CM | POA: Insufficient documentation

## 2022-01-10 MED ORDER — HYOSCYAMINE SULFATE 0.125 MG PO TBDP: 0.1250 mg | ORAL_TABLET | Freq: Three times a day (TID) | ORAL | 1 refills | Status: DC | PRN

## 2022-01-10 NOTE — Patient Instructions (Addendum)
It was wonderful to see you today.  Please bring ALL of your medications with you to every visit.   Today we talked about:  - Your OB/GYN has ordered a breast ultrasound- The number to Call [facility] is   [phone] - it is important to follow up on this - As discusssed, your recent heart echocardiogram showed normal function and no signs of heart disease- this is good news! - I would recommend reaching back out to [facility] to schedule an appointment to talk about esophageal spasms as potential cause of your symptoms, their number is [phone] - In case this is related to your esophagus spasming, lets try hyoscyamine 0.125 mg up to three times a day as needed for pain  Our clinic fax number if you need to send paperwork is [phone]  For the muscle tenderness, try taking naproxen twice a day with food for one week, and let's see if this improves your symptoms. Stop after one week.   Thank you for choosing [facility].   Please call [phone] with any questions about today's appointment.  Please be sure to schedule follow up at the front  desk before you leave today.   Burley Saver, MD  Family Medicine

## 2022-01-10 NOTE — Telephone Encounter (Signed)
-----   Message from Loralie Champagne, PA-C sent at 01/10/2022  3:21 PM EST ----- Regarding: RE: Esophageal spasm vs achalasia? Hello Dr. Thompson Grayer.  Yes, she certainly may have a motility issue and we could consider manometry.  I agree with Levsin trial.  I will have our staff reach out to her.  Thank you for getting in touch with [place].  Jozsef Wescoat,  Please reach out the the patient and offer her an appt with Dr. Fuller Plan or an APP (me if I have something available soonish but anyone will work if need be).   Thank you,  Jess   ----- Message ----- From: Lenoria Chime, MD Sent: 01/10/2022  11:23 AM EST To: Laban Emperor Zehr, PA-C Subject: Esophageal spasm vs achalasia?                 Hi Ms Zehr, I hope this message finds you well.  I am the primary care physician that saw Ms. Shaff today.  She has previously seen you all for a history of belching and chest pain suspected to be related to GERD where she had a normal EGD in June 2022.  Today she is describing symptoms that described as a crushing chest pressure sometimes relieved by belching but also described as crushing that radiates from her throat down to her stomach.  She has had a previous cardiac work-up that has been completely benign.  I am wondering about the possibility of esophageal spasm versus achalasia.  I know she has had a normal EGD but I did not know if you all would consider esophageal manometry or barium swallow or other work-up for this?  I am trying her on Levsin to see if this helps.  I have given her your number to reach back out but if you think this could be a possibility it may be helpful to have your office call her to schedule follow-up as well.  Thank you so much for your time and expertise.   Sincerely, Yehuda Savannah

## 2022-01-10 NOTE — Assessment & Plan Note (Signed)
-   Based on patient's complaints and recent normal EKG, troponins, and echo, we discussed that I do not suspect this is cardiac - She does have symptoms suspicious for esophageal spasm versus achalasia with the crushing chest pain brought on and continuous belching, she was given the number to [facility] to call back out and I will send a message as well discussing my concerns- I wonder if a barium swallow or esophageal manometry could be helpful in further aiding diagnosis - We will trial Levsin 0.125 mg 3 times daily as needed as an antispasmodic - Discussed possible component of costochondritis with muscle tenderness, will trial naproxen twice daily with food for 1 week to see if this improves symptoms

## 2022-01-10 NOTE — Telephone Encounter (Signed)
Spoke with pt regarding follow-up, pt is scheduled to see Amy, PA tomorrow 01/11/22 at 2:30 pm.

## 2022-01-10 NOTE — Progress Notes (Signed)
° ° °  SUBJECTIVE:   CHIEF COMPLAINT / HPI:   Atypical chest pain-patient notes started in September 2020 when she started having issues with chest pain it will happen almost daily where it comes on starting in her left back of her throat making it to breathe it feels like a crushing chest pain and will go down to her left upper quadrant.  She notes when she has this chest pain she then gets anxious and worries that she is having a heart attack.  She has been to the ER multiple times and has missed lots of work and wants to get FMLA paperwork filled out.  She had an EGD in June 2022 that was normal.  She saw GI at that time and they had no further recommendations.  She tried naproxen in case of a costochondritis component but only took it 1 day and did not find that it was helpful.  She does not notice any specific triggers including spicy, hot, cold foods she notes that sometimes belching relieves the pressure that she has.  She does note she has some pain when she pushes on her right chest and sometimes this goes down her arm.  She is worried that she could be having a heart attack.  She notes anxiety from this is affecting her work and her time with kids.  Note she was previously noted to have a breast mass with OB/GYN and ultrasound was scheduled.  She notes that she knows she needs to reschedule this.  She denies any current mass, breast pain, breast discharge.  PERTINENT  PMH / PSH: functional dyspepsia, history of left breast mass,   OBJECTIVE:   BP 125/72    Pulse 66    Ht 5\' 8"  (1.727 m)    Wt 181 lb 6.4 oz (82.3 kg)    SpO2 100%    BMI 27.58 kg/m   General: A&O, NAD HEENT: No sign of trauma, EOM grossly intact, neck supple without lymphadenopathy, no masses appreciated, no thyromegaly Cardiac: RRR, no m/r/g, right chest wall diffusely tender to palpation Respiratory: normal WOB GI: Soft, non-distended, mild left upper quadrant tenderness to palpation Extremities: no peripheral  edema. Neuro: Normal gait, moves all four extremities appropriately. Psych: Appropriate mood and affect   ASSESSMENT/PLAN:   Atypical chest pain - Based on patient's complaints and recent normal EKG, troponins, and echo, we discussed that I do not suspect this is cardiac - She does have symptoms suspicious for esophageal spasm versus achalasia with the crushing chest pain brought on and continuous belching, she was given the number to [facility] to call back out and I will send a message as well discussing my concerns- I wonder if a barium swallow or esophageal manometry could be helpful in further aiding diagnosis - We will trial Levsin 0.125 mg 3 times daily as needed as an antispasmodic - Discussed possible component of costochondritis with muscle tenderness, will trial naproxen twice daily with food for 1 week to see if this improves symptoms  Mass of lower outer quadrant of left breast - Patient given the number to call to reschedule her ultrasound and mammogram per her OB, she denies any current mass today, discussed importance of follow-up for this due to the low but possible risk of cancer or other malignancy causing this mass     Lenoria Chime, MD [place]

## 2022-01-10 NOTE — Assessment & Plan Note (Signed)
-   Patient given the number to call to reschedule her ultrasound and mammogram per her OB, she denies any current mass today, discussed importance of follow-up for this due to the low but possible risk of cancer or other malignancy causing this mass

## 2022-01-11 ENCOUNTER — Ambulatory Visit: Payer: BC Managed Care – PPO | Admitting: Physician Assistant

## 2022-01-11 ENCOUNTER — Ambulatory Visit: Payer: BC Managed Care – PPO | Admitting: Family Medicine

## 2022-01-17 ENCOUNTER — Telehealth: Payer: Self-pay

## 2022-01-17 NOTE — Telephone Encounter (Signed)
Patient calls nurse line requesting the status of FMLA. Patient reports she spoke with her employer and they faxed over forms ~1/26. I do not see anything in PCP box. Patient reports Dr. Thompson Grayer was planning to fill them out.   Will forward to [facility] and PCP to see if they have forms. I advised patient to have another copy faxed over in the event we never received.   Patient reports FMLA is due by the end of the week.

## 2022-01-18 NOTE — Telephone Encounter (Signed)
Informed pt of below.Alila Sotero Zimmerman Rumple, CMA  

## 2022-01-18 NOTE — Telephone Encounter (Signed)
Form faxed to appropriate number and a copy was made for batch scanning.  °

## 2022-01-18 NOTE — Telephone Encounter (Signed)
Patient called to check if we had received FMLA forms after asking them to be refaxed. I informed her we have received them and placed them in Dr. Charlyne Mom box.

## 2022-01-26 ENCOUNTER — Ambulatory Visit: Payer: BC Managed Care – PPO

## 2022-01-29 ENCOUNTER — Ambulatory Visit (INDEPENDENT_AMBULATORY_CARE_PROVIDER_SITE_OTHER): Payer: BC Managed Care – PPO | Admitting: *Deleted

## 2022-01-29 ENCOUNTER — Other Ambulatory Visit: Payer: Self-pay

## 2022-01-29 VITALS — BP 128/78 | HR 75 | Ht 68.0 in | Wt 185.8 lb

## 2022-01-29 DIAGNOSIS — Z3042 Encounter for surveillance of injectable contraceptive: Secondary | ICD-10-CM

## 2022-01-29 DIAGNOSIS — Z3202 Encounter for pregnancy test, result negative: Secondary | ICD-10-CM | POA: Diagnosis not present

## 2022-01-29 LAB — POCT PREGNANCY, URINE: Preg Test, Ur: NEGATIVE

## 2022-01-29 MED ORDER — MEDROXYPROGESTERONE ACETATE 150 MG/ML IM SUSP
150.0000 mg | Freq: Once | INTRAMUSCULAR | Status: AC
  Administered 2022-01-29: 150 mg via INTRAMUSCULAR

## 2022-01-29 NOTE — Progress Notes (Signed)
Here for depo-provera . Is 3 days late , last received on 10/27/21. Last intercourse 2 weeks ago without condoms,. Per protocol urine pregnancy test completed and was negative Depo-provera given without complaint. Needs annual exam 10/2022 and pap in 10/2024. Orange Hilligoss,RN

## 2022-02-05 NOTE — Progress Notes (Signed)
Patient was assessed and managed by nursing staff during this encounter. I have reviewed the chart and agree with the documentation and plan. I have also made any necessary editorial changes. ° °Becky Berberian, MD °02/05/2022 8:40 AM  ° °

## 2022-02-05 NOTE — Progress Notes (Signed)
Patient was assessed and managed by nursing staff during this encounter. I have reviewed the chart and agree with the documentation and plan. I have also made any necessary editorial changes. ° °Euline Kimbler, MD °02/05/2022 8:40 AM  ° °

## 2022-02-07 ENCOUNTER — Ambulatory Visit: Payer: BC Managed Care – PPO | Admitting: Gastroenterology

## 2022-02-07 ENCOUNTER — Encounter: Payer: Self-pay | Admitting: Gastroenterology

## 2022-02-07 VITALS — BP 120/62 | HR 64 | Ht 68.0 in | Wt 182.2 lb

## 2022-02-07 DIAGNOSIS — R079 Chest pain, unspecified: Secondary | ICD-10-CM

## 2022-02-07 DIAGNOSIS — K5900 Constipation, unspecified: Secondary | ICD-10-CM

## 2022-02-07 DIAGNOSIS — R140 Abdominal distension (gaseous): Secondary | ICD-10-CM

## 2022-02-07 DIAGNOSIS — R142 Eructation: Secondary | ICD-10-CM | POA: Diagnosis not present

## 2022-02-07 NOTE — Progress Notes (Signed)
° ° °  History of Present Illness: This is a 36 year old female with belching, chest pain, throat burning.  She was evaluated for the similar complaints in 2022 and underwent EGD as below.  See 04/13/2021 office note. Her symptoms did not respond to PPIs or H2 blockers in 2022 and they do not respond to the same medications now.  She was diagnosed with chest wall pain and chest pain symptoms improved significantly with either ibuprofen or Naprosyn.  She has ongoing problems with constipation with a bowel movement about once every week she has significant abdominal bloating and gas.  She takes MiraLAX daily however has not been effective.  She was treated for Crohn's disease around 2005.  Colonoscopy in 2006 shows that her Crohn disease was vastly improved.  She has not been treated for Crohn's disease since that time.   EGD 05/2021 - Normal esophagus. - Normal stomach. - Normal examined duodenum. - No specimens collected.  Current Medications, Allergies, Past Medical History, Past Surgical History, Family History and Social History were reviewed in [employer] record.   Physical Exam: General: Well developed, well nourished, no acute distress Head: Normocephalic and atraumatic Eyes: Sclerae anicteric, EOMI Ears: Normal auditory acuity Mouth: Not examined, mask on during Covid-19 pandemic Lungs: Clear throughout to auscultation Heart: Regular rate and rhythm; no murmurs, rubs or bruits Abdomen: Soft, non tender and non distended. No masses, hepatosplenomegaly or hernias noted. Normal Bowel sounds Rectal: Not done Musculoskeletal: Symmetrical with no gross deformities  Pulses:  Normal pulses noted Extremities: No clubbing, cyanosis, edema or deformities noted Neurological: Alert oriented x 4, grossly nonfocal Psychological:  Alert and cooperative. Anxious.    Assessment and Recommendations:  Chest pain, belching, throat burning.  Symptoms do not respond to acid  suppressants.  EGD was normal.  Chest wall pain is a component of her chest pain.  Chest pain improves with ibuprofen, naprosyn. Anxiety may be contributing to her symptoms.  No evidence of active UGI gastrointestinal disorder.  Constipation, abdominal bloating.  Increase MiraLAX to 2 or 3 times daily titrated for complete bowel movement every day or every other day.  If MiraLAX is not effective add Dulcolax 1 to 2 tablets daily.  Call within the next few weeks if the symptoms are not controlled.  Trial of hyoscyamine for abdominal bloating not relieved with management of constipation. History of Crohn's disease.  Although her current symptoms do not suggest Crohn's disease if her symptoms persist consider further evaluation with blood work, abdominal imaging and colonoscopy.

## 2022-02-07 NOTE — Patient Instructions (Signed)
Increase your Miralax to 2-3 x daily. You can also add Dulcolax 1-2 x daily if your Miralax is not as effective.   Call our office if your symptoms are not better.   The [facility] providers would like to encourage you to use [facility] to communicate with providers for non-urgent requests or questions.  Due to long hold times on the telephone, sending your provider a message by [facility] may be a faster and more efficient way to get a response.  Please allow 48 business hours for a response.  Please remember that this is for non-urgent requests.   Thank you for choosing me and [facility].  Pricilla Riffle. Dagoberto Ligas., MD., [facility]

## 2022-02-12 NOTE — Progress Notes (Unsigned)
°  SUBJECTIVE:   CHIEF COMPLAINT / HPI:   ***  PERTINENT  PMH / PSH: ***  Past Medical History:  Diagnosis Date   Anxiety    Crohn's disease in remission ([place])    Hasn't had symptoms since 2004   Gallstones    Hammertoe 09/21/2014   History of low transverse cesarean section 11/28/2015   VBAC, delivered 03/19/2016    OBJECTIVE:  There were no vitals taken for this visit.  General: NAD, pleasant, able to participate in exam Cardiac: RRR, no murmurs auscultated. Respiratory: CTAB, normal effort, no wheezes, rales or rhonchi Abdomen: soft, non-tender, non-distended, normoactive bowel sounds Extremities: warm and well perfused, no edema or cyanosis. Skin: warm and dry, no rashes noted Neuro: alert, no obvious focal deficits, speech normal Psych: Normal affect and mood  ASSESSMENT/PLAN:  No problem-specific Assessment & Plan notes found for this encounter.   No orders of the defined types were placed in this encounter.  No orders of the defined types were placed in this encounter.  No follow-ups on file. Wells Guiles, DO 02/12/2022, 11:08 PM PGY-***, [facility] {    This will disappear when note is signed, click to select method of visit    :1}

## 2022-02-13 ENCOUNTER — Ambulatory Visit: Payer: BC Managed Care – PPO | Admitting: Student

## 2022-03-08 NOTE — Progress Notes (Signed)
?  SUBJECTIVE:  ? ?CHIEF COMPLAINT / HPI:  ? ?Pt returning for same complaint of chest pain with belching/abdominal bloating and burning esophagus. She still feels bloated and has a full feeling and additional belching all day long. This also presents as chest pressure and will last for a variable period of time. She'll drink water and when she belches is when she gets relief. Nothing makes it better or worse and no changes based on eating different foods.  ? ?Her constipation has improved with using miralax. Stools daily but still notices abdominal bloating. ? ?PERTINENT  PMH / PSH: Crohn's disease in remission ? ?OBJECTIVE:  ?BP 126/82   Pulse 72   Ht 5\' 8"  (1.727 m)   Wt 184 lb 3.2 oz (83.6 kg)   SpO2 100%   BMI 28.01 kg/m?  ? ?General: NAD, pleasant, able to participate in exam ?Cardiac: RRR, no murmurs auscultated. ?Respiratory: CTAB, normal effort, no wheezes, rales or rhonchi ?Abdomen: soft, non-tender, non-distended, normoactive bowel sounds ?MSK: tenderness of left pectoralis ? ?ASSESSMENT/PLAN:  ?Atypical chest pain ?Tenderness with palpation over the muscle, continue to suspect not cardiac in nature. More likely to be MSK in nature although no history of overuse injury or trauma. May benefit from Voltaren gel. Consider lidocaine patch for future. ?Esophageal spasm still potential as patient never trialed hyoscyamine as previously advise. As noted by GI, has been worked up via EGD with unremarkable exam and unresponsive to acid blockers/PPIs. Rx sent for hyoscyamine 0.125mg  PRN. Follow up in 1 month. ?  ?Meds ordered this encounter  ?Medications  ? hyoscyamine (ANASPAZ) disintergrating tablet 0.125 mg  ? diclofenac Sodium (VOLTAREN) 1 % topical gel 2 g  ? ?Return in about 1 month (around 04/09/2022) for belching/chest pressure. ?Wells Guiles, DO ?03/09/2022, 8:54 PM ?PGY-1, [place] ? ?

## 2022-03-09 ENCOUNTER — Other Ambulatory Visit: Payer: Self-pay

## 2022-03-09 ENCOUNTER — Encounter: Payer: Self-pay | Admitting: Student

## 2022-03-09 ENCOUNTER — Ambulatory Visit (INDEPENDENT_AMBULATORY_CARE_PROVIDER_SITE_OTHER): Payer: BC Managed Care – PPO | Admitting: Student

## 2022-03-09 VITALS — BP 126/82 | HR 72 | Ht 68.0 in | Wt 184.2 lb

## 2022-03-09 DIAGNOSIS — R0789 Other chest pain: Secondary | ICD-10-CM | POA: Diagnosis not present

## 2022-03-09 MED ORDER — HYOSCYAMINE SULFATE 0.125 MG PO TBDP: 0.1250 mg | ORAL_TABLET | Freq: Four times a day (QID) | ORAL | Status: DC | PRN

## 2022-03-09 MED ORDER — DICLOFENAC SODIUM 1 % EX GEL: 2.0000 g | Freq: Four times a day (QID) | CUTANEOUS | Status: DC

## 2022-03-09 NOTE — Patient Instructions (Addendum)
It was great to see you today! Thank you for choosing [facility] for your primary care. Katherine Lindsey was seen for belching and left sided chest pressure. ? ?Today we addressed: ?Belching/atypical chest pain: I have prescribed you a couple medications to try as needed for when you are experiencing this atypical chest pressure.  Please return in 1 month if you do not notice any improvements or you may readdress this with your GI doctor. ? ?Meds ordered this encounter  ?Medications  ? hyoscyamine (ANASPAZ) disintergrating tablet 0.125 mg  ? diclofenac Sodium (VOLTAREN) 1 % topical gel 2 g  ? ? ?You should return to our clinic Return in about 1 month (around 04/09/2022) for belching/chest pressure.. ? ?I recommend that you always bring your medications to each appointment as this makes it easy to ensure you are on the correct medications and helps [place] not miss refills when you need them. ? ?Please arrive 15 minutes before your appointment to ensure smooth check in process.  We appreciate your efforts in making this happen. ? ?Take care and seek immediate care sooner if you develop any concerns.  ? ?Thank you for allowing me to participate in your care, ?Wells Guiles, DO ?03/09/2022, 4:43 PM ?PGY-1, [place] ? ? ?

## 2022-03-09 NOTE — Assessment & Plan Note (Addendum)
Tenderness with palpation over the muscle, continue to suspect not cardiac in nature. More likely to be MSK in nature although no history of overuse injury or trauma. May benefit from Voltaren gel. Consider lidocaine patch for future. ?Esophageal spasm still potential as patient never trialed hyoscyamine as previously advise. As noted by GI, has been worked up via EGD with unremarkable exam and unresponsive to acid blockers/PPIs. Rx sent for hyoscyamine 0.125mg  PRN. Follow up in 1 month. ?

## 2022-04-16 ENCOUNTER — Ambulatory Visit (INDEPENDENT_AMBULATORY_CARE_PROVIDER_SITE_OTHER): Payer: BC Managed Care – PPO

## 2022-04-16 VITALS — BP 123/74 | HR 62 | Wt 188.7 lb

## 2022-04-16 DIAGNOSIS — Z3042 Encounter for surveillance of injectable contraceptive: Secondary | ICD-10-CM | POA: Diagnosis not present

## 2022-04-16 MED ORDER — MEDROXYPROGESTERONE ACETATE 150 MG/ML IM SUSP
150.0000 mg | Freq: Once | INTRAMUSCULAR | Status: AC
  Administered 2022-04-16: 150 mg via INTRAMUSCULAR

## 2022-04-16 NOTE — Progress Notes (Addendum)
Karie Kirks here for Depo-Provera Injection. Injection administered without complication. Patient will return in 3 months for next injection between July 17 and July 31. Next annual visit due 10/2022.  ? ?Verdell Carmine, RN ?04/16/2022  5:31 PM  ? ? ? ?Patient was assessed and managed by nursing staff during this encounter. I have reviewed the chart and agree with the documentation and plan. I have also made any necessary editorial changes. ? ?Renee Harder, CNM ?04/17/2022 9:15 AM  ? ?

## 2022-05-22 ENCOUNTER — Encounter: Payer: Self-pay | Admitting: *Deleted

## 2022-05-24 ENCOUNTER — Ambulatory Visit: Payer: BC Managed Care – PPO | Admitting: Podiatry

## 2022-06-22 ENCOUNTER — Encounter: Payer: Self-pay | Admitting: Family Medicine

## 2022-06-22 ENCOUNTER — Ambulatory Visit (INDEPENDENT_AMBULATORY_CARE_PROVIDER_SITE_OTHER): Payer: BC Managed Care – PPO | Admitting: Family Medicine

## 2022-06-22 VITALS — BP 121/77 | HR 63 | Ht 68.0 in | Wt 192.8 lb

## 2022-06-22 DIAGNOSIS — L732 Hidradenitis suppurativa: Secondary | ICD-10-CM

## 2022-06-22 MED ORDER — SILVER SULFADIAZINE 1 % EX CREA
1.0000 | TOPICAL_CREAM | Freq: Every day | CUTANEOUS | 3 refills | Status: DC
Start: 2022-06-22 — End: 2022-10-11

## 2022-06-22 MED ORDER — DOXYCYCLINE HYCLATE 100 MG PO TABS: 100.0000 mg | ORAL_TABLET | Freq: Two times a day (BID) | ORAL | 0 refills | Status: AC

## 2022-06-22 NOTE — Progress Notes (Signed)
    SUBJECTIVE:   CHIEF COMPLAINT / HPI:   Hidradenitis suppurativa: patient has had a boil under her left axilla x1 week. She has used warm water in the shower and hydrogen peroxide, it finally appears to have come to a head today. No fevers, chills, no redness spreading up the arm or surrounding the site of the boil. Patient has a h/o Crohn's and HS, she has not had a flare in about a year, overall mild disease burden.  PERTINENT  PMH / PSH: crohn's disease, HS  OBJECTIVE:   BP 121/77   Pulse 63   Ht 5\' 8"  (1.727 m)   Wt 192 lb 12.8 oz (87.5 kg)   SpO2 100%   BMI 29.32 kg/m   Nursing note and vitals reviewed GEN: age appropriate, AAW, resting comfortably in chair, NAD, WNWD HEENT: NCAT. PERRLA. Sclera without injection or icterus. MMM.  Neck: Supple. No LAD Skin: 2cm furuncle in left axilla Neuro: AOx3  Ext: no edema Psych: Pleasant and appropriate   ASSESSMENT/PLAN:   Hidradenitis suppurativa of left axilla Boil came to head and able to express with warm compress alone. Offered patient I&D, she declined. Recommend continuing warm compresses every 4-6 hours at home to ensure all pustulant drainage is complete. Patient is UTD with depoprovera for birth control. - doxycycline 100 mg BID x7 days - Silvadene cream BID x1-2 weeks     , MD [facility]

## 2022-06-22 NOTE — Patient Instructions (Signed)
It was a pleasure to see you today!  Keep putting a warm compress on the area every 4 hours and try to express all of the pustulant drainage Take doxycycline 100 mg twice a day for 7 days You can use silvadene cream twice a day to help heal the area If you have a fever (temp above 100.4*F), this area painfully enlarges, you have redness spreading up the arm, please return for evaluation    Be Well,  Dr. Leary Roca

## 2022-06-25 DIAGNOSIS — L732 Hidradenitis suppurativa: Secondary | ICD-10-CM | POA: Insufficient documentation

## 2022-06-25 NOTE — Assessment & Plan Note (Signed)
Boil came to head and able to express with warm compress alone. Offered patient I&D, she declined. Recommend continuing warm compresses every 4-6 hours at home to ensure all pustulant drainage is complete. Patient is UTD with depoprovera for birth control. - doxycycline 100 mg BID x7 days - Silvadene cream BID x1-2 weeks

## 2022-06-25 NOTE — Assessment & Plan Note (Signed)
>>  ASSESSMENT AND PLAN FOR HIDRADENITIS SUPPURATIVA OF LEFT AXILLA WRITTEN ON 06/25/2022  2:49 PM BY MAHONEY, CAITLIN, MD  Boil came to head and able to express with warm compress alone. Offered patient I&D, she declined. Recommend continuing warm compresses every 4-6 hours at home to ensure all pustulant drainage is complete. Patient is UTD with depoprovera for birth control. - doxycycline 100 mg BID x7 days - Silvadene cream BID x1-2 weeks

## 2022-07-03 ENCOUNTER — Ambulatory Visit: Payer: BC Managed Care – PPO

## 2022-07-10 ENCOUNTER — Ambulatory Visit: Payer: BC Managed Care – PPO

## 2022-07-13 ENCOUNTER — Ambulatory Visit: Payer: BC Managed Care – PPO

## 2022-07-16 ENCOUNTER — Ambulatory Visit (INDEPENDENT_AMBULATORY_CARE_PROVIDER_SITE_OTHER): Payer: BC Managed Care – PPO

## 2022-07-16 ENCOUNTER — Other Ambulatory Visit: Payer: Self-pay

## 2022-07-16 VITALS — BP 125/86 | HR 65 | Wt 191.4 lb

## 2022-07-16 DIAGNOSIS — Z3042 Encounter for surveillance of injectable contraceptive: Secondary | ICD-10-CM | POA: Diagnosis not present

## 2022-07-16 MED ORDER — MEDROXYPROGESTERONE ACETATE 150 MG/ML IM SUSP
150.0000 mg | Freq: Once | INTRAMUSCULAR | Status: AC
  Administered 2022-07-16: 150 mg via INTRAMUSCULAR

## 2022-07-16 NOTE — Progress Notes (Signed)
Katherine Lindsey here for Depo-Provera Injection. Injection administered without complication. Patient will return in 3 months for next injection between October 3rd and October 17th. Next annual visit due November 2023.   Cline Crock, RN 07/16/2022

## 2022-07-16 NOTE — Progress Notes (Signed)
Addend previous note due to incorrect information:  Patient will return in 3 months for next injection between October 16 and October 30.   Alesia Richards, RN 07/16/22

## 2022-08-31 ENCOUNTER — Ambulatory Visit (INDEPENDENT_AMBULATORY_CARE_PROVIDER_SITE_OTHER): Payer: BC Managed Care – PPO | Admitting: Family Medicine

## 2022-08-31 ENCOUNTER — Encounter: Payer: Self-pay | Admitting: Family Medicine

## 2022-08-31 VITALS — BP 133/85 | HR 80 | Temp 98.5°F | Ht 68.0 in | Wt 197.6 lb

## 2022-08-31 DIAGNOSIS — R059 Cough, unspecified: Secondary | ICD-10-CM | POA: Diagnosis not present

## 2022-08-31 DIAGNOSIS — R509 Fever, unspecified: Secondary | ICD-10-CM | POA: Diagnosis not present

## 2022-08-31 DIAGNOSIS — R197 Diarrhea, unspecified: Secondary | ICD-10-CM | POA: Diagnosis not present

## 2022-08-31 MED ORDER — BENZONATATE 100 MG PO CAPS: 100.0000 mg | ORAL_CAPSULE | Freq: Two times a day (BID) | ORAL | 0 refills | Status: DC | PRN

## 2022-08-31 NOTE — Assessment & Plan Note (Signed)
Acute, 2 to 3-day duration.  Likely viral URI.  Considered flu and COVID given her nausea and diarrhea, collected swab.  Expect results in 2 to 3 days, will follow.  No high risk conditions for COVID hospitalization other than BMI of 30; no asthma, COPD, hypertension, or diabetes.  Recommend conservative management at this time, Rx Tessalon Perles.  See AVS for more.  Return precautions given.

## 2022-08-31 NOTE — Progress Notes (Signed)
    SUBJECTIVE:   CHIEF COMPLAINT / HPI:   Cough and congestion Ms. Noy is a pleasant 36 year old woman who presents today with her 2 children for complaint of cough and congestion.  She reports 2 to 3 days of fevers with Tmax 100.6 F, intermittently productive cough, chest tightness, shortness of breath, rhinorrhea, nausea and diarrhea.  Denies vomiting and rash.  At home, she has been trying symptomatic relief with Mucinex, hot fluids, tea with honey, water, lemons.  Not much relief with these.  She has not had any flu or COVID vaccinations.  She is up-to-date on her childhood vaccines.  At home she has her 3 children, her sister, and her nephews.  All children in the house are school-aged.   PERTINENT  PMH / PSH: Hidradenitis suppurativa, headache, Crohn's disease, atypical chest pain, anxiety  OBJECTIVE:   BP 133/85   Pulse 80   Temp 98.5 F (36.9 C) (Axillary)   Ht 5' 8"  (1.727 m)   Wt 197 lb 9.6 oz (89.6 kg)   SpO2 100%   BMI 30.04 kg/m    PHQ-9:     08/31/2022    3:08 PM 06/22/2022    2:18 PM 03/09/2022    4:01 PM  Depression screen PHQ 2/9  Decreased Interest 0 1 0  Down, Depressed, Hopeless 0 0 0  PHQ - 2 Score 0 1 0  Altered sleeping 0 1 0  Tired, decreased energy 0 1 0  Change in appetite 0 0 0  Feeling bad or failure about yourself  0 0 0  Trouble concentrating 0 0 0  Moving slowly or fidgety/restless 0 0 0  Suicidal thoughts 0 0 0  PHQ-9 Score 0 3 0    Physical Exam General: Awake, alert, oriented HEENT: PERRL, sclera anicteric, bilateral TM pearly pink and flat, bilateral external auditory canals with minimal cerumen burden, no lesions, nares with clear discharge, oral mucosa pink, moist, without lesion, intact dentition  Lymph: No palpable lymphedema of head or neck Cardiovascular: Regular rate and rhythm, S1 and S2 present, no murmurs auscultated Respiratory: Lung fields clear to auscultation bilaterally  ASSESSMENT/PLAN:   Cough Acute, 2  to 3-day duration.  Likely viral URI.  Considered flu and COVID given her nausea and diarrhea, collected swab.  Expect results in 2 to 3 days, will follow.  No high risk conditions for COVID hospitalization other than BMI of 30; no asthma, COPD, hypertension, or diabetes.  Recommend conservative management at this time, Rx Tessalon Perles.  See AVS for more.  Return precautions given.     Ezequiel Essex, MD [place]

## 2022-08-31 NOTE — Patient Instructions (Addendum)
It was wonderful to see you today. Thank you for allowing me to be a part of your care. Below is a short summary of what we discussed at your visit today:  Cold and flu symptoms Today we took a swab to test for COVID and flu. We are out of rapid flu swabs, so the results will both be "send off" and take a couple days to come back. The results will show up on MyChart. If the results are normal, I will send you a letter or MyChart message. If the results are abnormal, I will give you a call.    Focus on hydration and good nutrition so your body can fight this off. Wear a mask when you are out and about around non-household people.    Please bring all of your medications to every appointment!  If you have any questions or concerns, please do not hesitate to contact us via phone or MyChart message.   Ezequiel Essex, MD

## 2022-09-03 LAB — COVID-19, FLU A+B NAA
Influenza A, NAA: NOT DETECTED
Influenza B, NAA: NOT DETECTED
SARS-CoV-2, NAA: NOT DETECTED

## 2022-10-01 ENCOUNTER — Emergency Department (HOSPITAL_COMMUNITY)
Admission: EM | Admit: 2022-10-01 | Discharge: 2022-10-01 | Disposition: A | Discharge status: Home / Self Care | Payer: BC Managed Care – PPO | Attending: Student | Admitting: Student

## 2022-10-01 ENCOUNTER — Other Ambulatory Visit: Payer: Self-pay

## 2022-10-01 ENCOUNTER — Emergency Department (HOSPITAL_COMMUNITY): Payer: BC Managed Care – PPO

## 2022-10-01 ENCOUNTER — Encounter (HOSPITAL_COMMUNITY): Payer: Self-pay

## 2022-10-01 DIAGNOSIS — R0789 Other chest pain: Secondary | ICD-10-CM | POA: Diagnosis not present

## 2022-10-01 DIAGNOSIS — R7989 Other specified abnormal findings of blood chemistry: Secondary | ICD-10-CM | POA: Diagnosis not present

## 2022-10-01 DIAGNOSIS — R791 Abnormal coagulation profile: Secondary | ICD-10-CM | POA: Diagnosis not present

## 2022-10-01 DIAGNOSIS — M542 Cervicalgia: Secondary | ICD-10-CM | POA: Diagnosis not present

## 2022-10-01 DIAGNOSIS — R079 Chest pain, unspecified: Secondary | ICD-10-CM | POA: Diagnosis not present

## 2022-10-01 LAB — BASIC METABOLIC PANEL
Anion gap: 5 (ref 5–15)
BUN: 13 mg/dL (ref 6–20)
CO2: 22 mmol/L (ref 22–32)
Calcium: 9.2 mg/dL (ref 8.9–10.3)
Chloride: 110 mmol/L (ref 98–111)
Creatinine, Ser: 0.77 mg/dL (ref 0.44–1.00)
GFR, Estimated: >60 mL/min (ref >60)
Glucose, Bld: 105 mg/dL — ABNORMAL HIGH (ref 70–99)
Potassium: 4 mmol/L (ref 3.5–5.1)
Sodium: 137 mmol/L (ref 135–145)

## 2022-10-01 LAB — CBC
HCT: 38.3 % (ref 36.0–46.0)
Hemoglobin: 12.8 g/dL (ref 12.0–15.0)
MCH: 30.5 pg (ref 26.0–34.0)
MCHC: 33.4 g/dL (ref 30.0–36.0)
MCV: 91.2 fL (ref 80.0–100.0)
Platelets: 305 10*3/uL (ref 150–400)
RBC: 4.2 MIL/uL (ref 3.87–5.11)
RDW: 12.7 % (ref 11.5–15.5)
WBC: 7.2 10*3/uL (ref 4.0–10.5)
nRBC: 0 % (ref 0.0–0.2)

## 2022-10-01 LAB — TROPONIN I (HIGH SENSITIVITY)
Troponin I (High Sensitivity): <2 ng/L (ref <18)
Troponin I (High Sensitivity): <2 ng/L (ref <18)

## 2022-10-01 LAB — I-STAT BETA HCG BLOOD, ED (MC, WL, AP ONLY): I-stat hCG, quantitative: <5 m[IU]/mL (ref <5)

## 2022-10-01 LAB — D-DIMER, QUANTITATIVE: D-Dimer, Quant: 0.69 ug/mL-FEU — ABNORMAL HIGH (ref 0.00–0.50)

## 2022-10-01 MED ORDER — DICLOFENAC SODIUM 1 % EX GEL
2.0000 g | Freq: Once | CUTANEOUS | Status: AC
  Administered 2022-10-01: 2 g via TOPICAL
  Filled 2022-10-01: qty 100

## 2022-10-01 MED ORDER — IOHEXOL 350 MG/ML SOLN
75.0000 mL | Freq: Once | INTRAVENOUS | Status: AC | PRN
  Administered 2022-10-01: 75 mL via INTRAVENOUS

## 2022-10-01 NOTE — ED Provider Notes (Addendum)
[facility] DEPT Provider Note   CSN: [id number] Arrival date & time: 10/01/22  6389     History  Chief Complaint  Patient presents with   Chest Pain    Katherine Lindsey is a 36 y.o. female.  Patient with a complaint of the left-sided chest pain that radiates to the left arm and left side of the neck has been going on for 2 days.  No complaint of specific shortness of breath.  Oxygen sats are in the upper 100.  Triage did a D-dimer as well as ordering troponins.  Past medical history significant for Crohn's disease patient is status post gallbladder removal.  Had a C-section in 2014.  Patient does not use tobacco.       Home Medications Prior to Admission medications   Medication Sig Start Date End Date Taking? Authorizing Provider  benzonatate (TESSALON) 100 MG capsule Take 1 capsule (100 mg total) by mouth 2 (two) times daily as needed for cough. 08/31/22   Ezequiel Essex, MD  medroxyPROGESTERone (DEPO-PROVERA) 150 MG/ML injection Inject 150 mg into the muscle every 3 (three) months.    [provider]  silver sulfADIAZINE (SILVADENE) 1 % cream Apply 1 Application topically daily. 06/22/22   Gladys Damme, MD  promethazine (PHENERGAN) 25 MG tablet Take 1 tablet (25 mg total) by mouth every 8 (eight) hours as needed (headache). Patient not taking: Reported on 07/14/2020 03/28/20 08/16/20  Orpah Greek, MD  SUMAtriptan (IMITREX) 50 MG tablet Take 1 tablet (50 mg total) by mouth every 2 (two) hours as needed for migraine. May repeat in 2 hours if headache persists or recurs. Patient not taking: Reported on 07/14/2020 01/27/20 08/16/20  Tedd Sias, PA      Allergies    Patient has no known allergies.    Review of Systems   Review of Systems  Constitutional:  Negative for chills and fever.  HENT:  Negative for ear pain and sore throat.   Eyes:  Negative for pain and visual disturbance.  Respiratory:  Negative for cough and  shortness of breath.   Cardiovascular:  Positive for chest pain. Negative for palpitations.  Gastrointestinal:  Negative for abdominal pain and vomiting.  Genitourinary:  Negative for dysuria and hematuria.  Musculoskeletal:  Negative for arthralgias and back pain.  Skin:  Negative for color change and rash.  Neurological:  Negative for seizures and syncope.  All other systems reviewed and are negative.   Physical Exam Updated Vital Signs BP (!) 141/82   Pulse 66   Temp 98.4 F (36.9 C)   Resp 18   Ht 1.727 m (5' 8" )   Wt 87.5 kg   SpO2 100%   BMI 29.35 kg/m  Physical Exam Vitals and nursing note reviewed.  Constitutional:      General: She is not in acute distress.    Appearance: She is well-developed. She is not ill-appearing.  HENT:     Head: Normocephalic and atraumatic.  Eyes:     Conjunctiva/sclera: Conjunctivae normal.  Cardiovascular:     Rate and Rhythm: Normal rate and regular rhythm.     Heart sounds: No murmur heard. Pulmonary:     Effort: Pulmonary effort is normal. No respiratory distress.     Breath sounds: Normal breath sounds. No decreased breath sounds, wheezing or rales.  Abdominal:     Palpations: Abdomen is soft.     Tenderness: There is no abdominal tenderness. There is no guarding.  Musculoskeletal:  General: No swelling.     Cervical back: Neck supple.  Skin:    General: Skin is warm and dry.     Capillary Refill: Capillary refill takes less than 2 seconds.  Neurological:     Mental Status: She is alert.  Psychiatric:        Mood and Affect: Mood normal.     ED Results / Procedures / Treatments   Labs (all labs ordered are listed, but only abnormal results are displayed) Labs Reviewed  BASIC METABOLIC PANEL - Abnormal; Notable for the following components:      Result Value   Glucose, Bld 105 (*)    All other components within normal limits  D-DIMER, QUANTITATIVE - Abnormal; Notable for the following components:   D-Dimer,  Quant 0.69 (*)    All other components within normal limits  CBC  I-STAT BETA HCG BLOOD, ED ([facility], [facility], [facility] ONLY)  TROPONIN I (HIGH SENSITIVITY)  TROPONIN I (HIGH SENSITIVITY)    EKG EKG Interpretation  Date/Time:  Monday October 01 2022 11:45:20 EDT Ventricular Rate:  65 PR Interval:  153 QRS Duration: 80 QT Interval:  400 QTC Calculation: 416 R Axis:   72 Text Interpretation: Sinus rhythm Confirmed by Kommor, Madison ([id number]) on 10/01/2022 12:54:59 PM  Radiology DG Chest 2 View  Result Date: 10/01/2022 CLINICAL DATA:  Chest pain. EXAM: CHEST - 2 VIEW COMPARISON:  Chest x-ray 12/26/2021. FINDINGS: The heart size and mediastinal contours are within normal limits. Both lungs are clear. No visible pleural effusions or pneumothorax. No acute osseous abnormality. Cholecystectomy clips. IMPRESSION: No active cardiopulmonary disease. Electronically Signed   By: Margaretha Sheffield M.D.   On: 10/01/2022 08:56    Procedures Procedures    Medications Ordered in ED Medications  diclofenac Sodium (VOLTAREN) 1 % topical gel 2 g (2 g Topical Given 10/01/22 1354)    ED Course/ Medical Decision Making/ A&P                           Medical Decision Making Amount and/or Complexity of Data Reviewed Labs: ordered. Radiology: ordered.  Risk Prescription drug management.   Patient's work-up for the chest pain troponins x2 without any significant changes.  Basic metabolic panel normal including renal function.  CBC normal hemoglobin normal platelets normal chest x-ray without any acute findings.  EKG without any acute abnormalities.  Patient's D-dimer came back elevated 0.69 so significant for her age.  Have ordered CT angio.  Disposition will be based on CT angio.  CT angio chest due to the elevated D-dimer without any acute changes no evidence of pulmonary embolus no acute pulmonary findings.  Patient stable for discharge home follow-up with her primary care doctor also referral given to  cardiology.  Sounds as if patient is already had an echocardiogram they may want to consider nonexercise stress test because patient is seen frequently for nonspecific chest pain.   Final Clinical Impression(s) / ED Diagnoses Final diagnoses:  Atypical chest pain    Rx / DC Orders ED Discharge Orders     None         Fredia Sorrow, MD 10/01/22 Remus Loffler    Fredia Sorrow, MD 10/01/22 2114

## 2022-10-01 NOTE — ED Triage Notes (Signed)
Patient reports that she began having left neck and chest pain x 2 days. Patient states that the pain radiates into the upper abdomen and left arm. Patient states today she has increased pain when she breathes in.

## 2022-10-01 NOTE — ED Notes (Signed)
Unable to obtain BP att d/t staff attempting to insert IV. Will obtain when appropriate

## 2022-10-01 NOTE — ED Notes (Signed)
Unsuccessful IV attempt.

## 2022-10-01 NOTE — ED Provider Notes (Signed)
[facility] DEPT Provider Note  CSN: [id number] Arrival date & time: 10/01/22 1287  Chief Complaint(s) Chest Pain  HPI Katherine Lindsey is a 36 y.o. female with PMH Crohn's disease in remission, anxiety who presents emergency department for evaluation of chest pain.  Patient states that she recently saw cardiology within the last 6 months and has had a full cardiac work-up but has started to develop an intermittent left-sided chest pain near the superior lateral aspect of the breast into the axilla that is worse with breaths.  No known trauma to the area.  She denies shortness of breath, nausea, vomiting, headache, fever or other systemic symptoms.   Past Medical History Past Medical History:  Diagnosis Date   Anxiety    Crohn's disease in remission ([place])    Hasn't had symptoms since 2004   Gallstones    Hammertoe 09/21/2014   History of low transverse cesarean section 11/28/2015   VBAC, delivered 03/19/2016   Patient Active Problem List   Diagnosis Date Noted   Cough 08/31/2022   Hidradenitis suppurativa of left axilla 06/25/2022   Crohn's disease ([place]) 01/10/2022   Stress at work 10/27/2021   Mass of lower outer quadrant of left breast 10/27/2021   Depot contraception 10/27/2021   Numbness and tingling in both hands 05/09/2021   Functional dyspepsia 03/20/2021   Gastroesophageal reflux disease 08/29/2020   Atypical chest pain 08/17/2020   Nonintractable episodic headache 01/06/2020   Anxiety 07/15/2019   Hidradenitis suppurativa 07/15/2019   High risk HPV infection 02/07/2017   Well woman exam 03/25/2014   Vaginal spotting 10/17/2012   Home Medication(s) Prior to Admission medications   Medication Sig Start Date End Date Taking? Authorizing Provider  benzonatate (TESSALON) 100 MG capsule Take 1 capsule (100 mg total) by mouth 2 (two) times daily as needed for cough. 08/31/22   Ezequiel Essex, MD  medroxyPROGESTERone (DEPO-PROVERA) 150  MG/ML injection Inject 150 mg into the muscle every 3 (three) months.    [provider]  silver sulfADIAZINE (SILVADENE) 1 % cream Apply 1 Application topically daily. 06/22/22   Gladys Damme, MD  promethazine (PHENERGAN) 25 MG tablet Take 1 tablet (25 mg total) by mouth every 8 (eight) hours as needed (headache). Patient not taking: Reported on 07/14/2020 03/28/20 08/16/20  Orpah Greek, MD  SUMAtriptan (IMITREX) 50 MG tablet Take 1 tablet (50 mg total) by mouth every 2 (two) hours as needed for migraine. May repeat in 2 hours if headache persists or recurs. Patient not taking: Reported on 07/14/2020 01/27/20 08/16/20  Tedd Sias, PA                                                                                                                                    Past Surgical History Past Surgical History:  Procedure Laterality Date   CESAREAN SECTION N/A 06/16/2013   Procedure: CESAREAN SECTION;  Surgeon: Donnamae Jude,  MD;  Location: [facility];  Service: Obstetrics;  Laterality: N/A;   CHOLECYSTECTOMY  08/28/2012   Procedure: LAPAROSCOPIC CHOLECYSTECTOMY WITH INTRAOPERATIVE CHOLANGIOGRAM;  Surgeon: Madilyn Hook, DO;  Location: [facility];  Service: General;  Laterality: N/A;   INDUCED ABORTION  02/2012   Family History Family History  Problem Relation Age of Onset   Other Neg Hx    Diabetes Neg Hx    Heart disease Neg Hx    Hyperlipidemia Neg Hx    Hypertension Neg Hx    Stroke Neg Hx    Colon cancer Neg Hx    Esophageal cancer Neg Hx    Pancreatic cancer Neg Hx    Stomach cancer Neg Hx     Social History Social History   Tobacco Use   Smoking status: Never   Smokeless tobacco: Never  Vaping Use   Vaping Use: Never used  Substance Use Topics   Alcohol use: Yes    Alcohol/week: 1.0 standard drink of alcohol    Types: 1 Glasses of wine per week   Drug use: No   Allergies Patient has no known allergies.  Review of Systems Review of Systems  Cardiovascular:   Positive for chest pain.    Physical Exam Vital Signs  I have reviewed the triage vital signs BP 134/86   Pulse 61   Temp 98.4 F (36.9 C)   Resp 17   Ht 5' 8"  (1.727 m)   Wt 87.5 kg   SpO2 100%   BMI 29.35 kg/m   Physical Exam Vitals and nursing note reviewed.  Constitutional:      General: She is not in acute distress.    Appearance: She is well-developed.  HENT:     Head: Normocephalic and atraumatic.  Eyes:     Conjunctiva/sclera: Conjunctivae normal.  Cardiovascular:     Rate and Rhythm: Normal rate and regular rhythm.     Heart sounds: No murmur heard. Pulmonary:     Effort: Pulmonary effort is normal. No respiratory distress.     Breath sounds: Normal breath sounds.  Chest:     Chest wall: Tenderness present.  Abdominal:     Palpations: Abdomen is soft.     Tenderness: There is no abdominal tenderness.  Musculoskeletal:        General: No swelling.     Cervical back: Neck supple.  Skin:    General: Skin is warm and dry.     Capillary Refill: Capillary refill takes less than 2 seconds.  Neurological:     Mental Status: She is alert.  Psychiatric:        Mood and Affect: Mood normal.     ED Results and Treatments Labs (all labs ordered are listed, but only abnormal results are displayed) Labs Reviewed  BASIC METABOLIC PANEL - Abnormal; Notable for the following components:      Result Value   Glucose, Bld 105 (*)    All other components within normal limits  D-DIMER, QUANTITATIVE - Abnormal; Notable for the following components:   D-Dimer, Quant 0.69 (*)    All other components within normal limits  CBC  I-STAT BETA HCG BLOOD, ED ([facility], [facility], [facility] ONLY)  TROPONIN I (HIGH SENSITIVITY)  TROPONIN I (HIGH SENSITIVITY)  Radiology CT Angio Chest PE W/Cm &/Or Wo Cm  Result Date: 10/01/2022 CLINICAL DATA:  Left neck and chest pain for 2  days increasing with inspiration; positive D-dimer EXAM: CT ANGIOGRAPHY CHEST WITH CONTRAST TECHNIQUE: Multidetector CT imaging of the chest was performed using the standard protocol during bolus administration of intravenous contrast. Multiplanar CT image reconstructions and MIPs were obtained to evaluate the vascular anatomy. RADIATION DOSE REDUCTION: This exam was performed according to the departmental dose-optimization program which includes automated exposure control, adjustment of the mA and/or kV according to patient size and/or use of iterative reconstruction technique. CONTRAST:  40m OMNIPAQUE IOHEXOL 350 MG/ML SOLN COMPARISON:  Radiographs earlier today FINDINGS: Cardiovascular: Satisfactory opacification of the pulmonary arteries to the segmental level. No evidence of pulmonary embolism. Normal heart size. No pericardial effusion. Mediastinum/Nodes: No enlarged mediastinal, hilar, or axillary lymph nodes. Thyroid gland, trachea, and esophagus demonstrate no significant findings. Lungs/Pleura: Lungs are clear. No pleural effusion or pneumothorax. Upper Abdomen: No acute abnormality. Musculoskeletal: No chest wall abnormality. No acute osseous findings. Review of the MIP images confirms the above findings. IMPRESSION: Negative for acute pulmonary embolism. No acute abnormality in the chest. Electronically Signed   By: TPlacido SouM.D.   On: 10/01/2022 20:11   DG Chest 2 View  Result Date: 10/01/2022 CLINICAL DATA:  Chest pain. EXAM: CHEST - 2 VIEW COMPARISON:  Chest x-ray 12/26/2021. FINDINGS: The heart size and mediastinal contours are within normal limits. Both lungs are clear. No visible pleural effusions or pneumothorax. No acute osseous abnormality. Cholecystectomy clips. IMPRESSION: No active cardiopulmonary disease. Electronically Signed   By: FMargaretha SheffieldM.D.   On: 10/01/2022 08:56    Pertinent labs & imaging results that were available during my care of the patient were reviewed  by me and considered in my medical decision making (see MDM for details).  Medications Ordered in ED Medications  diclofenac Sodium (VOLTAREN) 1 % topical gel 2 g (2 g Topical Given 10/01/22 1354)  iohexol (OMNIPAQUE) 350 MG/ML injection 75 mL (75 mLs Intravenous Contrast Given 10/01/22 1958)                                                                                                                                     Procedures Procedures  (including critical care time)  Medical Decision Making / ED Course   This patient presents to the ED for concern of chest pain, this involves an extensive number of treatment options, and is a complaint that carries with it a high risk of complications and morbidity.  The differential diagnosis includes costochondritis, ACS, PE, precordial catch, fibrocystic change  MDM: Patient seen emergency room for evaluation of chest pain.  Physical exam with point tenderness to an area in the superior lateral aspect of the breast near the axilla.  Physical exam otherwise unremarkable.  Initial laboratory evaluation unremarkable.  Chest x-ray unremarkable.  ECG nonischemic.  At time  of signout, patient is pending a D-dimer.  If D-dimer negative, patient safe for discharge with outpatient follow-up.  She was given Voltaren gel with improvement of her symptoms.   Additional history obtained:  -External records from outside source obtained and reviewed including: Chart review including previous notes, labs, imaging, consultation notes   Lab Tests: -I ordered, reviewed, and interpreted labs.   The pertinent results include:   Labs Reviewed  BASIC METABOLIC PANEL - Abnormal; Notable for the following components:      Result Value   Glucose, Bld 105 (*)    All other components within normal limits  D-DIMER, QUANTITATIVE - Abnormal; Notable for the following components:   D-Dimer, Quant 0.69 (*)    All other components within normal limits  CBC  I-STAT  BETA HCG BLOOD, ED ([facility], [facility], [facility] ONLY)  TROPONIN I (HIGH SENSITIVITY)  TROPONIN I (HIGH SENSITIVITY)      EKG   EKG Interpretation  Date/Time:  Monday October 01 2022 11:45:20 EDT Ventricular Rate:  65 PR Interval:  153 QRS Duration: 80 QT Interval:  400 QTC Calculation: 416 R Axis:   72 Text Interpretation: Sinus rhythm Confirmed by [place] ([id number]) on 10/01/2022 12:54:59 PM         Imaging Studies ordered: I ordered imaging studies including CXR I independently visualized and interpreted imaging. I agree with the radiologist interpretation   Medicines ordered and prescription drug management: Meds ordered this encounter  Medications   diclofenac Sodium (VOLTAREN) 1 % topical gel 2 g   iohexol (OMNIPAQUE) 350 MG/ML injection 75 mL    -I have reviewed the patients home medicines and have made adjustments as needed  Critical interventions none  Cardiac Monitoring: The patient was maintained on a cardiac monitor.  I personally viewed and interpreted the cardiac monitored which showed an underlying rhythm of: NSR  Social Determinants of Health:  Factors impacting patients care include: none   Reevaluation: After the interventions noted above, I reevaluated the patient and found that they have :improved  Co morbidities that complicate the patient evaluation  Past Medical History:  Diagnosis Date   Anxiety    Crohn's disease in remission ([place])    Hasn't had symptoms since 2004   Gallstones    Hammertoe 09/21/2014   History of low transverse cesarean section 11/28/2015   VBAC, delivered 03/19/2016      Dispostion: I considered admission for this patient, and disposition pending D-dimer.  Please see provider signout for continuation of work-up     Final Clinical Impression(s) / ED Diagnoses Final diagnoses:  Atypical chest pain     @PCDICTATION @    Teressa Lower, MD 10/01/22 2133

## 2022-10-01 NOTE — ED Notes (Signed)
Unable to collect labs patient went to x ray

## 2022-10-01 NOTE — ED Notes (Signed)
Pt given warm blanket, a [place] sandwich and iced water

## 2022-10-01 NOTE — ED Provider Triage Note (Signed)
Emergency Medicine Provider Triage Evaluation Note  Katherine Lindsey , a 36 y.o. female  was evaluated in triage.  Pt complains of left cp. Radiates to left neck/left arm. X 2 days.  No RF for acs, no OCPs hx of thrombosis. Review of Systems  Positive: cp Negative: fever  Physical Exam  BP 133/84 (BP Location: Left Arm)   Pulse 71   Temp 98.7 F (37.1 C) (Oral)   Resp 16   Ht 5' 8"  (1.727 m)   Wt 87.5 kg   SpO2 100%   BMI 29.35 kg/m  Gen:   Awake, no distress   Resp:  Normal effort  MSK:   Moves extremities without difficulty  Other:  Ttp L chest wall  Medical Decision Making  Medically screening exam initiated at 8:44 AM.  Appropriate orders placed.  Katherine Lindsey was informed that the remainder of the evaluation will be completed by another provider, this initial triage assessment does not replace that evaluation, and the importance of remaining in the ED until their evaluation is complete.  Work up initiated   Margarita Mail, PA-C 10/01/22 [id number]

## 2022-10-01 NOTE — Discharge Instructions (Signed)
Follow-up with your primary care doctor.  Also consideration for follow-up with cardiology.  Would recommend echocardiogram and nonexercise stress test.  May have had the echocardiogram done already.  Today's work-up without evidence of any acute cardiac event no evidence of an acute pulmonary process and no blood clots in the lungs.  No definitive explanation from the emergency department work-up for the chest pain but close follow-up is warranted.  Work note provided

## 2022-10-02 ENCOUNTER — Telehealth: Payer: Self-pay

## 2022-10-02 NOTE — Patient Outreach (Signed)
  Care Coordination TOC Note Transition Care Management Follow-up Telephone Call Date of discharge and from where: [facility] 10/01/22 How have you been since you were released from the hospital? "I am feeling ok, I made an appointment with my PCP for Monday". Any questions or concerns? No  Items Reviewed: Did the pt receive and understand the discharge instructions provided? Yes  Medications obtained and verified? Yes  Other? No  Any new allergies since your discharge? No  Dietary orders reviewed? No Do you have support at home? Yes   Home Care and Equipment/Supplies: Were home health services ordered? no If so, what is the name of the agency? N/A  Has the agency set up a time to come to the patient's home? no Were any new equipment or medical supplies ordered?  No What is the name of the medical supply agency? N/A Were you able to get the supplies/equipment? N/A Do you have any questions related to the use of the equipment or supplies? No  Functional Questionnaire: (I = Independent and D = Dependent) ADLs: Patient was at work, could not review  Bathing/Dressing- N/A  Meal Prep- N/A  Eating- N/A  Maintaining continence- N/A  Transferring/Ambulation- N/A  Managing Meds- N/A  Follow up appointments reviewed:  PCP Hospital f/u appt confirmed? Yes  Scheduled to see Dr. Madison Hickman on 10/08/22 @ 2:10. [facility] f/u appt confirmed? No   Are transportation arrangements needed? No  If their condition worsens, is the pt aware to call PCP or go to the Emergency Dept.? Yes Was the patient provided with contact information for the PCP's office or ED? Yes Was to pt encouraged to call back with questions or concerns? Yes  SDOH assessments and interventions completed:   Yes  Care Coordination Interventions Activated:  Yes   Care Coordination Interventions:  No Care Coordination interventions needed at this time.   Encounter Outcome:  Pt. Visit Completed

## 2022-10-07 NOTE — Progress Notes (Deleted)
  SUBJECTIVE:   CHIEF COMPLAINT / HPI:   Hospital follow up for left-sided chest pain on 10/01/22. Troponin negative x2, EKG and CXR normal. D-dimer elevated to 0.69 but CT angio chest negative. Referral to cardiology was placed and patient has appointment on 10/26/22.   PERTINENT  PMH / PSH: GERD, anxiety, Crohn's disease  OBJECTIVE:  There were no vitals taken for this visit. Physical Exam   ASSESSMENT/PLAN:  There are no diagnoses linked to this encounter. No follow-ups on file. Wells Guiles, DO 10/07/2022, 9:32 PM PGY-***, [facility] {    This will disappear when note is signed, click to select method of visit    :1}

## 2022-10-08 ENCOUNTER — Ambulatory Visit: Payer: BC Managed Care – PPO | Admitting: Student

## 2022-10-10 NOTE — Progress Notes (Unsigned)
ANNUAL EXAM Patient name: Katherine Lindsey MRN [id number]  Date of birth: [birth year] Chief Complaint:   No chief complaint on file.  History of Present Illness:   Katherine Lindsey is a 36 y.o. [phone] {race:25618} female being seen today for a routine annual exam.  Current complaints: ***  No LMP recorded. Patient has had an injection.   The pregnancy intention screening data noted above was reviewed. Potential methods of contraception were discussed. The patient elected to proceed with No data recorded.   Last pap 10/2021. Results were: ASCUS w/ HRHPV negative. - no transfomration zone  H/O abnormal pap: {yes/yes***/no:23866} Last mammogram: ORDERED Last colonoscopy: N/A     08/31/2022    3:08 PM 06/22/2022    2:18 PM 03/09/2022    4:01 PM 01/10/2022   10:38 AM 12/29/2021    8:52 AM  Depression screen PHQ 2/9  Decreased Interest 0 1 0 0 0  Down, Depressed, Hopeless 0 0 0 0 0  PHQ - 2 Score 0 1 0 0 0  Altered sleeping 0 1 0 0 0  Tired, decreased energy 0 1 0 1 0  Change in appetite 0 0 0 0 0  Feeling bad or failure about yourself  0 0 0 0 0  Trouble concentrating 0 0 0 0 0  Moving slowly or fidgety/restless 0 0 0 0 0  Suicidal thoughts 0 0 0 0 0  PHQ-9 Score 0 3 0 1 0        10/31/2021    2:38 PM 05/16/2021    9:51 AM 02/27/2021   11:34 AM 02/16/2021    9:13 AM  GAD 7 : Generalized Anxiety Score  Nervous, Anxious, on Edge 1 1 1 1   Control/stop worrying 0 0 0 0  Worry too much - different things 0 1 0 0  Trouble relaxing 0 0 0 0  Restless 0 0 0 0  Easily annoyed or irritable 0 0 0 0  Afraid - awful might happen 0 0 0 0  Total GAD 7 Score 1 2 1 1   Anxiety Difficulty    Not difficult at all     Review of Systems:   Pertinent items are noted in HPI Denies any headaches, blurred vision, fatigue, shortness of breath, chest pain, abdominal pain, abnormal vaginal discharge/itching/odor/irritation, problems with periods, bowel movements, urination, or intercourse  unless otherwise stated above. Pertinent History Reviewed:  Reviewed past medical,surgical, social and family history.  Reviewed problem list, medications and allergies. Physical Assessment:  There were no vitals filed for this visit.There is no height or weight on file to calculate BMI.        Physical Examination:   General appearance - well appearing, and in no distress  Mental status - alert, oriented to person, place, and time  Psych:  She has a normal mood and affect  Skin - warm and dry, normal color, no suspicious lesions noted  Chest - effort normal, all lung fields clear to auscultation bilaterally  Heart - normal rate and regular rhythm  Neck:  midline trachea, no thyromegaly or nodules  Breasts - breasts appear normal, no suspicious masses, no skin or nipple changes or  axillary nodes  Abdomen - soft, nontender, nondistended, no masses or organomegaly  Pelvic - VULVA: normal appearing vulva with no masses, tenderness or lesions  VAGINA: normal appearing vagina with normal color and discharge, no lesions  CERVIX: normal appearing cervix without discharge or lesions, no CMT  Thin prep pap  is {Desc; done/not:10129} *** HR HPV cotesting  UTERUS: uterus is felt to be normal size, shape, consistency and nontender   ADNEXA: No adnexal masses or tenderness noted.  Rectal - normal rectal, good sphincter tone, no masses felt. Hemoccult: ***  Extremities:  No swelling or varicosities noted  Chaperone present for exam  No results found for this or any previous visit (from the past 24 hour(s)).  Assessment & Plan:  1) Well-Woman Exam  2) ***  Labs/procedures today: ***  Mammogram: {Mammo f/u:25212::"@ 35yo"}, or sooner if problems Colonoscopy: {TCS f/u:25213::"@ 35yo"}, or sooner if problems  No orders of the defined types were placed in this encounter.   Meds: No orders of the defined types were placed in this encounter.   Follow-up: No follow-ups on file.  Darliss Cheney, MD 10/10/2022 7:13 PM

## 2022-10-11 ENCOUNTER — Ambulatory Visit (INDEPENDENT_AMBULATORY_CARE_PROVIDER_SITE_OTHER): Payer: BC Managed Care – PPO | Admitting: Obstetrics and Gynecology

## 2022-10-11 ENCOUNTER — Encounter: Payer: Self-pay | Admitting: Obstetrics and Gynecology

## 2022-10-11 ENCOUNTER — Other Ambulatory Visit: Payer: Self-pay

## 2022-10-11 ENCOUNTER — Other Ambulatory Visit (HOSPITAL_COMMUNITY)
Admission: RE | Admit: 2022-10-11 | Discharge: 2022-10-11 | Disposition: A | Discharge status: Home / Self Care | Payer: BC Managed Care – PPO | Source: Ambulatory Visit | Attending: Family Medicine | Admitting: Family Medicine

## 2022-10-11 VITALS — BP 123/82 | HR 71 | Ht 68.0 in | Wt 199.8 lb

## 2022-10-11 DIAGNOSIS — Z01419 Encounter for gynecological examination (general) (routine) without abnormal findings: Secondary | ICD-10-CM | POA: Diagnosis not present

## 2022-10-11 DIAGNOSIS — Z3042 Encounter for surveillance of injectable contraceptive: Secondary | ICD-10-CM

## 2022-10-11 MED ORDER — MEDROXYPROGESTERONE ACETATE 150 MG/ML IM SUSP
150.0000 mg | Freq: Once | INTRAMUSCULAR | Status: AC
  Administered 2022-10-11: 150 mg via INTRAMUSCULAR

## 2022-10-12 ENCOUNTER — Ambulatory Visit: Payer: BC Managed Care – PPO | Admitting: Student

## 2022-10-15 LAB — CYTOLOGY - PAP
Chlamydia: NEGATIVE
Comment: NEGATIVE
Comment: NEGATIVE
Comment: NEGATIVE
Comment: NORMAL
Diagnosis: NEGATIVE
High risk HPV: NEGATIVE
Neisseria Gonorrhea: NEGATIVE
Trichomonas: NEGATIVE

## 2022-10-17 NOTE — Progress Notes (Deleted)
    SUBJECTIVE:   CHIEF COMPLAINT / HPI:   Katherine Lindsey is a 36 y.o. female who presents to the [facility] clinic today to discuss the following concerns:   F/u Non-specific Chest Pain Patient presents for follow up for her intermittent chest pain. She presented to the ED for this concern on 10/16. Cardiac workup with EKG, Troponin x2 were negative. She had an elevated D-dimer so CTPA was ordered and returned negative for PE. CBC was normal without anemia and BMP unremarkable. She was advised to f/u with PCP and cardiology with consideration of non-stress test given frequent ED/clinic visits for similar concerns with unremarkable work ups and unremarkable Echo in January.   Additionally, per chart review appears patient has seen GI for this concern, had normal EGD. Was advised to try hyoscyamine. Has tried Naproxen due to possible costochondritis component but she had no relief.   PERTINENT  PMH / PSH: Crohn's disease, anxiety, GERD  OBJECTIVE:   LMP  (LMP Unknown)    General: NAD, pleasant, able to participate in exam Cardiac: RRR, no murmurs. Respiratory: CTAB, normal effort, No wheezes, rales or rhonchi Abdomen: Bowel sounds present, nontender, nondistended, no hepatosplenomegaly. Extremities: no edema or cyanosis. Skin: warm and dry, no rashes noted Neuro: alert, no obvious focal deficits Psych: Normal affect and mood  ASSESSMENT/PLAN:   No problem-specific Assessment & Plan notes found for this encounter.     Sharion Settler, [place]

## 2022-10-22 ENCOUNTER — Ambulatory Visit: Payer: BC Managed Care – PPO | Admitting: Family Medicine

## 2022-10-26 ENCOUNTER — Ambulatory Visit: Payer: BC Managed Care – PPO | Attending: Internal Medicine | Admitting: Internal Medicine

## 2022-12-27 ENCOUNTER — Ambulatory Visit: Payer: BC Managed Care – PPO

## 2023-01-08 ENCOUNTER — Ambulatory Visit (INDEPENDENT_AMBULATORY_CARE_PROVIDER_SITE_OTHER): Payer: BC Managed Care – PPO

## 2023-01-08 ENCOUNTER — Encounter: Payer: Self-pay | Admitting: Family Medicine

## 2023-01-08 ENCOUNTER — Ambulatory Visit (INDEPENDENT_AMBULATORY_CARE_PROVIDER_SITE_OTHER): Payer: BC Managed Care – PPO | Admitting: Family Medicine

## 2023-01-08 ENCOUNTER — Other Ambulatory Visit: Payer: Self-pay

## 2023-01-08 VITALS — BP 132/77 | HR 64 | Ht 68.0 in | Wt 209.9 lb

## 2023-01-08 VITALS — BP 130/72 | HR 79 | Ht 68.0 in | Wt 211.8 lb

## 2023-01-08 DIAGNOSIS — R0789 Other chest pain: Secondary | ICD-10-CM

## 2023-01-08 DIAGNOSIS — R519 Headache, unspecified: Secondary | ICD-10-CM

## 2023-01-08 DIAGNOSIS — Z3042 Encounter for surveillance of injectable contraceptive: Secondary | ICD-10-CM

## 2023-01-08 MED ORDER — MELOXICAM 15 MG PO TABS: ORAL_TABLET | ORAL | 0 refills | Status: DC

## 2023-01-08 MED ORDER — MEDROXYPROGESTERONE ACETATE 150 MG/ML IM SUSP
150.0000 mg | Freq: Once | INTRAMUSCULAR | Status: AC
  Administered 2023-01-08: 150 mg via INTRAMUSCULAR

## 2023-01-08 NOTE — Patient Instructions (Addendum)
It was wonderful to see you today.  Please bring ALL of your medications with you to every visit.   Today we talked about:  I am sending an anti-inflammatory medication for you to take daily for 7 days and then as needed for your chest discomfort. Try and stay hydrated! Take Tylenol as needed.   Thank you for coming to your visit as scheduled. We have had a large "no-show" problem lately, and this significantly limits our ability to see and care for patients. As a friendly reminder- if you cannot make your appointment please call to cancel. We do have a no show policy for those who do not cancel within 24 hours. Our policy is that if you miss or fail to cancel an appointment within 24 hours, 3 times in a 56-month period, you may be dismissed from our clinic.   Thank you for choosing [facility].   Please call [phone] with any questions about today's appointment.  Please be sure to schedule follow up at the front  desk before you leave today.   Sharion Settler, DO PGY-3 Family Medicine

## 2023-01-08 NOTE — Progress Notes (Signed)
    SUBJECTIVE:   CHIEF COMPLAINT / HPI:   Katherine Lindsey is a 37 y.o. female who presents to the [facility] clinic today to discuss the following concerns:   High Blood Pressure, "head feeling numb" States that [facility] and they noticed her BP was high. She states they were a little concerned about that.  She notes some numbness to the left-side of her head. She also describes a "weird feeling, like something is trapped" and has some discomfort to the left side of her chest. She feels like the sensation "goes to my head" and feels like a numbness and pressure. The sensation has been going on intermittently for 2 years. She states that prior workup included an endoscopy which was normal. On chart review, appears she also went to the ED in October for this and was given a referral cardiology. In th ED she had a positive D-D so CTA was ordered and negative. She had negative troponins x2. She had a normal EKG and CXR. She had an echo 01/08/22 which showed EF 55-60%, normal. She has also tried Voltaren gel in the past without relief. She has been prescribed Hydroxyzine in the past as well, hasn't really been taking it though.  She does endorse a history of anxiety but she states that she doesn't feel anxious. 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   PERTINENT  PMH / PSH: Anxiety, atypical chest pain, Crohns  OBJECTIVE:   BP 130/72   Pulse 79   Ht 5\' 8"  (1.727 m)   Wt 211 lb 12.8 oz (96.1 kg)   LMP 11/30/2022 (Approximate) Comment: spotting  SpO2 100%   BMI 32.20 kg/m    BP Readings from Last 3 Encounters:  01/08/23 130/72  01/08/23 132/77  10/11/22 123/82   General: NAD, pleasant, able to participate in exam Cardiac: RRR, no murmurs. Respiratory: CTAB, normal effort, No wheezes, rales or rhonchi MSK: left-sided chest  Extremities: no edema or cyanosis. Skin: warm and dry, no rashes noted Neuro:  Cranial Nerves: II: Visual Fields are full.  III,IV, VI: EOMI without ptosis or diplopia.  V: Facial sensation is symmetric to temperature VII: Facial movement is symmetric.  VIII: hearing is intact to voice XI: Shoulder shrug is symmetric. Motor: Tone is normal. Bulk is normal.  Sensory: Sensation is symmetric to light touch and temperature in the face Psych: Normal affect and mood  ASSESSMENT/PLAN:   1. Atypical chest pain Has had extensive workup including EKG, CXR, CTA chest, echo- all have been unremarkable. Feel less likely this is cardiac. Could possibly be related to costochondritis vs gas pains (she feels some relief with burping). Will trial anti-inflammatories. Feel that anxiety may be contributing to her non-specific symptoms. She does have PRN hydroxyzine to use as needed. - meloxicam (MOBIC) 15 MG tablet; Take daily for 7 days and then as needed for chest-discomfort. Take with food.  Dispense: 30 tablet; Refill: 0  2. Left-sided headache Unclear if tension HA vs atypical migraine. Does not have blurred vision or double vision. No aura type symptoms. No associated weakness. Has had MRI of brain in the past which was unremarkable. Normal neuro exam today. -Trial conservative measures with Tylenol as needed   Sharion Settler, [place]

## 2023-01-08 NOTE — Progress Notes (Signed)
Karie Kirks here for Depo-Provera Injection. It has been [redacted]w[redacted]d since last depo injection on 10/11/22. Per protocol, since patient is still within 11 to 13 week window from last dose, injection administered without complication. Patient will return in 3 months for next injection between 4/10 and 4/24. Next annual visit due 10/12/23.   Alinda Money, RN 01/08/2023  8:18 AM

## 2023-01-16 ENCOUNTER — Encounter: Payer: Self-pay | Admitting: Student

## 2023-01-16 ENCOUNTER — Telehealth: Payer: Self-pay | Admitting: Student

## 2023-01-16 NOTE — Telephone Encounter (Signed)
Patient is calling asking the doctor to call and speak to her about the mychart message for [facility] paperwork. I told patient doctor was in clinic seeing patients.   Please Advise.  Thanks!

## 2023-01-16 NOTE — Telephone Encounter (Signed)
Spoke with patient regarding FMLA paperwork.  She notes it is for her atypical chest pain and arm numbness to which she has received extensive workup in both our clinic and the emergency department and has had cardiology and GI workup as well.  She has never received neurology workup.  She has had FMLA paperwork submitted for her in the past (01/18/2022 upon chart review by Dr. Thompson Grayer).  This would give her the ability to miss the occasional work day when the symptoms come on.  We have established an appointment on 01/22/2019 4 PM to discuss FMLA and the extensive workup she has received thus far.  Will consider neurologist referral at that time.

## 2023-01-19 ENCOUNTER — Emergency Department (HOSPITAL_COMMUNITY)
Admission: EM | Admit: 2023-01-19 | Discharge: 2023-01-19 | Disposition: A | Discharge status: Home / Self Care | Payer: BC Managed Care – PPO | Attending: Emergency Medicine | Admitting: Emergency Medicine

## 2023-01-19 ENCOUNTER — Encounter (HOSPITAL_COMMUNITY): Payer: Self-pay

## 2023-01-19 DIAGNOSIS — L02411 Cutaneous abscess of right axilla: Secondary | ICD-10-CM | POA: Diagnosis not present

## 2023-01-19 DIAGNOSIS — Z1152 Encounter for screening for COVID-19: Secondary | ICD-10-CM | POA: Insufficient documentation

## 2023-01-19 DIAGNOSIS — J029 Acute pharyngitis, unspecified: Secondary | ICD-10-CM | POA: Insufficient documentation

## 2023-01-19 DIAGNOSIS — L02419 Cutaneous abscess of limb, unspecified: Secondary | ICD-10-CM

## 2023-01-19 LAB — RESP PANEL BY RT-PCR (RSV, FLU A&B, COVID)  RVPGX2
Influenza A by PCR: NEGATIVE
Influenza B by PCR: NEGATIVE
Resp Syncytial Virus by PCR: NEGATIVE
SARS Coronavirus 2 by RT PCR: NEGATIVE

## 2023-01-19 LAB — GROUP A STREP BY PCR: Group A Strep by PCR: NOT DETECTED

## 2023-01-19 MED ORDER — CEPHALEXIN 500 MG PO CAPS: 500.0000 mg | ORAL_CAPSULE | Freq: Four times a day (QID) | ORAL | 0 refills | Status: DC

## 2023-01-19 NOTE — ED Triage Notes (Signed)
Pt c/o sore throat, body aches, chills; since Tuesday; also endorses abscess under R arm; daughter has been sick with similar symptoms

## 2023-01-19 NOTE — Discharge Instructions (Addendum)
Please take antibiotics as prescribed Use warm compresses in the axillary area Return if you are having worsening redness, fever, streaking Make sure you are drinking plenty of fluids and continue to use acetaminophen and ibuprofen for your pharyngitis Strep test and COVID and flu tests are negative here today

## 2023-01-19 NOTE — ED Provider Notes (Signed)
[facility] Provider Note   CSN: [id number] Arrival date & time: 01/19/23  6606     History  No chief complaint on file.   Katherine Lindsey is a 37 y.o. female.  HPI 37 year old female presents today complaining of sore throat.  Her daughter was just seen for similar symptoms.  She states she has had a sore throat since Tuesday.  She has had some fever and some chills.  It is painful to eat but she is able to take p.o.  She has been using the counter cough and cold medicine, some hot tea with honey and lemon, and ibuprofen without relief.  She is able to take p.o.  She does not have any other significant health medical history.  She also has some swelling under her right axilla.  She has had previous history of having abscesses.  She has not noted any discharge, redness, or streaking from this area.    Home Medications Prior to Admission medications   Medication Sig Start Date End Date Taking? Authorizing Provider  cephALEXin (KEFLEX) 500 MG capsule Take 1 capsule (500 mg total) by mouth 4 (four) times daily. 01/19/23  Yes Pattricia Boss, MD  medroxyPROGESTERone (DEPO-PROVERA) 150 MG/ML injection Inject 150 mg into the muscle every 3 (three) months. Patient not taking: Reported on 01/08/2023    [provider]  meloxicam (MOBIC) 15 MG tablet Take daily for 7 days and then as needed for chest-discomfort. Take with food. 01/08/23   Sharion Settler, DO  promethazine (PHENERGAN) 25 MG tablet Take 1 tablet (25 mg total) by mouth every 8 (eight) hours as needed (headache). Patient not taking: Reported on 07/14/2020 03/28/20 08/16/20  Orpah Greek, MD  SUMAtriptan (IMITREX) 50 MG tablet Take 1 tablet (50 mg total) by mouth every 2 (two) hours as needed for migraine. May repeat in 2 hours if headache persists or recurs. Patient not taking: Reported on 07/14/2020 01/27/20 08/16/20  Tedd Sias, PA      Allergies    Patient has  no known allergies.    Review of Systems   Review of Systems  Physical Exam Updated Vital Signs BP (!) 132/91   Pulse 67   Temp 98.8 F (37.1 C) (Oral)   Resp 18   Ht 1.727 m (5\' 8" )   Wt 94.8 kg   LMP 11/30/2022 (Approximate) Comment: spotting  SpO2 100%   BMI 31.78 kg/m  Physical Exam Vitals and nursing note reviewed.  Constitutional:      General: She is not in acute distress.    Appearance: She is well-developed.  HENT:     Head: Normocephalic and atraumatic.     Right Ear: External ear normal.     Left Ear: External ear normal.     Nose: Nose normal.     Mouth/Throat:     Mouth: Mucous membranes are moist.     Pharynx: Oropharyngeal exudate present.  Eyes:     Conjunctiva/sclera: Conjunctivae normal.     Pupils: Pupils are equal, round, and reactive to light.  Cardiovascular:     Rate and Rhythm: Normal rate and regular rhythm.  Pulmonary:     Effort: Pulmonary effort is normal.  Abdominal:     General: Abdomen is flat. Bowel sounds are normal.     Palpations: Abdomen is soft.  Musculoskeletal:        General: Normal range of motion.     Cervical back: Normal range of motion  and neck supple.  Skin:    General: Skin is warm and dry.     Comments: Right axilla with 2-3 areas of mild swelling and tenderness No redness No pointing abscess  Neurological:     Mental Status: She is alert and oriented to person, place, and time.     Motor: No abnormal muscle tone.     Coordination: Coordination normal.  Psychiatric:        Behavior: Behavior normal.        Thought Content: Thought content normal.     ED Results / Procedures / Treatments   Labs (all labs ordered are listed, but only abnormal results are displayed) Labs Reviewed  GROUP A STREP BY PCR  RESP PANEL BY RT-PCR (RSV, FLU A&B, COVID)  [id number]    EKG None  Radiology No results found.  Procedures Procedures    Medications Ordered in ED Medications - No data to display  ED Course/  Medical Decision Making/ A&P                             Medical Decision Making  Presents today with sore throat and with axillary swelling.  Throat does appear to have some exudative pharyngitis.  However her strep, COVID is negative.  Her daughter has similar symptoms and by report also had a negative strep test making strep less likely. She will be started on antibiotics for early abscesses in her right axilla.  Plan Keflex. She will be treated symptomatically for her likely viral pharyngitis. Discussed return precautions and need for follow-up and patient voices understanding        Final Clinical Impression(s) / ED Diagnoses Final diagnoses:  Pharyngitis, unspecified etiology  Axillary abscess    Rx / DC Orders ED Discharge Orders          Ordered    cephALEXin (KEFLEX) 500 MG capsule  4 times daily        01/19/23 1047              Pattricia Boss, MD 01/19/23 1048

## 2023-01-20 ENCOUNTER — Telehealth: Payer: Self-pay | Admitting: Family Medicine

## 2023-01-20 ENCOUNTER — Ambulatory Visit (HOSPITAL_COMMUNITY)
Admission: EM | Admit: 2023-01-20 | Discharge: 2023-01-20 | Disposition: A | Discharge status: Home / Self Care | Payer: BC Managed Care – PPO | Attending: Physician Assistant | Admitting: Physician Assistant

## 2023-01-20 ENCOUNTER — Other Ambulatory Visit: Payer: Self-pay

## 2023-01-20 ENCOUNTER — Encounter (HOSPITAL_COMMUNITY): Payer: Self-pay | Admitting: *Deleted

## 2023-01-20 DIAGNOSIS — J020 Streptococcal pharyngitis: Secondary | ICD-10-CM

## 2023-01-20 DIAGNOSIS — J029 Acute pharyngitis, unspecified: Secondary | ICD-10-CM

## 2023-01-20 MED ORDER — LIDOCAINE VISCOUS HCL 2 % MT SOLN: 10.0000 mL | Freq: Four times a day (QID) | OROMUCOSAL | 1 refills | Status: DC

## 2023-01-20 NOTE — ED Triage Notes (Addendum)
Pt states was seen yesterday in ED for sore throat -- had negative strep, influenza, and Covid. Pt was placed on Keflex for right axillary abscesses. Pt states she was up all night with excruciating throat pain. Placed call to PCP this AM - was told perhaps she had false negative strep, and she might possibly be able to get prescribed oral rinse to help with pain. Pt states taking IBU regularly without relief. States had 100.2 temp 4 days ago, which has resolved. Last dose 400mg  IBU @ 0200; states took 800mg  yesterday. STates daughter tested positive for influenza yesterday.

## 2023-01-20 NOTE — Telephone Encounter (Signed)
**  After Hours/ Emergency Line Call**  Received a call to report that Katherine Lindsey is having significant throat pain. She was evaluated in the ER and was negative for strep but has significant amount of pain. She reports that Ibuprofen helps her pain and she is able to drink and eat soup after taking the medication. She is wanting to make sure of what she can do for the pain and how much Ibuprofen she is able to take. Discussed with patient max doses recommended for Ibuprofen and also encouraged patient to use Tylenol to see if it helps as well.  Red flags discussed and if patient unable to tolerate liquids, she should go to the ER vs if she is having significant pain but can still hydrate she could consider seeing an urgent care physician or waiting until her appointment with her PCP (scheduled for 2/6).  Will forward to PCP.  Rise Patience, DO  PGY-3, [facility] 01/20/2023 2:36 AM

## 2023-01-20 NOTE — ED Provider Notes (Signed)
[place]    CSN: [id number] Arrival date & time: 01/20/23  1004      History   Chief Complaint Chief Complaint  Patient presents with   Sore Throat    HPI Katherine Lindsey is a 37 y.o. female.   37 year old female presents with sore throat and painful swallowing.  Patient indicates she was seen in the emergency room yesterday morning.  She indicates that she was tested for strep but it was negative test.  She indicates she was placed on Keflex to treat presumed strep throat along with a chest of the right axilla.  She indicates she did have a negative COVID and flu test yesterday as well.  She indicates that she has been taking the Keflex, salt water gargles, and taking OTC ibuprofen without relief of her symptoms.  She indicates she is having extremely painful swallowing.  She indicates it feels like "razor blades" when she tries to swallow.  She also indicates that she is get some swelling of the nodes of the neck.  She is not having fever, chills, body aches or pain.  She does indicate that her daughter tested positive for flu yesterday.  Patient indicates that she is not having cough or congestion at the present time.  She is tolerating fluids well. Patient indicates that she called her PCP yesterday and was advised to be seen today and possibly request some Magic mouthwash which may help decrease her pain and discomfort.   Sore Throat    Past Medical History:  Diagnosis Date   Anxiety    Chest pain    Crohn's disease in remission [facility])    Hasn't had symptoms since 2004   Gallstones    Hammertoe 09/21/2014   History of low transverse cesarean section 11/28/2015   Vaginal spotting 10/17/2012   VBAC, delivered 03/19/2016    Patient Active Problem List   Diagnosis Date Noted   Cough 08/31/2022   Hidradenitis suppurativa of left axilla 06/25/2022   Crohn's disease ([place]) 01/10/2022   Stress at work 10/27/2021   Mass of lower outer quadrant of left breast  10/27/2021   Depot contraception 10/27/2021   Numbness and tingling in both hands 05/09/2021   Functional dyspepsia 03/20/2021   Gastroesophageal reflux disease 08/29/2020   Atypical chest pain 08/17/2020   Nonintractable episodic headache 01/06/2020   Anxiety 07/15/2019   Hidradenitis suppurativa 07/15/2019   High risk HPV infection 02/07/2017   Well woman exam 03/25/2014   Vaginal spotting 10/17/2012    Past Surgical History:  Procedure Laterality Date   CESAREAN SECTION N/A 06/16/2013   Procedure: CESAREAN SECTION;  Surgeon: Donnamae Jude, MD;  Location: [facility];  Service: Obstetrics;  Laterality: N/A;   CHOLECYSTECTOMY  08/28/2012   Procedure: LAPAROSCOPIC CHOLECYSTECTOMY WITH INTRAOPERATIVE CHOLANGIOGRAM;  Surgeon: Madilyn Hook, DO;  Location: [place];  Service: General;  Laterality: N/A;   INDUCED ABORTION  02/2012    OB History     Gravida  4   Para  3   Term  3   Preterm      AB  1   Living  3      SAB  0   IAB  1   Ectopic      Multiple  0   Live Births  3        Obstetric Comments  2014 LTCS with extension of hysterotomy inferiorly          Home Medications    Prior  to Admission medications   Medication Sig Start Date End Date Taking? Authorizing Provider  cephALEXin (KEFLEX) 500 MG capsule Take 1 capsule (500 mg total) by mouth 4 (four) times daily. 01/19/23  Yes Pattricia Boss, MD  magic mouthwash (lidocaine, diphenhydrAMINE, alum & mag hydroxide) suspension Swish and spit 10 mLs 4 (four) times daily. 01/20/23  Yes Nyoka Lint, PA-C  medroxyPROGESTERone (DEPO-PROVERA) 150 MG/ML injection Inject 150 mg into the muscle every 3 (three) months.   Yes [provider]  meloxicam (MOBIC) 15 MG tablet Take daily for 7 days and then as needed for chest-discomfort. Take with food. 01/08/23   Sharion Settler, DO  promethazine (PHENERGAN) 25 MG tablet Take 1 tablet (25 mg total) by mouth every 8 (eight) hours as needed (headache). Patient not  taking: Reported on 07/14/2020 03/28/20 08/16/20  Orpah Greek, MD  SUMAtriptan (IMITREX) 50 MG tablet Take 1 tablet (50 mg total) by mouth every 2 (two) hours as needed for migraine. May repeat in 2 hours if headache persists or recurs. Patient not taking: Reported on 07/14/2020 01/27/20 08/16/20  Tedd Sias, PA    Family History Family History  Problem Relation Age of Onset   Other Neg Hx    Diabetes Neg Hx    Heart disease Neg Hx    Hyperlipidemia Neg Hx    Hypertension Neg Hx    Stroke Neg Hx    Colon cancer Neg Hx    Esophageal cancer Neg Hx    Pancreatic cancer Neg Hx    Stomach cancer Neg Hx     Social History Social History   Tobacco Use   Smoking status: Never   Smokeless tobacco: Never  Vaping Use   Vaping Use: Never used  Substance Use Topics   Alcohol use: Not Currently   Drug use: Never     Allergies   Patient has no known allergies.   Review of Systems Review of Systems  HENT:  Positive for sore throat.      Physical Exam Triage Vital Signs ED Triage Vitals  Enc Vitals Group     BP 01/20/23 1020 129/88     Pulse Rate 01/20/23 1020 74     Resp 01/20/23 1020 16     Temp 01/20/23 1020 98.3 F (36.8 C)     Temp Source 01/20/23 1020 Oral     SpO2 01/20/23 1020 98 %     Weight --      Height --      Head Circumference --      Peak Flow --      Pain Score 01/20/23 1022 8     Pain Loc --      Pain Edu? --      Excl. in [place]? --    No data found.  Updated Vital Signs BP 129/88   Pulse 74   Temp 98.3 F (36.8 C) (Oral)   Resp 16   SpO2 98%   Visual Acuity Right Eye Distance:   Left Eye Distance:   Bilateral Distance:    Right Eye Near:   Left Eye Near:    Bilateral Near:     Physical Exam Constitutional:      Appearance: She is well-developed.  HENT:     Right Ear: Tympanic membrane and ear canal normal.     Left Ear: Tympanic membrane and ear canal normal.     Mouth/Throat:     Mouth: Mucous membranes are moist.  Pharynx: Oropharyngeal exudate and posterior oropharyngeal erythema present.  Cardiovascular:     Rate and Rhythm: Normal rate and regular rhythm.     Heart sounds: Normal heart sounds.  Pulmonary:     Effort: Pulmonary effort is normal.     Breath sounds: Normal breath sounds and air entry. No wheezing, rhonchi or rales.  Lymphadenopathy:     Cervical: Cervical adenopathy (mild bilat anterior adenopathy) present.  Neurological:     Mental Status: She is alert.      UC Treatments / Results  Labs (all labs ordered are listed, but only abnormal results are displayed) Labs Reviewed - No data to display  EKG   Radiology No results found.  Procedures Procedures (including critical care time)  Medications Ordered in UC Medications - No data to display  Initial Impression / Assessment and Plan / UC Course  I have reviewed the triage vital signs and the nursing notes.  Pertinent labs & imaging results that were available during my care of the patient were reviewed by me and considered in my medical decision making (see chart for details).    Plan: The diagnosis to be treated with the following: 1.  Strep throat: A.  Advised to continue Keflex as directed to treat the infection. B.  [facility] Magic mouthwash, 2 teaspoons gargle 60 seconds then spit, 4 times a day to help soothe the discomfort. 2.  Sore throat: A.  OTC Advil, 600 mg every 6 hours with food to help decrease pain and discomfort. 3.  Patient advised follow-up PCP or return to urgent care as needed. Final Clinical Impressions(s) / UC Diagnoses   Final diagnoses:  Strep throat  Sore throat     Discharge Instructions      Advised to use the Magic mouthwash, 2 teaspoons gargle 60 seconds then spit half swallow half.  Repeat this 4 times a day to help soothe the throat. Advised take the Advil OTC, 3 tablets every 6 hours with food to help decrease pain and discomfort. Advised to continue with salt water  gargles, lozenges to help soothe the throat.  Advised to continue to take Keflex as directed until completed.  Advised follow-up PCP or return to urgent care as needed.    ED Prescriptions     Medication Sig Dispense Auth. Provider   magic mouthwash (lidocaine, diphenhydrAMINE, alum & mag hydroxide) suspension Swish and spit 10 mLs 4 (four) times daily. 150 mL Nyoka Lint, PA-C      PDMP not reviewed this encounter.   Nyoka Lint, PA-C 01/20/23 1056

## 2023-01-20 NOTE — Discharge Instructions (Signed)
Advised to use the Magic mouthwash, 2 teaspoons gargle 60 seconds then spit half swallow half.  Repeat this 4 times a day to help soothe the throat. Advised take the Advil OTC, 3 tablets every 6 hours with food to help decrease pain and discomfort. Advised to continue with salt water gargles, lozenges to help soothe the throat.  Advised to continue to take Keflex as directed until completed.  Advised follow-up PCP or return to urgent care as needed.

## 2023-01-22 ENCOUNTER — Ambulatory Visit (INDEPENDENT_AMBULATORY_CARE_PROVIDER_SITE_OTHER): Payer: BC Managed Care – PPO | Admitting: Student

## 2023-01-22 ENCOUNTER — Encounter: Payer: Self-pay | Admitting: Student

## 2023-01-22 ENCOUNTER — Telehealth: Payer: Self-pay

## 2023-01-22 VITALS — BP 124/68 | HR 91 | Temp 100.9°F | Ht 68.0 in | Wt 201.6 lb

## 2023-01-22 DIAGNOSIS — B349 Viral infection, unspecified: Secondary | ICD-10-CM

## 2023-01-22 DIAGNOSIS — F419 Anxiety disorder, unspecified: Secondary | ICD-10-CM | POA: Diagnosis not present

## 2023-01-22 DIAGNOSIS — R200 Anesthesia of skin: Secondary | ICD-10-CM

## 2023-01-22 MED ORDER — HYDROXYZINE HCL 10 MG PO TABS: 10.0000 mg | ORAL_TABLET | Freq: Three times a day (TID) | ORAL | 0 refills | Status: DC | PRN

## 2023-01-22 NOTE — Progress Notes (Unsigned)
  SUBJECTIVE:   CHIEF COMPLAINT / HPI:   Atypical arm/chest pain: She has had extensive workup in the past including EKG, chest x-ray, CTA chest, echo, EGD all of which have been unremarkable. She is requesting FMLA as her symptoms are impacting her ability to consistently. She endorses feeling numb alternating with pain over her left chest above her breast after she eats. "That is why they did an endoscopy". This has been going off/on for years. Denies diarrhea. She has tried Pepcid, PPIs, gas-X, nothing works. Belching gives her relief.   Recently diagnosed with pharyngitis (negative rapid strep test). She was prescribed Keflex and magic mouthwash. This has improved with antibiotics. She still endorses chills but notes her daughter has recently been sick as well. Further endorses body chills, headaches and*** which started today. Began coughing last night. Denies shortness of breath.   She started having sharp pains in her head in the left side of her head when she goes from laying down to getting up. Endorses recent dizziness with that. She is eating normally, has not been drinking very much.   PERTINENT  PMH / PSH: Anxiety, atypical chest pain, Crohn's disease  OBJECTIVE:  BP 124/68   Pulse 91   Temp (!) 100.9 F (38.3 C) (Oral)   Ht 5\' 8"  (1.727 m)   Wt 201 lb 9.6 oz (91.4 kg)   LMP 11/30/2022 (Approximate) Comment: spotting  SpO2 98%   BMI 30.65 kg/m  Physical Exam   ASSESSMENT/PLAN:  Anxiety -     hydrOXYzine HCl; Take 1 tablet (10 mg total) by mouth 3 (three) times daily as needed for anxiety.  Dispense: 30 tablet; Refill: 0   No follow-ups on file. Wells Guiles, DO 01/22/2023, 5:38 PM PGY-***, [facility] {    This will disappear when note is signed, click to select method of visit    :1}

## 2023-01-22 NOTE — Telephone Encounter (Signed)
Patient was seen in clinic to day by Dr Madison Hickman. Pt is requesting for Dr. Thompson Grayer to give her a call as the pt feels like Dr Thompson Grayer knows more about her situation than  Dr Madison Hickman. Pt said she has already been down the anxiety route and nothing has helped her. Phone number verified. Ottis Stain, CMA

## 2023-01-22 NOTE — Patient Instructions (Signed)
It was great to see you today! Thank you for choosing [facility] for your primary care. Katherine Lindsey was seen for atypical arm/chest pain.  Today we addressed: I greatly suspect this is anxiety.  I will prescribe you hydroxyzine to take as needed.  I will speak with Dr. Thompson Grayer about the [facility].  I would greatly advise you to get reset up with a counselor given that these symptoms are more prevalent when you are under stress at home or at work.  Please keep a diary of what symptoms you experience and when you experience them as that may help you determine the frequency of these events.  If you haven't already, sign up for My Chart to have easy access to your labs results, and communication with your primary care physician.  Please arrive 15 minutes before your appointment to ensure smooth check in process.  We appreciate your efforts in making this happen.  Thank you for allowing me to participate in your care, Wells Guiles, DO 01/22/2023, 4:13 PM PGY-2, [place]

## 2023-01-22 NOTE — Telephone Encounter (Signed)
I called and verified I was speaking with Katherine Lindsey. We discussed how her symptoms have evolved since last visit, and that she has pain that radiates down her right arm now and is sometimes missing work twice a month. We discussed that we have ruled out other serious cardiac/GI causes but she has not been to neurology yet. We discussed this work up could be negative but I am ok with placing neurology referral today. We discussed if nothing is found on nerve studies this could be related to anxiety, but that doesn't mean the pain isnt real. She is wanting to find a cause, and we discussed how there may not be a cause found with neurology, and that nerves and anxiety can be a cause. We discussed trying the PRN hydroxyzine to see if this helps- and she was agreeable. We also discussed that we cannot fill out FMLA paperwork as we do not have a reason that she is having these spells and no medical diagnosis has been made. She was understanding of this. Previous FMLA was for attending specialist appt and work-up of this. We discussed she could ask neurologist for a note for the date she has an appt to give to work.

## 2023-01-22 NOTE — Telephone Encounter (Signed)
Answered all questions and concerns, and she was understanding and appreciative of the call.  Yehuda Savannah MD

## 2023-01-23 DIAGNOSIS — B349 Viral infection, unspecified: Secondary | ICD-10-CM | POA: Insufficient documentation

## 2023-01-23 NOTE — Assessment & Plan Note (Deleted)
Upon chart review, no formal diagnosis, appears patient reported diagnosed in college and in remission since then. This is highly unlikely to be in remission for such long time.

## 2023-01-23 NOTE — Assessment & Plan Note (Signed)
She does have active viral illness today and slightly febrile to 100.9.  Work note provided for today and yesterday.  Advised continuing completion of Keflex for her resolving pharyngitis and focusing on hydration efforts.

## 2023-01-23 NOTE — Assessment & Plan Note (Signed)
Constellation of symptoms regarding atypical arm/chest pain/numbness.  This was extensively worked up prior and EKG, CXR, CTA chest, echo, EGD have all remained unremarkable.  She has failed to improve with GI measures of Pepcid, PPIs, Gas-X.  Her physical exam remains unremarkable.  There is a possibility of MS however given the symptoms have been present for several years I would have expected worsening of her baseline by now.  Somatic symptom disorder and anxiety are also highly likely although she is hesitant to being started on a consistent medication.  She is amenable to hydroxyzine.  Advised patient FMLA would not be appropriate at this time given no formal diagnosis.  I do recommend consistent use of counseling resources.  Assistance provided by Dr. Thompson Grayer via telephone encounter who placed neurology referral and further advised FMLA not appropriate at this time.

## 2023-02-15 ENCOUNTER — Other Ambulatory Visit: Payer: Self-pay | Admitting: Family Medicine

## 2023-02-15 DIAGNOSIS — R0789 Other chest pain: Secondary | ICD-10-CM

## 2023-02-18 NOTE — Progress Notes (Unsigned)
  SUBJECTIVE:   CHIEF COMPLAINT / HPI:   Numbness: Patient presents for complete left sided numbness  The numbness ranges from her head to her feet with pain in the head as well.  She has numbness in the left arm as well.  No obvious weakness.  No fevers or chills.  The patient reports that she has had this ongoing for quite some time but that it has been worsening for the past few days.  She has had extensive workup in the past including EKG, chest x-ray, CTA chest, echo, EGD for pain.  She had an MRI of the brain in 2022 that was unremarkable.  She reports that she had a referral to neurology from Dr. Thompson Grayer, but neurology has not contacted her.  No nausea, vomiting, chest pain, dyspnea. Affects her ability to work.  Patient did not see any benefit from hydroxyzine. Has weak sight in the left eye.    Requests LDL and A1C  PERTINENT  PMH / PSH:  HPV infection  HS GERD Functional dyspepsia  Crohn's  Anxiety   OBJECTIVE:  BP 126/80   Pulse 67   Ht '5\' 8"'$  (1.727 m)   Wt 201 lb 4 oz (91.3 kg)   SpO2 100%   BMI 30.60 kg/m  Physical Exam  General: Alert and oriented in no apparent distress Heart: Regular rate and rhythm with no murmurs appreciated Lungs: CTA bilaterally, no wheezing Skin: Warm and dry Neuro: CN II: PERRL CN III, IV,VI: EOMI CV V: Normal sensation in V1, V2, V3 CVII: Symmetric smile and brow raise CN VIII: Normal hearing CN IX,X: Symmetric palate raise  CN XI: 5/5 shoulder shrug CN XII: Symmetric tongue protrusion  UE and LE strength 5/5 2+ LE reflexes  Normal sensation in UE and LE bilaterally     ASSESSMENT/PLAN:  Obesity (BMI 30-39.9) -     Hemoglobin A1c -     LDL cholesterol, direct  Numbness and tingling Assessment & Plan: Numbness and tingling seems to have been worsening from last visit.  Reassuringly, completely neurovascularly intact.  Likely psychosomatic in nature, but will continue with further referral to neuro.   Brain MRI in 2022  unremarkable.  Gave the patient information for the referral to [facility] neurology. Continue with Tylenol for pain control.     Return if symptoms worsen or fail to improve. Erskine Emery, MD 02/19/2023, 1:59 PM PGY-2, [place]

## 2023-02-19 ENCOUNTER — Ambulatory Visit (INDEPENDENT_AMBULATORY_CARE_PROVIDER_SITE_OTHER): Payer: BC Managed Care – PPO | Admitting: Student

## 2023-02-19 ENCOUNTER — Encounter: Payer: Self-pay | Admitting: Neurology

## 2023-02-19 VITALS — BP 126/80 | HR 67 | Ht 68.0 in | Wt 201.2 lb

## 2023-02-19 DIAGNOSIS — R200 Anesthesia of skin: Secondary | ICD-10-CM | POA: Diagnosis not present

## 2023-02-19 DIAGNOSIS — R202 Paresthesia of skin: Secondary | ICD-10-CM | POA: Diagnosis not present

## 2023-02-19 DIAGNOSIS — E669 Obesity, unspecified: Secondary | ICD-10-CM | POA: Diagnosis not present

## 2023-02-19 NOTE — Patient Instructions (Addendum)
It was great to see you today! Thank you for choosing [facility] for your primary care. Katherine Lindsey was seen for headaches.  Today we addressed: Call this number  [facility] Address: [address], [place], [place] [address] Phone: [phone]  Take tylenol until you see these people   If you haven't already, sign up for My Chart to have easy access to your labs results, and communication with your primary care physician.  We are checking some labs today. If they are abnormal, I will call you. If they are normal, I will send you a MyChart message (if it is active) or a letter in the mail. If you do not hear about your labs in the next 2 weeks, please call the office. I recommend that you always bring your medications to each appointment as this makes it easy to ensure you are on the correct medications and helps [place] not miss refills when you need them. Call the clinic at [phone] if your symptoms worsen or you have any concerns.  You should return to our clinic Return if symptoms worsen or fail to improve. Please arrive 15 minutes before your appointment to ensure smooth check in process.  We appreciate your efforts in making this happen.  Thank you for allowing me to participate in your care, Erskine Emery, MD 02/19/2023, 11:44 AM PGY-2, [place]

## 2023-02-19 NOTE — Assessment & Plan Note (Signed)
Numbness and tingling seems to have been worsening from last visit.  Reassuringly, completely neurovascularly intact.  Likely psychosomatic in nature, but will continue with further referral to neuro.   Brain MRI in 2022 unremarkable.  Gave the patient information for the referral to [facility] neurology. Continue with Tylenol for pain control.

## 2023-02-20 LAB — LDL CHOLESTEROL, DIRECT: LDL Direct: 143 mg/dL — ABNORMAL HIGH (ref 0–99)

## 2023-02-20 LAB — HEMOGLOBIN A1C
Est. average glucose Bld gHb Est-mCnc: 134 mg/dL
Hgb A1c MFr Bld: 6.3 % — ABNORMAL HIGH (ref 4.8–5.6)

## 2023-02-21 ENCOUNTER — Encounter: Payer: Self-pay | Admitting: Student

## 2023-03-18 ENCOUNTER — Ambulatory Visit: Payer: BC Managed Care – PPO | Admitting: Neurology

## 2023-03-18 ENCOUNTER — Encounter: Payer: Self-pay | Admitting: Neurology

## 2023-03-19 ENCOUNTER — Encounter: Payer: Self-pay | Admitting: Neurology

## 2023-03-19 NOTE — Progress Notes (Unsigned)
Initial neurology clinic note  SERVICE DATE: 03/20/23  Reason for Evaluation: Consultation requested by Lenoria Chime, MD for an opinion regarding numbness and tingling on left face, arm, and leg. My final recommendations will be communicated back to the requesting physician by way of shared medical record or letter to requesting physician via [place] mail.  HPI: This is [name], a 37 y.o. right-handed female with a medical history of Crohn's disease, anxiety who presents to neurology clinic with the chief complaint of numbness and tingling on left face, arm, and leg. The patient is alone today.  Patient has had symptoms maybe for 3 years. She describes a dull pain and numbness on the left side of her face. She also has the dull pain, numbness, and tingling in her left arm and leg. She also thinks it is affecting her vision. It is more a pressure toward her eye, but no changes to vision. She has been to the ED or doctor multiple times. She relates she has had head imaging that did not show anything.   Her symptoms comes and goes. The symptoms can last a couple of days. She has the symptoms a couple of times per week. Light and sound seem to make her symptoms worse (more pressure around the eye). She denies nausea or vomiting. She denies prior history of headaches/migraines. She was given sumatriptan (?in 2021) that she took for a month that did not help per patient. She did not remember this until I brought it up on my chart review.  She has had cardiac work up as well that was normal. She has been told her symptoms were related to anxiety. She felt her anxiety was different though. She was given anxiety medication without relief of symptoms. Her symptoms worry her as she is not sure what is causing it. She has difficulty at work due to symptoms.  She denies symptoms on the right side of the body. She does have some swelling in her hands at times and has occasional joint pain.  She  mentions that she occasionally drinks and will not notice the symptoms at that time. She drinks a couple of glasses of wine a couple of times per week.   She does not report any constitutional symptoms like fever, night sweats, anorexia or unintentional weight loss.  Restrictive diet? Trying to eat healthy Family history of neuropathy/myopathy/neurologic disease? No, including migraines   MEDICATIONS:  Outpatient Encounter Medications as of 03/20/2023  Medication Sig   medroxyPROGESTERone (DEPO-PROVERA) 150 MG/ML injection Inject 150 mg into the muscle every 3 (three) months.   cephALEXin (KEFLEX) 500 MG capsule Take 1 capsule (500 mg total) by mouth 4 (four) times daily. (Patient not taking: Reported on 03/20/2023)   hydrOXYzine (ATARAX) 10 MG tablet Take 1 tablet (10 mg total) by mouth 3 (three) times daily as needed for anxiety. (Patient not taking: Reported on 03/20/2023)   magic mouthwash (lidocaine, diphenhydrAMINE, alum & mag hydroxide) suspension Swish and spit 10 mLs 4 (four) times daily. (Patient not taking: Reported on 03/20/2023)   meloxicam (MOBIC) 15 MG tablet Take daily for 7 days and then as needed for chest-discomfort. Take with food. (Patient not taking: Reported on 03/20/2023)   [DISCONTINUED] promethazine (PHENERGAN) 25 MG tablet Take 1 tablet (25 mg total) by mouth every 8 (eight) hours as needed (headache). (Patient not taking: Reported on 07/14/2020)   [DISCONTINUED] SUMAtriptan (IMITREX) 50 MG tablet Take 1 tablet (50 mg total) by mouth every 2 (two)  hours as needed for migraine. May repeat in 2 hours if headache persists or recurs. (Patient not taking: Reported on 07/14/2020)   No facility-administered encounter medications on file as of 03/20/2023.    PAST MEDICAL HISTORY: Past Medical History:  Diagnosis Date   Anxiety    Chest pain    Crohn's disease in remission    Hasn't had symptoms since 2004   Gallstones    Hammertoe 09/21/2014   History of low transverse cesarean  section 11/28/2015   Vaginal spotting 10/17/2012   VBAC, delivered 03/19/2016    PAST SURGICAL HISTORY: Past Surgical History:  Procedure Laterality Date   CESAREAN SECTION N/A 06/16/2013   Procedure: CESAREAN SECTION;  Surgeon: Donnamae Jude, MD;  Location: [facility];  Service: Obstetrics;  Laterality: N/A;   CHOLECYSTECTOMY  08/28/2012   Procedure: LAPAROSCOPIC CHOLECYSTECTOMY WITH INTRAOPERATIVE CHOLANGIOGRAM;  Surgeon: Madilyn Hook, DO;  Location: [facility];  Service: General;  Laterality: N/A;   INDUCED ABORTION  02/2012    ALLERGIES: No Known Allergies  FAMILY HISTORY: Family History  Problem Relation Age of Onset   Other Neg Hx    Diabetes Neg Hx    Heart disease Neg Hx    Hyperlipidemia Neg Hx    Hypertension Neg Hx    Stroke Neg Hx    Colon cancer Neg Hx    Esophageal cancer Neg Hx    Pancreatic cancer Neg Hx    Stomach cancer Neg Hx     SOCIAL HISTORY: Social History   Tobacco Use   Smoking status: Never   Smokeless tobacco: Never  Vaping Use   Vaping Use: Never used  Substance Use Topics   Alcohol use: Not Currently    Comment: occas   Drug use: Never   Social History   Social History Narrative   Are you right handed or left handed? right   Are you currently employed ? 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 no   Who lives with you? kids   What type of home do you live in: 1 story or 2 story? two    Caffeine none     OBJECTIVE: PHYSICAL EXAM: BP (!) 164/94   Pulse 78   Ht 5\' 8"  (1.727 m)   Wt 199 lb (90.3 kg)   SpO2 100%   BMI 30.26 kg/m   General: General appearance: Awake and alert. No distress. Cooperative with exam.  Skin: No obvious rash or jaundice. Eyes: fundoscopy: bilateral fundi with crisp disc margins, no obvious papilledema HEENT: Atraumatic. Anicteric. Lungs: Non-labored breathing on room air  Extremities: No edema. No obvious deformity.  Psych: Affect  appropriate.  Neurological: Mental Status: Alert. Speech fluent. No pseudobulbar affect Cranial Nerves: CNII: No RAPD. Visual fields grossly intact. CNIII, IV, VI: PERRL. No nystagmus. EOMI. CN V: Facial sensation intact bilaterally to fine touch. CN VII: Facial muscles symmetric and strong. No ptosis at rest. CN VIII: Hearing grossly intact bilaterally. CN IX: No hypophonia. CN X: Palate elevates symmetrically. CN XI: Full strength shoulder shrug bilaterally. CN XII: Tongue protrusion full and midline. No atrophy or fasciculations. No significant dysarthria Motor: Tone is normal. Strength 5/5 in bilateral upper and lower extremities. Reflexes: 2+ throughout Pathological Reflexes: Babinski: flexor response bilaterally Hoffman: absent bilaterally Troemner: absent bilaterally Sensation: Pinprick: Intact in all extremities Vibration: Intact in all extremities Proprioception: Intact in bilateral great toes Coordination: Intact finger-to- nose-finger bilaterally. Romberg negative. Gait: Able to  rise from chair with arms crossed unassisted. Normal, narrow-based gait.  Lab and Test Review: Internal labs: HbA1c (02/19/23): 6.3 CBC (10/01/22): wnl BMP (10/01/22): unremarkable Folate (05/05/21): wnl B12 (05/05/21): 489 TSH (01/06/21): 0.896  Imaging: MRI brain, MRV head (06/30/21): FINDINGS: MRI HEAD WITHOUT CONTRAST   Brain: No acute infarction, hemorrhage, hydrocephalus, extra-axial collection or mass lesion. 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 Mildly prominent pituitary gland with upwardly convex margin, favored within normal limits given patient age/sex.   Vascular: Major arterial flow voids are maintained at the skull base.   Skull and upper cervical spine: Mildly heterogeneous marrow signal without focal marrow replacing lesion.   Sinuses/Orbits: Clear sinuses.  No acute orbital  findings.   Other: No sizable mastoid effusions.   MR VENOGRAM WITHOUT AND WITH CONTRAST   No evidence of dural venous sinus thrombosis. Incidental developmental venous anomaly in the high left frontal lobe.   IMPRESSION: 1. Normal brain MRI.  No acute infarct. 2. No evidence of dural venous sinus thrombosis.  ASSESSMENT: Katherine Lindsey is a 37 y.o. female who presents for evaluation of dull left sided head pain, especially near the eyes with associated left sided numbness (face, neck, arm, leg). She has a relevant medical history of Crohn's disease and anxiety. Her neurological examination is essentially normal today. Available diagnostic data is significant for normal MRI in 2022 (after symptom onset), normal B12, and HbA1c of 6.3 (pre-diabetes). The etiology of patient's symptoms is not clear. Numbness of left face, arm, and leg would have to localize to the brain, but the MRI brain was normal. It is possible that symptoms are consistent with migraine, though patient does not recognize head pain as clear headache and sumatriptan tried in the past did not help. A functional neurologic deficit is also possible. I will send blood work and trial nortriptyline (possible migraine and anxiety/mood benefit) for symptom relief.  PLAN: -Blood work: ANA, ENA, B1 -Will trial nortriptyline 10 mg qhs, then increase 20 mg qhs after 1 week  -Return to clinic in 2-3 months  The impression above as well as the plan as outlined below were extensively discussed with the patient who voiced understanding. All questions were answered to their satisfaction.  When available, results of the above investigations and possible further recommendations will be communicated to the patient via telephone/MyChart. Patient to call office if not contacted after expected testing turnaround time.   Total time spent reviewing records, interview, history/exam, documentation, and coordination of care on day of encounter:   55 min   Thank you for allowing me to participate in patient's care.  If I can answer any additional questions, I would be pleased to do so.  Kai Levins, MD   CC: Wells Guiles, DO [place] [id number]  CC: Referring provider: Lenoria Chime, [address],  [place] [id number]

## 2023-03-20 ENCOUNTER — Encounter: Payer: Self-pay | Admitting: Neurology

## 2023-03-20 ENCOUNTER — Ambulatory Visit: Payer: BC Managed Care – PPO | Admitting: Neurology

## 2023-03-20 ENCOUNTER — Other Ambulatory Visit (INDEPENDENT_AMBULATORY_CARE_PROVIDER_SITE_OTHER): Payer: BC Managed Care – PPO

## 2023-03-20 VITALS — BP 128/72 | HR 78 | Ht 68.0 in | Wt 199.0 lb

## 2023-03-20 DIAGNOSIS — R200 Anesthesia of skin: Secondary | ICD-10-CM

## 2023-03-20 DIAGNOSIS — G43009 Migraine without aura, not intractable, without status migrainosus: Secondary | ICD-10-CM | POA: Diagnosis not present

## 2023-03-20 MED ORDER — NORTRIPTYLINE HCL 10 MG PO CAPS: 20.0000 mg | ORAL_CAPSULE | Freq: Every day | ORAL | 3 refills | Status: DC

## 2023-03-20 NOTE — Patient Instructions (Signed)
I am not sure the cause of your symptoms. Your MRI brain looks normal without explanation of your symptoms.   This could be headache or migraine related.   I would like to get blood work today. I will be in touch when I have the results.  I want to try a migraine/headache prevention medication to see if this helps with your symptoms. I have sent nortriptyline to your pharmacy. You will take 10 mg (1 capsule) for 1 week at bedtime, then increase to 20 mg (2 capsules) thereafter at bedtime. It will likely take a month to know if the medication is working. If it helps some, we may need to increase the medication further.  I want to see you back in clinic in 2-3 months to check back on your symptoms.  Please let me know if you have any questions or concerns in the meantime.   The physicians and staff at [facility] are committed to providing excellent care. You may receive a survey requesting feedback about your experience at our office. We strive to receive "very good" responses to the survey questions. If you feel that your experience would prevent you from giving the office a "very good " response, please contact our office to try to remedy the situation. We may be reached at [phone]. Thank you for taking the time out of your busy day to complete the survey.  Kai Levins, MD [facility]

## 2023-03-23 LAB — ANA+ENA+DNA/DS+SCL 70+SJOSSA/B
ANA Titer 1: NEGATIVE
ENA RNP Ab: <0.2 AI (ref 0.0–0.9)
ENA SM Ab Ser-aCnc: <0.2 AI (ref 0.0–0.9)
ENA SSA (RO) Ab: <0.2 AI (ref 0.0–0.9)
ENA SSB (LA) Ab: <0.2 AI (ref 0.0–0.9)
Scleroderma (Scl-70) (ENA) Antibody, IgG: <0.2 AI (ref 0.0–0.9)
dsDNA Ab: 1 IU/mL (ref 0–9)

## 2023-03-23 LAB — VITAMIN B1: Vitamin B1 (Thiamine): <6 nmol/L — ABNORMAL LOW (ref 8–30)

## 2023-03-25 ENCOUNTER — Telehealth: Payer: Self-pay

## 2023-03-25 ENCOUNTER — Encounter: Payer: Self-pay | Admitting: Neurology

## 2023-03-25 NOTE — Telephone Encounter (Signed)
Per  Dr.Hill, Thiamine (B1) deficiency can cause neurologic symptoms, but it is hard to know if this is the cause of all her symptoms. It will not help and may cause more symptoms if her B1 continued to be low. It is unusual that symptoms are only on one side. I would recommend continuing the Nortriptyline if it is not causing side effects. I am trying this medication to see if it resolves symptoms. There is no other testing needed at this time since she already had an MRI of her brain that was normal.  Jacquelyne Balint, MD   No answer, LMOVM as well sent a mychart message.

## 2023-03-25 NOTE — Telephone Encounter (Signed)
Per Patient mychart message, Please give her call in regrads to her labs results.     Per Dr.Hill note,Katherine Lindsey,   Your B1 was low. You should take B1 100 mg every day to supplement. This can be bought over the counter at any drug store or online. Low B1 can cause neurologic symptoms, so it is important to take this to prevent more symptoms.   Best, Jacquelyne Balint, MD [facility]    Patient wants to know is the reason for her symptoms? If takes this will it help?  Do she have to continue taking the Nortriptyline as well. Do she need more testing to pin point the cause of her symptoms?

## 2023-03-26 NOTE — Telephone Encounter (Signed)
Pt left message with AN stating she is returning a call from office.

## 2023-03-26 NOTE — Telephone Encounter (Signed)
Spoke to patient. She is having right eye pressure/pain and right sided numbness. It is similar to previous symptoms only on the right instead of the left. She has not taken anything for it. I recommended over the counter NSAID and to treat as headache.  She just started nortriptyline and is currently on 10 mg qhs with plans to increase to 20 mg qhs after 1 week. She is tolerating this well. She has also started B1 supplementation as recommended.  All questions were answered.  Jacquelyne Balint, MD [facility]

## 2023-03-26 NOTE — Telephone Encounter (Signed)
Patient advised of Dr.Hill note.  Per she woke to having pressure behind her right eye with the numbness as well. Nothing on the left right now.   Please advise.

## 2023-03-27 ENCOUNTER — Ambulatory Visit (INDEPENDENT_AMBULATORY_CARE_PROVIDER_SITE_OTHER): Payer: BC Managed Care – PPO

## 2023-03-27 ENCOUNTER — Other Ambulatory Visit: Payer: Self-pay

## 2023-03-27 VITALS — BP 126/79 | HR 65 | Ht 69.0 in | Wt 197.9 lb

## 2023-03-27 DIAGNOSIS — Z3042 Encounter for surveillance of injectable contraceptive: Secondary | ICD-10-CM

## 2023-03-27 MED ORDER — MEDROXYPROGESTERONE ACETATE 150 MG/ML IM SUSP
150.0000 mg | Freq: Once | INTRAMUSCULAR | Status: AC
Start: 2023-03-27 — End: 2023-03-27
  Administered 2023-03-27: 150 mg via INTRAMUSCULAR

## 2023-03-27 NOTE — Progress Notes (Signed)
Debbe Bales here for Depo-Provera Injection. Patient received last depo injection on 01/08/23; patient is within window for next injection at [redacted]w[redacted]d since last injection. Injection administered without complication. Patient will return in 3 months for next injection between June 26th and July 10th. Next annual visit due 10/12/23.   Meryl Crutch, RN 03/27/2023  8:36 AM

## 2023-04-02 ENCOUNTER — Ambulatory Visit: Payer: BC Managed Care – PPO | Admitting: Neurology

## 2023-04-03 ENCOUNTER — Emergency Department (HOSPITAL_COMMUNITY): Payer: BC Managed Care – PPO

## 2023-04-03 ENCOUNTER — Encounter (HOSPITAL_COMMUNITY): Payer: Self-pay

## 2023-04-03 ENCOUNTER — Other Ambulatory Visit: Payer: Self-pay

## 2023-04-03 ENCOUNTER — Emergency Department (HOSPITAL_COMMUNITY)
Admission: EM | Admit: 2023-04-03 | Discharge: 2023-04-03 | Disposition: A | Discharge status: Home / Self Care | Payer: BC Managed Care – PPO | Attending: Emergency Medicine | Admitting: Emergency Medicine

## 2023-04-03 DIAGNOSIS — R0789 Other chest pain: Secondary | ICD-10-CM | POA: Diagnosis not present

## 2023-04-03 DIAGNOSIS — R519 Headache, unspecified: Secondary | ICD-10-CM | POA: Diagnosis not present

## 2023-04-03 DIAGNOSIS — R202 Paresthesia of skin: Secondary | ICD-10-CM | POA: Diagnosis not present

## 2023-04-03 DIAGNOSIS — R079 Chest pain, unspecified: Secondary | ICD-10-CM | POA: Insufficient documentation

## 2023-04-03 DIAGNOSIS — R200 Anesthesia of skin: Secondary | ICD-10-CM | POA: Diagnosis not present

## 2023-04-03 LAB — TROPONIN I (HIGH SENSITIVITY)
Troponin I (High Sensitivity): 3 ng/L (ref <18)
Troponin I (High Sensitivity): 3 ng/L (ref <18)

## 2023-04-03 LAB — BASIC METABOLIC PANEL
Anion gap: 9 (ref 5–15)
BUN: 11 mg/dL (ref 6–20)
CO2: 22 mmol/L (ref 22–32)
Calcium: 9.6 mg/dL (ref 8.9–10.3)
Chloride: 106 mmol/L (ref 98–111)
Creatinine, Ser: 0.84 mg/dL (ref 0.44–1.00)
GFR, Estimated: >60 mL/min (ref >60)
Glucose, Bld: 105 mg/dL — ABNORMAL HIGH (ref 70–99)
Potassium: 3.7 mmol/L (ref 3.5–5.1)
Sodium: 137 mmol/L (ref 135–145)

## 2023-04-03 LAB — CBC WITH DIFFERENTIAL/PLATELET
Abs Immature Granulocytes: 0.02 10*3/uL (ref 0.00–0.07)
Basophils Absolute: 0.1 10*3/uL (ref 0.0–0.1)
Basophils Relative: 1 %
Eosinophils Absolute: 0.1 10*3/uL (ref 0.0–0.5)
Eosinophils Relative: 1 %
HCT: 38.6 % (ref 36.0–46.0)
Hemoglobin: 12.8 g/dL (ref 12.0–15.0)
Immature Granulocytes: 0 %
Lymphocytes Relative: 31 %
Lymphs Abs: 2.6 10*3/uL (ref 0.7–4.0)
MCH: 29.6 pg (ref 26.0–34.0)
MCHC: 33.2 g/dL (ref 30.0–36.0)
MCV: 89.1 fL (ref 80.0–100.0)
Monocytes Absolute: 0.5 10*3/uL (ref 0.1–1.0)
Monocytes Relative: 6 %
Neutro Abs: 5 10*3/uL (ref 1.7–7.7)
Neutrophils Relative %: 61 %
Platelets: 351 10*3/uL (ref 150–400)
RBC: 4.33 MIL/uL (ref 3.87–5.11)
RDW: 13.3 % (ref 11.5–15.5)
WBC: 8.3 10*3/uL (ref 4.0–10.5)
nRBC: 0 % (ref 0.0–0.2)

## 2023-04-03 LAB — HCG, QUANTITATIVE, PREGNANCY: hCG, Beta Chain, Quant, S: <1 m[IU]/mL (ref <5)

## 2023-04-03 LAB — TSH: TSH: 0.742 u[IU]/mL (ref 0.350–4.500)

## 2023-04-03 MED ORDER — KETOROLAC TROMETHAMINE 15 MG/ML IJ SOLN
15.0000 mg | Freq: Once | INTRAMUSCULAR | Status: AC
  Administered 2023-04-03: 15 mg via INTRAVENOUS
  Filled 2023-04-03: qty 1

## 2023-04-03 NOTE — Discharge Instructions (Addendum)
The blood work, EKG and chest x-ray were all normal today.  No evidence of any problems with your heart.  The scan of your brain was normal and did not show any signs of stroke or tumor or bleeding.  Keep up the good work with your diet and start taking the nortriptyline and continue the B1 daily.  Follow-up with neurology or contact them if you are not tolerating the medication.  You can try to take Tylenol as needed for the pain.

## 2023-04-03 NOTE — ED Triage Notes (Signed)
Patient is here for evaluation of chest pain and feeling numb on the left side. Reports this she has been having this feeling for the past few months, but today it got worse. Reports only history is migraines.

## 2023-04-03 NOTE — ED Provider Notes (Signed)
[place] [facility]MENT AT [facility] Provider Note   CSN: [id number] Arrival date & time: 04/03/23  0846     History  Chief Complaint  Patient presents with   Chest Pain    Katherine Lindsey is a 37 y.o. female.  Patient is a 37 year old female with a history of migraine headaches, Crohn's disease and anxiety who is presenting today with multiple complaints.  She reports she initially came today because of the pain in her chest that woke her up this morning.  She reports it is in the left side of her chest and she feels it intermittently in her arm.  She has not had nausea or vomiting, she denies significant shortness of breath.  She has not had recent cough or congestion.  She reports the pain is not pleuritic but it is slightly tender with palpation.  No history of tobacco use or asthma.  She denies having a lot of stomach issues or reflux symptoms in the past.  She does report having chest pain in the past but states this feels a little bit different.  She is on Depo for the last 12 years but does not take any estrogen containing products.  No family history of PEs or DVTs and no unilateral leg pain or swelling. Secondly patient is complaining about ongoing headache with numbness to the left side of her face and arm which has been present now for months.  Sometimes the headache moves over to the right side of her head as well.  She feels like it is affecting her vision at times as well and will get blurry.  She followed up with the neurologist with [facility] last week and was evaluated based on their note for the same type of symptoms.  They did send blood work and found that her B1 level was very low but inflammatory markers for rheumatoid arthritis and otherwise were all negative.  A1c was 6.3 about a month ago.  She was started on nortriptyline and B1 but she has not started the nortriptyline regularly yet but has been taking the B1 for about a week.  Her last menses was  12 years ago and she does not get it with the shot.  She does not have any urinary or bowel issues at this time.  She had an MRI 2 years ago which was normal without any lesions or concerning findings.  The history is provided by the patient and medical records.  Chest Pain      Home Medications Prior to Admission medications   Medication Sig Start Date End Date Taking? Authorizing Provider  cephALEXin (KEFLEX) 500 MG capsule Take 1 capsule (500 mg total) by mouth 4 (four) times daily. Patient not taking: Reported on 03/20/2023 01/19/23   Margarita Grizzle, MD  hydrOXYzine (ATARAX) 10 MG tablet Take 1 tablet (10 mg total) by mouth 3 (three) times daily as needed for anxiety. Patient not taking: Reported on 03/20/2023 01/22/23   Shelby Mattocks, DO  magic mouthwash (lidocaine, diphenhydrAMINE, alum & mag hydroxide) suspension Swish and spit 10 mLs 4 (four) times daily. Patient not taking: Reported on 03/20/2023 01/20/23   Ellsworth Lennox, PA-C  medroxyPROGESTERone (DEPO-PROVERA) 150 MG/ML injection Inject 150 mg into the muscle every 3 (three) months.    [provider]  meloxicam (MOBIC) 15 MG tablet Take daily for 7 days and then as needed for chest-discomfort. Take with food. Patient not taking: Reported on 03/20/2023 01/08/23   Sabino Dick, DO  nortriptyline (  PAMELOR) 10 MG capsule Take 2 capsules (20 mg total) by mouth at bedtime. Take 1 capsule (10 mg) for 1 week at bedtime, then increase to 2 capsules (20 mg) at bedtime thereafter. 03/20/23   Antony Madura, MD  promethazine (PHENERGAN) 25 MG tablet Take 1 tablet (25 mg total) by mouth every 8 (eight) hours as needed (headache). Patient not taking: Reported on 07/14/2020 03/28/20 08/16/20  Gilda Crease, MD  SUMAtriptan (IMITREX) 50 MG tablet Take 1 tablet (50 mg total) by mouth every 2 (two) hours as needed for migraine. May repeat in 2 hours if headache persists or recurs. Patient not taking: Reported on 07/14/2020 01/27/20 08/16/20   Gailen Shelter, PA      Allergies    Patient has no known allergies.    Review of Systems   Review of Systems  Cardiovascular:  Positive for chest pain.    Physical Exam Updated Vital Signs BP 122/78   Pulse 60   Temp 98.9 F (37.2 C) (Oral)   Resp 12   Ht 5\' 9"  (1.753 m)   Wt 89.8 kg   LMP 03/25/2023 (Approximate) Comment: spotting for about 1 day; per patient, doesnt really have full periods  SpO2 100%   BMI 29.24 kg/m  Physical Exam Vitals and nursing note reviewed.  Constitutional:      General: She is not in acute distress.    Appearance: She is well-developed.  HENT:     Head: Normocephalic and atraumatic.  Eyes:     Conjunctiva/sclera: Conjunctivae normal.     Pupils: Pupils are equal, round, and reactive to light.  Cardiovascular:     Rate and Rhythm: Normal rate and regular rhythm.     Pulses: Normal pulses.     Heart sounds: No murmur heard. Pulmonary:     Effort: Pulmonary effort is normal. No respiratory distress.     Breath sounds: Normal breath sounds. No wheezing or rales.  Chest:     Chest wall: Tenderness present.  Abdominal:     General: There is no distension.     Palpations: Abdomen is soft.     Tenderness: There is no abdominal tenderness. There is no guarding or rebound.  Musculoskeletal:        General: No tenderness. Normal range of motion.     Cervical back: Normal range of motion and neck supple.  Skin:    General: Skin is warm and dry.     Findings: No erythema or rash.  Neurological:     Mental Status: She is alert and oriented to person, place, and time.     Cranial Nerves: No dysarthria or facial asymmetry.     Motor: No weakness.     Coordination: Coordination is intact.     Gait: Gait is intact.     Comments: Subjective decreased sensation in the left side of the face  Psychiatric:        Behavior: Behavior normal.     Comments: Flat affect     ED Results / Procedures / Treatments   Labs (all labs ordered are  listed, but only abnormal results are displayed) Labs Reviewed  BASIC METABOLIC PANEL - Abnormal; Notable for the following components:      Result Value   Glucose, Bld 105 (*)    All other components within normal limits  CBC WITH DIFFERENTIAL/PLATELET  TSH  HCG, QUANTITATIVE, PREGNANCY  TROPONIN I (HIGH SENSITIVITY)  TROPONIN I (HIGH SENSITIVITY)    EKG EKG Interpretation  Date/Time:  Wednesday April 03 2023 08:54:56 EDT Ventricular Rate:  77 PR Interval:  144 QRS Duration: 86 QT Interval:  384 QTC Calculation: 435 R Axis:   79 Text Interpretation: Sinus rhythm Low voltage, precordial leads No significant change since last tracing Confirmed by Gwyneth Sprout ([id number]) on 04/03/2023 9:56:01 AM  Radiology CT Head Wo Contrast  Result Date: 04/03/2023 CLINICAL DATA:  Headache.  Numbness on left side EXAM: CT HEAD WITHOUT CONTRAST TECHNIQUE: Contiguous axial images were obtained from the base of the skull through the vertex without intravenous contrast. RADIATION DOSE REDUCTION: This exam was performed according to the departmental dose-optimization program which includes automated exposure control, adjustment of the mA and/or kV according to patient size and/or use of iterative reconstruction technique. COMPARISON:  CT Orbits 08/16/21, MR Head 06/30/21 FINDINGS: Brain: No evidence of acute infarction, hemorrhage, hydrocephalus, extra-axial collection or mass lesion/mass effect. Vascular: No hyperdense vessel or unexpected calcification. Skull: Normal. Negative for fracture or focal lesion. Sinuses/Orbits: No middle ear or mastoid effusion. Paranasal sinuses are clear. Orbits are unremarkable. 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 Other: None. IMPRESSION: No acute intracranial abnormality. Electronically Signed   By: Lorenza Cambridge M.D.   On: 04/03/2023 11:00   DG Chest Port 1 View  Result Date: 04/03/2023 CLINICAL DATA:  Chest pain.  Numbness on the left side. EXAM: PORTABLE  CHEST 1 VIEW COMPARISON:  None Available. FINDINGS: The heart size and mediastinal contours are within normal limits. Both lungs are clear. The visualized skeletal structures are unremarkable. IMPRESSION: Negative one-view chest x-ray Electronically Signed   By: Marin Roberts M.D.   On: 04/03/2023 09:51    Procedures Procedures    Medications Ordered in ED Medications  ketorolac (TORADOL) 15 MG/ML injection 15 mg (15 mg Intravenous Given 04/03/23 1004)    ED Course/ Medical Decision Making/ A&P                             Medical Decision Making Amount and/or Complexity of Data Reviewed External Data Reviewed: notes.    Details: neurology Labs: ordered. Decision-making details documented in ED Course. Radiology: ordered and independent interpretation performed. Decision-making details documented in ED Course. ECG/medicine tests: ordered and independent interpretation performed. Decision-making details documented in ED Course.  Risk Prescription drug management.   Pt with multiple medical problems and comorbidities and presenting today with a complaint that caries a high risk for morbidity and mortality.  Here today with complaints of chest pain which are nonspecific.  Woke her from sleep but low risk Wells and PERC negative, Heart score of 0 with low suspicion for ACS at this time.  Patient does have pain with palpation feel it is most likely musculoskeletal in nature.  Breath sounds are clear no history of asthma, low suspicion for pneumonia.  No EKG findings to suggest pericarditis and low suspicion for pericardial effusion.  Patient not complaining of GI issues at this time.   Secondly patient is here complaining of ongoing headache and neuro complaints.  She is recently seen neurology for this about a week ago.  She did have undetectable B1 levels which she started on B1 supplement 1 week ago but had normal rheumatoid factors.  Patient had an MRI done 2 years ago due to similar  complaints that was negative.  She did not initially come for this complaint it was poor that chest pain appears to be following with a neurologist to recently started  on new medication that she has not started yet.  She has no focal findings on exam that would negate she needs to have an MRI today.  Patient does have known vitamin deficiency.  Symptoms are not classic for stroke or head bleed.  Did not have any type of MS lesion on her last MRI 2 years ago when she was having similar symptoms.  Symptoms are not classic for IIH and low suspicion for vertebral dissection. I independently interpreted patient's EKG and labs.  EKG is normal, CBC, thyroid, troponin are all within normal limits. Pt given toradol for chest pain.    12:12 PM I have independently visualized and interpreted pt's images today.  Chest x-ray and head CT are negative today.  Findings discussed with the patient.  At this time encouraged her to follow the current plan that she has with her neurologist.  If her symptoms do not improve with vitamin replacement she may need recurrent MRIs but we will let her follow-up with neurology as planned.  Blood pressure is normal here.  No findings to explain why she is having chest pain.  Could be musculoskeletal in nature.  Does not seem to be GI.          Final Clinical Impression(s) / ED Diagnoses Final diagnoses:  Nonspecific chest pain  Paresthesias    Rx / DC Orders ED Discharge Orders     None         Gwyneth Sprout, MD 04/03/23 1212

## 2023-04-22 ENCOUNTER — Ambulatory Visit: Payer: BC Managed Care – PPO | Admitting: Neurology

## 2023-04-25 ENCOUNTER — Emergency Department (HOSPITAL_COMMUNITY): Payer: BC Managed Care – PPO

## 2023-04-25 ENCOUNTER — Emergency Department (HOSPITAL_COMMUNITY)
Admission: EM | Admit: 2023-04-25 | Discharge: 2023-04-25 | Disposition: A | Discharge status: Home / Self Care | Payer: BC Managed Care – PPO | Attending: Emergency Medicine | Admitting: Emergency Medicine

## 2023-04-25 ENCOUNTER — Other Ambulatory Visit: Payer: Self-pay

## 2023-04-25 DIAGNOSIS — R531 Weakness: Secondary | ICD-10-CM | POA: Diagnosis not present

## 2023-04-25 DIAGNOSIS — R202 Paresthesia of skin: Secondary | ICD-10-CM | POA: Insufficient documentation

## 2023-04-25 DIAGNOSIS — R079 Chest pain, unspecified: Secondary | ICD-10-CM | POA: Diagnosis not present

## 2023-04-25 DIAGNOSIS — R200 Anesthesia of skin: Secondary | ICD-10-CM | POA: Diagnosis not present

## 2023-04-25 DIAGNOSIS — M79609 Pain in unspecified limb: Secondary | ICD-10-CM

## 2023-04-25 LAB — BASIC METABOLIC PANEL
Anion gap: 9 (ref 5–15)
BUN: 7 mg/dL (ref 6–20)
CO2: 21 mmol/L — ABNORMAL LOW (ref 22–32)
Calcium: 9.8 mg/dL (ref 8.9–10.3)
Chloride: 106 mmol/L (ref 98–111)
Creatinine, Ser: 0.71 mg/dL (ref 0.44–1.00)
GFR, Estimated: >60 mL/min (ref >60)
Glucose, Bld: 86 mg/dL (ref 70–99)
Potassium: 3.6 mmol/L (ref 3.5–5.1)
Sodium: 136 mmol/L (ref 135–145)

## 2023-04-25 LAB — CBC
HCT: 40.5 % (ref 36.0–46.0)
Hemoglobin: 13.3 g/dL (ref 12.0–15.0)
MCH: 29.4 pg (ref 26.0–34.0)
MCHC: 32.8 g/dL (ref 30.0–36.0)
MCV: 89.4 fL (ref 80.0–100.0)
Platelets: 296 10*3/uL (ref 150–400)
RBC: 4.53 MIL/uL (ref 3.87–5.11)
RDW: 13.2 % (ref 11.5–15.5)
WBC: 6.9 10*3/uL (ref 4.0–10.5)
nRBC: 0 % (ref 0.0–0.2)

## 2023-04-25 LAB — CBG MONITORING, ED
Glucose-Capillary: 39 mg/dL — CL (ref 70–99)
Glucose-Capillary: 85 mg/dL (ref 70–99)

## 2023-04-25 LAB — URINALYSIS, ROUTINE W REFLEX MICROSCOPIC
Bilirubin Urine: NEGATIVE
Glucose, UA: NEGATIVE mg/dL
Hgb urine dipstick: NEGATIVE
Ketones, ur: 5 mg/dL — AB
Leukocytes,Ua: NEGATIVE
Nitrite: NEGATIVE
Protein, ur: NEGATIVE mg/dL
Specific Gravity, Urine: 1.013 (ref 1.005–1.030)
pH: 6 (ref 5.0–8.0)

## 2023-04-25 LAB — HCG, QUANTITATIVE, PREGNANCY: hCG, Beta Chain, Quant, S: <1 m[IU]/mL (ref <5)

## 2023-04-25 LAB — MAGNESIUM: Magnesium: 2.2 mg/dL (ref 1.7–2.4)

## 2023-04-25 LAB — PHOSPHORUS: Phosphorus: 3.1 mg/dL (ref 2.5–4.6)

## 2023-04-25 LAB — TROPONIN I (HIGH SENSITIVITY): Troponin I (High Sensitivity): <2 ng/L (ref <18)

## 2023-04-25 LAB — VITAMIN B12: Vitamin B-12: 384 pg/mL (ref 180–914)

## 2023-04-25 NOTE — ED Triage Notes (Signed)
Pt reports chest pressure and feeling numb on left side of body x 3 days. Reports she has had this problem for a while now but more severe in the last couple of days.

## 2023-04-25 NOTE — ED Notes (Signed)
BS will be rechecked. Results did not have enough waste when drawn back from IV that was placed

## 2023-04-25 NOTE — ED Provider Notes (Signed)
[place] EMERGENC[facility] DEPARTMENT AT [facility] Provider Note   CSN: [id number] Arrival date & time: 04/25/23  1030     History  Chief Complaint  Patient presents with   Weakness    Katherine Lindsey is a 37 y.o. female.  The history is provided by the patient and medical records. No language interpreter was used.  Weakness    37 year old female significant history of Crohn's disease and remission, anxiety, GERD, presenting with complaints of numbness.  Patient report for the past 3 days she has had a numbness and pain sensation to her left side of head neck and left chest and arm.  She feels the pain is more of an achy sensation with tingling sensation to her extremities.  No associated weakness with it.  She reported symptoms seems to be progressively worse.  States that she has had similar symptoms like this for about a year that comes and goes.  States that she has been seen by neurologist outpatient in the past and was prescribed amitriptyline for symptoms.  States that she may need an MRI at some point.  States she had brain MRI 2 years ago that was normal.  She denies increasing stress, denies change in diet.  Denies any recent trauma.  Denies having fever or chills no diplopia complaints of vision no nausea vomiting diarrhea.    Home Medications Prior to Admission medications   Medication Sig Start Date End Date Taking? Authorizing Provider  cephALEXin (KEFLEX) 500 MG capsule Take 1 capsule (500 mg total) by mouth 4 (four) times daily. Patient not taking: Reported on 03/20/2023 01/19/23   Margarita Grizzle, MD  hydrOXYzine (ATARAX) 10 MG tablet Take 1 tablet (10 mg total) by mouth 3 (three) times daily as needed for anxiety. Patient not taking: Reported on 03/20/2023 01/22/23   Shelby Mattocks, DO  magic mouthwash (lidocaine, diphenhydrAMINE, alum & mag hydroxide) suspension Swish and spit 10 mLs 4 (four) times daily. Patient not taking: Reported on 03/20/2023 01/20/23   Ellsworth Lennox, PA-C  medroxyPROGESTERone (DEPO-PROVERA) 150 MG/ML injection Inject 150 mg into the muscle every 3 (three) months.    [provider]  meloxicam (MOBIC) 15 MG tablet Take daily for 7 days and then as needed for chest-discomfort. Take with food. Patient not taking: Reported on 03/20/2023 01/08/23   Sabino Dick, DO  nortriptyline (PAMELOR) 10 MG capsule Take 2 capsules (20 mg total) by mouth at bedtime. Take 1 capsule (10 mg) for 1 week at bedtime, then increase to 2 capsules (20 mg) at bedtime thereafter. 03/20/23   Antony Madura, MD  promethazine (PHENERGAN) 25 MG tablet Take 1 tablet (25 mg total) by mouth every 8 (eight) hours as needed (headache). Patient not taking: Reported on 07/14/2020 03/28/20 08/16/20  Gilda Crease, MD  SUMAtriptan (IMITREX) 50 MG tablet Take 1 tablet (50 mg total) by mouth every 2 (two) hours as needed for migraine. May repeat in 2 hours if headache persists or recurs. Patient not taking: Reported on 07/14/2020 01/27/20 08/16/20  Gailen Shelter, PA      Allergies    Patient has no known allergies.    Review of Systems   Review of Systems  Neurological:  Positive for weakness.  All other systems reviewed and are negative.   Physical Exam Updated Vital Signs BP (!) 140/96 (BP Location: Right Arm)   Pulse 64   Temp 98.1 F (36.7 C) (Oral)   Resp 16   Ht 5\' 9"  (1.753  m)   Wt 89.4 kg   LMP 03/25/2023 (Approximate) Comment: spotting for about 1 day; per patient, doesnt really have full periods  SpO2 100%   BMI 29.09 kg/m  Physical Exam Vitals and nursing note reviewed.  Constitutional:      General: She is not in acute distress.    Appearance: She is well-developed.  HENT:     Head: Atraumatic.  Eyes:     Extraocular Movements: Extraocular movements intact.     Conjunctiva/sclera: Conjunctivae normal.     Pupils: Pupils are equal, round, and reactive to light.  Neck:     Vascular: No carotid bruit.  Cardiovascular:     Rate  and Rhythm: Normal rate and regular rhythm.     Pulses: Normal pulses.     Heart sounds: Normal heart sounds.  Pulmonary:     Effort: Pulmonary effort is normal.     Breath sounds: No wheezing, rhonchi or rales.  Abdominal:     Palpations: Abdomen is soft.     Tenderness: There is no abdominal tenderness.  Musculoskeletal:     Cervical back: Normal range of motion and neck supple. No rigidity or tenderness.  Lymphadenopathy:     Cervical: No cervical adenopathy.  Skin:    Findings: No rash.  Neurological:     Mental Status: She is alert.     Comments: Neurologic exam:  Speech clear, pupils equal round reactive to light, extraocular movements intact  Normal peripheral visual fields Cranial nerves III through XII normal including no facial droop Follows commands, moves all extremities x4, normal strength to bilateral upper and lower extremities at all major muscle groups including grip Sensation subjectively diminished to LUE compared to RUE Coordination intact, no limb ataxia, finger-nose-finger normal Rapid alternating movements normal No pronator drift Gait normal   Psychiatric:        Mood and Affect: Mood normal.     ED Results / Procedures / Treatments   Labs (all labs ordered are listed, but only abnormal results are displayed) Labs Reviewed  BASIC METABOLIC PANEL - Abnormal; Notable for the following components:      Result Value   CO2 21 (*)    All other components within normal limits  URINALYSIS, ROUTINE W REFLEX MICROSCOPIC - Abnormal; Notable for the following components:   Ketones, ur 5 (*)    All other components within normal limits  CBG MONITORING, ED - Abnormal; Notable for the following components:   Glucose-Capillary 39 (*)    All other components within normal limits  CBC  MAGNESIUM  PHOSPHORUS  VITAMIN B12  HCG, QUANTITATIVE, PREGNANCY  CBG MONITORING, ED  TROPONIN I (HIGH SENSITIVITY)    EKG None  Radiology MR BRAIN WO  CONTRAST  Result Date: 04/25/2023 CLINICAL DATA:  Motor neuron disease suspected.  Weakness. EXAM: MRI HEAD WITHOUT CONTRAST TECHNIQUE: Multiplanar, multiecho pulse sequences of the brain and surrounding structures were obtained without intravenous contrast. COMPARISON:  Head CT 04/03/2023.  MRI brain 06/30/2021. FINDINGS: Brain: No acute infarct or hemorrhage. No mass or midline shift. No hydrocephalus or extra-axial collection. No abnormal susceptibility. Vascular: Normal flow voids. Skull and upper cervical spine: Normal marrow signal. Sinuses/Orbits: Unremarkable. Other: None. IMPRESSION: Normal brain MRI. Electronically Signed   By: Orvan Falconer M.D.   On: 04/25/2023 13:56   DG Chest 2 View  Result Date: 04/25/2023 CLINICAL DATA:  Chest pain. EXAM: CHEST - 2 VIEW COMPARISON:  04/03/2023. FINDINGS: Clear lungs. Normal heart size and mediastinal contours. No  pleural effusion or pneumothorax. Visualized bones and upper abdomen are unremarkable. IMPRESSION: No evidence of acute cardiopulmonary disease. Electronically Signed   By: Orvan Falconer M.D.   On: 04/25/2023 12:29    Procedures Procedures    Medications Ordered in ED Medications - No data to display  ED Course/ Medical Decision Making/ A&P                             Medical Decision Making Amount and/or Complexity of Data Reviewed Labs: ordered. Radiology: ordered.   BP (!) 108/93   Pulse 65   Temp 98.1 F (36.7 C) (Oral)   Resp 20   Ht 5\' 9"  (1.753 m)   Wt 89.4 kg   LMP 03/25/2023 (Approximate) Comment: spotting for about 1 day; per patient, doesnt really have full periods  SpO2 99%   BMI 29.09 kg/m   71:55 AM  37 year old female significant history of Crohn's disease and remission, anxiety, GERD, presenting with complaints of numbness.  Patient report for the past 3 days she has had a numbness and pain sensation to her left side of head neck and left chest and arm.  She feels the pain is more of an achy sensation  with tingling sensation to her extremities.  No associated weakness with it.  She reported symptoms seems to be progressively worse.  States that she has had similar symptoms like this for about a year that comes and goes.  States that she has been seen by neurologist outpatient in the past and was prescribed amitriptyline for symptoms.  States that she may need an MRI at some point.  States she had brain MRI 2 years ago that was normal.  She denies increasing stress, denies change in diet.  Denies any recent trauma.  Denies having fever or chills no diplopia complaints of vision no nausea vomiting diarrhea.   On exam this is an overall well-appearing female resting comfortably in the bed appears to be in no acute discomfort.  Has normocephalic, atraumatic, extraocular movements intact, no facial droop, normal speech normal phonation, normal neck movement, no nuchal rigidity, heart with normal rate and rhythm, lungs clear to auscultation bilaterally abdomen soft nontender, she has 5 out of 5 strength to all 4 extremities with subjective diminished sensation to left upper extremity mention right upper extremity.  Radial pulse 2+.  She has no pronator drift, she has complaint and normal coordination.  Vitals are notable for mildly elevated blood pressure of 140/96.  She is afebrile, no hypoxia.  EMR review, patient has been seen and evaluated multiple times in the ED's for this complaint.  Had brain MRI and MRV done 2 years ago as well as had presentation and it was normal.  She was seen by about neurologist Dr. Loleta Chance earlier this month.  She is unsure of the cause of his symptoms I did prescribe nortriptyline as treatment I suspect patient may have complicated migraine functional neurodeficit.  Due to worsening of symptoms, I will obtain repeat brain MRI for reassessment.  Lab work initiated.  3:08 PM -Labs ordered, independently viewed and interpreted by me.  Labs remarkable for CBG of 39 is in error as  it was drawn at a site that pt received IVF. Recheck CBG is 85.  The remainder of her labs are reassuring -The patient was maintained on a cardiac monitor.  I personally viewed and interpreted the cardiac monitored which showed an underlying rhythm of: NSR -  Imaging independently viewed and interpreted by me and I agree with radiologist's interpretation.  Result remarkable for brain MRI without acute changes.  CXR unremarkable -This patient presents to the ED for concern of paresthesia, this involves an extensive number of treatment options, and is a complaint that carries with it a high risk of complications and morbidity.  The differential diagnosis includes MS, stroke, complicated migraine, electrolytes imbalance, hypoglycemia -Co morbidities that complicate the patient evaluation includes crohn's, anxiety -Treatment includes monitoring -Reevaluation of the patient after these medicines showed that the patient improved -PCP office notes or outside notes reviewed -Escalation to admission/observation considered: patients feels much better, is comfortable with discharge, and will follow up with PCP -Prescription medication considered, patient comfortable with home medication -Social Determinant of Health considered   3:11 PM Brain MRI overall reassuring.  Labs are reassuring as well.  1 documented hypoglycemia is likely an area to evaluate fasting immediate repeat of CBG at a different site and is within normal limit.  At this time the recommendation to follow-up closely with neurologist, Dr. Loleta Chance for outpatient management of her condition.  Continue with nortriptyline as her symptoms could be related to complicated migraine.  I gave patient in return caution.  I have low suspicion for MS, stroke, or other acute emergent medical condition.  .       Final Clinical Impression(s) / ED Diagnoses Final diagnoses:  Paresthesia and pain of left extremity    Rx / DC Orders ED Discharge Orders      None         Fayrene Helper, PA-C 04/25/23 1513    Gwyneth Sprout, MD 04/27/23 [phone]

## 2023-04-25 NOTE — Discharge Instructions (Signed)
You have been evaluated for your symptoms.  Fortunately no concerning findings noted on your brain MRI.  Continue taking nortriptyline and follow-up closely with your neurologist for outpatient care.

## 2023-04-25 NOTE — ED Notes (Signed)
Pt to MRI via WC

## 2023-05-01 ENCOUNTER — Encounter: Payer: Self-pay | Admitting: Student

## 2023-05-08 NOTE — Progress Notes (Signed)
NEUROLOGY FOLLOW UP OFFICE NOTE  CAROLEA BROCHU [id number]  Subjective:  Katherine Lindsey is a 37 y.o. year old  right-handed female with a medical history of Crohn's disease, anxiety who we last saw on 03/20/23.  To briefly review: Patient has had symptoms maybe for 3 years. She describes a dull pain and numbness on the left side of her face. She also has the dull pain, numbness, and tingling in her left arm and leg. She also thinks it is affecting her vision. It is more a pressure toward her eye, but no changes to vision. She has been to the ED or doctor multiple times. She relates she has had head imaging that did not show anything.    Her symptoms comes and goes. The symptoms can last a couple of days. She has the symptoms a couple of times per week. Light and sound seem to make her symptoms worse (more pressure around the eye). She denies nausea or vomiting. She denies prior history of headaches/migraines. She was given sumatriptan (?in 2021) that she took for a month that did not help per patient. She did not remember this until I brought it up on my chart review.   She has had cardiac work up as well that was normal. She has been told her symptoms were related to anxiety. She felt her anxiety was different though. She was given anxiety medication without relief of symptoms. Her symptoms worry her as she is not sure what is causing it. She has difficulty at work due to symptoms.   She denies symptoms on the right side of the body. She does have some swelling in her hands at times and has occasional joint pain.   She mentions that she occasionally drinks and will not notice the symptoms at that time. She drinks a couple of glasses of wine a couple of times per week.    She does not report any constitutional symptoms like fever, night sweats, anorexia or unintentional weight loss.   Restrictive diet? Trying to eat healthy Family history of neuropathy/myopathy/neurologic disease?  No, including migraines  Most recent Assessment and Plan (03/20/23): Her neurological examination is essentially normal today. Available diagnostic data is significant for normal MRI in 2022 (after symptom onset), normal B12, and HbA1c of 6.3 (pre-diabetes). The etiology of patient's symptoms is not clear. Numbness of left face, arm, and leg would have to localize to the brain, but the MRI brain was normal. It is possible that symptoms are consistent with migraine, though patient does not recognize head pain as clear headache and sumatriptan tried in the past did not help. A functional neurologic deficit is also possible. I will send blood work and trial nortriptyline (possible migraine and anxiety/mood benefit) for symptom relief.   PLAN: -Blood work: ANA, ENA, B1 -Will trial nortriptyline 10 mg qhs, then increase 20 mg qhs after 1 week  Since their last visit: B1 was low. I recommended supplementation with B1 100 mg daily.  Patient called on 03/26/23 mentioning pressure behind right eye pressure/pain with numbness of right side (previously on left). I recommended she treat with NSAIDs as she would a HA. She had just begun nortriptyline and B1 supplementation. Patient has been taking B1. Patient started taking nortriptyline around the beginning of 04/2023. She is having infrequent headaches, better than previous. She is having no side effects from the medications.  Patient mentions she used to drink EtOH, but not frequently (2 glasses of wine a week currently).  Patient went to ED on 04/03/23 with chest pain, felt to be most consistent with MSK etiology. Patient also went to ED on 04/25/23 for weakness. She had MRI brain which was normal.  Overall, patient feels like she is doing much better. She has less numbness on the left side.   She mentions her blood pressure has been high lately, as it is in clinic today. It is SBP 140s usually. She has discussed with PCP.  MEDICATIONS:  Outpatient Encounter  Medications as of 05/22/2023  Medication Sig   medroxyPROGESTERone (DEPO-PROVERA) 150 MG/ML injection Inject 150 mg into the muscle every 3 (three) months.   nortriptyline (PAMELOR) 10 MG capsule Take 2 capsules (20 mg total) by mouth at bedtime. Take 1 capsule (10 mg) for 1 week at bedtime, then increase to 2 capsules (20 mg) at bedtime thereafter.   [DISCONTINUED] cephALEXin (KEFLEX) 500 MG capsule Take 1 capsule (500 mg total) by mouth 4 (four) times daily. (Patient not taking: Reported on 03/20/2023)   [DISCONTINUED] hydrOXYzine (ATARAX) 10 MG tablet Take 1 tablet (10 mg total) by mouth 3 (three) times daily as needed for anxiety. (Patient not taking: Reported on 03/20/2023)   [DISCONTINUED] magic mouthwash (lidocaine, diphenhydrAMINE, alum & mag hydroxide) suspension Swish and spit 10 mLs 4 (four) times daily. (Patient not taking: Reported on 03/20/2023)   [DISCONTINUED] meloxicam (MOBIC) 15 MG tablet Take daily for 7 days and then as needed for chest-discomfort. Take with food. (Patient not taking: Reported on 03/20/2023)   [DISCONTINUED] promethazine (PHENERGAN) 25 MG tablet Take 1 tablet (25 mg total) by mouth every 8 (eight) hours as needed (headache). (Patient not taking: Reported on 07/14/2020)   [DISCONTINUED] SUMAtriptan (IMITREX) 50 MG tablet Take 1 tablet (50 mg total) by mouth every 2 (two) hours as needed for migraine. May repeat in 2 hours if headache persists or recurs. (Patient not taking: Reported on 07/14/2020)   No facility-administered encounter medications on file as of 05/22/2023.    PAST MEDICAL HISTORY: Past Medical History:  Diagnosis Date   Anxiety    Chest pain    Crohn's disease in remission [facility])    Hasn't had symptoms since 2004   Gallstones    Hammertoe 09/21/2014   History of low transverse cesarean section 11/28/2015   Vaginal spotting 10/17/2012   VBAC, delivered 03/19/2016    PAST SURGICAL HISTORY: Past Surgical History:  Procedure Laterality Date   CESAREAN  SECTION N/A 06/16/2013   Procedure: CESAREAN SECTION;  Surgeon: Reva Bores, MD;  Location: [facility];  Service: Obstetrics;  Laterality: N/A;   CHOLECYSTECTOMY  08/28/2012   Procedure: LAPAROSCOPIC CHOLECYSTECTOMY WITH INTRAOPERATIVE CHOLANGIOGRAM;  Surgeon: [name], DO;  Location: [facility] OR;  Service: General;  Laterality: N/A;   INDUCED ABORTION  02/2012    ALLERGIES: No Known Allergies  FAMILY HISTORY: Family History  Problem Relation Age of Onset   Other Neg Hx    Diabetes Neg Hx    Heart disease Neg Hx    Hyperlipidemia Neg Hx    Hypertension Neg Hx    Stroke Neg Hx    Colon cancer Neg Hx    Esophageal cancer Neg Hx    Pancreatic cancer Neg Hx    Stomach cancer Neg Hx     SOCIAL HISTORY: Social History   Tobacco Use   Smoking status: Never   Smokeless tobacco: Never  Vaping Use   Vaping Use: Never used  Substance Use Topics   Alcohol use: Not Currently  Comment: occas   Drug use: Never   Social History   Social History Narrative   Are you right handed or left handed? right   Are you currently employed ? 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 no   Who lives with you? kids   What type of home do you live in: 1 story or 2 story? two    Caffeine none      Objective:  Vital Signs:  BP (!) 153/88   Pulse 71   Ht 5\' 8"  (1.727 m)   Wt 194 lb 12.8 oz (88.4 kg)   SpO2 96%   BMI 29.62 kg/m   General: No acute distress.  Patient appears well-groomed.   Head:  Normocephalic/atraumatic Neck: supple, no paraspinal tenderness, full range of motion Heart:  Regular rate and rhythm Lungs:  Clear to auscultation bilaterally Back: No paraspinal tenderness Neurological Exam: alert and oriented.  Speech fluent and not dysarthric, language intact.  CN II-XII intact. Bulk and tone normal, muscle strength 5/5 throughout.  Sensation to light touch intact.  Deep tendon reflexes 2+ throughout, toes downgoing.  Finger to nose  testing intact.  Gait normal, Romberg negative.   Labs and Imaging review: New results: 03/20/23: ANA negative ENA negative B1: < 6  B12 (04/25/23): 384  MRI brain wo contrast (04/25/23): FINDINGS: Brain: No acute infarct or hemorrhage. No mass or midline shift. No hydrocephalus or extra-axial collection. No abnormal susceptibility.   Vascular: Normal flow voids.   Skull and upper cervical spine: Normal marrow signal.   Sinuses/Orbits: Unremarkable.   Other: None.   IMPRESSION: Normal brain MRI.  CT head wo contrast (04/03/23): FINDINGS: Brain: No evidence of acute infarction, hemorrhage, hydrocephalus, extra-axial collection or mass lesion/mass effect.   Vascular: No hyperdense vessel or unexpected calcification.   Skull: Normal. Negative for fracture or focal lesion.   Sinuses/Orbits: No middle ear or mastoid effusion. Paranasal sinuses are clear. Orbits are unremarkable. 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   Other: None.   IMPRESSION: No acute intracranial abnormality.  Previously reviewed results: HbA1c (02/19/23): 6.3 CBC (10/01/22): wnl BMP (10/01/22): unremarkable Folate (05/05/21): wnl B12 (05/05/21): 489 TSH (01/06/21): 0.896   Imaging: MRI brain, MRV head (06/30/21): FINDINGS: MRI HEAD WITHOUT CONTRAST   Brain: No acute infarction, hemorrhage, hydrocephalus, extra-axial collection or mass lesion. 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 Mildly prominent pituitary gland with upwardly convex margin, favored within normal limits given patient age/sex.   Vascular: Major arterial flow voids are maintained at the skull base.   Skull and upper cervical spine: Mildly heterogeneous marrow signal without focal marrow replacing lesion.   Sinuses/Orbits: Clear sinuses.  No acute orbital findings.   Other: No sizable mastoid effusions.   MR VENOGRAM WITHOUT AND  WITH CONTRAST   No evidence of dural venous sinus thrombosis. Incidental developmental venous anomaly in the high left frontal lobe.   IMPRESSION: 1. Normal brain MRI.  No acute infarct. 2. No evidence of dural venous sinus thrombosis.  Assessment/Plan:  This is SKYLIA ANGELICA, a 37 y.o. female with left sided numbness with a prior history of migraines. Her B1 level was low, now on supplementation. She has had MRI brain x2 that was normal. She feels improvement since starting B1 and nortriptlyine, including with less headaches and numbness. The etiology of patient's symptoms is unclear. I would not expect  thiamine (B1) deficiency to affect left side more than right. Symptoms involving the face should localize to the brain, but this has been normal x2. Migraines are possible, though symptoms were not clearly associated with migraine or headache. A functional neurologic deficit is also possible. Fortunately, patient has improved, perhaps with B1 and nortriptyline, so would continue and monitor symptoms.  Plan: -Continue nortriptyline 20 mg qhs -Continue B1 100 mg daily -Blood work: B1 -Follow up with PCP about BP  Return to clinic in 6 months  Total time spent reviewing records, interview, history/exam, documentation, and coordination of care on day of encounter:  30 min  Jacquelyne Balint, MD

## 2023-05-22 ENCOUNTER — Encounter: Payer: Self-pay | Admitting: Neurology

## 2023-05-22 ENCOUNTER — Ambulatory Visit (INDEPENDENT_AMBULATORY_CARE_PROVIDER_SITE_OTHER): Payer: BC Managed Care – PPO | Admitting: Neurology

## 2023-05-22 VITALS — BP 138/84 | HR 71 | Ht 68.0 in | Wt 194.8 lb

## 2023-05-22 DIAGNOSIS — E519 Thiamine deficiency, unspecified: Secondary | ICD-10-CM | POA: Diagnosis not present

## 2023-05-22 DIAGNOSIS — R200 Anesthesia of skin: Secondary | ICD-10-CM

## 2023-05-22 DIAGNOSIS — G43009 Migraine without aura, not intractable, without status migrainosus: Secondary | ICD-10-CM | POA: Diagnosis not present

## 2023-05-22 MED ORDER — NORTRIPTYLINE HCL 10 MG PO CAPS: 20.0000 mg | ORAL_CAPSULE | Freq: Every day | ORAL | 11 refills | Status: DC

## 2023-05-22 NOTE — Patient Instructions (Signed)
-  Continue nortriptyline 20 mg qhs -Continue B1 100 mg daily -Blood work: recheck B1 -Follow up with primary care about blood pressure  Follow up in 6 months

## 2023-06-12 ENCOUNTER — Ambulatory Visit: Payer: BC Managed Care – PPO

## 2023-06-19 ENCOUNTER — Other Ambulatory Visit: Payer: Self-pay

## 2023-06-19 ENCOUNTER — Ambulatory Visit (INDEPENDENT_AMBULATORY_CARE_PROVIDER_SITE_OTHER): Payer: BC Managed Care – PPO

## 2023-06-19 VITALS — BP 133/81 | HR 88 | Ht 68.0 in | Wt 196.3 lb

## 2023-06-19 DIAGNOSIS — Z3042 Encounter for surveillance of injectable contraceptive: Secondary | ICD-10-CM

## 2023-06-19 MED ORDER — MEDROXYPROGESTERONE ACETATE 150 MG/ML IM SUSY
150.0000 mg | PREFILLED_SYRINGE | Freq: Once | INTRAMUSCULAR | Status: AC
  Administered 2023-06-19: 150 mg via INTRAMUSCULAR

## 2023-06-19 NOTE — Progress Notes (Signed)
Debbe Bales here for Depo-Provera Injection. Injection administered without complication. Patient will return in 3 months for next injection between Sept 18 and Oct 2. Next annual visit due 09/2023.   Ralene Bathe, RN 06/19/2023  9:18 AM

## 2023-07-06 ENCOUNTER — Ambulatory Visit
Admission: EM | Admit: 2023-07-06 | Discharge: 2023-07-06 | Disposition: A | Discharge status: Home / Self Care | Payer: BC Managed Care – PPO | Attending: Urgent Care | Admitting: Urgent Care

## 2023-07-06 DIAGNOSIS — H1032 Unspecified acute conjunctivitis, left eye: Secondary | ICD-10-CM

## 2023-07-06 MED ORDER — ERYTHROMYCIN 5 MG/GM OP OINT: TOPICAL_OINTMENT | OPHTHALMIC | 0 refills | Status: DC

## 2023-07-06 NOTE — Discharge Instructions (Signed)
You have pinkeye. THIS IS HIGHLY CONTAGIOUS. Please avoid touching your eye; if you do, [facility] YOUR HANDS! I have prescribed erythromycin eye ointment. Use this on your left eye three times daily x 5 days. If the ointment is too hard to administer, hold it in your warm hands for 5 minutes prior and it will act more like a drop. Warm moist washcloths with Laural Benes and Johnson baby shampoo can be used to gently massage and cleans to eyes. Return to clinic if any fever, eye pain, change in vision.  If no significant improvement in the next 2-3 days, follow up with ophthalmology.

## 2023-07-06 NOTE — ED Provider Notes (Signed)
[facility]    CSN: [id number] Arrival date & time: 07/06/23  [id number]      History   Chief Complaint Chief Complaint  Patient presents with   Conjunctivitis    HPI Katherine Lindsey is a 37 y.o. female.   Pleasant 37 year old female presents to due to concerns of possible conjunctivitis.  She reports a history of the same in the past.  She states she woke up with left eye irritation and redness.  She reports a foreign body sensation.  She does not wear contact lenses.  She denies blurred vision or photophobia.  She denies severe eye pain, but states a slight irritation.  Reports the sclera and conjunctive are extremely red and irritated.  Does have some mild discharge.  She does have mild congestion, but no other URI symptoms.  No fever or swelling of the lids.  No headache.   Conjunctivitis    Past Medical History:  Diagnosis Date   Anxiety    Chest pain    Crohn's disease in remission [facility])    Hasn't had symptoms since 2004   Gallstones    Hammertoe 09/21/2014   History of low transverse cesarean section 11/28/2015   Vaginal spotting 10/17/2012   VBAC, delivered 03/19/2016    Patient Active Problem List   Diagnosis Date Noted   Obesity (BMI 30-39.9) 02/19/2023   Viral illness 01/23/2023   Crohn's disease (HCC) 01/10/2022   Stress at work 10/27/2021   Mass of lower outer quadrant of left breast 10/27/2021   Depot contraception 10/27/2021   Numbness and tingling 05/09/2021   Functional dyspepsia 03/20/2021   Gastroesophageal reflux disease 08/29/2020   Atypical chest pain 08/17/2020   Anxiety 07/15/2019   Hidradenitis suppurativa 07/15/2019   High risk HPV infection 02/07/2017    Past Surgical History:  Procedure Laterality Date   CESAREAN SECTION N/A 06/16/2013   Procedure: CESAREAN SECTION;  Surgeon: Reva Bores, MD;  Location: [facility];  Service: Obstetrics;  Laterality: N/A;   CHOLECYSTECTOMY  08/28/2012   Procedure: LAPAROSCOPIC CHOLECYSTECTOMY  WITH INTRAOPERATIVE CHOLANGIOGRAM;  Surgeon: [name], DO;  Location: [facility] OR;  Service: General;  Laterality: N/A;   INDUCED ABORTION  02/2012    OB History     Gravida  4   Para  3   Term  3   Preterm      AB  1   Living  3      SAB  0   IAB  1   Ectopic      Multiple  0   Live Births  3        Obstetric Comments  2014 LTCS with extension of hysterotomy inferiorly          Home Medications    Prior to Admission medications   Medication Sig Start Date End Date Taking? Authorizing Provider  erythromycin ophthalmic ointment Place a 1/2 inch ribbon of ointment into the left lower eyelid three times daily for 5 days. 07/06/23  Yes Myreon Wimer L, PA  medroxyPROGESTERone (DEPO-PROVERA) 150 MG/ML injection Inject 150 mg into the muscle every 3 (three) months.    [provider]  nortriptyline (PAMELOR) 10 MG capsule Take 2 capsules (20 mg total) by mouth at bedtime. 05/22/23   Antony Madura, MD  promethazine (PHENERGAN) 25 MG tablet Take 1 tablet (25 mg total) by mouth every 8 (eight) hours as needed (headache). Patient not taking: Reported on 07/14/2020 03/28/20 08/16/20  Gilda Crease, MD  SUMAtriptan (IMITREX) 50 MG tablet Take 1 tablet (50 mg total) by mouth every 2 (two) hours as needed for migraine. May repeat in 2 hours if headache persists or recurs. Patient not taking: Reported on 07/14/2020 01/27/20 08/16/20  Gailen Shelter, PA    Family History Family History  Problem Relation Age of Onset   Other Neg Hx    Diabetes Neg Hx    Heart disease Neg Hx    Hyperlipidemia Neg Hx    Hypertension Neg Hx    Stroke Neg Hx    Colon cancer Neg Hx    Esophageal cancer Neg Hx    Pancreatic cancer Neg Hx    Stomach cancer Neg Hx     Social History Social History   Tobacco Use   Smoking status: Never   Smokeless tobacco: Never  Vaping Use   Vaping status: Never Used  Substance Use Topics   Alcohol use: Yes    Comment: occas   Drug use:  Never     Allergies   Patient has no known allergies.   Review of Systems Review of Systems As per HPI  Physical Exam Triage Vital Signs ED Triage Vitals  Encounter Vitals Group     BP 07/06/23 1020 128/86     Systolic BP Percentile --      Diastolic BP Percentile --      Pulse Rate 07/06/23 1020 63     Resp 07/06/23 1020 16     Temp 07/06/23 1020 98 F (36.7 C)     Temp Source 07/06/23 1020 Oral     SpO2 07/06/23 1020 98 %     Weight --      Height --      Head Circumference --      Peak Flow --      Pain Score 07/06/23 1026 8     Pain Loc --      Pain Education --      Exclude from Growth Chart --    No data found.  Updated Vital Signs BP 128/86 (BP Location: Left Arm)   Pulse 63   Temp 98 F (36.7 C) (Oral)   Resp 16   SpO2 98%   Visual Acuity Right Eye Distance:   Left Eye Distance:   Bilateral Distance:    Right Eye Near:   Left Eye Near:    Bilateral Near:     Physical Exam Vitals and nursing note reviewed.  Constitutional:      General: She is not in acute distress.    Appearance: Normal appearance. She is normal weight. She is not ill-appearing or toxic-appearing.  HENT:     Head: Normocephalic and atraumatic. No raccoon eyes, contusion, right periorbital erythema, left periorbital erythema or laceration.     Salivary Glands: Right salivary gland is not diffusely enlarged or tender. Left salivary gland is not diffusely enlarged or tender.     Right Ear: External ear normal.     Left Ear: External ear normal.     Nose: Nose normal. No congestion or rhinorrhea.     Right Turbinates: Not enlarged or swollen.     Left Turbinates: Not enlarged or swollen.     Right Sinus: No maxillary sinus tenderness or frontal sinus tenderness.     Left Sinus: No maxillary sinus tenderness or frontal sinus tenderness.     Mouth/Throat:     Lips: Pink.     Mouth: Mucous membranes are moist.  Pharynx: Oropharynx is clear. No pharyngeal swelling or posterior  oropharyngeal erythema.  Eyes:     General: Lids are normal. Lids are everted, no foreign bodies appreciated. Vision grossly intact. Gaze aligned appropriately. No allergic shiner, visual field deficit or scleral icterus.       Right eye: No foreign body, discharge or hordeolum.        Left eye: No foreign body, discharge or hordeolum.     Extraocular Movements: Extraocular movements intact.     Right eye: Normal extraocular motion and no nystagmus.     Left eye: Normal extraocular motion and no nystagmus.     Conjunctiva/sclera:     Right eye: Right conjunctiva is not injected. No chemosis, exudate or hemorrhage.    Left eye: Left conjunctiva is injected. No chemosis, exudate or hemorrhage.    Pupils: Pupils are equal, round, and reactive to light. Pupils are equal.     Right eye: Pupil is round, reactive and not sluggish.     Left eye: Pupil is round, reactive and not sluggish. No corneal abrasion or fluorescein uptake.     Visual Fields: Right eye visual fields normal and left eye visual fields normal.     Comments: No ciliary flush  Neurological:     Mental Status: She is alert.      UC Treatments / Results  Labs (all labs ordered are listed, but only abnormal results are displayed) Labs Reviewed - No data to display  EKG   Radiology No results found.  Procedures Procedures (including critical care time)  Medications Ordered in UC Medications - No data to display  Initial Impression / Assessment and Plan / UC Course  I have reviewed the triage vital signs and the nursing notes.  Pertinent labs & imaging results that were available during my care of the patient were reviewed by me and considered in my medical decision making (see chart for details).     Acute conjunctivitis L eye - most likely viral, however cannot exclude bacterial given scant discharge. Will recommend warm compresses and emycin ointment. Recommended f/u with eye specialist if sx persist or  worsen.   Final Clinical Impressions(s) / UC Diagnoses   Final diagnoses:  Acute conjunctivitis of left eye, unspecified acute conjunctivitis type     Discharge Instructions      You have pinkeye. THIS IS HIGHLY CONTAGIOUS. Please avoid touching your eye; if you do, [facility] YOUR HANDS! I have prescribed erythromycin eye ointment. Use this on your left eye three times daily x 5 days. If the ointment is too hard to administer, hold it in your warm hands for 5 minutes prior and it will act more like a drop. Warm moist washcloths with Laural Benes and Johnson baby shampoo can be used to gently massage and cleans to eyes. Return to clinic if any fever, eye pain, change in vision.  If no significant improvement in the next 2-3 days, follow up with ophthalmology.     ED Prescriptions     Medication Sig Dispense Auth. Provider   erythromycin ophthalmic ointment Place a 1/2 inch ribbon of ointment into the left lower eyelid three times daily for 5 days. 3.5 g Kylo Gavin L, [place]      PDMP not reviewed this encounter.   Maretta Bees, [place] 07/06/23 1214

## 2023-07-06 NOTE — ED Triage Notes (Signed)
Pt states she woke this am with left eye redness, drainage, pain-NAD-steady gait

## 2023-09-04 NOTE — Progress Notes (Signed)
Katherine Lindsey here for Depo-Provera Injection. Patient received last dose on 06/19/23--patient is [redacted]w[redacted]d since last injection, within window to receive injection today. Injection administered without complication. Patient will return in 3 months for next injection between 11/21/23 and 12/05/23. Next annual visit due 10/12/2023.   Meryl Crutch, RN 09/05/23 at [phone]

## 2023-09-05 ENCOUNTER — Ambulatory Visit (INDEPENDENT_AMBULATORY_CARE_PROVIDER_SITE_OTHER): Payer: BC Managed Care – PPO

## 2023-09-05 VITALS — BP 124/83 | HR 65 | Ht 68.0 in | Wt 198.4 lb

## 2023-09-05 DIAGNOSIS — Z3042 Encounter for surveillance of injectable contraceptive: Secondary | ICD-10-CM | POA: Diagnosis not present

## 2023-09-05 MED ORDER — MEDROXYPROGESTERONE ACETATE 150 MG/ML IM SUSY
150.0000 mg | PREFILLED_SYRINGE | Freq: Once | INTRAMUSCULAR | Status: AC
Start: 2023-09-05 — End: 2023-09-05
  Administered 2023-09-05: 150 mg via INTRAMUSCULAR

## 2023-10-09 ENCOUNTER — Emergency Department (HOSPITAL_COMMUNITY): Payer: BC Managed Care – PPO

## 2023-10-09 ENCOUNTER — Other Ambulatory Visit: Payer: Self-pay

## 2023-10-09 ENCOUNTER — Emergency Department (HOSPITAL_COMMUNITY)
Admission: EM | Admit: 2023-10-09 | Discharge: 2023-10-09 | Disposition: A | Discharge status: Home / Self Care | Payer: BC Managed Care – PPO | Attending: Emergency Medicine | Admitting: Emergency Medicine

## 2023-10-09 ENCOUNTER — Encounter (HOSPITAL_COMMUNITY): Payer: Self-pay

## 2023-10-09 DIAGNOSIS — R0789 Other chest pain: Secondary | ICD-10-CM | POA: Diagnosis not present

## 2023-10-09 DIAGNOSIS — E876 Hypokalemia: Secondary | ICD-10-CM | POA: Diagnosis not present

## 2023-10-09 DIAGNOSIS — M542 Cervicalgia: Secondary | ICD-10-CM | POA: Diagnosis not present

## 2023-10-09 DIAGNOSIS — R079 Chest pain, unspecified: Secondary | ICD-10-CM | POA: Diagnosis not present

## 2023-10-09 LAB — CBC
HCT: 38.4 % (ref 36.0–46.0)
Hemoglobin: 12.5 g/dL (ref 12.0–15.0)
MCH: 29.3 pg (ref 26.0–34.0)
MCHC: 32.6 g/dL (ref 30.0–36.0)
MCV: 89.9 fL (ref 80.0–100.0)
Platelets: 328 10*3/uL (ref 150–400)
RBC: 4.27 MIL/uL (ref 3.87–5.11)
RDW: 12.9 % (ref 11.5–15.5)
WBC: 9.5 10*3/uL (ref 4.0–10.5)
nRBC: 0 % (ref 0.0–0.2)

## 2023-10-09 LAB — BASIC METABOLIC PANEL
Anion gap: 8 (ref 5–15)
BUN: 9 mg/dL (ref 6–20)
CO2: 21 mmol/L — ABNORMAL LOW (ref 22–32)
Calcium: 9.4 mg/dL (ref 8.9–10.3)
Chloride: 106 mmol/L (ref 98–111)
Creatinine, Ser: 0.76 mg/dL (ref 0.44–1.00)
GFR, Estimated: >60 mL/min (ref >60)
Glucose, Bld: 98 mg/dL (ref 70–99)
Potassium: 3.2 mmol/L — ABNORMAL LOW (ref 3.5–5.1)
Sodium: 135 mmol/L (ref 135–145)

## 2023-10-09 LAB — TROPONIN I (HIGH SENSITIVITY): Troponin I (High Sensitivity): <2 ng/L (ref <18)

## 2023-10-09 LAB — HCG, SERUM, QUALITATIVE: Preg, Serum: NEGATIVE

## 2023-10-09 MED ORDER — NAPROXEN 500 MG PO TABS: 500.0000 mg | ORAL_TABLET | Freq: Two times a day (BID) | ORAL | 0 refills | Status: DC

## 2023-10-09 MED ORDER — KETOROLAC TROMETHAMINE 30 MG/ML IJ SOLN
30.0000 mg | Freq: Once | INTRAMUSCULAR | Status: AC
  Administered 2023-10-09: 30 mg via INTRAMUSCULAR
  Filled 2023-10-09: qty 1

## 2023-10-09 MED ORDER — POTASSIUM CHLORIDE CRYS ER 20 MEQ PO TBCR
40.0000 meq | EXTENDED_RELEASE_TABLET | Freq: Once | ORAL | Status: AC
  Administered 2023-10-09: 40 meq via ORAL
  Filled 2023-10-09: qty 2

## 2023-10-09 NOTE — ED Provider Notes (Signed)
[place] EMERGENCY DEPARTMENT AT [facility]  Provider Note  CSN: [id number] Arrival date & time: 10/09/23 1906  History Chief Complaint  Patient presents with   Neck Pain   Chest Pain    Katherine Lindsey is a 37 y.o. female with no significant PMH reports she woke up this morning with left upper chest and L neck soreness. She feels like her lymph nodes are swollen. No fever, cough, SOB, or difficulty swallowing.    Home Medications Prior to Admission medications   Medication Sig Start Date End Date Taking? Authorizing Provider  naproxen (NAPROSYN) 500 MG tablet Take 1 tablet (500 mg total) by mouth 2 (two) times daily. 10/09/23  Yes Pollyann Savoy, MD  erythromycin ophthalmic ointment Place a 1/2 inch ribbon of ointment into the left lower eyelid three times daily for 5 days. Patient not taking: Reported on 09/05/2023 07/06/23   Guy Sandifer L, PA  medroxyPROGESTERone (DEPO-PROVERA) 150 MG/ML injection Inject 150 mg into the muscle every 3 (three) months.    [provider]  nortriptyline (PAMELOR) 10 MG capsule Take 2 capsules (20 mg total) by mouth at bedtime. Patient not taking: Reported on 09/05/2023 05/22/23   Antony Madura, MD  promethazine (PHENERGAN) 25 MG tablet Take 1 tablet (25 mg total) by mouth every 8 (eight) hours as needed (headache). Patient not taking: Reported on 07/14/2020 03/28/20 08/16/20  Gilda Crease, MD  SUMAtriptan (IMITREX) 50 MG tablet Take 1 tablet (50 mg total) by mouth every 2 (two) hours as needed for migraine. May repeat in 2 hours if headache persists or recurs. Patient not taking: Reported on 07/14/2020 01/27/20 08/16/20  Gailen Shelter, PA     Allergies    Patient has no known allergies.   Review of Systems   Review of Systems Please see HPI for pertinent positives and negatives  Physical Exam BP (!) 139/94 (BP Location: Left Arm)   Pulse 79   Temp 97.9 F (36.6 C) (Oral)   Resp 16   Ht 5\' 8"  (1.727  m)   Wt 90.3 kg   SpO2 100%   BMI 30.26 kg/m   Physical Exam Vitals and nursing note reviewed.  Constitutional:      Appearance: Normal appearance.  HENT:     Head: Normocephalic and atraumatic.     Nose: Nose normal.     Mouth/Throat:     Mouth: Mucous membranes are moist.  Eyes:     Extraocular Movements: Extraocular movements intact.     Conjunctiva/sclera: Conjunctivae normal.  Neck:     Vascular: No carotid bruit.     Comments: No thyromegaly Cardiovascular:     Rate and Rhythm: Normal rate.  Pulmonary:     Effort: Pulmonary effort is normal.     Breath sounds: Normal breath sounds.  Chest:     Chest wall: Tenderness (L upper chest) present.  Abdominal:     General: Abdomen is flat.     Palpations: Abdomen is soft.     Tenderness: There is no abdominal tenderness.  Musculoskeletal:        General: No swelling. Normal range of motion.     Cervical back: Neck supple. Tenderness (mild L anterior neck) present.  Lymphadenopathy:     Cervical: No cervical adenopathy.  Skin:    General: Skin is warm and dry.  Neurological:     General: No focal deficit present.     Mental Status: She is alert.  Psychiatric:  Mood and Affect: Mood normal.     ED Results / Procedures / Treatments   EKG EKG Interpretation Date/Time:  Wednesday October 09 2023 19:33:26 EDT Ventricular Rate:  68 PR Interval:  147 QRS Duration:  90 QT Interval:  393 QTC Calculation: 418 R Axis:   62  Text Interpretation: Sinus rhythm Nonspecific T wave abnormality No significant change since last tracing Confirmed by Susy Frizzle [phone]) on 10/09/2023 8:08:51 PM  Procedures Procedures  Medications Ordered in the ED Medications  ketorolac (TORADOL) 30 MG/ML injection 30 mg (30 mg Intramuscular Given 10/09/23 2051)  potassium chloride SA (KLOR-CON M) CR tablet 40 mEq (40 mEq Oral Given 10/09/23 2048)    Initial Impression and Plan  Patient here with reproducible L chest and neck  pain. Suspect costochondritis and/or carotidynia. Labs ordered in triage show normal CBC, BMP with borderline low K, Trop is normal. Will give a dose of toradol, replete K, awaiting CXR.  ED Course   Clinical Course as of 10/09/23 2213  Wed Oct 09, 2023  2211 I personally viewed the images from radiology studies and agree with radiologist interpretation: CXR is clear. Patient reports some improvement after meds. No acute or life threatening etiology of her pain identified. Plan Rx for Naprosyn, PCP follow up, RTED for any other concerns.   [CS]    Clinical Course User Index [CS] Pollyann Savoy, MD     MDM Rules/Calculators/A&P Medical Decision Making Problems Addressed: Chest wall pain: acute illness or injury Hypokalemia: acute illness or injury Neck pain: acute illness or injury  Amount and/or Complexity of Data Reviewed Labs: ordered. Decision-making details documented in ED Course. Radiology: ordered and independent interpretation performed. Decision-making details documented in ED Course. ECG/medicine tests: ordered and independent interpretation performed. Decision-making details documented in ED Course.  Risk Prescription drug management.     Final Clinical Impression(s) / ED Diagnoses Final diagnoses:  Chest wall pain  Neck pain  Hypokalemia    Rx / DC Orders ED Discharge Orders          Ordered    naproxen (NAPROSYN) 500 MG tablet  2 times daily        10/09/23 2213             Pollyann Savoy, MD 10/09/23 2213

## 2023-10-09 NOTE — ED Triage Notes (Signed)
Patient states this morning she woke up with chest pain and neck pain. Chest pain is on the left side and is rated 8/10, dull. Pain in the neck feels swollen and rates pain 7/10. Denies taking pain medication. No blood thinners.

## 2023-10-17 ENCOUNTER — Ambulatory Visit (INDEPENDENT_AMBULATORY_CARE_PROVIDER_SITE_OTHER): Payer: BC Managed Care – PPO | Admitting: Student

## 2023-10-17 ENCOUNTER — Encounter: Payer: Self-pay | Admitting: Student

## 2023-10-17 VITALS — BP 128/86 | HR 69 | Ht 68.0 in | Wt 198.8 lb

## 2023-10-17 DIAGNOSIS — E785 Hyperlipidemia, unspecified: Secondary | ICD-10-CM | POA: Diagnosis not present

## 2023-10-17 DIAGNOSIS — E876 Hypokalemia: Secondary | ICD-10-CM | POA: Insufficient documentation

## 2023-10-17 DIAGNOSIS — R7303 Prediabetes: Secondary | ICD-10-CM | POA: Insufficient documentation

## 2023-10-17 LAB — POCT GLYCOSYLATED HEMOGLOBIN (HGB A1C): HbA1c, POC (prediabetic range): 5.7 % (ref 5.7–6.4)

## 2023-10-17 NOTE — Progress Notes (Signed)
    SUBJECTIVE:   CHIEF COMPLAINT / HPI:   Katherine Lindsey is a 37 y.o. female who presents today for discussion about lab work.  She was hoping to have her cholesterol level checked and her Ha1c today. Was also wanting to get her liver levels checked as she was told by a prior doctor that this was good to do at her age.  Reports occasional indigestion sensation in her chest and up in her throat, and occasionally chest discomfort. She tried Protonix for this previously but reports she had an endoscopy which did not show anything significant and so was taken off this medication.  PERTINENT  PMH / PSH: Anxiety, atypical chest pain, hidradenitis suppurativa, Crohn's disease (remission since 2004), GERD, HPV infection   OBJECTIVE:  BP 128/86   Pulse 69   Ht 5\' 8"  (1.727 m)   Wt 198 lb 12.8 oz (90.2 kg)   SpO2 100%   BMI 30.23 kg/m   General: Pt is seated in exam chair, no acute distress. Cardiovascular: RRR, no murmurs, rubs, gallops. Pulmonary: Normal work of breathing. Lungs clear to auscultation bilaterally. Neuro/Psych: Alert and oriented to person, place, event, time. Normal affect.  ASSESSMENT/PLAN:   Assessment & Plan Prediabetes A1c today of 5.7%, improved from prior reading of 6.3% in 02/2023. Congratulated patient and encouraged her to keep up with healthy diet and increase activity level. No indication for medication at this time. Will arrange for a separate office visit to discuss nutrition and exercise. - Upcoming office visit to discuss diet and exercise. Hyperlipidemia, unspecified hyperlipidemia type Pt with LDL of 143 in 02/2023. Unfortunatly, lab was closed by the time pt was seen for her appointment, so will schedule a separate lab visit to get fasting lipid levels. No indication for medication at this time. Pt reports she is on Depo birth control if statin is considered. Will also arrange for a separate office visit to discuss nutrition and exercise. - Fasting  lipid levels ordered - Pt will have upcoming appointment to discuss diet and exercise Hypokalemia Pt with recent low K of 3.2 on 10/09/23. Will arrange for repeat testing to reevaluate; will order CMP at pt's request to also evaluate liver levels. - CMP to evaluate K and liver levels at pt's request  Governor Rooks, Medical Student [facility]  I was personally present and performed or re-performed the history, physical exam and medical decision making activities of this service and have verified that the service and findings are accurately documented in the student's note.  Shelby Mattocks, DO                  10/18/2023, 1:09 PM

## 2023-10-17 NOTE — Patient Instructions (Addendum)
Katherine Lindsey,  It was lovely seeing you in clinic today! You came in to discuss getting labs done. I'm so sorry that the lab was closed by the time you were seen, but we were able to take a HA1c (which checks your sugar level) which was 5.7% and improved from your last reading!  There are a few things for you to do after today's visit: Schedule a lab-only appointment to get your cholesterol level checked and evaluate your liver function and potassium level; try to do this early in the morning so that you can be fasting (not having eaten anything since midnight the night before) Schedule an office visit about nutrition and exercise Try taking some over the counter Tums or Pepcid for your indigestion symptoms  Thank you for allowing [place] to be a part of your care team! Governor Rooks, medical student Dr. Scarlette Shorts

## 2023-10-17 NOTE — Assessment & Plan Note (Addendum)
Pt with LDL of 143 in 02/2023. Unfortunatly, lab was closed by the time pt was seen for her appointment, so will schedule a separate lab visit to get fasting lipid levels. No indication for medication at this time. Pt reports she is on Depo birth control if statin is considered. Will also arrange for a separate office visit to discuss nutrition and exercise. - Fasting lipid levels ordered - Pt will have upcoming appointment to discuss diet and exercise

## 2023-10-17 NOTE — Progress Notes (Deleted)
  SUBJECTIVE:   CHIEF COMPLAINT / HPI:   ***  PERTINENT  PMH / PSH: Anxiety, atypical chest pain, hidradenitis suppurativa, Crohn's disease (remission since 2004), GERD, HPV infection  OBJECTIVE:  There were no vitals taken for this visit. Physical Exam   ASSESSMENT/PLAN:   Assessment & Plan Prediabetes  Hyperlipidemia, unspecified hyperlipidemia type  Hypokalemia  No follow-ups on file. Shelby Mattocks, DO 10/17/2023, 12:57 PM PGY-3, [facility] {    This will disappear when note is signed, click to select method of visit    :1}

## 2023-10-17 NOTE — Assessment & Plan Note (Addendum)
A1c today of 5.7%, improved from prior reading of 6.3% in 02/2023. Congratulated patient and encouraged her to keep up with healthy diet and increase activity level. No indication for medication at this time. Will arrange for a separate office visit to discuss nutrition and exercise. - Upcoming office visit to discuss diet and exercise.

## 2023-10-17 NOTE — Assessment & Plan Note (Addendum)
Pt with recent low K of 3.2 on 10/09/23. Will arrange for repeat testing to reevaluate; will order CMP at pt's request to also evaluate liver levels. - CMP to evaluate K and liver levels at pt's request

## 2023-10-22 ENCOUNTER — Encounter: Payer: Self-pay | Admitting: Student

## 2023-10-22 ENCOUNTER — Other Ambulatory Visit: Payer: Self-pay

## 2023-11-08 NOTE — Progress Notes (Unsigned)
NEUROLOGY FOLLOW UP OFFICE NOTE  Katherine Lindsey [id number]  Subjective:  Katherine Lindsey is a 37 y.o. year old right-handed female with a medical history of Crohn's disease, anxiety who we last saw on 05/22/23 for left sided numbness, migraines, B1 deficiency.  To briefly review: 03/20/23: Patient has had symptoms maybe for 3 years. She describes a dull pain and numbness on the left side of her face. She also has the dull pain, numbness, and tingling in her left arm and leg. She also thinks it is affecting her vision. It is more a pressure toward her eye, but no changes to vision. She has been to the ED or doctor multiple times. She relates she has had head imaging that did not show anything.    Her symptoms comes and goes. The symptoms can last a couple of days. She has the symptoms a couple of times per week. Light and sound seem to make her symptoms worse (more pressure around the eye). She denies nausea or vomiting. She denies prior history of headaches/migraines. She was given sumatriptan (?in 2021) that she took for a month that did not help per patient. She did not remember this until I brought it up on my chart review.   She has had cardiac work up as well that was normal. She has been told her symptoms were related to anxiety. She felt her anxiety was different though. She was given anxiety medication without relief of symptoms. Her symptoms worry her as she is not sure what is causing it. She has difficulty at work due to symptoms.   She denies symptoms on the right side of the body. She does have some swelling in her hands at times and has occasional joint pain.   She mentions that she occasionally drinks and will not notice the symptoms at that time. She drinks a couple of glasses of wine a couple of times per week.    She does not report any constitutional symptoms like fever, night sweats, anorexia or unintentional weight loss.   Restrictive diet? Trying to eat  healthy Family history of neuropathy/myopathy/neurologic disease? No, including migraines  05/22/23: B1 was low. I recommended supplementation with B1 100 mg daily.   Patient called on 03/26/23 mentioning pressure behind right eye pressure/pain with numbness of right side (previously on left). I recommended she treat with NSAIDs as she would a HA. She had just begun nortriptyline and B1 supplementation. Patient has been taking B1. Patient started taking nortriptyline around the beginning of 04/2023. She is having infrequent headaches, better than previous. She is having no side effects from the medications.   Patient mentions she used to drink EtOH, but not frequently (2 glasses of wine a week currently).   Patient went to ED on 04/03/23 with chest pain, felt to be most consistent with MSK etiology. Patient also went to ED on 04/25/23 for weakness. She had MRI brain which was normal.   Overall, patient feels like she is doing much better. She has less numbness on the left side.    She mentions her blood pressure has been high lately, as it is in clinic today. It is SBP 140s usually. She has discussed with PCP.  Most recent Assessment and Plan (05/22/23): This is Katherine Lindsey, a 37 y.o. female with left sided numbness with a prior history of migraines. Her B1 level was low, now on supplementation. She has had MRI brain x2 that was normal. She feels improvement since starting  B1 and nortriptlyine, including with less headaches and numbness. The etiology of patient's symptoms is unclear. I would not expect thiamine (B1) deficiency to affect left side more than right. Symptoms involving the face should localize to the brain, but this has been normal x2. Migraines are possible, though symptoms were not clearly associated with migraine or headache. A functional neurologic deficit is also possible. Fortunately, patient has improved, perhaps with B1 and nortriptyline, so would continue and monitor symptoms.    Plan: -Continue nortriptyline 20 mg qhs -Continue B1 100 mg daily -Blood work: B1 -Follow up with PCP about BP  Since their last visit: ***  MEDICATIONS:  Outpatient Encounter Medications as of 11/21/2023  Medication Sig   medroxyPROGESTERone (DEPO-PROVERA) 150 MG/ML injection Inject 150 mg into the muscle every 3 (three) months.   thiamine (VITAMIN B1) 100 MG tablet Take 100 mg by mouth daily.   No facility-administered encounter medications on file as of 11/21/2023.    PAST MEDICAL HISTORY: Past Medical History:  Diagnosis Date   Anxiety    Chest pain    Crohn's disease in remission [facility])    Hasn't had symptoms since 2004   Gallstones    Hammertoe 09/21/2014   History of low transverse cesarean section 11/28/2015   Vaginal spotting 10/17/2012   VBAC, delivered 03/19/2016    PAST SURGICAL HISTORY: Past Surgical History:  Procedure Laterality Date   CESAREAN SECTION N/A 06/16/2013   Procedure: CESAREAN SECTION;  Surgeon: Reva Bores, MD;  Location: [facility];  Service: Obstetrics;  Laterality: N/A;   CHOLECYSTECTOMY  08/28/2012   Procedure: LAPAROSCOPIC CHOLECYSTECTOMY WITH INTRAOPERATIVE CHOLANGIOGRAM;  Surgeon: [name], DO;  Location: [facility] OR;  Service: General;  Laterality: N/A;   INDUCED ABORTION  02/2012    ALLERGIES: No Known Allergies  FAMILY HISTORY: Family History  Problem Relation Age of Onset   Other Neg Hx    Diabetes Neg Hx    Heart disease Neg Hx    Hyperlipidemia Neg Hx    Hypertension Neg Hx    Stroke Neg Hx    Colon cancer Neg Hx    Esophageal cancer Neg Hx    Pancreatic cancer Neg Hx    Stomach cancer Neg Hx     SOCIAL HISTORY: Social History   Tobacco Use   Smoking status: Never   Smokeless tobacco: Never  Vaping Use   Vaping status: Never Used  Substance Use Topics   Alcohol use: Yes    Comment: occas   Drug use: Never   Social History   Social History Narrative   Are you right handed or left handed? right   Are you  currently employed ? 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 no   Who lives with you? kids   What type of home do you live in: 1 story or 2 story? two    Caffeine none      Objective:  Vital Signs:  There were no vitals taken for this visit.  ***  Labs and Imaging review: New results: HbA1c (10/17/23): 5.7  10/09/23: CBC unremarkable BMP significant for K 3.2  Previously reviewed results: 03/20/23: ANA negative ENA negative B1: < 6   B12 (04/25/23): 384  HbA1c (02/19/23): 6.3 CBC (10/01/22): wnl BMP (10/01/22): unremarkable Folate (05/05/21): wnl B12 (05/05/21): 489 TSH (01/06/21): 0.896   MRI brain wo contrast (04/25/23): FINDINGS: Brain: No acute infarct or hemorrhage. No mass or midline shift.  No hydrocephalus or extra-axial collection. No abnormal susceptibility.   Vascular: Normal flow voids.   Skull and upper cervical spine: Normal marrow signal.   Sinuses/Orbits: Unremarkable.   Other: None.   IMPRESSION: Normal brain MRI.   CT head wo contrast (04/03/23): FINDINGS: Brain: No evidence of acute infarction, hemorrhage, hydrocephalus, extra-axial collection or mass lesion/mass effect.   Vascular: No hyperdense vessel or unexpected calcification.   Skull: Normal. Negative for fracture or focal lesion.   Sinuses/Orbits: No middle ear or mastoid effusion. Paranasal sinuses are clear. Orbits are unremarkable. 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   Other: None.   IMPRESSION: No acute intracranial abnormality.   MRI brain, MRV head (06/30/21): FINDINGS: MRI HEAD WITHOUT CONTRAST   Brain: No acute infarction, hemorrhage, hydrocephalus, extra-axial collection or mass lesion. 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 Mildly prominent pituitary gland with upwardly convex margin, favored within normal limits given patient  age/sex.   Vascular: Major arterial flow voids are maintained at the skull base.   Skull and upper cervical spine: Mildly heterogeneous marrow signal without focal marrow replacing lesion.   Sinuses/Orbits: Clear sinuses.  No acute orbital findings.   Other: No sizable mastoid effusions.   MR VENOGRAM WITHOUT AND WITH CONTRAST   No evidence of dural venous sinus thrombosis. Incidental developmental venous anomaly in the high left frontal lobe.   IMPRESSION: 1. Normal brain MRI.  No acute infarct. 2. No evidence of dural venous sinus thrombosis.  Assessment/Plan:  This is Katherine Lindsey, a 37 y.o. female with: ***   Plan: ***  Return to clinic in ***  Total time spent reviewing records, interview, history/exam, documentation, and coordination of care on day of encounter:  *** min  Jacquelyne Balint, MD

## 2023-11-20 NOTE — Progress Notes (Unsigned)
NEUROLOGY FOLLOW UP OFFICE NOTE  [name] [id number]  Subjective:  Katherine Lindsey is a 37 y.o. year old right-handed female with a medical history of Crohn's disease, anxiety who we last saw on 05/22/23 for left sided numbness, migraines, B1 deficiency.  To briefly review: 03/20/23: Patient has had symptoms maybe for 3 years. She describes a dull pain and numbness on the left side of her face. She also has the dull pain, numbness, and tingling in her left arm and leg. She also thinks it is affecting her vision. It is more a pressure toward her eye, but no changes to vision. She has been to the ED or doctor multiple times. She relates she has had head imaging that did not show anything.    Her symptoms comes and goes. The symptoms can last a couple of days. She has the symptoms a couple of times per week. Light and sound seem to make her symptoms worse (more pressure around the eye). She denies nausea or vomiting. She denies prior history of headaches/migraines. She was given sumatriptan (?in 2021) that she took for a month that did not help per patient. She did not remember this until I brought it up on my chart review.   She has had cardiac work up as well that was normal. She has been told her symptoms were related to anxiety. She felt her anxiety was different though. She was given anxiety medication without relief of symptoms. Her symptoms worry her as she is not sure what is causing it. She has difficulty at work due to symptoms.   She denies symptoms on the right side of the body. She does have some swelling in her hands at times and has occasional joint pain.   She mentions that she occasionally drinks and will not notice the symptoms at that time. She drinks a couple of glasses of wine a couple of times per week.    She does not report any constitutional symptoms like fever, night sweats, anorexia or unintentional weight loss.   Restrictive diet? Trying to eat  healthy Family history of neuropathy/myopathy/neurologic disease? No, including migraines  05/22/23: B1 was low. I recommended supplementation with B1 100 mg daily.   Patient called on 03/26/23 mentioning pressure behind right eye pressure/pain with numbness of right side (previously on left). I recommended she treat with NSAIDs as she would a HA. She had just begun nortriptyline and B1 supplementation. Patient has been taking B1. Patient started taking nortriptyline around the beginning of 04/2023. She is having infrequent headaches, better than previous. She is having no side effects from the medications.   Patient mentions she used to drink EtOH, but not frequently (2 glasses of wine a week currently).   Patient went to ED on 04/03/23 with chest pain, felt to be most consistent with MSK etiology. Patient also went to ED on 04/25/23 for weakness. She had MRI brain which was normal.   Overall, patient feels like she is doing much better. She has less numbness on the left side.    She mentions her blood pressure has been high lately, as it is in clinic today. It is SBP 140s usually. She has discussed with PCP.  Most recent Assessment and Plan (05/22/23): This is Katherine Lindsey, a 37 y.o. female with left sided numbness with a prior history of migraines. Her B1 level was low, now on supplementation. She has had MRI brain x2 that was normal. She feels improvement since starting  B1 and nortriptlyine, including with less headaches and numbness. The etiology of patient's symptoms is unclear. I would not expect thiamine (B1) deficiency to affect left side more than right. Symptoms involving the face should localize to the brain, but this has been normal x2. Migraines are possible, though symptoms were not clearly associated with migraine or headache. A functional neurologic deficit is also possible. Fortunately, patient has improved, perhaps with B1 and nortriptyline, so would continue and monitor symptoms.    Plan: -Continue nortriptyline 20 mg qhs -Continue B1 100 mg daily -Blood work: B1 -Follow up with PCP about BP  Since their last visit: ***  MEDICATIONS:  Outpatient Encounter Medications as of 11/29/2023  Medication Sig   medroxyPROGESTERone (DEPO-PROVERA) 150 MG/ML injection Inject 150 mg into the muscle every 3 (three) months.   thiamine (VITAMIN B1) 100 MG tablet Take 100 mg by mouth daily.   No facility-administered encounter medications on file as of 11/29/2023.    PAST MEDICAL HISTORY: Past Medical History:  Diagnosis Date   Anxiety    Chest pain    Crohn's disease in remission [facility])    Hasn't had symptoms since 2004   Gallstones    Hammertoe 09/21/2014   History of low transverse cesarean section 11/28/2015   Vaginal spotting 10/17/2012   VBAC, delivered 03/19/2016    PAST SURGICAL HISTORY: Past Surgical History:  Procedure Laterality Date   CESAREAN SECTION N/A 06/16/2013   Procedure: CESAREAN SECTION;  Surgeon: Reva Bores, MD;  Location: [facility];  Service: Obstetrics;  Laterality: N/A;   CHOLECYSTECTOMY  08/28/2012   Procedure: LAPAROSCOPIC CHOLECYSTECTOMY WITH INTRAOPERATIVE CHOLANGIOGRAM;  Surgeon: [name], DO;  Location: [facility] OR;  Service: General;  Laterality: N/A;   INDUCED ABORTION  02/2012    ALLERGIES: No Known Allergies  FAMILY HISTORY: Family History  Problem Relation Age of Onset   Other Neg Hx    Diabetes Neg Hx    Heart disease Neg Hx    Hyperlipidemia Neg Hx    Hypertension Neg Hx    Stroke Neg Hx    Colon cancer Neg Hx    Esophageal cancer Neg Hx    Pancreatic cancer Neg Hx    Stomach cancer Neg Hx     SOCIAL HISTORY: Social History   Tobacco Use   Smoking status: Never   Smokeless tobacco: Never  Vaping Use   Vaping status: Never Used  Substance Use Topics   Alcohol use: Yes    Comment: occas   Drug use: Never   Social History   Social History Narrative   Are you right handed or left handed? right   Are you  currently employed ? 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 no   Who lives with you? kids   What type of home do you live in: 1 story or 2 story? two    Caffeine none      Objective:  Vital Signs:  There were no vitals taken for this visit.  ***  Labs and Imaging review: New results: HbA1c (10/17/23): 5.7  10/09/23: CBC unremarkable BMP significant for K 3.2  Previously reviewed results: 03/20/23: ANA negative ENA negative B1: < 6   B12 (04/25/23): 384  HbA1c (02/19/23): 6.3 CBC (10/01/22): wnl BMP (10/01/22): unremarkable Folate (05/05/21): wnl B12 (05/05/21): 489 TSH (01/06/21): 0.896   MRI brain wo contrast (04/25/23): FINDINGS: Brain: No acute infarct or hemorrhage. No mass or midline shift.  No hydrocephalus or extra-axial collection. No abnormal susceptibility.   Vascular: Normal flow voids.   Skull and upper cervical spine: Normal marrow signal.   Sinuses/Orbits: Unremarkable.   Other: None.   IMPRESSION: Normal brain MRI.   CT head wo contrast (04/03/23): FINDINGS: Brain: No evidence of acute infarction, hemorrhage, hydrocephalus, extra-axial collection or mass lesion/mass effect.   Vascular: No hyperdense vessel or unexpected calcification.   Skull: Normal. Negative for fracture or focal lesion.   Sinuses/Orbits: No middle ear or mastoid effusion. Paranasal sinuses are clear. Orbits are unremarkable. 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   Other: None.   IMPRESSION: No acute intracranial abnormality.   MRI brain, MRV head (06/30/21): FINDINGS: MRI HEAD WITHOUT CONTRAST   Brain: No acute infarction, hemorrhage, hydrocephalus, extra-axial collection or mass lesion. 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 Mildly prominent pituitary gland with upwardly convex margin, favored within normal limits given patient  age/sex.   Vascular: Major arterial flow voids are maintained at the skull base.   Skull and upper cervical spine: Mildly heterogeneous marrow signal without focal marrow replacing lesion.   Sinuses/Orbits: Clear sinuses.  No acute orbital findings.   Other: No sizable mastoid effusions.   MR VENOGRAM WITHOUT AND WITH CONTRAST   No evidence of dural venous sinus thrombosis. Incidental developmental venous anomaly in the high left frontal lobe.   IMPRESSION: 1. Normal brain MRI.  No acute infarct. 2. No evidence of dural venous sinus thrombosis.  Assessment/Plan:  This is Debbe Bales, a 37 y.o. female with: ***   Plan: ***  Return to clinic in ***  Total time spent reviewing records, interview, history/exam, documentation, and coordination of care on day of encounter:  *** min  Jacquelyne Balint, MD

## 2023-11-21 ENCOUNTER — Ambulatory Visit: Payer: BC Managed Care – PPO | Admitting: Neurology

## 2023-11-21 ENCOUNTER — Ambulatory Visit: Payer: BC Managed Care – PPO

## 2023-11-22 ENCOUNTER — Ambulatory Visit: Payer: BC Managed Care – PPO

## 2023-11-29 ENCOUNTER — Ambulatory Visit: Payer: BC Managed Care – PPO | Admitting: Neurology

## 2023-12-04 ENCOUNTER — Ambulatory Visit: Payer: BC Managed Care – PPO

## 2023-12-04 ENCOUNTER — Ambulatory Visit: Payer: BC Managed Care – PPO | Admitting: *Deleted

## 2023-12-04 VITALS — BP 126/80 | HR 78 | Ht 68.0 in | Wt 204.4 lb

## 2023-12-04 DIAGNOSIS — Z3042 Encounter for surveillance of injectable contraceptive: Secondary | ICD-10-CM

## 2023-12-04 MED ORDER — MEDROXYPROGESTERONE ACETATE 150 MG/ML IM SUSP
150.0000 mg | Freq: Once | INTRAMUSCULAR | Status: AC
  Administered 2023-12-04: 150 mg via INTRAMUSCULAR

## 2023-12-04 NOTE — Progress Notes (Signed)
Depo provera 150 mg IM administered as scheduled. Injection tolerated well. Next dose due 3/5-3/19/25.  Pt informed she was due for Annual Gyn exam 10/12/23 - will schedule upon check-out today.

## 2023-12-23 NOTE — Progress Notes (Deleted)
NEUROLOGY FOLLOW UP OFFICE NOTE  Katherine Lindsey [id number]  Subjective:  Katherine Lindsey is a 38 y.o. year old right-handed female with a medical history of Crohn's disease, anxiety who we last saw on 05/22/23 for left sided numbness, migraines, B1 deficiency.  To briefly review: 03/20/23: Patient has had symptoms maybe for 3 years. She describes a dull pain and numbness on the left side of her face. She also has the dull pain, numbness, and tingling in her left arm and leg. She also thinks it is affecting her vision. It is more a pressure toward her eye, but no changes to vision. She has been to the ED or doctor multiple times. She relates she has had head imaging that did not show anything.    Her symptoms comes and goes. The symptoms can last a couple of days. She has the symptoms a couple of times per week. Light and sound seem to make her symptoms worse (more pressure around the eye). She denies nausea or vomiting. She denies prior history of headaches/migraines. She was given sumatriptan (?in 2021) that she took for a month that did not help per patient. She did not remember this until I brought it up on my chart review.   She has had cardiac work up as well that was normal. She has been told her symptoms were related to anxiety. She felt her anxiety was different though. She was given anxiety medication without relief of symptoms. Her symptoms worry her as she is not sure what is causing it. She has difficulty at work due to symptoms.   She denies symptoms on the right side of the body. She does have some swelling in her hands at times and has occasional joint pain.   She mentions that she occasionally drinks and will not notice the symptoms at that time. She drinks a couple of glasses of wine a couple of times per week.    She does not report any constitutional symptoms like fever, night sweats, anorexia or unintentional weight loss.   Restrictive diet? Trying to eat  healthy Family history of neuropathy/myopathy/neurologic disease? No, including migraines  05/22/23: B1 was low. I recommended supplementation with B1 100 mg daily.   Patient called on 03/26/23 mentioning pressure behind right eye pressure/pain with numbness of right side (previously on left). I recommended she treat with NSAIDs as she would a HA. She had just begun nortriptyline and B1 supplementation. Patient has been taking B1. Patient started taking nortriptyline around the beginning of 04/2023. She is having infrequent headaches, better than previous. She is having no side effects from the medications.   Patient mentions she used to drink EtOH, but not frequently (2 glasses of wine a week currently).   Patient went to ED on 04/03/23 with chest pain, felt to be most consistent with MSK etiology. Patient also went to ED on 04/25/23 for weakness. She had MRI brain which was normal.   Overall, patient feels like she is doing much better. She has less numbness on the left side.    She mentions her blood pressure has been high lately, as it is in clinic today. It is SBP 140s usually. She has discussed with PCP.  Most recent Assessment and Plan (05/22/23): This is Katherine Lindsey, a 38 y.o. female with left sided numbness with a prior history of migraines. Her B1 level was low, now on supplementation. She has had MRI brain x2 that was normal. She feels improvement since starting  B1 and nortriptlyine, including with less headaches and numbness. The etiology of patient's symptoms is unclear. I would not expect thiamine (B1) deficiency to affect left side more than right. Symptoms involving the face should localize to the brain, but this has been normal x2. Migraines are possible, though symptoms were not clearly associated with migraine or headache. A functional neurologic deficit is also possible. Fortunately, patient has improved, perhaps with B1 and nortriptyline, so would continue and monitor symptoms.    Plan: -Continue nortriptyline 20 mg qhs -Continue B1 100 mg daily -Blood work: B1 -Follow up with PCP about BP  Since their last visit: ***  MEDICATIONS:  Outpatient Encounter Medications as of 01/02/2024  Medication Sig   medroxyPROGESTERone (DEPO-PROVERA) 150 MG/ML injection Inject 150 mg into the muscle every 3 (three) months.   thiamine (VITAMIN B1) 100 MG tablet Take 100 mg by mouth daily.   No facility-administered encounter medications on file as of 01/02/2024.    PAST MEDICAL HISTORY: Past Medical History:  Diagnosis Date   Anxiety    Chest pain    Crohn's disease in remission [facility]    Hasn't had symptoms since 2004   Gallstones    Hammertoe 09/21/2014   History of low transverse cesarean section 11/28/2015   Vaginal spotting 10/17/2012   VBAC, delivered 03/19/2016    PAST SURGICAL HISTORY: Past Surgical History:  Procedure Laterality Date   CESAREAN SECTION N/A 06/16/2013   Procedure: CESAREAN SECTION;  Surgeon: Reva Bores, MD;  Location: [facility];  Service: Obstetrics;  Laterality: N/A;   CHOLECYSTECTOMY  08/28/2012   Procedure: LAPAROSCOPIC CHOLECYSTECTOMY WITH INTRAOPERATIVE CHOLANGIOGRAM;  Surgeon: [name], DO;  Location: [facility] OR;  Service: General;  Laterality: N/A;   INDUCED ABORTION  02/2012    ALLERGIES: No Known Allergies  FAMILY HISTORY: Family History  Problem Relation Age of Onset   Other Neg Hx    Diabetes Neg Hx    Heart disease Neg Hx    Hyperlipidemia Neg Hx    Hypertension Neg Hx    Stroke Neg Hx    Colon cancer Neg Hx    Esophageal cancer Neg Hx    Pancreatic cancer Neg Hx    Stomach cancer Neg Hx     SOCIAL HISTORY: Social History   Tobacco Use   Smoking status: Never   Smokeless tobacco: Never  Vaping Use   Vaping status: Never Used  Substance Use Topics   Alcohol use: Yes    Comment: occas   Drug use: Never   Social History   Social History Narrative   Are you right handed or left handed? right   Are you  currently employed ? 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 no   Who lives with you? kids   What type of home do you live in: 1 story or 2 story? two    Caffeine none      Objective:  Vital Signs:  There were no vitals taken for this visit.  ***  Labs and Imaging review: New results: HbA1c (10/17/23): 5.7  10/09/23: CBC unremarkable BMP significant for K 3.2  Previously reviewed results: 03/20/23: ANA negative ENA negative B1: < 6   B12 (04/25/23): 384  HbA1c (02/19/23): 6.3 CBC (10/01/22): wnl BMP (10/01/22): unremarkable Folate (05/05/21): wnl B12 (05/05/21): 489 TSH (01/06/21): 0.896   MRI brain wo contrast (04/25/23): FINDINGS: Brain: No acute infarct or hemorrhage. No mass or midline shift.  No hydrocephalus or extra-axial collection. No abnormal susceptibility.   Vascular: Normal flow voids.   Skull and upper cervical spine: Normal marrow signal.   Sinuses/Orbits: Unremarkable.   Other: None.   IMPRESSION: Normal brain MRI.   CT head wo contrast (04/03/23): FINDINGS: Brain: No evidence of acute infarction, hemorrhage, hydrocephalus, extra-axial collection or mass lesion/mass effect.   Vascular: No hyperdense vessel or unexpected calcification.   Skull: Normal. Negative for fracture or focal lesion.   Sinuses/Orbits: No middle ear or mastoid effusion. Paranasal sinuses are clear. Orbits are unremarkable. 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   Other: None.   IMPRESSION: No acute intracranial abnormality.   MRI brain, MRV head (06/30/21): FINDINGS: MRI HEAD WITHOUT CONTRAST   Brain: No acute infarction, hemorrhage, hydrocephalus, extra-axial collection or mass lesion. 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 Mildly prominent pituitary gland with upwardly convex margin, favored within normal limits given patient  age/sex.   Vascular: Major arterial flow voids are maintained at the skull base.   Skull and upper cervical spine: Mildly heterogeneous marrow signal without focal marrow replacing lesion.   Sinuses/Orbits: Clear sinuses.  No acute orbital findings.   Other: No sizable mastoid effusions.   MR VENOGRAM WITHOUT AND WITH CONTRAST   No evidence of dural venous sinus thrombosis. Incidental developmental venous anomaly in the high left frontal lobe.   IMPRESSION: 1. Normal brain MRI.  No acute infarct. 2. No evidence of dural venous sinus thrombosis.  Assessment/Plan:  This is Katherine Lindsey, a 38 y.o. female with: ***   Plan: ***  Return to clinic in ***  Total time spent reviewing records, interview, history/exam, documentation, and coordination of care on day of encounter:  *** min  Jacquelyne Balint, MD

## 2024-01-02 ENCOUNTER — Ambulatory Visit: Payer: BC Managed Care – PPO | Admitting: Neurology

## 2024-01-02 ENCOUNTER — Encounter: Payer: Self-pay | Admitting: Neurology

## 2024-01-16 ENCOUNTER — Ambulatory Visit: Payer: BC Managed Care – PPO | Admitting: Obstetrics and Gynecology

## 2024-01-16 ENCOUNTER — Encounter: Payer: Self-pay | Admitting: Obstetrics and Gynecology

## 2024-01-16 ENCOUNTER — Other Ambulatory Visit: Payer: Self-pay

## 2024-01-16 ENCOUNTER — Other Ambulatory Visit (HOSPITAL_COMMUNITY)
Admission: RE | Admit: 2024-01-16 | Discharge: 2024-01-16 | Disposition: A | Discharge status: Home / Self Care | Payer: BC Managed Care – PPO | Source: Ambulatory Visit | Attending: Obstetrics and Gynecology | Admitting: Obstetrics and Gynecology

## 2024-01-16 VITALS — BP 121/86 | HR 72 | Wt 209.5 lb

## 2024-01-16 DIAGNOSIS — Z113 Encounter for screening for infections with a predominantly sexual mode of transmission: Secondary | ICD-10-CM | POA: Diagnosis not present

## 2024-01-16 DIAGNOSIS — Z01419 Encounter for gynecological examination (general) (routine) without abnormal findings: Secondary | ICD-10-CM

## 2024-01-16 NOTE — Progress Notes (Signed)
ANNUAL EXAM Patient name: Katherine Lindsey MRN [id number]  Date of birth: 05-02-1986 Chief Complaint:   Gynecologic Exam  History of Present Illness:   Katherine Lindsey is a 38 y.o. [phone]  female being seen today for a routine annual exam.  Current complaints: no pelvic or vaginal complaints. Concerns regarding the side affects surrounding depo she has seen online.   No LMP recorded. Patient has had an injection.   The pregnancy intention screening data noted above was reviewed. Potential methods of contraception were discussed. The patient elected to proceed with Depo provera   Last pap 10/11/22. Results were: NILM w/ HRHPV negative. H/O abnormal pap: yes Last mammogram: n/a. Results were: N/A. Family h/o breast cancer: no     01/16/2024    8:53 AM 10/17/2023    4:29 PM 01/22/2023    3:29 PM 01/08/2023    2:58 PM 01/08/2023    2:15 PM  Depression screen PHQ 2/9  Decreased Interest 0 0 0 0 0  Down, Depressed, Hopeless 0 0 0 0 0  PHQ - 2 Score 0 0 0 0 0  Altered sleeping 0 0 0 0 0  Tired, decreased energy 0 0 0 0 0  Change in appetite 0 0 0 0 0  Feeling bad or failure about yourself  0 0 0 0 0  Trouble concentrating 0 0 0 0 0  Moving slowly or fidgety/restless 0 0 0 0 0  Suicidal thoughts 0 0 0 0 0  PHQ-9 Score 0 0 0 0 0  Difficult doing work/chores  Not difficult at all Not difficult at all  Not difficult at all        01/16/2024    8:53 AM 10/11/2022   10:50 AM 10/31/2021    2:38 PM 05/16/2021    9:51 AM  GAD 7 : Generalized Anxiety Score  Nervous, Anxious, on Edge 0 0 1 1  Control/stop worrying 0 0 0 0  Worry too much - different things 0 0 0 1  Trouble relaxing 0 0 0 0  Restless 0 0 0 0  Easily annoyed or irritable 0 0 0 0  Afraid - awful might happen 0 0 0 0  Total GAD 7 Score 0 0 1 2  Anxiety Difficulty  Not difficult at all       Review of Systems:   Pertinent items are noted in HPI Denies any headaches, blurred vision, fatigue, shortness of  breath, chest pain, abdominal pain, abnormal vaginal discharge/itching/odor/irritation, problems with periods, bowel movements, urination, or intercourse unless otherwise stated above. Pertinent History Reviewed:  Reviewed past medical,surgical, social and family history.  Reviewed problem list, medications and allergies. Physical Assessment:   Vitals:   01/16/24 0852  BP: 121/86  Pulse: 72  Weight: 209 lb 8 oz (95 kg)  Body mass index is 31.85 kg/m.        Physical Examination:   General appearance - well appearing, and in no distress  Mental status - alert, oriented to person, place, and time  Psych:  She has a normal mood and affect  Skin - warm and dry, normal color, no suspicious lesions noted  Chest - effort normal, all lung fields clear to auscultation bilaterally  Heart - normal rate and regular rhythm  Neck:  midline trachea, no thyromegaly or nodules  Breasts - breasts appear normal, no suspicious masses, no skin or nipple changes or  axillary nodes  Abdomen - soft  Pelvic - deferred  Extremities:  No swelling or varicosities noted  Chaperone present for exam  No results found for this or any previous visit (from the past 24 hours).  Assessment & Plan:  1. Well woman exam with routine gynecological exam (Primary) Discussed research and side effect surrounding depo provera , ok to continue if she desires but briefly discussed other options of birth control if she would like Encouraged self breast exam, mammogram @ 40  2. Screen for STD (sexually transmitted disease)  - Cervicovaginal ancillary only - HIV Antibody (routine testing w rflx) - RPR - Hepatitis B Surface AntiGEN - Hepatitis C Antibody   Labs/procedures today:   Mammogram: @ 38yo, or sooner if problems   Orders Placed This Encounter  Procedures   HIV Antibody (routine testing w rflx)   RPR   Hepatitis B Surface AntiGEN   Hepatitis C Antibody    Meds: No orders of the defined types were  placed in this encounter.   Follow-up: Return in about 1 year (around 01/15/2025) for Thayer Jew, FNP

## 2024-01-17 LAB — HEPATITIS C ANTIBODY: Hep C Virus Ab: NONREACTIVE

## 2024-01-17 LAB — CERVICOVAGINAL ANCILLARY ONLY
Bacterial Vaginitis (gardnerella): POSITIVE — AB
Candida Glabrata: NEGATIVE
Candida Vaginitis: NEGATIVE
Chlamydia: NEGATIVE
Comment: NEGATIVE
Comment: NEGATIVE
Comment: NEGATIVE
Comment: NEGATIVE
Comment: NEGATIVE
Comment: NORMAL
Neisseria Gonorrhea: NEGATIVE
Trichomonas: NEGATIVE

## 2024-01-17 LAB — HEPATITIS B SURFACE ANTIGEN: Hepatitis B Surface Ag: NEGATIVE

## 2024-01-17 LAB — HIV ANTIBODY (ROUTINE TESTING W REFLEX): HIV Screen 4th Generation wRfx: NONREACTIVE

## 2024-01-17 LAB — RPR: RPR Ser Ql: NONREACTIVE

## 2024-01-21 MED ORDER — METRONIDAZOLE 500 MG PO TABS: 500.0000 mg | ORAL_TABLET | Freq: Two times a day (BID) | ORAL | 0 refills | Status: AC

## 2024-01-21 NOTE — Addendum Note (Signed)
Addended by: Sue Lush on: 01/21/2024 12:57 PM   Modules accepted: Orders

## 2024-02-20 ENCOUNTER — Other Ambulatory Visit: Payer: Self-pay

## 2024-02-20 ENCOUNTER — Ambulatory Visit: Payer: BC Managed Care – PPO | Admitting: *Deleted

## 2024-02-20 VITALS — BP 131/83 | HR 72 | Ht 68.5 in | Wt 212.4 lb

## 2024-02-20 DIAGNOSIS — Z3042 Encounter for surveillance of injectable contraceptive: Secondary | ICD-10-CM | POA: Diagnosis not present

## 2024-02-20 MED ORDER — MEDROXYPROGESTERONE ACETATE 150 MG/ML IM SUSP
150.0000 mg | Freq: Once | INTRAMUSCULAR | Status: AC
  Administered 2024-02-20: 150 mg via INTRAMUSCULAR

## 2024-02-20 NOTE — Progress Notes (Signed)
 Depo provera 150 mg IM administered as scheduled.  Pt tolerated well. Next injection due 5/22-6/5. Next Annual Gyn exam due after 01/15/25.

## 2024-05-18 ENCOUNTER — Ambulatory Visit: Admitting: *Deleted

## 2024-05-18 ENCOUNTER — Other Ambulatory Visit: Payer: Self-pay

## 2024-05-18 VITALS — BP 124/88 | HR 69 | Ht 68.0 in | Wt 216.8 lb

## 2024-05-18 DIAGNOSIS — Z3042 Encounter for surveillance of injectable contraceptive: Secondary | ICD-10-CM

## 2024-05-18 MED ORDER — MEDROXYPROGESTERONE ACETATE 150 MG/ML IM SUSY
150.0000 mg | PREFILLED_SYRINGE | Freq: Once | INTRAMUSCULAR | Status: AC
  Administered 2024-05-18: 150 mg via INTRAMUSCULAR

## 2024-05-18 NOTE — Progress Notes (Addendum)
 Katherine Lindsey here for Depo-Provera  Injection. Injection administered without complication. Patient will return in 3 months for next injection between 08/03/24 and 08/17/24. Next annual visit due 01/16/25.  [name] 05/18/2024  9:37 AM

## 2024-05-26 ENCOUNTER — Encounter: Payer: Self-pay | Admitting: *Deleted

## 2024-06-19 ENCOUNTER — Encounter (HOSPITAL_COMMUNITY): Payer: Self-pay | Admitting: Emergency Medicine

## 2024-06-19 ENCOUNTER — Emergency Department (HOSPITAL_COMMUNITY)

## 2024-06-19 ENCOUNTER — Other Ambulatory Visit: Payer: Self-pay

## 2024-06-19 ENCOUNTER — Emergency Department (HOSPITAL_COMMUNITY)
Admission: EM | Admit: 2024-06-19 | Discharge: 2024-06-20 | Disposition: A | Discharge status: Home / Self Care | Attending: Emergency Medicine | Admitting: Emergency Medicine

## 2024-06-19 DIAGNOSIS — R0789 Other chest pain: Secondary | ICD-10-CM | POA: Insufficient documentation

## 2024-06-19 DIAGNOSIS — R1013 Epigastric pain: Secondary | ICD-10-CM | POA: Insufficient documentation

## 2024-06-19 DIAGNOSIS — R079 Chest pain, unspecified: Secondary | ICD-10-CM | POA: Diagnosis not present

## 2024-06-19 LAB — CBC
HCT: 37.3 % (ref 36.0–46.0)
Hemoglobin: 12 g/dL (ref 12.0–15.0)
MCH: 29.9 pg (ref 26.0–34.0)
MCHC: 32.2 g/dL (ref 30.0–36.0)
MCV: 92.8 fL (ref 80.0–100.0)
Platelets: 306 K/uL (ref 150–400)
RBC: 4.02 MIL/uL (ref 3.87–5.11)
RDW: 13.2 % (ref 11.5–15.5)
WBC: 8.4 K/uL (ref 4.0–10.5)
nRBC: 0 % (ref 0.0–0.2)

## 2024-06-19 LAB — COMPREHENSIVE METABOLIC PANEL WITH GFR
ALT: 26 U/L (ref 0–44)
AST: 31 U/L (ref 15–41)
Albumin: 4.3 g/dL (ref 3.5–5.0)
Alkaline Phosphatase: 71 U/L (ref 38–126)
Anion gap: 12 (ref 5–15)
BUN: 13 mg/dL (ref 6–20)
CO2: 19 mmol/L — ABNORMAL LOW (ref 22–32)
Calcium: 9.7 mg/dL (ref 8.9–10.3)
Chloride: 107 mmol/L (ref 98–111)
Creatinine, Ser: 1.01 mg/dL — ABNORMAL HIGH (ref 0.44–1.00)
GFR, Estimated: >60 mL/min (ref >60)
Glucose, Bld: 125 mg/dL — ABNORMAL HIGH (ref 70–99)
Potassium: 4.2 mmol/L (ref 3.5–5.1)
Sodium: 138 mmol/L (ref 135–145)
Total Bilirubin: 0.6 mg/dL (ref 0.0–1.2)
Total Protein: 7.6 g/dL (ref 6.5–8.1)

## 2024-06-19 LAB — URINALYSIS, ROUTINE W REFLEX MICROSCOPIC
Bilirubin Urine: NEGATIVE
Glucose, UA: NEGATIVE mg/dL
Hgb urine dipstick: NEGATIVE
Ketones, ur: NEGATIVE mg/dL
Nitrite: NEGATIVE
Protein, ur: NEGATIVE mg/dL
Specific Gravity, Urine: 1.019 (ref 1.005–1.030)
pH: 6 (ref 5.0–8.0)

## 2024-06-19 LAB — LIPASE, BLOOD: Lipase: 30 U/L (ref 11–51)

## 2024-06-19 LAB — TROPONIN I (HIGH SENSITIVITY): Troponin I (High Sensitivity): <2 ng/L (ref <18)

## 2024-06-19 LAB — D-DIMER, QUANTITATIVE: D-Dimer, Quant: 0.44 ug{FEU}/mL (ref 0.00–0.50)

## 2024-06-19 MED ORDER — FAMOTIDINE 20 MG PO TABS
20.0000 mg | ORAL_TABLET | Freq: Once | ORAL | Status: AC
  Administered 2024-06-19: 20 mg via ORAL
  Filled 2024-06-19: qty 1

## 2024-06-19 MED ORDER — IBUPROFEN 200 MG PO TABS
400.0000 mg | ORAL_TABLET | Freq: Once | ORAL | Status: AC
  Administered 2024-06-19: 400 mg via ORAL
  Filled 2024-06-19: qty 2

## 2024-06-19 NOTE — ED Provider Notes (Signed)
 [place] EMERGENCY DEPARTMENT AT [facility] Provider Note   CSN: [id number] Arrival date & time: 06/19/24  2118     Patient presents with: Chest Pain and Abdominal Pain   Katherine Lindsey is a 38 y.o. female.   38 year old female presenting with chest pain.  Patient reports waking up from a nap around 8 PM and describes what initially began as sharp left-sided chest pain with radiation under her left breast/epigastric region and weird sensation to her left neck, she now describes the pain as dull.  Pain is not pleuritic in nature, she denies shortness of breath, abdominal pain, nausea/vomiting/diarrhea, fever, cough/congestion, difficulty/painful swallowing.  She is on Depo-Provera  for birth control, denies any other daily medications.   Chest Pain Associated symptoms: abdominal pain   Abdominal Pain Associated symptoms: chest pain        Prior to Admission medications   Medication Sig Start Date End Date Taking? Authorizing Provider  medroxyPROGESTERone  (DEPO-PROVERA ) 150 MG/ML injection Inject 150 mg into the muscle every 3 (three) months.    [provider]  thiamine  (VITAMIN B1) 100 MG tablet Take 100 mg by mouth daily.    [provider]    Allergies: Patient has no known allergies.    Review of Systems  Cardiovascular:  Positive for chest pain.    Updated Vital Signs  Vitals:   06/19/24 2126 06/19/24 2128  BP: (!) 146/94   Pulse: 66   Resp: 20   Temp: 98.2 F (36.8 C)   TempSrc: Oral   SpO2: 100%   Weight:  98.3 kg     Physical Exam Vitals and nursing note reviewed.  HENT:     Head: Normocephalic.  Neck:     Vascular: No carotid bruit.  Cardiovascular:     Rate and Rhythm: Normal rate and regular rhythm.     Pulses:          Radial pulses are 2+ on the right side and 2+ on the left side.     Heart sounds: Normal heart sounds.     Comments: Reproducible left sided chest TTP Pulmonary:     Effort: Pulmonary  effort is normal.     Breath sounds: Normal breath sounds.  Abdominal:     Palpations: Abdomen is soft.     Tenderness: There is no abdominal tenderness. There is no guarding.  Musculoskeletal:     Cervical back: Normal range of motion and neck supple. No rigidity or tenderness.     Right lower leg: No edema.     Left lower leg: No edema.     Comments: Moves all extremities spontaneously without difficulty Grip strength equal and in-tact bilaterally  Skin:    General: Skin is warm and dry.  Neurological:     Mental Status: She is alert and oriented to person, place, and time.     Sensory: No sensory deficit.     Motor: No weakness.     Comments: Facial expressions are intact and symmetric without evidence of facial droop     (all labs ordered are listed, but only abnormal results are displayed) Labs Reviewed  COMPREHENSIVE METABOLIC PANEL WITH GFR - Abnormal; Notable for the following components:      Result Value   CO2 19 (*)    Glucose, Bld 125 (*)    Creatinine, Ser 1.01 (*)    All other components within normal limits  URINALYSIS, ROUTINE W REFLEX MICROSCOPIC - Abnormal; Notable for the following components:  APPearance HAZY (*)    Leukocytes,Ua TRACE (*)    Bacteria, UA RARE (*)    All other components within normal limits  URINE CULTURE  LIPASE, BLOOD  CBC  D-DIMER, QUANTITATIVE  HCG, SERUM, QUALITATIVE  TROPONIN I (HIGH SENSITIVITY)    EKG: None  Radiology: DG Chest Portable 1 View Result Date: 06/19/2024 CLINICAL DATA:  Chest pain, epigastric pain EXAM: PORTABLE CHEST 1 VIEW COMPARISON:  10/09/2023 FINDINGS: The heart size and mediastinal contours are within normal limits. Both lungs are clear. The visualized skeletal structures are unremarkable. IMPRESSION: Normal study. Electronically Signed   By: Franky Crease M.D.   On: 06/19/2024 23:11     Procedures   Medications Ordered in the ED  famotidine  (PEPCID ) tablet 20 mg (20 mg Oral Given 06/19/24 2338)   ibuprofen  (ADVIL ) tablet 400 mg (400 mg Oral Given 06/19/24 2338)                                    Medical Decision Making This patient presents to the ED for concern of chest pain, this involves an extensive number of treatment options, and is a complaint that carries with it a high risk of complications and morbidity.  The differential diagnosis includes ACS, GERD, musculoskeletal pain/strain/sprain, costochondritis, PE.   Co morbidities that complicate the patient evaluation  Obesity, hyperlipidemia   Additional history obtained:  Additional history obtained from record review External records from outside source obtained and reviewed including prior emergency department notes   Lab Tests:  I Ordered, and personally interpreted labs.  The pertinent results include: CBC within normal limits.  CMP notable for mild increase in creatinine as compared to baseline, 1.01 today with most recent value 8 months ago of 0.76, however this is only slightly above normal range and is likely not representative of a true acute kidney injury.  Initial troponin less than 2.  Lipase within normal limits.  D-dimer within normal limits.  Urinalysis notable for trace leukocytes with rare bacteria, patient not experiencing dysuria, unlikely representative of true urinary tract infection given presence of squamous epithelial cells, however will send for culture.   Imaging Studies ordered:  I ordered imaging studies including CXR  I independently visualized and interpreted imaging which showed Normal study.  I agree with the radiologist interpretation   Cardiac Monitoring: / EKG:  The patient was maintained on a cardiac monitor.  I personally viewed and interpreted the cardiac monitored which showed an underlying rhythm of: NSR    Problem List / ED Course / Critical interventions / Medication management  I ordered medication including Pepcid  for suspected GERD, ibuprofen  for chest wall  pain Reevaluation of the patient after these medicines showed that the patient improved I have reviewed the patients home medicines and have made adjustments as needed    Test / Admission - Considered:  Physical exam was largely unremarkable, patient does demonstrate some left-sided chest wall tenderness to palpation that is reproducible.  Workup is reassuring as above.  At time of my reassessment, patient mentions that she occasionally burps and feels that the pain lessens, I suspect there is likely a GERD component to her complaint as well as musculoskeletal pain given reproducible nature of chest wall tenderness.  I did order Pepcid /ibuprofen  as above.  I encouraged the patient to follow-up with her primary care provider if her symptoms persist, she voiced understanding and is in agreement with this  plan.  Return precautions discussed, she is approved for discharge at this time.  Amount and/or Complexity of Data Reviewed Labs: ordered. Radiology: ordered.  Risk OTC drugs.        Final diagnoses:  Chest wall pain    ED Discharge Orders     None          Mikiya Nebergall N, PA-C 06/19/24 2351    Patsey Lot, MD 06/20/24 [phone]

## 2024-06-19 NOTE — ED Triage Notes (Addendum)
 Pt in with L upper chest/epigastric pain onset this afternoon. Pain described as sharp, reporting numbness to L jaw as well. Denies any n/v/d

## 2024-06-19 NOTE — Discharge Instructions (Signed)
 I have provided you with the contact information for  [facility], please schedule a follow-up with them if you are not currently established with a primary care provider.  Return to the emergency department if your symptoms worsen.

## 2024-06-21 LAB — URINE CULTURE

## 2024-06-24 ENCOUNTER — Telehealth (HOSPITAL_COMMUNITY): Payer: Self-pay

## 2024-06-24 NOTE — Telephone Encounter (Signed)
 Pt. Called an requested a call back to discuss recent ED visit.  All questions answered and pt. Will follow-up with PCP for questions concerning labs.

## 2024-06-29 ENCOUNTER — Emergency Department (HOSPITAL_COMMUNITY)

## 2024-06-29 ENCOUNTER — Encounter (HOSPITAL_COMMUNITY): Payer: Self-pay | Admitting: *Deleted

## 2024-06-29 ENCOUNTER — Other Ambulatory Visit: Payer: Self-pay

## 2024-06-29 ENCOUNTER — Emergency Department (HOSPITAL_COMMUNITY)
Admission: EM | Admit: 2024-06-29 | Discharge: 2024-06-29 | Disposition: A | Discharge status: Home / Self Care | Attending: Emergency Medicine | Admitting: Emergency Medicine

## 2024-06-29 DIAGNOSIS — R0789 Other chest pain: Secondary | ICD-10-CM | POA: Diagnosis not present

## 2024-06-29 DIAGNOSIS — R079 Chest pain, unspecified: Secondary | ICD-10-CM | POA: Diagnosis not present

## 2024-06-29 LAB — CBC WITH DIFFERENTIAL/PLATELET
Abs Immature Granulocytes: 0.02 K/uL (ref 0.00–0.07)
Basophils Absolute: 0 K/uL (ref 0.0–0.1)
Basophils Relative: 0 %
Eosinophils Absolute: 0.1 K/uL (ref 0.0–0.5)
Eosinophils Relative: 1 %
HCT: 36.6 % (ref 36.0–46.0)
Hemoglobin: 12.1 g/dL (ref 12.0–15.0)
Immature Granulocytes: 0 %
Lymphocytes Relative: 33 %
Lymphs Abs: 2.5 K/uL (ref 0.7–4.0)
MCH: 29.7 pg (ref 26.0–34.0)
MCHC: 33.1 g/dL (ref 30.0–36.0)
MCV: 89.9 fL (ref 80.0–100.0)
Monocytes Absolute: 0.6 K/uL (ref 0.1–1.0)
Monocytes Relative: 8 %
Neutro Abs: 4.4 K/uL (ref 1.7–7.7)
Neutrophils Relative %: 58 %
Platelets: 323 K/uL (ref 150–400)
RBC: 4.07 MIL/uL (ref 3.87–5.11)
RDW: 12.9 % (ref 11.5–15.5)
WBC: 7.5 K/uL (ref 4.0–10.5)
nRBC: 0 % (ref 0.0–0.2)

## 2024-06-29 LAB — BASIC METABOLIC PANEL WITH GFR
Anion gap: 9 (ref 5–15)
BUN: 7 mg/dL (ref 6–20)
CO2: 23 mmol/L (ref 22–32)
Calcium: 9.9 mg/dL (ref 8.9–10.3)
Chloride: 104 mmol/L (ref 98–111)
Creatinine, Ser: 0.79 mg/dL (ref 0.44–1.00)
GFR, Estimated: >60 mL/min (ref >60)
Glucose, Bld: 104 mg/dL — ABNORMAL HIGH (ref 70–99)
Potassium: 3.5 mmol/L (ref 3.5–5.1)
Sodium: 136 mmol/L (ref 135–145)

## 2024-06-29 LAB — TROPONIN I (HIGH SENSITIVITY): Troponin I (High Sensitivity): 2 ng/L (ref <18)

## 2024-06-29 MED ORDER — CYCLOBENZAPRINE HCL 10 MG PO TABS: 5.0000 mg | ORAL_TABLET | Freq: Three times a day (TID) | ORAL | 0 refills | Status: DC | PRN

## 2024-06-29 MED ORDER — ESOMEPRAZOLE MAGNESIUM 40 MG PO CPDR: 40.0000 mg | DELAYED_RELEASE_CAPSULE | Freq: Every day | ORAL | 0 refills | Status: DC

## 2024-06-29 NOTE — ED Provider Triage Note (Signed)
 Emergency Medicine Provider Triage Evaluation Note  Katherine Lindsey , a 38 y.o. female  was evaluated in triage.  Pt complains of chest pain.  Review of Systems  Positive: Left CP, left arm and jaw pain Negative: Vomiting, SOB  Physical Exam  BP (!) 149/94 (BP Location: Left Arm)   Pulse 62   Temp 98.5 F (36.9 C) (Oral)   Resp 16   LMP  (LMP Unknown)   SpO2 100%  Gen:   Awake, no distress   Resp:  Normal effort  MSK:   Moves extremities without difficulty  Other:    Medical Decision Making  Medically screening exam initiated at 12:38 PM.  Appropriate orders placed.  Katherine Lindsey was informed that the remainder of the evaluation will be completed by another provider, this initial triage assessment does not replace that evaluation, and the importance of remaining in the ED until their evaluation is complete.  Symptoms since around 2:00 am, constant, no modifying factors. Feels it is related to dealing with her brother all night who was having severe anxiety and panic.    Odell Balls, PA-C 06/29/24 1240

## 2024-06-29 NOTE — ED Triage Notes (Addendum)
 Here by POV from home for CP. Radiates to L arm, shoulder neck and jaw. Constant. Onset 2am. Denies other sx. Rates 7/10. No relief with ibuprofen  400mg  at 0900. Take Depo injection. Alert, NAD, calm, interactive. Seen by EDPA in triage.

## 2024-06-29 NOTE — Discharge Instructions (Addendum)
 As we discussed, your heart enzyme test is normal today  Continue taking Tylenol  Motrin  for pain  I have also considered reflux so please take Nexium  40 mg daily  I have prescribed Flexeril  for muscle spasm  I have also referred you to cardiology for further evaluation  Return to ER if you have worse chest pain or shortness of breath

## 2024-06-29 NOTE — ED Provider Notes (Signed)
 [place] EMERGENCY DEPARTMENT AT [facility] Provider Note   CSN: [id number] Arrival date & time: 06/29/24  1202     Patient presents with: Chest Pain   Katherine Lindsey is a 38 y.o. female who presented with chest pain.  Patient is on Depo injection and had chest pain since last night.  She states that it is left-sided chest pain radiating to the neck and also down the arm.  Patient was seen here about a week ago for similar symptoms and had negative D-dimer and negative troponins.  Patient has seen cardiology several years ago and had an unremarkable echo.  She has no known CAD.  Patient states that she has been anxious recently.  She also was concerned for possible reflux but is not on any PPIs   The history is provided by the patient.       Prior to Admission medications   Medication Sig Start Date End Date Taking? Authorizing Provider  cyclobenzaprine  (FLEXERIL ) 10 MG tablet Take 0.5 tablets (5 mg total) by mouth 3 (three) times daily as needed for muscle spasms. 06/29/24  Yes Patt Alm Macho, MD  esomeprazole  (NEXIUM ) 40 MG capsule Take 1 capsule (40 mg total) by mouth daily. 06/29/24  Yes Patt Alm Macho, MD  medroxyPROGESTERone  (DEPO-PROVERA ) 150 MG/ML injection Inject 150 mg into the muscle every 3 (three) months.    [provider]  thiamine  (VITAMIN B1) 100 MG tablet Take 100 mg by mouth daily.    [provider]    Allergies: Patient has no known allergies.    Review of Systems  Cardiovascular:  Positive for chest pain.  All other systems reviewed and are negative.   Updated Vital Signs BP (!) 156/98 (BP Location: Left Arm)   Pulse 73   Temp 98.7 F (37.1 C) (Oral)   Resp 16   Wt 97.5 kg   LMP  (LMP Unknown)   SpO2 100%   BMI 32.69 kg/m   Physical Exam Vitals and nursing note reviewed.  Constitutional:      Comments: Anxious  HENT:     Head: Normocephalic.  Eyes:     Extraocular Movements: Extraocular movements  intact.     Pupils: Pupils are equal, round, and reactive to light.  Cardiovascular:     Rate and Rhythm: Normal rate and regular rhythm.     Heart sounds: Normal heart sounds.  Pulmonary:     Effort: Pulmonary effort is normal.     Breath sounds: Normal breath sounds.  Abdominal:     General: Bowel sounds are normal.     Palpations: Abdomen is soft.  Musculoskeletal:        General: Normal range of motion.     Cervical back: Normal range of motion and neck supple.  Skin:    General: Skin is warm.     Capillary Refill: Capillary refill takes less than 2 seconds.  Neurological:     General: No focal deficit present.     Mental Status: She is oriented to person, place, and time.  Psychiatric:        Mood and Affect: Mood normal.        Behavior: Behavior normal.     (all labs ordered are listed, but only abnormal results are displayed) Labs Reviewed  BASIC METABOLIC PANEL WITH GFR - Abnormal; Notable for the following components:      Result Value   Glucose, Bld 104 (*)    All other components within normal  limits  CBC WITH DIFFERENTIAL/PLATELET  TROPONIN I (HIGH SENSITIVITY)  TROPONIN I (HIGH SENSITIVITY)    EKG: EKG Interpretation Date/Time:  Monday June 29 2024 12:54:58 EDT Ventricular Rate:  57 PR Interval:  143 QRS Duration:  75 QT Interval:  421 QTC Calculation: 410 R Axis:   77  Text Interpretation: Sinus rhythm Baseline wander in lead(s) I III aVL No significant change since last tracing Confirmed by Patt Alm DEL [phone]) on 06/29/2024 6:00:22 PM  Radiology: DG Chest 2 View Result Date: 06/29/2024 CLINICAL DATA:  Chest pain EXAM: CHEST - 2 VIEW COMPARISON:  One-view x-ray 06/19/2024 and older. FINDINGS: The heart size and mediastinal contours are within normal limits. No consolidation, pneumothorax or effusion. No edema. The visualized skeletal structures are unremarkable. Slight curvature of the spine. IMPRESSION: No acute cardiopulmonary disease.  Electronically Signed   By: Ranell Bring M.D.   On: 06/29/2024 12:58     Procedures   Medications Ordered in the ED - No data to display                                  Medical Decision Making Katherine Lindsey is a 38 y.o. female who presented with chest pain.  Pain is reproducible on exam.  Concern for possible costochondritis.  Also consider reflux as well.  Plan to get CBC and BMP and troponin x 1 (low risk and patient's symptoms since yesterday).  Patient had a negative D-dimer a week ago and I do not think she has PE.   6:25 PM I reviewed patient's labs and they were unremarkable.  Troponin negative.  Will give Flexeril  as needed and start patient on Nexium .  Will refer to cardiology outpatient   Problems Addressed: Chest wall pain: acute illness or injury  Amount and/or Complexity of Data Reviewed Labs: ordered. Decision-making details documented in ED Course. Radiology: ordered and independent interpretation performed. Decision-making details documented in ED Course. ECG/medicine tests: ordered and independent interpretation performed. Decision-making details documented in ED Course.  Risk Prescription drug management.    Final diagnoses:  Chest wall pain    ED Discharge Orders          Ordered    cyclobenzaprine  (FLEXERIL ) 10 MG tablet  3 times daily PRN        06/29/24 1821    Ambulatory referral to Cardiology        06/29/24 1821    esomeprazole  (NEXIUM ) 40 MG capsule  Daily        06/29/24 TRENNA Patt Alm Sherwin, MD 06/29/24 1826

## 2024-06-30 ENCOUNTER — Ambulatory Visit (INDEPENDENT_AMBULATORY_CARE_PROVIDER_SITE_OTHER): Admitting: Family Medicine

## 2024-06-30 ENCOUNTER — Ambulatory Visit (HOSPITAL_COMMUNITY)
Admission: RE | Admit: 2024-06-30 | Discharge: 2024-06-30 | Disposition: A | Discharge status: Home / Self Care | Source: Ambulatory Visit | Attending: Family Medicine | Admitting: Family Medicine

## 2024-06-30 VITALS — BP 129/91 | HR 61 | Ht 68.0 in | Wt 216.0 lb

## 2024-06-30 DIAGNOSIS — R0789 Other chest pain: Secondary | ICD-10-CM | POA: Insufficient documentation

## 2024-06-30 DIAGNOSIS — R7303 Prediabetes: Secondary | ICD-10-CM

## 2024-06-30 LAB — POCT GLYCOSYLATED HEMOGLOBIN (HGB A1C): HbA1c, POC (prediabetic range): 6 % (ref 5.7–6.4)

## 2024-06-30 NOTE — Assessment & Plan Note (Signed)
-  repeat A1c today at patient's request. -advised patient to schedule follow up with her PCP at end of visit today

## 2024-06-30 NOTE — Progress Notes (Signed)
    SUBJECTIVE:   CHIEF COMPLAINT / HPI:   Two episodes of CP, one 7/4, one 7/14. With further review this appears to be an ongoing issue for which she has been seen many times.   Pain starts in her left chest, radiates up into her neck and down her left arm. It can occasionally be accompanied by tingling and numbness. She has previously had low B1/B12 and was supposed to follow up with Dr. Dahbura for further evaluation, but was unable to keep appointment.   Patient is on depo provera  for pregnancy prevention and is UTD on her shot schedule  PERTINENT  PMH / PSH: atypical chest pain, anxiety, numbness and tingling, dyspepsia  OBJECTIVE:   BP (!) 129/91   Pulse 61   Ht 5' 8 (1.727 m)   Wt 216 lb (98 kg)   LMP  (LMP Unknown)   SpO2 100%   BMI 32.84 kg/m   General: A&O, NAD Cardiac: RRR, no m/r/g Respiratory: CTAB, normal WOB, no w/c/r MSK: Spurling test on the left weakly positive. Significant muscular tension in the L shoulder and neck. No loss of strength or range of motion.   ASSESSMENT/PLAN:   Assessment & Plan Atypical chest pain Reassuring that patient has had extensive prior cardiac workup with no abnormalities, and continues to have normal EKG and troponins.  -repeat B1, B12 and CMP as per plan with Dr. Orlando at patient request -DG cervical spine to evaluate for nerve root impingement -referral to PT Prediabetes -repeat A1c today at patient's request. -advised patient to schedule follow up with her PCP at end of visit today   Katherine Pinal, DO [facility]

## 2024-06-30 NOTE — Assessment & Plan Note (Signed)
 Reassuring that patient has had extensive prior cardiac workup with no abnormalities, and continues to have normal EKG and troponins.  -repeat B1, B12 and CMP as per plan with Dr. Orlando at patient request -DG cervical spine to evaluate for nerve root impingement -referral to PT

## 2024-06-30 NOTE — Patient Instructions (Addendum)
 It was wonderful to see you today!  Your neck/shoulder pain may be caused by a nerve impingement, so I would like to get an xray today. I will place the order, and once I have the results I will give you a call to discuss. I would also like you to see physical therapy to work on the pain and dysfunction in that shoulder. That referral has been sent, and they will call you with details.   Please schedule a physical with your PCP on the way out today.  Please call [phone] with any questions about today's appointment.   If you need any additional refills, please call your pharmacy before calling the office.  Lucie Pinal, DO Family Medicine

## 2024-07-01 ENCOUNTER — Ambulatory Visit: Payer: Self-pay | Admitting: Family Medicine

## 2024-07-03 LAB — CMP14+EGFR
ALT: 24 IU/L (ref 0–32)
AST: 16 IU/L (ref 0–40)
Albumin: 4.6 g/dL (ref 3.9–4.9)
Alkaline Phosphatase: 101 IU/L (ref 44–121)
BUN/Creatinine Ratio: 12 (ref 9–23)
BUN: 10 mg/dL (ref 6–20)
Bilirubin Total: <0.2 mg/dL (ref 0.0–1.2)
CO2: 19 mmol/L — ABNORMAL LOW (ref 20–29)
Calcium: 9.6 mg/dL (ref 8.7–10.2)
Chloride: 105 mmol/L (ref 96–106)
Creatinine, Ser: 0.86 mg/dL (ref 0.57–1.00)
Globulin, Total: 3.1 g/dL (ref 1.5–4.5)
Glucose: 139 mg/dL — ABNORMAL HIGH (ref 70–99)
Potassium: 4.3 mmol/L (ref 3.5–5.2)
Sodium: 141 mmol/L (ref 134–144)
Total Protein: 7.7 g/dL (ref 6.0–8.5)
eGFR: 89 mL/min/1.73 (ref >59)

## 2024-07-03 LAB — VITAMIN B12: Vitamin B-12: 427 pg/mL (ref 232–1245)

## 2024-07-03 LAB — VITAMIN B1: Thiamine: 84 nmol/L (ref 66.5–200.0)

## 2024-08-03 ENCOUNTER — Ambulatory Visit

## 2024-08-03 NOTE — Progress Notes (Deleted)
 Katherine Lindsey here for Depo-Provera  Injection. Injection administered without complication. Patient will return in 3 months for next injection between 10/19/2024 and 11/02/2024. Next annual visit due 01/17/2024.

## 2024-08-13 ENCOUNTER — Other Ambulatory Visit: Payer: Self-pay

## 2024-08-13 ENCOUNTER — Ambulatory Visit (INDEPENDENT_AMBULATORY_CARE_PROVIDER_SITE_OTHER): Admitting: General Practice

## 2024-08-13 VITALS — BP 138/89 | HR 71 | Ht 68.0 in | Wt 216.0 lb

## 2024-08-13 DIAGNOSIS — Z3042 Encounter for surveillance of injectable contraceptive: Secondary | ICD-10-CM | POA: Diagnosis not present

## 2024-08-13 MED ORDER — MEDROXYPROGESTERONE ACETATE 150 MG/ML IM SUSY
150.0000 mg | PREFILLED_SYRINGE | Freq: Once | INTRAMUSCULAR | Status: AC
Start: 2024-08-13 — End: 2024-08-13
  Administered 2024-08-13: 150 mg via INTRAMUSCULAR

## 2024-08-13 NOTE — Progress Notes (Signed)
 Katherine Lindsey here for Depo-Provera  Injection. Injection administered without complication. Patient will return in 3 months for next injection between 10/29/2024 and 11/12/2024. Next annual visit due 01/16/2025.   Elenor DEL RN BSN 08/13/24

## 2024-08-14 ENCOUNTER — Encounter

## 2024-10-29 ENCOUNTER — Ambulatory Visit

## 2024-11-04 ENCOUNTER — Other Ambulatory Visit: Payer: Self-pay

## 2024-11-04 ENCOUNTER — Ambulatory Visit

## 2024-11-04 VITALS — BP 138/83 | HR 74 | Ht 68.0 in | Wt 215.8 lb

## 2024-11-04 DIAGNOSIS — Z3042 Encounter for surveillance of injectable contraceptive: Secondary | ICD-10-CM | POA: Diagnosis not present

## 2024-11-04 MED ORDER — MEDROXYPROGESTERONE ACETATE 150 MG/ML IM SUSP
150.0000 mg | Freq: Once | INTRAMUSCULAR | Status: AC
  Administered 2024-11-04: 150 mg via INTRAMUSCULAR

## 2024-11-04 NOTE — Progress Notes (Signed)
 Katherine Lindsey here for Depo-Provera  Injection. Last injection administered 08/13/24. Injection administered today without complication. Patient states no questions or concerns. Patient will return in 3 months for next injection between 01/20/25 and 02/03/25.   Devon, RN 11/04/2024

## 2025-01-20 ENCOUNTER — Ambulatory Visit

## 2025-01-28 ENCOUNTER — Ambulatory Visit
